# Patient Record
Sex: Female | Born: 1945 | Race: Black or African American | Hispanic: No | Marital: Single | Smoking: Current every day smoker
Health system: Southern US, Community
[De-identification: ages and names within clinical notes are randomized; demographics above are authoritative.]

## PROBLEM LIST (undated history)

## (undated) DIAGNOSIS — E119 Type 2 diabetes mellitus without complications: Secondary | ICD-10-CM

## (undated) DIAGNOSIS — D689 Coagulation defect, unspecified: Secondary | ICD-10-CM

## (undated) DIAGNOSIS — Z87891 Personal history of nicotine dependence: Secondary | ICD-10-CM

## (undated) DIAGNOSIS — D509 Iron deficiency anemia, unspecified: Secondary | ICD-10-CM

## (undated) DIAGNOSIS — I10 Essential (primary) hypertension: Secondary | ICD-10-CM

## (undated) DIAGNOSIS — G5603 Carpal tunnel syndrome, bilateral upper limbs: Secondary | ICD-10-CM

## (undated) DIAGNOSIS — G2581 Restless legs syndrome: Secondary | ICD-10-CM

## (undated) DIAGNOSIS — M48061 Spinal stenosis, lumbar region without neurogenic claudication: Secondary | ICD-10-CM

## (undated) DIAGNOSIS — J45909 Unspecified asthma, uncomplicated: Secondary | ICD-10-CM

## (undated) DIAGNOSIS — I700 Atherosclerosis of aorta: Secondary | ICD-10-CM

## (undated) DIAGNOSIS — E669 Obesity, unspecified: Secondary | ICD-10-CM

## (undated) DIAGNOSIS — K449 Diaphragmatic hernia without obstruction or gangrene: Secondary | ICD-10-CM

## (undated) DIAGNOSIS — M1990 Unspecified osteoarthritis, unspecified site: Secondary | ICD-10-CM

## (undated) DIAGNOSIS — N179 Acute kidney failure, unspecified: Secondary | ICD-10-CM

## (undated) DIAGNOSIS — K5790 Diverticulosis of intestine, part unspecified, without perforation or abscess without bleeding: Secondary | ICD-10-CM

## (undated) DIAGNOSIS — J449 Chronic obstructive pulmonary disease, unspecified: Secondary | ICD-10-CM

## (undated) DIAGNOSIS — M5137 Other intervertebral disc degeneration, lumbosacral region: Secondary | ICD-10-CM

## (undated) DIAGNOSIS — G629 Polyneuropathy, unspecified: Secondary | ICD-10-CM

## (undated) DIAGNOSIS — H409 Unspecified glaucoma: Secondary | ICD-10-CM

## (undated) DIAGNOSIS — F32A Depression, unspecified: Secondary | ICD-10-CM

## (undated) DIAGNOSIS — K219 Gastro-esophageal reflux disease without esophagitis: Secondary | ICD-10-CM

## (undated) DIAGNOSIS — Z8719 Personal history of other diseases of the digestive system: Secondary | ICD-10-CM

## (undated) DIAGNOSIS — G4733 Obstructive sleep apnea (adult) (pediatric): Secondary | ICD-10-CM

## (undated) DIAGNOSIS — K648 Other hemorrhoids: Secondary | ICD-10-CM

## (undated) DIAGNOSIS — F1190 Opioid use, unspecified, uncomplicated: Secondary | ICD-10-CM

## (undated) DIAGNOSIS — D126 Benign neoplasm of colon, unspecified: Secondary | ICD-10-CM

## (undated) DIAGNOSIS — M51379 Other intervertebral disc degeneration, lumbosacral region without mention of lumbar back pain or lower extremity pain: Secondary | ICD-10-CM

## (undated) DIAGNOSIS — I2510 Atherosclerotic heart disease of native coronary artery without angina pectoris: Secondary | ICD-10-CM

## (undated) DIAGNOSIS — G43909 Migraine, unspecified, not intractable, without status migrainosus: Secondary | ICD-10-CM

## (undated) DIAGNOSIS — I444 Left anterior fascicular block: Secondary | ICD-10-CM

## (undated) DIAGNOSIS — K649 Unspecified hemorrhoids: Secondary | ICD-10-CM

## (undated) DIAGNOSIS — E538 Deficiency of other specified B group vitamins: Secondary | ICD-10-CM

## (undated) DIAGNOSIS — I499 Cardiac arrhythmia, unspecified: Secondary | ICD-10-CM

## (undated) DIAGNOSIS — E1142 Type 2 diabetes mellitus with diabetic polyneuropathy: Secondary | ICD-10-CM

## (undated) DIAGNOSIS — B019 Varicella without complication: Secondary | ICD-10-CM

## (undated) DIAGNOSIS — G4700 Insomnia, unspecified: Secondary | ICD-10-CM

## (undated) DIAGNOSIS — K760 Fatty (change of) liver, not elsewhere classified: Secondary | ICD-10-CM

## (undated) DIAGNOSIS — Z9889 Other specified postprocedural states: Secondary | ICD-10-CM

## (undated) DIAGNOSIS — E785 Hyperlipidemia, unspecified: Secondary | ICD-10-CM

## (undated) DIAGNOSIS — Z87442 Personal history of urinary calculi: Secondary | ICD-10-CM

## (undated) DIAGNOSIS — G4730 Sleep apnea, unspecified: Secondary | ICD-10-CM

## (undated) DIAGNOSIS — I4891 Unspecified atrial fibrillation: Secondary | ICD-10-CM

## (undated) DIAGNOSIS — F329 Major depressive disorder, single episode, unspecified: Secondary | ICD-10-CM

## (undated) DIAGNOSIS — R0609 Other forms of dyspnea: Secondary | ICD-10-CM

## (undated) DIAGNOSIS — I6789 Other cerebrovascular disease: Secondary | ICD-10-CM

## (undated) DIAGNOSIS — Z7982 Long term (current) use of aspirin: Secondary | ICD-10-CM

## (undated) HISTORY — PX: ABDOMINAL HYSTERECTOMY: SHX81

## (undated) HISTORY — DX: Opioid use, unspecified, uncomplicated: F11.90

## (undated) HISTORY — PX: JOINT REPLACEMENT: SHX530

## (undated) HISTORY — PX: EYE SURGERY: SHX253

## (undated) HISTORY — PX: LUMBAR FUSION: SHX111

## (undated) HISTORY — DX: Personal history of nicotine dependence: Z87.891

## (undated) HISTORY — PX: COLONOSCOPY WITH ESOPHAGOGASTRODUODENOSCOPY (EGD): SHX5779

## (undated) HISTORY — PX: CHOLECYSTECTOMY: SHX55

## (undated) HISTORY — PX: TOTAL KNEE ARTHROPLASTY: SHX125

## (undated) HISTORY — PX: OTHER SURGICAL HISTORY: SHX169

## (undated) HISTORY — PX: APPENDECTOMY: SHX54

## (undated) HISTORY — PX: CATARACT EXTRACTION: SUR2

## (undated) HISTORY — PX: BACK SURGERY: SHX140

## (undated) HISTORY — PX: CARPAL TUNNEL RELEASE: SHX101

---
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
## 1980-10-06 HISTORY — PX: ABDOMINAL HYSTERECTOMY: SHX81

## 1997-10-06 HISTORY — PX: TOTAL KNEE ARTHROPLASTY: SHX125

## 2000-10-06 HISTORY — PX: BACK SURGERY: SHX140

## 2000-12-28 ENCOUNTER — Encounter: Payer: Self-pay | Admitting: Neurosurgery

## 2000-12-29 ENCOUNTER — Ambulatory Visit: Admission: RE | Admit: 2000-12-29 | Discharge: 2000-12-29 | Payer: Self-pay | Admitting: Neurosurgery

## 2001-02-22 ENCOUNTER — Encounter: Payer: Self-pay | Admitting: Neurosurgery

## 2001-02-22 ENCOUNTER — Inpatient Hospital Stay (HOSPITAL_COMMUNITY): Admission: RE | Admit: 2001-02-22 | Discharge: 2001-02-23 | Payer: Self-pay | Admitting: Neurosurgery

## 2003-09-12 ENCOUNTER — Other Ambulatory Visit: Payer: Self-pay

## 2004-01-15 ENCOUNTER — Other Ambulatory Visit: Payer: Self-pay

## 2004-07-18 ENCOUNTER — Ambulatory Visit: Payer: Self-pay | Admitting: Pain Medicine

## 2004-08-05 ENCOUNTER — Ambulatory Visit: Payer: Self-pay | Admitting: Pain Medicine

## 2004-08-15 ENCOUNTER — Ambulatory Visit: Payer: Self-pay | Admitting: Pain Medicine

## 2004-08-19 ENCOUNTER — Ambulatory Visit: Payer: Self-pay | Admitting: Pain Medicine

## 2004-09-26 ENCOUNTER — Ambulatory Visit: Payer: Self-pay | Admitting: Pain Medicine

## 2004-10-06 HISTORY — PX: CATARACT EXTRACTION: SUR2

## 2004-10-06 HISTORY — PX: LUMBAR FUSION: SHX111

## 2004-10-09 ENCOUNTER — Ambulatory Visit: Payer: Self-pay | Admitting: Pain Medicine

## 2004-10-24 ENCOUNTER — Ambulatory Visit: Payer: Self-pay | Admitting: Pain Medicine

## 2004-11-21 ENCOUNTER — Ambulatory Visit: Payer: Self-pay | Admitting: Pain Medicine

## 2004-11-27 ENCOUNTER — Ambulatory Visit: Payer: Self-pay | Admitting: Pain Medicine

## 2004-12-17 ENCOUNTER — Ambulatory Visit: Payer: Self-pay | Admitting: Pain Medicine

## 2005-01-16 ENCOUNTER — Ambulatory Visit: Payer: Self-pay | Admitting: Pain Medicine

## 2005-01-27 ENCOUNTER — Ambulatory Visit: Payer: Self-pay | Admitting: Pain Medicine

## 2005-02-03 ENCOUNTER — Ambulatory Visit: Payer: Self-pay | Admitting: Physician Assistant

## 2005-02-20 ENCOUNTER — Ambulatory Visit: Payer: Self-pay | Admitting: Pain Medicine

## 2005-02-24 ENCOUNTER — Ambulatory Visit: Payer: Self-pay | Admitting: Pain Medicine

## 2005-03-24 ENCOUNTER — Ambulatory Visit: Payer: Self-pay | Admitting: Pain Medicine

## 2005-04-21 ENCOUNTER — Other Ambulatory Visit: Payer: Self-pay

## 2005-04-22 ENCOUNTER — Ambulatory Visit: Payer: Self-pay | Admitting: Pain Medicine

## 2005-05-20 ENCOUNTER — Ambulatory Visit: Payer: Self-pay | Admitting: Pain Medicine

## 2005-05-28 ENCOUNTER — Inpatient Hospital Stay: Payer: Self-pay | Admitting: Unknown Physician Specialty

## 2005-06-26 ENCOUNTER — Ambulatory Visit: Payer: Self-pay | Admitting: Pain Medicine

## 2005-07-02 ENCOUNTER — Ambulatory Visit: Payer: Self-pay | Admitting: Pain Medicine

## 2005-07-17 ENCOUNTER — Ambulatory Visit: Payer: Self-pay | Admitting: Internal Medicine

## 2005-07-22 ENCOUNTER — Ambulatory Visit: Payer: Self-pay | Admitting: Pain Medicine

## 2005-07-30 ENCOUNTER — Ambulatory Visit: Payer: Self-pay | Admitting: Pain Medicine

## 2005-08-19 ENCOUNTER — Ambulatory Visit: Payer: Self-pay | Admitting: Pain Medicine

## 2005-09-15 ENCOUNTER — Ambulatory Visit: Payer: Self-pay | Admitting: Pain Medicine

## 2005-10-16 ENCOUNTER — Ambulatory Visit: Payer: Self-pay | Admitting: Pain Medicine

## 2005-11-10 ENCOUNTER — Ambulatory Visit: Payer: Self-pay | Admitting: Pain Medicine

## 2005-12-16 ENCOUNTER — Ambulatory Visit: Payer: Self-pay | Admitting: Pain Medicine

## 2006-01-20 ENCOUNTER — Ambulatory Visit: Payer: Self-pay | Admitting: Pain Medicine

## 2006-01-24 ENCOUNTER — Ambulatory Visit: Payer: Self-pay | Admitting: Pain Medicine

## 2006-01-26 ENCOUNTER — Ambulatory Visit: Payer: Self-pay | Admitting: Pain Medicine

## 2006-02-17 ENCOUNTER — Ambulatory Visit: Payer: Self-pay | Admitting: Pain Medicine

## 2006-02-23 ENCOUNTER — Ambulatory Visit: Payer: Self-pay | Admitting: Pain Medicine

## 2006-03-19 ENCOUNTER — Ambulatory Visit: Payer: Self-pay | Admitting: Pain Medicine

## 2006-03-27 ENCOUNTER — Ambulatory Visit: Payer: Self-pay | Admitting: General Surgery

## 2006-04-02 ENCOUNTER — Ambulatory Visit: Payer: Self-pay | Admitting: General Surgery

## 2006-04-12 ENCOUNTER — Emergency Department: Payer: Self-pay | Admitting: Emergency Medicine

## 2006-04-15 ENCOUNTER — Ambulatory Visit: Payer: Self-pay | Admitting: Pain Medicine

## 2006-05-13 ENCOUNTER — Ambulatory Visit: Payer: Self-pay | Admitting: Pain Medicine

## 2006-06-18 ENCOUNTER — Ambulatory Visit: Payer: Self-pay | Admitting: Pain Medicine

## 2006-07-02 ENCOUNTER — Ambulatory Visit: Payer: Self-pay | Admitting: Gastroenterology

## 2006-07-16 ENCOUNTER — Ambulatory Visit: Payer: Self-pay | Admitting: Pain Medicine

## 2006-07-27 ENCOUNTER — Ambulatory Visit: Payer: Self-pay | Admitting: Pain Medicine

## 2006-07-29 ENCOUNTER — Ambulatory Visit: Payer: Self-pay | Admitting: Gastroenterology

## 2006-08-11 ENCOUNTER — Ambulatory Visit: Payer: Self-pay | Admitting: Pain Medicine

## 2006-08-19 ENCOUNTER — Ambulatory Visit: Payer: Self-pay | Admitting: Pain Medicine

## 2006-09-09 ENCOUNTER — Ambulatory Visit: Payer: Self-pay | Admitting: Pain Medicine

## 2006-10-13 ENCOUNTER — Ambulatory Visit: Payer: Self-pay | Admitting: Pain Medicine

## 2006-11-12 ENCOUNTER — Ambulatory Visit: Payer: Self-pay | Admitting: Pain Medicine

## 2006-11-16 ENCOUNTER — Ambulatory Visit: Payer: Self-pay | Admitting: Pain Medicine

## 2006-12-08 ENCOUNTER — Ambulatory Visit: Payer: Self-pay | Admitting: Pain Medicine

## 2006-12-16 ENCOUNTER — Ambulatory Visit: Payer: Self-pay | Admitting: Pain Medicine

## 2006-12-31 ENCOUNTER — Ambulatory Visit: Payer: Self-pay | Admitting: Pain Medicine

## 2007-01-11 ENCOUNTER — Ambulatory Visit: Payer: Self-pay | Admitting: Pain Medicine

## 2007-01-28 ENCOUNTER — Ambulatory Visit: Payer: Self-pay | Admitting: Pain Medicine

## 2007-02-01 ENCOUNTER — Ambulatory Visit: Payer: Self-pay | Admitting: Pain Medicine

## 2007-03-02 ENCOUNTER — Ambulatory Visit: Payer: Self-pay | Admitting: Pain Medicine

## 2007-03-10 ENCOUNTER — Ambulatory Visit: Payer: Self-pay | Admitting: Pain Medicine

## 2007-04-01 ENCOUNTER — Ambulatory Visit: Payer: Self-pay | Admitting: Pain Medicine

## 2007-04-07 ENCOUNTER — Ambulatory Visit: Payer: Self-pay | Admitting: Pain Medicine

## 2007-05-05 ENCOUNTER — Ambulatory Visit: Payer: Self-pay | Admitting: Pain Medicine

## 2007-05-10 ENCOUNTER — Ambulatory Visit: Payer: Self-pay | Admitting: Pain Medicine

## 2007-05-17 ENCOUNTER — Ambulatory Visit: Payer: Self-pay | Admitting: Unknown Physician Specialty

## 2007-06-03 ENCOUNTER — Ambulatory Visit: Payer: Self-pay | Admitting: Pain Medicine

## 2007-06-18 ENCOUNTER — Ambulatory Visit: Payer: Self-pay | Admitting: Pain Medicine

## 2007-07-06 ENCOUNTER — Other Ambulatory Visit: Payer: Self-pay

## 2007-07-06 ENCOUNTER — Ambulatory Visit: Payer: Self-pay | Admitting: Unknown Physician Specialty

## 2007-07-07 ENCOUNTER — Ambulatory Visit: Payer: Self-pay | Admitting: Pain Medicine

## 2007-07-12 ENCOUNTER — Inpatient Hospital Stay: Payer: Self-pay | Admitting: Unknown Physician Specialty

## 2007-08-04 ENCOUNTER — Ambulatory Visit: Payer: Self-pay | Admitting: Pain Medicine

## 2007-08-30 ENCOUNTER — Ambulatory Visit: Payer: Self-pay | Admitting: Pain Medicine

## 2007-09-27 ENCOUNTER — Ambulatory Visit: Payer: Self-pay | Admitting: Pain Medicine

## 2007-11-04 ENCOUNTER — Ambulatory Visit: Payer: Self-pay | Admitting: Pain Medicine

## 2007-12-01 ENCOUNTER — Ambulatory Visit: Payer: Self-pay | Admitting: Pain Medicine

## 2007-12-14 ENCOUNTER — Ambulatory Visit: Payer: Self-pay

## 2007-12-23 ENCOUNTER — Ambulatory Visit: Payer: Self-pay | Admitting: Pain Medicine

## 2007-12-29 ENCOUNTER — Ambulatory Visit: Payer: Self-pay | Admitting: Pain Medicine

## 2008-02-24 ENCOUNTER — Ambulatory Visit: Payer: Self-pay | Admitting: Pain Medicine

## 2008-03-01 ENCOUNTER — Ambulatory Visit: Payer: Self-pay | Admitting: Pain Medicine

## 2008-03-23 ENCOUNTER — Ambulatory Visit: Payer: Self-pay | Admitting: Pain Medicine

## 2008-03-27 ENCOUNTER — Ambulatory Visit: Payer: Self-pay | Admitting: Pain Medicine

## 2008-04-25 ENCOUNTER — Ambulatory Visit: Payer: Self-pay | Admitting: Pain Medicine

## 2008-05-03 ENCOUNTER — Ambulatory Visit: Payer: Self-pay | Admitting: Pain Medicine

## 2008-05-25 ENCOUNTER — Ambulatory Visit: Payer: Self-pay | Admitting: Pain Medicine

## 2008-05-31 ENCOUNTER — Ambulatory Visit: Payer: Self-pay | Admitting: Pain Medicine

## 2008-06-22 ENCOUNTER — Ambulatory Visit: Payer: Self-pay | Admitting: Pain Medicine

## 2008-07-19 ENCOUNTER — Ambulatory Visit: Payer: Self-pay | Admitting: Pain Medicine

## 2008-08-22 ENCOUNTER — Ambulatory Visit: Payer: Self-pay | Admitting: Pain Medicine

## 2008-08-28 ENCOUNTER — Ambulatory Visit: Payer: Self-pay | Admitting: Pain Medicine

## 2008-09-19 ENCOUNTER — Ambulatory Visit: Payer: Self-pay | Admitting: Family Medicine

## 2008-09-21 ENCOUNTER — Ambulatory Visit: Payer: Self-pay | Admitting: Pain Medicine

## 2008-10-24 ENCOUNTER — Ambulatory Visit: Payer: Self-pay | Admitting: Pain Medicine

## 2008-10-30 ENCOUNTER — Ambulatory Visit: Payer: Self-pay | Admitting: Pain Medicine

## 2008-11-28 ENCOUNTER — Ambulatory Visit: Payer: Self-pay | Admitting: Pain Medicine

## 2008-12-28 ENCOUNTER — Ambulatory Visit: Payer: Self-pay | Admitting: Pain Medicine

## 2009-01-30 ENCOUNTER — Ambulatory Visit: Payer: Self-pay | Admitting: Pain Medicine

## 2009-02-12 ENCOUNTER — Ambulatory Visit: Payer: Self-pay | Admitting: Pain Medicine

## 2009-03-01 ENCOUNTER — Ambulatory Visit: Payer: Self-pay | Admitting: Pain Medicine

## 2009-03-27 ENCOUNTER — Ambulatory Visit: Payer: Self-pay | Admitting: Pain Medicine

## 2009-04-04 ENCOUNTER — Ambulatory Visit: Payer: Self-pay | Admitting: Pain Medicine

## 2009-04-24 ENCOUNTER — Ambulatory Visit: Payer: Self-pay | Admitting: Pain Medicine

## 2009-05-29 ENCOUNTER — Ambulatory Visit: Payer: Self-pay | Admitting: Pain Medicine

## 2009-06-28 ENCOUNTER — Ambulatory Visit: Payer: Self-pay | Admitting: Pain Medicine

## 2009-07-04 ENCOUNTER — Ambulatory Visit: Payer: Self-pay | Admitting: Pain Medicine

## 2009-07-24 ENCOUNTER — Ambulatory Visit: Payer: Self-pay | Admitting: Pain Medicine

## 2009-08-20 ENCOUNTER — Ambulatory Visit: Payer: Self-pay | Admitting: Pain Medicine

## 2009-09-20 ENCOUNTER — Ambulatory Visit: Payer: Self-pay | Admitting: Pain Medicine

## 2009-09-24 ENCOUNTER — Ambulatory Visit: Payer: Self-pay | Admitting: Pain Medicine

## 2009-10-23 ENCOUNTER — Ambulatory Visit: Payer: Self-pay | Admitting: Pain Medicine

## 2009-11-22 ENCOUNTER — Ambulatory Visit: Payer: Self-pay | Admitting: Pain Medicine

## 2009-11-28 ENCOUNTER — Ambulatory Visit: Payer: Self-pay | Admitting: Pain Medicine

## 2009-12-20 ENCOUNTER — Ambulatory Visit: Payer: Self-pay | Admitting: Pain Medicine

## 2010-01-17 ENCOUNTER — Ambulatory Visit: Payer: Self-pay | Admitting: Pain Medicine

## 2010-01-30 ENCOUNTER — Ambulatory Visit: Payer: Self-pay | Admitting: Pain Medicine

## 2010-02-14 ENCOUNTER — Ambulatory Visit: Payer: Self-pay | Admitting: Pain Medicine

## 2010-03-14 ENCOUNTER — Ambulatory Visit: Payer: Self-pay | Admitting: Pain Medicine

## 2010-04-16 ENCOUNTER — Ambulatory Visit: Payer: Self-pay | Admitting: Pain Medicine

## 2010-04-22 ENCOUNTER — Ambulatory Visit: Payer: Self-pay | Admitting: Pain Medicine

## 2010-05-16 ENCOUNTER — Ambulatory Visit: Payer: Self-pay | Admitting: Pain Medicine

## 2010-06-11 ENCOUNTER — Ambulatory Visit: Payer: Self-pay | Admitting: Pain Medicine

## 2010-06-19 ENCOUNTER — Ambulatory Visit: Payer: Self-pay | Admitting: Pain Medicine

## 2010-07-04 ENCOUNTER — Ambulatory Visit: Payer: Self-pay | Admitting: Pain Medicine

## 2010-08-13 ENCOUNTER — Ambulatory Visit: Payer: Self-pay | Admitting: Pain Medicine

## 2010-08-21 ENCOUNTER — Ambulatory Visit: Payer: Self-pay | Admitting: Pain Medicine

## 2010-09-10 ENCOUNTER — Ambulatory Visit: Payer: Self-pay | Admitting: Pain Medicine

## 2010-09-15 ENCOUNTER — Ambulatory Visit: Payer: Self-pay | Admitting: Pain Medicine

## 2010-10-14 ENCOUNTER — Ambulatory Visit: Payer: Self-pay | Admitting: Pain Medicine

## 2010-10-30 ENCOUNTER — Ambulatory Visit: Payer: Self-pay | Admitting: Pain Medicine

## 2010-12-10 ENCOUNTER — Ambulatory Visit: Payer: Self-pay | Admitting: Pain Medicine

## 2011-01-09 ENCOUNTER — Ambulatory Visit: Payer: Self-pay | Admitting: Pain Medicine

## 2011-02-11 ENCOUNTER — Ambulatory Visit: Payer: Self-pay | Admitting: Pain Medicine

## 2011-02-24 ENCOUNTER — Ambulatory Visit: Payer: Self-pay | Admitting: Emergency Medicine

## 2011-02-25 ENCOUNTER — Ambulatory Visit: Payer: Self-pay | Admitting: Emergency Medicine

## 2011-02-26 ENCOUNTER — Ambulatory Visit: Payer: Self-pay | Admitting: Emergency Medicine

## 2011-03-11 ENCOUNTER — Ambulatory Visit: Payer: Self-pay | Admitting: Pain Medicine

## 2011-03-23 ENCOUNTER — Emergency Department: Payer: Self-pay | Admitting: Emergency Medicine

## 2011-03-29 ENCOUNTER — Emergency Department: Payer: Self-pay | Admitting: Internal Medicine

## 2011-04-08 ENCOUNTER — Ambulatory Visit: Payer: Self-pay | Admitting: Pain Medicine

## 2011-05-08 ENCOUNTER — Ambulatory Visit: Payer: Self-pay | Admitting: Pain Medicine

## 2011-06-05 ENCOUNTER — Ambulatory Visit: Payer: Self-pay | Admitting: Pain Medicine

## 2011-07-08 ENCOUNTER — Ambulatory Visit: Payer: Self-pay | Admitting: Pain Medicine

## 2011-08-07 ENCOUNTER — Ambulatory Visit: Payer: Self-pay | Admitting: Pain Medicine

## 2011-08-20 ENCOUNTER — Ambulatory Visit: Payer: Self-pay | Admitting: Cardiovascular Disease

## 2011-09-09 ENCOUNTER — Ambulatory Visit: Payer: Self-pay | Admitting: Pain Medicine

## 2011-09-17 ENCOUNTER — Ambulatory Visit: Payer: Self-pay | Admitting: Pain Medicine

## 2011-10-09 ENCOUNTER — Ambulatory Visit: Payer: Self-pay | Admitting: Pain Medicine

## 2011-11-04 ENCOUNTER — Ambulatory Visit: Payer: Self-pay | Admitting: Pain Medicine

## 2011-12-02 ENCOUNTER — Ambulatory Visit: Payer: Self-pay | Admitting: Pain Medicine

## 2011-12-30 ENCOUNTER — Ambulatory Visit: Payer: Self-pay | Admitting: Pain Medicine

## 2012-02-03 ENCOUNTER — Ambulatory Visit: Payer: Self-pay | Admitting: Pain Medicine

## 2012-02-11 ENCOUNTER — Ambulatory Visit: Payer: Self-pay | Admitting: Pain Medicine

## 2012-03-04 ENCOUNTER — Ambulatory Visit: Payer: Self-pay | Admitting: Pain Medicine

## 2012-03-30 ENCOUNTER — Ambulatory Visit: Payer: Self-pay | Admitting: Pain Medicine

## 2012-04-29 ENCOUNTER — Ambulatory Visit: Payer: Self-pay | Admitting: Pain Medicine

## 2012-06-01 ENCOUNTER — Ambulatory Visit: Payer: Self-pay | Admitting: Pain Medicine

## 2012-06-14 ENCOUNTER — Ambulatory Visit: Payer: Self-pay | Admitting: Pain Medicine

## 2012-06-30 ENCOUNTER — Ambulatory Visit: Payer: Self-pay | Admitting: Pain Medicine

## 2012-08-03 ENCOUNTER — Ambulatory Visit: Payer: Self-pay | Admitting: Pain Medicine

## 2012-08-31 ENCOUNTER — Ambulatory Visit: Payer: Self-pay | Admitting: Pain Medicine

## 2012-09-27 ENCOUNTER — Ambulatory Visit: Payer: Self-pay | Admitting: Pain Medicine

## 2012-10-26 ENCOUNTER — Ambulatory Visit: Payer: Self-pay | Admitting: Pain Medicine

## 2012-11-29 ENCOUNTER — Ambulatory Visit: Payer: Self-pay | Admitting: Pain Medicine

## 2012-12-15 ENCOUNTER — Ambulatory Visit: Payer: Self-pay | Admitting: Pain Medicine

## 2013-01-27 ENCOUNTER — Ambulatory Visit: Payer: Self-pay | Admitting: Pain Medicine

## 2013-03-01 ENCOUNTER — Ambulatory Visit: Payer: Self-pay | Admitting: Pain Medicine

## 2013-03-31 ENCOUNTER — Ambulatory Visit: Payer: Self-pay | Admitting: Pain Medicine

## 2013-04-28 ENCOUNTER — Ambulatory Visit: Payer: Self-pay | Admitting: Pain Medicine

## 2013-05-26 ENCOUNTER — Ambulatory Visit: Payer: Self-pay | Admitting: Pain Medicine

## 2013-06-28 ENCOUNTER — Ambulatory Visit: Payer: Self-pay | Admitting: Pain Medicine

## 2013-07-06 ENCOUNTER — Ambulatory Visit: Payer: Self-pay | Admitting: Gastroenterology

## 2013-07-14 ENCOUNTER — Ambulatory Visit: Payer: Self-pay | Admitting: Gastroenterology

## 2013-07-21 ENCOUNTER — Ambulatory Visit: Payer: Self-pay | Admitting: Oncology

## 2013-08-01 LAB — CBC CANCER CENTER
Basophil #: 0.1 x10 3/mm (ref 0.0–0.1)
Basophil %: 0.9 %
Eosinophil #: 0.2 x10 3/mm (ref 0.0–0.7)
Eosinophil %: 2.2 %
HCT: 34.2 % — ABNORMAL LOW (ref 35.0–47.0)
HGB: 11.1 g/dL — ABNORMAL LOW (ref 12.0–16.0)
Lymphocyte #: 2.2 x10 3/mm (ref 1.0–3.6)
Lymphocyte %: 29.5 %
MCH: 25.3 pg — ABNORMAL LOW (ref 26.0–34.0)
MCHC: 32.3 g/dL (ref 32.0–36.0)
MCV: 78 fL — ABNORMAL LOW (ref 80–100)
Monocyte #: 0.4 x10 3/mm (ref 0.2–0.9)
Monocyte %: 5 %
Neutrophil #: 4.7 x10 3/mm (ref 1.4–6.5)
Neutrophil %: 62.4 %
Platelet: 243 x10 3/mm (ref 150–440)
RBC: 4.38 10*6/uL (ref 3.80–5.20)
RDW: 15.4 % — ABNORMAL HIGH (ref 11.5–14.5)
WBC: 7.6 x10 3/mm (ref 3.6–11.0)

## 2013-08-01 LAB — COMPREHENSIVE METABOLIC PANEL
Albumin: 2.9 g/dL — ABNORMAL LOW (ref 3.4–5.0)
Alkaline Phosphatase: 102 U/L (ref 50–136)
Anion Gap: 6 — ABNORMAL LOW (ref 7–16)
BUN: 4 mg/dL — ABNORMAL LOW (ref 7–18)
Bilirubin,Total: 0.5 mg/dL (ref 0.2–1.0)
Calcium, Total: 8.3 mg/dL — ABNORMAL LOW (ref 8.5–10.1)
Chloride: 104 mmol/L (ref 98–107)
Co2: 30 mmol/L (ref 21–32)
Creatinine: 0.78 mg/dL (ref 0.60–1.30)
EGFR (African American): >60
EGFR (Non-African Amer.): >60
Glucose: 119 mg/dL — ABNORMAL HIGH (ref 65–99)
Osmolality: 277 (ref 275–301)
Potassium: 3.6 mmol/L (ref 3.5–5.1)
SGOT(AST): 14 U/L — ABNORMAL LOW (ref 15–37)
SGPT (ALT): 8 U/L — ABNORMAL LOW (ref 12–78)
Sodium: 140 mmol/L (ref 136–145)
Total Protein: 7.1 g/dL (ref 6.4–8.2)

## 2013-08-02 LAB — CEA: CEA: 3.7 ng/mL (ref 0.0–4.7)

## 2013-08-06 ENCOUNTER — Ambulatory Visit: Payer: Self-pay | Admitting: Oncology

## 2013-08-18 ENCOUNTER — Ambulatory Visit: Payer: Self-pay | Admitting: Gastroenterology

## 2013-08-23 LAB — PATHOLOGY REPORT

## 2013-09-05 ENCOUNTER — Ambulatory Visit: Payer: Self-pay | Admitting: Oncology

## 2013-10-06 ENCOUNTER — Ambulatory Visit: Payer: Self-pay | Admitting: Oncology

## 2013-12-12 ENCOUNTER — Encounter: Payer: Self-pay | Admitting: Anesthesiology

## 2013-12-13 ENCOUNTER — Ambulatory Visit: Payer: Self-pay | Admitting: Gastroenterology

## 2013-12-15 LAB — PATHOLOGY REPORT

## 2014-01-04 ENCOUNTER — Encounter: Payer: Self-pay | Admitting: Anesthesiology

## 2014-02-03 ENCOUNTER — Encounter: Payer: Self-pay | Admitting: Anesthesiology

## 2014-03-09 ENCOUNTER — Ambulatory Visit: Payer: Self-pay | Admitting: Oncology

## 2014-03-10 DIAGNOSIS — Z9889 Other specified postprocedural states: Secondary | ICD-10-CM | POA: Insufficient documentation

## 2014-03-10 DIAGNOSIS — M19011 Primary osteoarthritis, right shoulder: Secondary | ICD-10-CM | POA: Insufficient documentation

## 2014-03-10 DIAGNOSIS — M7580 Other shoulder lesions, unspecified shoulder: Secondary | ICD-10-CM | POA: Insufficient documentation

## 2014-03-10 DIAGNOSIS — M19012 Primary osteoarthritis, left shoulder: Secondary | ICD-10-CM

## 2014-03-27 ENCOUNTER — Ambulatory Visit: Payer: Self-pay | Admitting: Family Medicine

## 2014-04-05 ENCOUNTER — Ambulatory Visit: Payer: Self-pay | Admitting: Oncology

## 2014-05-04 ENCOUNTER — Ambulatory Visit: Payer: Self-pay | Admitting: Gastroenterology

## 2014-05-06 LAB — PATHOLOGY REPORT

## 2014-06-20 ENCOUNTER — Ambulatory Visit: Payer: Self-pay | Admitting: Oncology

## 2014-07-06 ENCOUNTER — Ambulatory Visit: Payer: Self-pay | Admitting: Oncology

## 2014-07-06 ENCOUNTER — Ambulatory Visit: Payer: Self-pay | Admitting: Gastroenterology

## 2014-09-21 ENCOUNTER — Ambulatory Visit: Payer: Self-pay | Admitting: Oncology

## 2014-09-21 LAB — COMPREHENSIVE METABOLIC PANEL
Albumin: 3 g/dL — ABNORMAL LOW (ref 3.4–5.0)
Alkaline Phosphatase: 100 U/L
Anion Gap: 8 (ref 7–16)
BUN: 5 mg/dL — ABNORMAL LOW (ref 7–18)
Bilirubin,Total: 0.5 mg/dL (ref 0.2–1.0)
Calcium, Total: 8.7 mg/dL (ref 8.5–10.1)
Chloride: 99 mmol/L (ref 98–107)
Co2: 30 mmol/L (ref 21–32)
Creatinine: 0.82 mg/dL (ref 0.60–1.30)
EGFR (African American): >60
EGFR (Non-African Amer.): >60
Glucose: 133 mg/dL — ABNORMAL HIGH (ref 65–99)
Osmolality: 273 (ref 275–301)
Potassium: 3.3 mmol/L — ABNORMAL LOW (ref 3.5–5.1)
SGOT(AST): 10 U/L — ABNORMAL LOW (ref 15–37)
SGPT (ALT): 15 U/L
Sodium: 137 mmol/L (ref 136–145)
Total Protein: 7.5 g/dL (ref 6.4–8.2)

## 2014-09-21 LAB — CBC CANCER CENTER
Basophil #: 0.1 x10 3/mm (ref 0.0–0.1)
Basophil %: 0.6 %
Eosinophil #: 0.2 x10 3/mm (ref 0.0–0.7)
Eosinophil %: 2.2 %
HCT: 36.5 % (ref 35.0–47.0)
HGB: 11.8 g/dL — ABNORMAL LOW (ref 12.0–16.0)
Lymphocyte #: 2.8 x10 3/mm (ref 1.0–3.6)
Lymphocyte %: 28.8 %
MCH: 25.5 pg — ABNORMAL LOW (ref 26.0–34.0)
MCHC: 32.3 g/dL (ref 32.0–36.0)
MCV: 79 fL — ABNORMAL LOW (ref 80–100)
Monocyte #: 0.7 x10 3/mm (ref 0.2–0.9)
Monocyte %: 7 %
Neutrophil #: 6 x10 3/mm (ref 1.4–6.5)
Neutrophil %: 61.4 %
Platelet: 287 x10 3/mm (ref 150–440)
RBC: 4.62 10*6/uL (ref 3.80–5.20)
RDW: 15 % — ABNORMAL HIGH (ref 11.5–14.5)
WBC: 9.8 x10 3/mm (ref 3.6–11.0)

## 2014-10-06 ENCOUNTER — Ambulatory Visit: Payer: Self-pay | Admitting: Oncology

## 2014-12-25 ENCOUNTER — Ambulatory Visit: Payer: Self-pay | Admitting: Gastroenterology

## 2014-12-25 DIAGNOSIS — K648 Other hemorrhoids: Secondary | ICD-10-CM

## 2014-12-25 HISTORY — DX: Other hemorrhoids: K64.8

## 2015-01-29 LAB — SURGICAL PATHOLOGY

## 2015-06-08 ENCOUNTER — Telehealth: Payer: Self-pay

## 2015-06-08 NOTE — Telephone Encounter (Signed)
Navigator Encounter Type: Telephone, Screening  Notified patient that annual lung cancer screening low dose CT scan is due. Confirmed that patient is within the age range 55-77, asymptomatic. The patient is a smoker with a 51 pkyear history. Shared Decision Making visit was done 03/27/2014. Patient is agreeable to scheduling of CT scan.

## 2015-06-18 ENCOUNTER — Encounter: Payer: Self-pay | Admitting: Family Medicine

## 2015-06-18 ENCOUNTER — Other Ambulatory Visit: Payer: Self-pay | Admitting: Family Medicine

## 2015-06-18 DIAGNOSIS — Z87891 Personal history of nicotine dependence: Secondary | ICD-10-CM | POA: Insufficient documentation

## 2015-06-18 HISTORY — DX: Personal history of nicotine dependence: Z87.891

## 2015-06-21 ENCOUNTER — Ambulatory Visit: Admission: RE | Admit: 2015-06-21 | Payer: Medicare Other | Source: Ambulatory Visit

## 2015-06-24 ENCOUNTER — Emergency Department
Admission: EM | Admit: 2015-06-24 | Discharge: 2015-06-24 | Disposition: A | Discharge status: Home / Self Care | Payer: Medicare Other | Attending: Emergency Medicine | Admitting: Emergency Medicine

## 2015-06-24 ENCOUNTER — Emergency Department: Payer: Medicare Other

## 2015-06-24 DIAGNOSIS — M549 Dorsalgia, unspecified: Secondary | ICD-10-CM | POA: Diagnosis not present

## 2015-06-24 DIAGNOSIS — Z87891 Personal history of nicotine dependence: Secondary | ICD-10-CM | POA: Diagnosis not present

## 2015-06-24 DIAGNOSIS — G43011 Migraine without aura, intractable, with status migrainosus: Secondary | ICD-10-CM | POA: Diagnosis not present

## 2015-06-24 DIAGNOSIS — R51 Headache: Secondary | ICD-10-CM | POA: Diagnosis present

## 2015-06-24 LAB — BASIC METABOLIC PANEL
Anion gap: 5 (ref 5–15)
BUN: 5 mg/dL — AB (ref 6–20)
CALCIUM: 8.7 mg/dL — AB (ref 8.9–10.3)
CO2: 28 mmol/L (ref 22–32)
CREATININE: 0.63 mg/dL (ref 0.44–1.00)
Chloride: 106 mmol/L (ref 101–111)
GFR calc non Af Amer: >60 mL/min (ref >60)
GLUCOSE: 110 mg/dL — AB (ref 65–99)
Potassium: 4 mmol/L (ref 3.5–5.1)
Sodium: 139 mmol/L (ref 135–145)

## 2015-06-24 LAB — CBC
HCT: 32.4 % — ABNORMAL LOW (ref 35.0–47.0)
Hemoglobin: 10.4 g/dL — ABNORMAL LOW (ref 12.0–16.0)
MCH: 24.1 pg — AB (ref 26.0–34.0)
MCHC: 32 g/dL (ref 32.0–36.0)
MCV: 75.2 fL — ABNORMAL LOW (ref 80.0–100.0)
PLATELETS: 248 10*3/uL (ref 150–440)
RBC: 4.31 MIL/uL (ref 3.80–5.20)
RDW: 16.9 % — AB (ref 11.5–14.5)
WBC: 8.1 10*3/uL (ref 3.6–11.0)

## 2015-06-24 LAB — TROPONIN I

## 2015-06-24 MED ORDER — KETOROLAC TROMETHAMINE 30 MG/ML IJ SOLN
30.0000 mg | Freq: Once | INTRAMUSCULAR | Status: AC
  Administered 2015-06-24: 30 mg via INTRAVENOUS
  Filled 2015-06-24: qty 1

## 2015-06-24 MED ORDER — DEXAMETHASONE SODIUM PHOSPHATE 10 MG/ML IJ SOLN
10.0000 mg | Freq: Once | INTRAMUSCULAR | Status: AC
  Administered 2015-06-24: 10 mg via INTRAVENOUS
  Filled 2015-06-24: qty 1

## 2015-06-24 MED ORDER — METOCLOPRAMIDE HCL 5 MG/ML IJ SOLN
10.0000 mg | Freq: Once | INTRAMUSCULAR | Status: AC
  Administered 2015-06-24: 10 mg via INTRAVENOUS
  Filled 2015-06-24: qty 2

## 2015-06-24 MED ORDER — MAGNESIUM SULFATE 2 GM/50ML IV SOLN
2.0000 g | Freq: Once | INTRAVENOUS | Status: AC
  Administered 2015-06-24: 2 g via INTRAVENOUS
  Filled 2015-06-24: qty 50

## 2015-06-24 MED ORDER — SODIUM CHLORIDE 0.9 % IV BOLUS (SEPSIS)
1000.0000 mL | Freq: Once | INTRAVENOUS | Status: AC
  Administered 2015-06-24: 1000 mL via INTRAVENOUS

## 2015-06-24 NOTE — ED Provider Notes (Signed)
[facility] Emergency Department Provider Note  ____________________________________________  Time seen: 1320  I have reviewed the triage vital signs and the nursing notes.   HISTORY  Chief Complaint Headache and Dizziness     HPI Lynn Potter is a 69 y.o. female with a long history of headaches who presents to the emergency department with ongoing headache, acutely worse last night.   She reports this headache has been intermittent over the past week or so, but became acutely worse last night at approximately 7:00. The pain is primarily in the left head and left face. She feels she has some numbness or paresthesia to the left face.  She also reports some dizziness this morning leading to a fall.  She denies any focal neurologic weakness. She does report that she has had problems, musculoskeletal, on her left side. Attempting to raise her left leg causes her some discomfort, but this is not new and does not appear to be neurological.  She used to receive Botox injections to try to treat the migraine headaches but found the not helpful and stopped receiving them.        Past Medical History  Diagnosis Date  . Personal history of tobacco use, presenting hazards to health 06/18/2015    Patient Active Problem List   Diagnosis Date Noted  . Personal history of tobacco use, presenting hazards to health 06/18/2015    No past surgical history on file.  No current outpatient prescriptions on file.  Allergies Review of patient's allergies indicates no known allergies.  No family history on file.  Social History Social History  Substance Use Topics  . Smoking status: Not on file  . Smokeless tobacco: Not on file  . Alcohol Use: Not on file    Review of Systems  Constitutional: Negative for fever. ENT: Negative for sore throat. Cardiovascular: Negative for chest pain. Respiratory: Negative for shortness of breath. Gastrointestinal: Negative  for abdominal pain, vomiting and diarrhea. Genitourinary: Negative for dysuria. Musculoskeletal: No myalgias or injuries. Skin: Negative for rash. Neurological: positive for long history of migraine headaches. Current headache-see history of present illness   10-point ROS otherwise negative.  ____________________________________________   PHYSICAL EXAM:  VITAL SIGNS: ED Triage Vitals  Enc Vitals Group     BP 06/24/15 1314 156/78 mmHg     Pulse Rate 06/24/15 1314 74     Resp 06/24/15 1314 18     Temp 06/24/15 1314 98.9 F (37.2 C)     Temp Source 06/24/15 1314 Oral     SpO2 06/24/15 1314 96 %     Weight 06/24/15 1314 212 lb (96.163 kg)     Height 06/24/15 1314 5\' 4"  (1.626 m)     Head Cir --      Peak Flow --      Pain Score 06/24/15 1315 10     Pain Loc --      Pain Edu? --      Excl. in [place]? --     Constitutional: Alert and oriented. Appears low lung comfortable but in no acute distress. ENT   Head: Normocephalic and atraumatic.   Nose: No congestion/rhinnorhea.   Mouth/Throat: Mucous membranes are moist. Cardiovascular: Normal rate, regular rhythm, no murmur noted Respiratory:  Normal respiratory effort, no tachypnea.    Breath sounds are clear and equal bilaterally.  Gastrointestinal: Soft and nontender. No distention.  Back: No muscle spasm, mild tenderness in her left back., no CVA tenderness. Musculoskeletal: No deformity noted. Patient  does complain of some discomfort when she raises her left leg. She also has some mild tenderness in her left back..  No noted edema. Neurologic:  Normal speech and language. Equal grip strength, negative pronator drift, negative Romberg, good finger to nose coordination, 5 over 5 strength in all 4 extremities.No gross focal neurologic deficits are appreciated.  Skin:  Skin is warm, dry. No rash noted. Psychiatric: Mood and affect are normal. Speech and behavior are normal.  ____________________________________________     LABS (pertinent positives/negatives)  Labs Reviewed  BASIC METABOLIC PANEL - Abnormal; Notable for the following:    Glucose, Bld 110 (*)    BUN 5 (*)    Calcium 8.7 (*)    All other components within normal limits  CBC - Abnormal; Notable for the following:    Hemoglobin 10.4 (*)    HCT 32.4 (*)    MCV 75.2 (*)    MCH 24.1 (*)    RDW 16.9 (*)    All other components within normal limits  TROPONIN I     ____________________________________________   EKG  ED ECG REPORT I, KAMINSKI,DAVID W, the attending physician, personally viewed and interpreted this ECG.   Date: 06/24/2015  EKG Time: 1324  Rate: 72  Rhythm: Normal sinus rhythm  Axis: left axis deviation  Intervals: Normal  ST&T Change: downward T-wave in V2 through V6, lead 3, and aVF.  This is unchanged since 07/06/2007. ____________________________________________    RADIOLOGY  CT head IMPRESSION: No acute intracranial abnormality. No significant change. ____________________________________________  INITIAL IMPRESSION / ASSESSMENT AND PLAN / ED COURSE  Pertinent labs & imaging results that were available during my care of the patient were reviewed by me and considered in my medical decision making (see chart for details).  Pleasant, alert, 69 year old female with a headache. She has a history of headaches but this appears atypical for her. We'll obtain a head CT. Nurses have aren't ordered blood tests, including a troponin. The patient has not complained of chest pain to me and appears to be low risk for cardiac event.  We will treat her with antimigraine medicines with the remainder of the workup pending.  ----------------------------------------- 2:39 PM on 06/24/2015 -----------------------------------------  Head CT is negative. Patient has been treated with Reglan and magnesium IV. Reassessment at this time finds her more comfortable. She says her headache is easing. We will treat her with  Toradol and Decadron to continue to treat this migraine. ____________________________________________   FINAL CLINICAL IMPRESSION(S) / ED DIAGNOSES  Final diagnoses:  Intractable migraine without aura and with status migrainosus      Ahmed Prima, MD 06/24/15 1454

## 2015-06-24 NOTE — ED Notes (Signed)
Pt states headaches for several weeks but increasing in pain this AM, left sided, blurry vision in left eye, and dizziness, awake and alert, states she fell this AM due to dizziness, began last night at [facility]

## 2015-06-24 NOTE — Discharge Instructions (Signed)
Your CT scan was okay. You were treated with antimigraine medications, including Reglan, magnesium, Toradol, and Decadron. Return to the emergency department if your headache worsens again or if you have any focal weakness or other urgent concerns. Follow with her regular doctor for ongoing migraine headache care and for further evaluation.  Migraine Headache A migraine headache is very bad, throbbing pain on one or both sides of your head. Talk to your doctor about what things may bring on (trigger) your migraine headaches. HOME CARE  Only take medicines as told by your doctor.  Lie down in a dark, quiet room when you have a migraine.  Keep a journal to find out if certain things bring on migraine headaches. For example, write down:  What you eat and drink.  How much sleep you get.  Any change to your diet or medicines.  Lessen how much alcohol you drink.  Quit smoking if you smoke.  Get enough sleep.  Lessen any stress in your life.  Keep lights dim if bright lights bother you or make your migraines worse. GET HELP RIGHT AWAY IF:   Your migraine becomes really bad.  You have a fever.  You have a stiff neck.  You have trouble seeing.  Your muscles are weak, or you lose muscle control.  You lose your balance or have trouble walking.  You feel like you will pass out (faint), or you pass out.  You have really bad symptoms that are different than your first symptoms. MAKE SURE YOU:   Understand these instructions.  Will watch your condition.  Will get help right away if you are not doing well or get worse. Document Released: 07/01/2008 Document Revised: 12/15/2011 Document Reviewed: 05/30/2013 [facility] Patient Information 2015 [place], [place]. This information is not intended to replace advice given to you by your health care provider. Make sure you discuss any questions you have with your health care provider.

## 2015-06-24 NOTE — ED Notes (Signed)
Patient transported to CT 

## 2015-09-20 ENCOUNTER — Other Ambulatory Visit: Payer: Self-pay | Admitting: *Deleted

## 2015-09-20 DIAGNOSIS — D3A8 Other benign neuroendocrine tumors: Secondary | ICD-10-CM

## 2015-09-24 ENCOUNTER — Telehealth: Payer: Self-pay | Admitting: *Deleted

## 2015-09-24 NOTE — Telephone Encounter (Signed)
Notified patient that annual lung cancer screening low dose CT scan is due. Confirmed that patient is within the age range of 55-77, and asymptomatic, (no signs or symptoms of lung cancer). The patient is a current smoker, with a 51 pack year history. The shared decision making visit was done 03/27/14 Patient is agreeable for CT scan being scheduled.

## 2015-09-25 ENCOUNTER — Inpatient Hospital Stay: Payer: Medicare Other | Attending: Oncology

## 2015-09-25 ENCOUNTER — Inpatient Hospital Stay: Payer: Medicare Other | Admitting: Oncology

## 2015-10-17 DIAGNOSIS — M19011 Primary osteoarthritis, right shoulder: Secondary | ICD-10-CM | POA: Insufficient documentation

## 2015-11-05 ENCOUNTER — Ambulatory Visit
Admission: RE | Admit: 2015-11-05 | Discharge: 2015-11-05 | Disposition: A | Discharge status: Home / Self Care | Payer: Medicare Other | Source: Ambulatory Visit | Attending: Family Medicine | Admitting: Family Medicine

## 2015-11-05 DIAGNOSIS — Z87891 Personal history of nicotine dependence: Secondary | ICD-10-CM | POA: Diagnosis not present

## 2015-11-05 DIAGNOSIS — I700 Atherosclerosis of aorta: Secondary | ICD-10-CM | POA: Diagnosis not present

## 2015-12-13 ENCOUNTER — Telehealth: Payer: Self-pay

## 2015-12-13 NOTE — Telephone Encounter (Signed)
  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 Pt will be scheduled 01/03/16. Voicemail left for patient to return call regarding.

## 2015-12-14 ENCOUNTER — Telehealth: Payer: Self-pay

## 2015-12-14 NOTE — Telephone Encounter (Signed)
  Oncology Nurse Navigator Documentation  Navigator Location: [facility] (12/14/15 1100)                                            Time Spent with Patient: 15 (12/14/15 1100)   Voicemail left for patient to return call. Needs information regarding EUS for 01/03/16. Instructions mailed. Will continue to attempt to contact.

## 2015-12-17 ENCOUNTER — Telehealth: Payer: Self-pay

## 2015-12-17 NOTE — Telephone Encounter (Signed)
  Oncology Nurse Navigator Documentation  Navigator Location: [facility] (12/17/15 1000) Navigator Encounter Type: Telephone (12/17/15 1000) Telephone: [place] Crocker Call;Appt Confirmation/Clarification (12/17/15 1000)                 Interventions: Coordination of Care (12/17/15 1000)   Coordination of Care: EUS (12/17/15 1000)                  Time Spent with Patient: 30 (12/17/15 1000)   Spoke with Ms Lascola on the phone. Went over instructions for EUS scheduled for 01/03/16. Ac opy was mailed to home address as well. Provided contact information for any further questions or concerns.  INSTRUCTIONS FOR ENDOSCOPIC ULTRASOUND -Your procedure has been scheduled for March 30th with Dr Mont Dutton at [facility] will contact you to pre-register over the phone. If for any reason you have not received a call within one week prior to your scheduled procedure date, please call [phone]. -To get your scheduled arrival time, please call the Endoscopy unit at  [phone] between 1-3pm on: March 29th   -ON THE DAY OF YOU PROCEDURE:   1. If you are scheduled for a morning procedure, nothing to drink after midnight  -If you are scheduled for an afternoon procedure, you may have clear liquids until 5 hours prior  to the procedure but no carbonated drinks or broth  2. NO FOOD THE DAY OF YOUR PROCEDURE  3. You may take your heart, seizure, blood pressure, Parkinson's or breathing medications at  6am with just enough water to get your pills down  4. Do not take any oral Diabetic medications the morning of your procedure.  5. If you are a diabetic and are using insulin, please notify your prescribing physician of this  procedure as your dose may need to be altered related to not being able to eat or drink.   5. Do not take Vitamins     -On the day of your procedure, come to the [facility] Admitting/Registration desk (First desk on the right) at the scheduled arrival  time. You MUST have someone drive you home from your procedure. You must have a responsible adult with a valid drivers license who is on site throughout your entire procedure and who can stay with you for several hours after your procedure. You may not go home alone in a taxi, shuttle [facility] or bus, as the drivers will not be responsible for you.

## 2015-12-24 ENCOUNTER — Telehealth: Payer: Self-pay

## 2015-12-24 NOTE — Telephone Encounter (Signed)
  Oncology Nurse Navigator Documentation  Navigator Location: [facility] (12/24/15 1000) Navigator Encounter Type: Telephone (12/24/15 1000) Telephone: Incoming Call (12/24/15 1000)                                        Time Spent with Patient: 15 (12/24/15 1000)   Lynn Potter was returning call regarding setting up EUS. Went back over instructions. She has received her copy and states she has no questions regarding.

## 2016-01-02 ENCOUNTER — Encounter: Payer: Self-pay | Admitting: *Deleted

## 2016-01-03 ENCOUNTER — Ambulatory Visit
Admission: RE | Admit: 2016-01-03 | Discharge: 2016-01-03 | Disposition: A | Discharge status: Home / Self Care | Payer: Medicare Other | Source: Ambulatory Visit | Attending: Internal Medicine | Admitting: Internal Medicine

## 2016-01-03 ENCOUNTER — Encounter: Payer: Self-pay | Admitting: *Deleted

## 2016-01-03 ENCOUNTER — Encounter
Admission: RE | Disposition: A | Discharge status: Home / Self Care | Payer: Self-pay | Source: Ambulatory Visit | Attending: Internal Medicine

## 2016-01-03 ENCOUNTER — Ambulatory Visit: Payer: Medicare Other | Admitting: Anesthesiology

## 2016-01-03 DIAGNOSIS — I4891 Unspecified atrial fibrillation: Secondary | ICD-10-CM | POA: Diagnosis not present

## 2016-01-03 DIAGNOSIS — M1990 Unspecified osteoarthritis, unspecified site: Secondary | ICD-10-CM | POA: Diagnosis not present

## 2016-01-03 DIAGNOSIS — H409 Unspecified glaucoma: Secondary | ICD-10-CM | POA: Diagnosis not present

## 2016-01-03 DIAGNOSIS — I10 Essential (primary) hypertension: Secondary | ICD-10-CM | POA: Diagnosis not present

## 2016-01-03 DIAGNOSIS — Z79899 Other long term (current) drug therapy: Secondary | ICD-10-CM | POA: Insufficient documentation

## 2016-01-03 DIAGNOSIS — E669 Obesity, unspecified: Secondary | ICD-10-CM | POA: Diagnosis not present

## 2016-01-03 DIAGNOSIS — F329 Major depressive disorder, single episode, unspecified: Secondary | ICD-10-CM | POA: Diagnosis not present

## 2016-01-03 DIAGNOSIS — K449 Diaphragmatic hernia without obstruction or gangrene: Secondary | ICD-10-CM | POA: Diagnosis not present

## 2016-01-03 DIAGNOSIS — Z79891 Long term (current) use of opiate analgesic: Secondary | ICD-10-CM | POA: Diagnosis not present

## 2016-01-03 DIAGNOSIS — Z8509 Personal history of malignant neoplasm of other digestive organs: Secondary | ICD-10-CM | POA: Insufficient documentation

## 2016-01-03 DIAGNOSIS — J45909 Unspecified asthma, uncomplicated: Secondary | ICD-10-CM | POA: Diagnosis not present

## 2016-01-03 DIAGNOSIS — G4730 Sleep apnea, unspecified: Secondary | ICD-10-CM | POA: Insufficient documentation

## 2016-01-03 DIAGNOSIS — Z7951 Long term (current) use of inhaled steroids: Secondary | ICD-10-CM | POA: Insufficient documentation

## 2016-01-03 DIAGNOSIS — Z7982 Long term (current) use of aspirin: Secondary | ICD-10-CM | POA: Diagnosis not present

## 2016-01-03 DIAGNOSIS — Z6834 Body mass index (BMI) 34.0-34.9, adult: Secondary | ICD-10-CM | POA: Insufficient documentation

## 2016-01-03 HISTORY — DX: Varicella without complication: B01.9

## 2016-01-03 HISTORY — DX: Sleep apnea, unspecified: G47.30

## 2016-01-03 HISTORY — DX: Unspecified osteoarthritis, unspecified site: M19.90

## 2016-01-03 HISTORY — DX: Essential (primary) hypertension: I10

## 2016-01-03 HISTORY — PX: UPPER ESOPHAGEAL ENDOSCOPIC ULTRASOUND (EUS): SHX6562

## 2016-01-03 HISTORY — DX: Unspecified atrial fibrillation: I48.91

## 2016-01-03 HISTORY — DX: Benign neoplasm of colon, unspecified: D12.6

## 2016-01-03 HISTORY — DX: Unspecified hemorrhoids: K64.9

## 2016-01-03 HISTORY — DX: Personal history of other diseases of the digestive system: Z87.19

## 2016-01-03 HISTORY — DX: Depression, unspecified: F32.A

## 2016-01-03 HISTORY — DX: Migraine, unspecified, not intractable, without status migrainosus: G43.909

## 2016-01-03 HISTORY — DX: Obesity, unspecified: E66.9

## 2016-01-03 HISTORY — DX: Unspecified asthma, uncomplicated: J45.909

## 2016-01-03 HISTORY — DX: Unspecified glaucoma: H40.9

## 2016-01-03 SURGERY — UPPER ESOPHAGEAL ENDOSCOPIC ULTRASOUND (EUS)
Anesthesia: General

## 2016-01-03 MED ORDER — SODIUM CHLORIDE 0.9 % IV SOLN
INTRAVENOUS | Status: DC
  Administered 2016-01-03 (×2): via INTRAVENOUS

## 2016-01-03 MED ORDER — FENTANYL CITRATE (PF) 100 MCG/2ML IJ SOLN
INTRAMUSCULAR | Status: DC | PRN
  Administered 2016-01-03: 50 ug via INTRAVENOUS

## 2016-01-03 MED ORDER — PROPOFOL 10 MG/ML IV BOLUS
INTRAVENOUS | Status: DC | PRN
  Administered 2016-01-03: 50 mg via INTRAVENOUS

## 2016-01-03 MED ORDER — PROPOFOL 500 MG/50ML IV EMUL
INTRAVENOUS | Status: DC | PRN
  Administered 2016-01-03: 180 ug/kg/min via INTRAVENOUS

## 2016-01-03 MED ORDER — MIDAZOLAM HCL 5 MG/5ML IJ SOLN: INTRAMUSCULAR | Status: DC | PRN

## 2016-01-03 MED ORDER — MIDAZOLAM HCL 5 MG/5ML IJ SOLN
INTRAMUSCULAR | Status: DC | PRN
  Administered 2016-01-03: 1 mg via INTRAVENOUS

## 2016-01-03 MED ORDER — LIDOCAINE HCL (CARDIAC) 20 MG/ML IV SOLN
INTRAVENOUS | Status: DC | PRN
  Administered 2016-01-03: 100 mg via INTRAVENOUS

## 2016-01-03 NOTE — Anesthesia Procedure Notes (Signed)
Date/Time: 01/03/2016 10:00 AM Performed by: Allean Found Pre-anesthesia Checklist: Patient identified, Emergency Drugs available, Suction available, Patient being monitored and Timeout performed Patient Re-evaluated:Patient Re-evaluated prior to inductionOxygen Delivery Method: Nasal cannula

## 2016-01-03 NOTE — Op Note (Signed)
[facility] Patient Name: Lynn Potter Procedure Date: 01/03/2016 9:48 AM MRN: [id number] Account #: [id number] Date of Birth: 11/01/1945 Admit Type: Outpatient Age: 70 Room: [facility] ENDO ROOM 3 Gender: Female Note Status: Finalized Procedure:            Upper EUS Indications:          Duodenal mucosal mass/polyp found on endoscopy: history                        of duodenal neuroendocrine tumor previously resected,                        For requested surveillance EUS examination Patient Profile:      Refer to note in patient chart for documentation of                        history and physical. Providers:            Murray Hodgkins. Deneshia Zucker Referring MD:         Jordan Likes. Lavena Bullion (Referring MD), Gerrit Heck. Rayann Heman, MD                        (Referring MD) Medicines:            Propofol per Anesthesia Complications:        No immediate complications. Procedure:            Pre-Anesthesia Assessment:                       Prior to the procedure, a History and Physical was                        performed, and patient medications and allergies were                        reviewed. The patient is competent. The risks and                        benefits of the procedure and the sedation options and                        risks were discussed with the patient. All questions                        were answered and informed consent was obtained.                        Patient identification and proposed procedure were                        verified by the physician, the nurse and the                        anesthetist in the pre-procedure area. Mental Status                        Examination: alert and oriented. Airway Examination:                        normal oropharyngeal airway and neck mobility.  Respiratory Examination: clear to auscultation. CV                        Examination: normal. Prophylactic Antibiotics: The           patient does not require prophylactic antibiotics.                        Prior Anticoagulants: The patient has taken no previous                        anticoagulant or antiplatelet agents. ASA Grade                        Assessment: III - A patient with severe systemic                        disease. After reviewing the risks and benefits, the                        patient was deemed in satisfactory condition to undergo                        the procedure. The anesthesia plan was to use monitored                        anesthesia care (MAC). Immediately prior to                        administration of medications, the patient was                        re-assessed for adequacy to receive sedatives. The                        heart rate, respiratory rate, oxygen saturations, blood                        pressure, adequacy of pulmonary ventilation, and                        response to care were monitored throughout the                        procedure. The physical status of the patient was                        re-assessed after the procedure.                       After obtaining informed consent, the endoscope was                        passed under direct vision. Throughout the procedure,                        the patient's blood pressure, pulse, and oxygen                        saturations were monitored continuously. The EUS GI  Linear Array [id number] was introduced through the mouth,                        and advanced to the duodenum for ultrasound examination                        from the stomach and duodenum. The Olympus GIF-140                        endoscope (S#: [id number]) was introduced through the                        mouth, and advanced to the second part of duodenum. The                        upper EUS was accomplished without difficulty. The                        patient tolerated the procedure well. Findings:      Endoscopic  Finding :      The examined esophagus was endoscopically normal.      A medium-sized hiatal hernia was present.      The entire examined stomach was endoscopically normal.      The examined duodenum was endoscopically normal.      Endosonographic Finding :      There was no sign of significant endosonographic abnormality in the       duodenal bulb. No masses and no wall thickening were identified.      No lymphadenopathy seen.      Endosonographic imaging in the left lobe of the liver showed no       abnormalities.      The celiac region was visualized and showed no sign of significant       endosonographic abnormality. Impression:           EGD Impressions:                       - Normal esophagus.                       - Medium-sized hiatal hernia.                       - Normal stomach.                       - Normal examined duodenum.                       EUS Impressions:                       - There was no sign of significant pathology in the                        duodenal bulb.                       - No lymphadenopathy seen.                       - Normal visualized portions of the liver.                       -  Normal celiac region.                       - No specimens collected. Recommendation:       - Discharge patient to home (ambulatory).                       - Consider a repeat upper endoscopic ultrasound in 1                        year for surveillance.                       - The findings and recommendations were discussed with                        the patient and her family.                       - Return to referring physician as previously scheduled. Procedure Code(s):    --- Professional ---                       [phone], Esophagogastroduodenoscopy, flexible, transoral;                        with endoscopic ultrasound examination, including the                        esophagus, stomach, and either the duodenum or a                        surgically altered  stomach where the jejunum is                        examined distal to the anastomosis Diagnosis Code(s):    --- Professional ---                       K31.89, Other diseases of stomach and duodenum                       K44.9, Diaphragmatic hernia without obstruction or                        gangrene CPT copyright 2016 [employer]. All rights reserved. The codes documented in this report are preliminary and upon coder review may  be revised to meet current compliance requirements. Attending Participation:      I personally performed the entire procedure without the assistance of a       fellow, resident or surgical assistant. [place],  01/03/2016 10:14:28 AM This report has been signed electronically. Number of Addenda: 0 Note Initiated On: 01/03/2016 9:48 AM Estimated Blood Loss: Estimated blood loss: none.      [facility]

## 2016-01-03 NOTE — Anesthesia Preprocedure Evaluation (Signed)
Anesthesia Evaluation  Patient identified by MRN, date of birth, ID band Patient awake    Reviewed: Allergy & Precautions, NPO status , Patient's Chart, lab work & pertinent test results  History of Anesthesia Complications Negative for: history of anesthetic complications  Airway Mallampati: III       Dental  (+) Upper Dentures, Lower Dentures   Pulmonary neg pulmonary ROS, asthma , sleep apnea , COPD, Current Smoker,           Cardiovascular hypertension, Pt. on medications and Pt. on home beta blockers      Neuro/Psych Depression negative neurological ROS     GI/Hepatic Neg liver ROS, hiatal hernia,   Endo/Other  negative endocrine ROS  Renal/GU Renal disease (stones)     Musculoskeletal  (+) Arthritis , Osteoarthritis,    Abdominal   Peds  Hematology negative hematology ROS (+)   Anesthesia Other Findings   Reproductive/Obstetrics                             Anesthesia Physical Anesthesia Plan  ASA: III  Anesthesia Plan: General   Post-op Pain Management:    Induction: Intravenous  Airway Management Planned: Nasal Cannula  Additional Equipment:   Intra-op Plan:   Post-operative Plan:   Informed Consent: I have reviewed the patients History and Physical, chart, labs and discussed the procedure including the risks, benefits and alternatives for the proposed anesthesia with the patient or authorized representative who has indicated his/her understanding and acceptance.     Plan Discussed with:   Anesthesia Plan Comments:         Anesthesia Quick Evaluation

## 2016-01-03 NOTE — H&P (Signed)
Lynn Potter is an 70 y.o. female.   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  Referred for annual surveillance EUS.  Past Medical History  Diagnosis Date  . Personal history of tobacco use, presenting hazards to health 06/18/2015  . Arthritis   . Asthma   . Depression   . History of hiatal hernia   . Chicken pox   . Glaucoma   . Kidney stones   . Hypertension   . Hemorrhoids   . AF (atrial fibrillation) ([place])   . Migraines   . Obesity   . Sleep apnea   . Tobacco abuse   . Tubular adenoma of colon     Past Surgical History  Procedure Laterality Date  . Joint replacement    . Total knee arthroplasty    . Abdominal hysterectomy    . Cholecystectomy    . Colonoscopy with esophagogastroduodenoscopy (egd)    . Carpal tunnell    . Eye surgery    . Cataract extraction    . Back surgery    . Lumbar fusion      History reviewed. No pertinent family history. Social History:  reports that she has been smoking.  She does not have any smokeless tobacco history on file. Her alcohol and drug histories are not on file.  Allergies: No Known Allergies  Medications Prior to Admission  Medication Sig Dispense Refill  . albuterol-ipratropium (COMBIVENT) 18-103 MCG/ACT inhaler Inhale 1 puff into the lungs every 6 (six) hours as needed for wheezing or shortness of breath.    . ALPRAZolam (XANAX) 0.5 MG tablet Take 0.5 mg by mouth at bedtime as needed for anxiety.    Marland Kitchen aspirin 81 MG tablet Take 81 mg by mouth daily.    Marland Kitchen atorvastatin (LIPITOR) 40 MG tablet Take 40 mg by mouth daily.    . benazepril-hydrochlorthiazide (LOTENSIN HCT) 20-25 MG tablet Take 1 tablet by mouth daily.    . clonazePAM (KLONOPIN) 0.5 MG tablet Take 0.5 mg by mouth 2 (two) times daily as needed for anxiety.    . dicyclomine (BENTYL) 10 MG capsule Take 10 mg by mouth 4 (four) times daily -  before meals and at bedtime.    Marland Kitchen etodolac  (LODINE) 500 MG tablet Take 500 mg by mouth 2 (two) times daily.    . ferrous sulfate 325 (65 FE) MG EC tablet Take 325 mg by mouth 3 (three) times daily with meals.    . fluticasone (FLOVENT HFA) 110 MCG/ACT inhaler Inhale into the lungs 2 (two) times daily.    Marland Kitchen latanoprost (XALATAN) 0.005 % ophthalmic solution 1 drop at bedtime.    . metoprolol succinate (TOPROL-XL) 50 MG 24 hr tablet Take 50 mg by mouth daily. Take with or immediately following a meal.    . Na Sulfate-K Sulfate-Mg Sulf (SUPREP BOWEL PREP PO) Take by mouth.    . oxyCODONE-acetaminophen (PERCOCET/ROXICET) 5-325 MG tablet Take by mouth every 4 (four) hours as needed for severe pain.    . pregabalin (LYRICA) 150 MG capsule Take 150 mg by mouth 2 (two) times daily.    . SUMAtriptan (IMITREX) 50 MG tablet Take 50 mg by mouth every 2 (two) hours as needed for migraine. May repeat in 2 hours if headache persists or recurs.    Marland Kitchen tiZANidine (ZANAFLEX) 2 MG tablet Take by mouth every 6 (six) hours as needed for muscle spasms.    . Vitamin D,  Ergocalciferol, (DRISDOL) 50000 units CAPS capsule Take 50,000 Units by mouth every 7 (seven) days.      No results found for this or any previous visit (from the past 48 hour(s)). No results found.  ROS  Blood pressure 169/79, pulse 80, temperature 98.7 F (37.1 C), temperature source Tympanic, resp. rate 20, height 5\' 5"  (1.651 m), weight 94.802 kg (209 lb), SpO2 99 %. Physical Exam  Resp: CTAB Cardio: RRR, no murmurs Abd: Soft, NT, ND  Assessment/Plan Annual surveillance EUS given prior history of duodenal NET.  Tillie Rung, MD 01/03/2016, 9:41 AM

## 2016-01-03 NOTE — Transfer of Care (Signed)
Immediate Anesthesia Transfer of Care Note  Patient: Lynn Potter  Procedure(s) Performed: Procedure(s): UPPER ESOPHAGEAL ENDOSCOPIC ULTRASOUND (EUS) (N/A)  Patient Location: PACU  Anesthesia Type:General  Level of Consciousness: sedated  Airway & Oxygen Therapy: Patient Spontanous Breathing and Patient connected to nasal cannula oxygen  Post-op Assessment: Report given to RN and Post -op Vital signs reviewed and stable  Post vital signs: Reviewed and stable  Last Vitals:  Filed Vitals:   01/03/16 0859 01/03/16 1020  BP: 169/79 114/63  Pulse: 80 70  Temp: 37.1 C 36.2 C  Resp: 20 20    Complications: No apparent anesthesia complications

## 2016-01-03 NOTE — Anesthesia Postprocedure Evaluation (Signed)
Anesthesia Post Note  Patient: DESHAUN BIVIANO  Procedure(s) Performed: Procedure(s) (LRB): UPPER ESOPHAGEAL ENDOSCOPIC ULTRASOUND (EUS) (N/A)  Patient location during evaluation: Endoscopy Anesthesia Type: General Level of consciousness: awake and alert Pain management: pain level controlled Vital Signs Assessment: post-procedure vital signs reviewed and stable Respiratory status: spontaneous breathing and respiratory function stable Cardiovascular status: stable Anesthetic complications: no    Last Vitals:  Filed Vitals:   01/03/16 1040 01/03/16 1050  BP: 148/72 155/76  Pulse: 56 67  Temp:    Resp: 17 16    Last Pain: There were no vitals filed for this visit.               KEPHART,WILLIAM K

## 2016-01-04 ENCOUNTER — Encounter: Payer: Self-pay | Admitting: Internal Medicine

## 2016-09-30 ENCOUNTER — Emergency Department: Payer: Medicare Other

## 2016-09-30 ENCOUNTER — Encounter: Payer: Self-pay | Admitting: Emergency Medicine

## 2016-09-30 ENCOUNTER — Inpatient Hospital Stay
Admission: EM | Admit: 2016-09-30 | Discharge: 2016-10-02 | DRG: 872 | Disposition: A | Discharge status: Home / Self Care | Payer: Medicare Other | Attending: Internal Medicine | Admitting: Internal Medicine

## 2016-09-30 DIAGNOSIS — F17200 Nicotine dependence, unspecified, uncomplicated: Secondary | ICD-10-CM | POA: Diagnosis present

## 2016-09-30 DIAGNOSIS — Z9849 Cataract extraction status, unspecified eye: Secondary | ICD-10-CM | POA: Diagnosis not present

## 2016-09-30 DIAGNOSIS — Z981 Arthrodesis status: Secondary | ICD-10-CM | POA: Diagnosis not present

## 2016-09-30 DIAGNOSIS — Z7951 Long term (current) use of inhaled steroids: Secondary | ICD-10-CM | POA: Diagnosis not present

## 2016-09-30 DIAGNOSIS — Z7982 Long term (current) use of aspirin: Secondary | ICD-10-CM

## 2016-09-30 DIAGNOSIS — E669 Obesity, unspecified: Secondary | ICD-10-CM | POA: Diagnosis present

## 2016-09-30 DIAGNOSIS — J449 Chronic obstructive pulmonary disease, unspecified: Secondary | ICD-10-CM | POA: Diagnosis present

## 2016-09-30 DIAGNOSIS — A419 Sepsis, unspecified organism: Secondary | ICD-10-CM | POA: Diagnosis present

## 2016-09-30 DIAGNOSIS — I10 Essential (primary) hypertension: Secondary | ICD-10-CM | POA: Diagnosis present

## 2016-09-30 DIAGNOSIS — Z90710 Acquired absence of both cervix and uterus: Secondary | ICD-10-CM

## 2016-09-30 DIAGNOSIS — Z96659 Presence of unspecified artificial knee joint: Secondary | ICD-10-CM | POA: Diagnosis present

## 2016-09-30 DIAGNOSIS — Z6841 Body Mass Index (BMI) 40.0 and over, adult: Secondary | ICD-10-CM

## 2016-09-30 DIAGNOSIS — I4891 Unspecified atrial fibrillation: Secondary | ICD-10-CM | POA: Diagnosis present

## 2016-09-30 DIAGNOSIS — A4151 Sepsis due to Escherichia coli [E. coli]: Secondary | ICD-10-CM | POA: Diagnosis present

## 2016-09-30 DIAGNOSIS — G4730 Sleep apnea, unspecified: Secondary | ICD-10-CM | POA: Diagnosis present

## 2016-09-30 DIAGNOSIS — H409 Unspecified glaucoma: Secondary | ICD-10-CM | POA: Diagnosis present

## 2016-09-30 DIAGNOSIS — Z79899 Other long term (current) drug therapy: Secondary | ICD-10-CM

## 2016-09-30 LAB — COMPREHENSIVE METABOLIC PANEL
ALK PHOS: 82 U/L (ref 38–126)
ALT: 16 U/L (ref 14–54)
AST: 30 U/L (ref 15–41)
Albumin: 3.5 g/dL (ref 3.5–5.0)
Anion gap: 8 (ref 5–15)
CALCIUM: 8.6 mg/dL — AB (ref 8.9–10.3)
CHLORIDE: 102 mmol/L (ref 101–111)
CO2: 26 mmol/L (ref 22–32)
CREATININE: 0.61 mg/dL (ref 0.44–1.00)
Glucose, Bld: 123 mg/dL — ABNORMAL HIGH (ref 65–99)
Potassium: 3.6 mmol/L (ref 3.5–5.1)
Sodium: 136 mmol/L (ref 135–145)
Total Bilirubin: 0.9 mg/dL (ref 0.3–1.2)
Total Protein: 7.5 g/dL (ref 6.5–8.1)

## 2016-09-30 LAB — CBC WITH DIFFERENTIAL/PLATELET
Basophils Absolute: 0 K/uL (ref 0–0.1)
Basophils Relative: 0 %
Eosinophils Absolute: 0 K/uL (ref 0–0.7)
Eosinophils Relative: 0 %
HCT: 39 % (ref 35.0–47.0)
Hemoglobin: 12.4 g/dL (ref 12.0–16.0)
Lymphocytes Relative: 3 %
Lymphs Abs: 0.5 K/uL — ABNORMAL LOW (ref 1.0–3.6)
MCH: 25.7 pg — ABNORMAL LOW (ref 26.0–34.0)
MCHC: 31.8 g/dL — ABNORMAL LOW (ref 32.0–36.0)
MCV: 80.7 fL (ref 80.0–100.0)
Monocytes Absolute: 0.3 K/uL (ref 0.2–0.9)
Monocytes Relative: 2 %
Neutro Abs: 16.3 K/uL — ABNORMAL HIGH (ref 1.4–6.5)
Neutrophils Relative %: 95 %
Platelets: 259 K/uL (ref 150–440)
RBC: 4.84 MIL/uL (ref 3.80–5.20)
RDW: 15.3 % — ABNORMAL HIGH (ref 11.5–14.5)
WBC: 17.2 K/uL — ABNORMAL HIGH (ref 3.6–11.0)

## 2016-09-30 LAB — URINALYSIS, COMPLETE (UACMP) WITH MICROSCOPIC
BILIRUBIN URINE: NEGATIVE
Bacteria, UA: NONE SEEN
Glucose, UA: NEGATIVE mg/dL
Hgb urine dipstick: NEGATIVE
KETONES UR: NEGATIVE mg/dL
LEUKOCYTES UA: NEGATIVE
Nitrite: NEGATIVE
PROTEIN: NEGATIVE mg/dL
Specific Gravity, Urine: 1.016 (ref 1.005–1.030)
pH: 5 (ref 5.0–8.0)

## 2016-09-30 LAB — PROCALCITONIN: PROCALCITONIN: 0.32 ng/mL

## 2016-09-30 LAB — LACTIC ACID, PLASMA
LACTIC ACID, VENOUS: 2.5 mmol/L — AB (ref 0.5–1.9)
Lactic Acid, Venous: 1.9 mmol/L (ref 0.5–1.9)

## 2016-09-30 LAB — INFLUENZA PANEL BY PCR (TYPE A & B)
INFLAPCR: NEGATIVE
INFLBPCR: NEGATIVE

## 2016-09-30 LAB — PROTIME-INR
INR: 1
Prothrombin Time: 13.2 seconds (ref 11.4–15.2)

## 2016-09-30 LAB — TROPONIN I: Troponin I: 0.03 ng/mL (ref <0.03)

## 2016-09-30 MED ORDER — BENAZEPRIL-HYDROCHLOROTHIAZIDE 20-25 MG PO TABS: 1.0000 | ORAL_TABLET | Freq: Every day | ORAL | Status: DC

## 2016-09-30 MED ORDER — ACETAMINOPHEN 325 MG PO TABS
650.0000 mg | ORAL_TABLET | Freq: Once | ORAL | Status: AC
  Administered 2016-09-30: 650 mg via ORAL
  Filled 2016-09-30: qty 2

## 2016-09-30 MED ORDER — METOPROLOL SUCCINATE ER 25 MG PO TB24
50.0000 mg | ORAL_TABLET | Freq: Every day | ORAL | Status: DC
  Administered 2016-09-30 – 2016-10-02 (×3): 50 mg via ORAL
  Filled 2016-09-30 (×3): qty 2

## 2016-09-30 MED ORDER — ONDANSETRON HCL 4 MG/2ML IJ SOLN: 4.0000 mg | Freq: Four times a day (QID) | INTRAMUSCULAR | Status: DC | PRN

## 2016-09-30 MED ORDER — ACETAMINOPHEN 325 MG PO TABS
650.0000 mg | ORAL_TABLET | Freq: Four times a day (QID) | ORAL | Status: DC | PRN
  Administered 2016-10-01 – 2016-10-02 (×3): 650 mg via ORAL
  Filled 2016-09-30 (×3): qty 2

## 2016-09-30 MED ORDER — IPRATROPIUM-ALBUTEROL 0.5-2.5 (3) MG/3ML IN SOLN
3.0000 mL | Freq: Four times a day (QID) | RESPIRATORY_TRACT | Status: DC | PRN
  Administered 2016-10-02: 3 mL via RESPIRATORY_TRACT
  Filled 2016-09-30: qty 3

## 2016-09-30 MED ORDER — FERROUS SULFATE 325 (65 FE) MG PO TABS
325.0000 mg | ORAL_TABLET | Freq: Three times a day (TID) | ORAL | Status: DC
  Administered 2016-10-01 – 2016-10-02 (×3): 325 mg via ORAL
  Filled 2016-09-30 (×3): qty 1

## 2016-09-30 MED ORDER — CLONAZEPAM 0.5 MG PO TABS: 0.5000 mg | ORAL_TABLET | Freq: Two times a day (BID) | ORAL | Status: DC | PRN

## 2016-09-30 MED ORDER — DICYCLOMINE HCL 10 MG PO CAPS
10.0000 mg | ORAL_CAPSULE | Freq: Three times a day (TID) | ORAL | Status: DC
  Administered 2016-09-30 – 2016-10-02 (×5): 10 mg via ORAL
  Filled 2016-09-30 (×5): qty 1

## 2016-09-30 MED ORDER — ATORVASTATIN CALCIUM 20 MG PO TABS
40.0000 mg | ORAL_TABLET | Freq: Every day | ORAL | Status: DC
  Administered 2016-09-30 – 2016-10-02 (×3): 40 mg via ORAL
  Filled 2016-09-30 (×3): qty 2

## 2016-09-30 MED ORDER — PANTOPRAZOLE SODIUM 40 MG PO TBEC
40.0000 mg | DELAYED_RELEASE_TABLET | Freq: Every day | ORAL | Status: DC
  Administered 2016-09-30 – 2016-10-02 (×3): 40 mg via ORAL
  Filled 2016-09-30 (×3): qty 1

## 2016-09-30 MED ORDER — SODIUM CHLORIDE 0.9 % IV BOLUS (SEPSIS)
1000.0000 mL | Freq: Once | INTRAVENOUS | Status: AC
  Administered 2016-09-30: 1000 mL via INTRAVENOUS

## 2016-09-30 MED ORDER — ONDANSETRON HCL 4 MG PO TABS
4.0000 mg | ORAL_TABLET | Freq: Four times a day (QID) | ORAL | Status: DC | PRN
Start: 2016-09-30 — End: 2016-10-02

## 2016-09-30 MED ORDER — HYDROCHLOROTHIAZIDE 25 MG PO TABS
25.0000 mg | ORAL_TABLET | Freq: Every day | ORAL | Status: DC
  Administered 2016-09-30 – 2016-10-02 (×3): 25 mg via ORAL
  Filled 2016-09-30 (×3): qty 1

## 2016-09-30 MED ORDER — PIPERACILLIN-TAZOBACTAM 3.375 G IVPB 30 MIN
3.3750 g | Freq: Once | INTRAVENOUS | Status: AC
  Administered 2016-09-30: 3.375 g via INTRAVENOUS
  Filled 2016-09-30: qty 50

## 2016-09-30 MED ORDER — PIPERACILLIN-TAZOBACTAM 3.375 G IVPB
3.3750 g | Freq: Three times a day (TID) | INTRAVENOUS | Status: DC
  Administered 2016-09-30 – 2016-10-01 (×2): 3.375 g via INTRAVENOUS
  Filled 2016-09-30 (×2): qty 50

## 2016-09-30 MED ORDER — BUDESONIDE 0.25 MG/2ML IN SUSP
0.2500 mg | Freq: Two times a day (BID) | RESPIRATORY_TRACT | Status: DC
  Administered 2016-10-01 – 2016-10-02 (×3): 0.25 mg via RESPIRATORY_TRACT
  Filled 2016-09-30 (×3): qty 2

## 2016-09-30 MED ORDER — FLUTICASONE PROPIONATE HFA 110 MCG/ACT IN AERO: 2.0000 | INHALATION_SPRAY | Freq: Two times a day (BID) | RESPIRATORY_TRACT | Status: DC

## 2016-09-30 MED ORDER — PREGABALIN 75 MG PO CAPS
150.0000 mg | ORAL_CAPSULE | Freq: Two times a day (BID) | ORAL | Status: DC
  Administered 2016-09-30 – 2016-10-02 (×4): 150 mg via ORAL
  Filled 2016-09-30 (×4): qty 2

## 2016-09-30 MED ORDER — ENOXAPARIN SODIUM 40 MG/0.4ML ~~LOC~~ SOLN
40.0000 mg | Freq: Two times a day (BID) | SUBCUTANEOUS | Status: DC
  Administered 2016-09-30 – 2016-10-02 (×2): 40 mg via SUBCUTANEOUS
  Filled 2016-09-30 (×3): qty 0.4

## 2016-09-30 MED ORDER — SODIUM CHLORIDE 0.9 % IV SOLN
INTRAVENOUS | Status: DC
  Administered 2016-09-30 – 2016-10-02 (×4): via INTRAVENOUS

## 2016-09-30 MED ORDER — VANCOMYCIN HCL IN DEXTROSE 1-5 GM/200ML-% IV SOLN
1000.0000 mg | Freq: Once | INTRAVENOUS | Status: AC
  Administered 2016-09-30: 1000 mg via INTRAVENOUS
  Filled 2016-09-30: qty 200

## 2016-09-30 MED ORDER — ACETAMINOPHEN 650 MG RE SUPP: 650.0000 mg | Freq: Four times a day (QID) | RECTAL | Status: DC | PRN

## 2016-09-30 MED ORDER — GABAPENTIN 300 MG PO CAPS
300.0000 mg | ORAL_CAPSULE | Freq: Two times a day (BID) | ORAL | Status: DC
  Administered 2016-09-30 – 2016-10-02 (×4): 300 mg via ORAL
  Filled 2016-09-30 (×4): qty 1

## 2016-09-30 MED ORDER — ASPIRIN EC 81 MG PO TBEC
81.0000 mg | DELAYED_RELEASE_TABLET | Freq: Every day | ORAL | Status: DC
  Administered 2016-10-01 – 2016-10-02 (×2): 81 mg via ORAL
  Filled 2016-09-30 (×2): qty 1

## 2016-09-30 MED ORDER — BENAZEPRIL HCL 20 MG PO TABS
20.0000 mg | ORAL_TABLET | Freq: Every day | ORAL | Status: DC
  Administered 2016-09-30 – 2016-10-02 (×3): 20 mg via ORAL
  Filled 2016-09-30 (×3): qty 1

## 2016-09-30 MED ORDER — VANCOMYCIN HCL IN DEXTROSE 1-5 GM/200ML-% IV SOLN
1000.0000 mg | Freq: Two times a day (BID) | INTRAVENOUS | Status: DC
  Administered 2016-09-30 – 2016-10-01 (×2): 1000 mg via INTRAVENOUS
  Filled 2016-09-30 (×3): qty 200

## 2016-09-30 MED ORDER — ETODOLAC 500 MG PO TABS
500.0000 mg | ORAL_TABLET | Freq: Two times a day (BID) | ORAL | Status: DC
  Administered 2016-09-30 – 2016-10-02 (×4): 500 mg via ORAL
  Filled 2016-09-30 (×5): qty 1

## 2016-09-30 MED ORDER — OXYCODONE HCL 5 MG PO TABS
5.0000 mg | ORAL_TABLET | ORAL | Status: DC | PRN
  Administered 2016-09-30: 5 mg via ORAL
  Filled 2016-09-30: qty 1

## 2016-09-30 MED ORDER — LATANOPROST 0.005 % OP SOLN
1.0000 [drp] | Freq: Every day | OPHTHALMIC | Status: DC
  Administered 2016-09-30 – 2016-10-01 (×2): 1 [drp] via OPHTHALMIC
  Filled 2016-09-30: qty 2.5

## 2016-09-30 NOTE — ED Provider Notes (Signed)
[facility] Emergency Department Provider Note ____________________________________________   I have reviewed the triage vital signs and the triage nursing note.  HISTORY  Chief Complaint Chest Pain and Fever   Historian Patient  HPI Lynn Potter is a 70 y.o. female who presents today feeling body aches and generalized fatigue. Symptoms started around 9 AM. She's had nausea without vomiting or diarrhea. She's had to get up to urinate multiple times. She's had a mild cough. She had chills earlier today and did not show she had a fever until she got here. No significant pain although she says that she has some soreness and points to both sides of her upper abdomen.  Symptoms are moderate. No confusion altered mental status. No sore throat or congestion. No headache.    Past Medical History:  Diagnosis Date  . AF (atrial fibrillation) ([place])   . Arthritis   . Asthma   . Chicken pox   . Depression   . Glaucoma   . Hemorrhoids   . History of hiatal hernia   . Hypertension   . Kidney stones   . Migraines   . Obesity   . Personal history of tobacco use, presenting hazards to health 06/18/2015  . Sleep apnea   . Tobacco abuse   . Tubular adenoma of colon     Patient Active Problem List   Diagnosis Date Noted  . Personal history of tobacco use, presenting hazards to health 06/18/2015    Past Surgical History:  Procedure Laterality Date  . ABDOMINAL HYSTERECTOMY    . BACK SURGERY    . carpal tunnell    . CATARACT EXTRACTION    . CHOLECYSTECTOMY    . COLONOSCOPY WITH ESOPHAGOGASTRODUODENOSCOPY (EGD)    . EYE SURGERY    . JOINT REPLACEMENT    . LUMBAR FUSION    . TOTAL KNEE ARTHROPLASTY    . UPPER ESOPHAGEAL ENDOSCOPIC ULTRASOUND (EUS) N/A 01/03/2016   Procedure: UPPER ESOPHAGEAL ENDOSCOPIC ULTRASOUND (EUS);  Surgeon: Holly Bodily, MD;  Location: [facility] ENDOSCOPY;  Service: Gastroenterology;  Laterality: N/A;    Prior to Admission  medications   Medication Sig Start Date End Date Taking? Authorizing Provider  albuterol-ipratropium (COMBIVENT) 18-103 MCG/ACT inhaler Inhale 1 puff into the lungs every 6 (six) hours as needed for wheezing or shortness of breath.    Historical Provider, MD  ALPRAZolam Duanne Moron) 0.5 MG tablet Take 0.5 mg by mouth at bedtime as needed for anxiety.    Historical Provider, MD  aspirin 81 MG tablet Take 81 mg by mouth daily.    Historical Provider, MD  atorvastatin (LIPITOR) 40 MG tablet Take 40 mg by mouth daily.    Historical Provider, MD  benazepril-hydrochlorthiazide (LOTENSIN HCT) 20-25 MG tablet Take 1 tablet by mouth daily.    Historical Provider, MD  clonazePAM (KLONOPIN) 0.5 MG tablet Take 0.5 mg by mouth 2 (two) times daily as needed for anxiety.    Historical Provider, MD  dicyclomine (BENTYL) 10 MG capsule Take 10 mg by mouth 4 (four) times daily -  before meals and at bedtime.    Historical Provider, MD  etodolac (LODINE) 500 MG tablet Take 500 mg by mouth 2 (two) times daily.    Historical Provider, MD  ferrous sulfate 325 (65 FE) MG EC tablet Take 325 mg by mouth 3 (three) times daily with meals.    Historical Provider, MD  fluticasone (FLOVENT HFA) 110 MCG/ACT inhaler Inhale into the lungs 2 (two) times daily.  Historical Provider, MD  latanoprost (XALATAN) 0.005 % ophthalmic solution 1 drop at bedtime.    Historical Provider, MD  metoprolol succinate (TOPROL-XL) 50 MG 24 hr tablet Take 50 mg by mouth daily. Take with or immediately following a meal.    Historical Provider, MD  Na Sulfate-K Sulfate-Mg Sulf (SUPREP BOWEL PREP PO) Take by mouth.    Historical Provider, MD  oxyCODONE-acetaminophen (PERCOCET/ROXICET) 5-325 MG tablet Take by mouth every 4 (four) hours as needed for severe pain.    Historical Provider, MD  pregabalin (LYRICA) 150 MG capsule Take 150 mg by mouth 2 (two) times daily.    Historical Provider, MD  SUMAtriptan (IMITREX) 50 MG tablet Take 50 mg by mouth every 2  (two) hours as needed for migraine. May repeat in 2 hours if headache persists or recurs.    Historical Provider, MD  tiZANidine (ZANAFLEX) 2 MG tablet Take by mouth every 6 (six) hours as needed for muscle spasms.    Historical Provider, MD  Vitamin D, Ergocalciferol, (DRISDOL) 50000 units CAPS capsule Take 50,000 Units by mouth every 7 (seven) days.    Historical Provider, MD    No Known Allergies  No family history on file.  Social History Social History  Substance Use Topics  . Smoking status: Current Every Day Smoker  . Smokeless tobacco: Never Used  . Alcohol use Not on file    Review of Systems  Constitutional: Positive for chills. Eyes: Negative for visual changes. ENT: Negative for sore throat. Cardiovascular: Negative for chest pain. Respiratory: Negative for shortness of breath.  Positive for mild cough. Gastrointestinal: As per history of present illness. Genitourinary: Negative for dysuria, but positive for frequency of urination. Musculoskeletal: Negative for back pain. Skin: Negative for rash. Neurological: Negative for headache. 10 point Review of Systems otherwise negative ____________________________________________   PHYSICAL EXAM:  VITAL SIGNS: ED Triage Vitals  Enc Vitals Group     BP 09/30/16 1344 101/89     Pulse Rate 09/30/16 1344 (!) 114     Resp 09/30/16 1344 (!) 22     Temp 09/30/16 1344 (!) 102.8 F (39.3 C)     Temp Source 09/30/16 1344 Oral     SpO2 09/30/16 1344 98 %     Weight 09/30/16 1344 251 lb (113.9 kg)     Height 09/30/16 1344 5\' 4"  (1.626 m)     Head Circumference --      Peak Flow --      Pain Score 09/30/16 1350 8     Pain Loc --      Pain Edu? --      Excl. in [place]? --      Constitutional: Alert and oriented. Well appearing and in no distress. HEENT   Head: Normocephalic and atraumatic.      Eyes: Conjunctivae are normal. PERRL. Normal extraocular movements.      Ears:         Nose: No congestion/rhinnorhea.    Mouth/Throat: Mucous membranes are Mildly dry.   Neck: No stridor. Cardiovascular/Chest: Tachycardic, regular rhythm.  No murmurs, rubs, or gallops. Respiratory: Normal respiratory effort without tachypnea nor retractions. Breath sounds are clear and equal bilaterally. No wheezes/rales/rhonchi. Gastrointestinal: Soft. No distention, no guarding, no rebound. Obese. Very mild tenderness diffusely more so in the left-sided abdomen.   Genitourinary/rectal:Deferred Musculoskeletal: Nontender with normal range of motion in all extremities. No joint effusions.  No lower extremity tenderness.  No edema. Neurologic:  Normal speech and language. No gross or focal  neurologic deficits are appreciated. Skin:  Skin is warm, dry and intact. No rash noted. Psychiatric: Mood and affect are normal. Speech and behavior are normal. Patient exhibits appropriate insight and judgment.   ____________________________________________  LABS (pertinent positives/negatives)  Labs Reviewed  COMPREHENSIVE METABOLIC PANEL - Abnormal; Notable for the following:       Result Value   Glucose, Bld 123 (*)    BUN <5 (*)    Calcium 8.6 (*)    All other components within normal limits  LACTIC ACID, PLASMA - Abnormal; Notable for the following:    Lactic Acid, Venous 2.5 (*)    All other components within normal limits  CBC WITH DIFFERENTIAL/PLATELET - Abnormal; Notable for the following:    WBC 17.2 (*)    MCH 25.7 (*)    MCHC 31.8 (*)    RDW 15.3 (*)    Neutro Abs 16.3 (*)    Lymphs Abs 0.5 (*)    All other components within normal limits  CULTURE, BLOOD (ROUTINE X 2)  CULTURE, BLOOD (ROUTINE X 2)  URINE CULTURE  CULTURE, BLOOD (ROUTINE X 2)  CULTURE, BLOOD (ROUTINE X 2)  RAPID INFLUENZA A&B ANTIGENS ([facility] ONLY)  PROTIME-INR  LACTIC ACID, PLASMA  URINALYSIS, COMPLETE (UACMP) WITH MICROSCOPIC  TROPONIN I    ____________________________________________    EKG I, Lisa Roca, MD, the attending physician  have personally viewed and interpreted all ECGs.  115 bpm. Sinus tachycardia. Narrow QRS. Left axis deviation. Nonspecific ST and T-wave ____________________________________________  RADIOLOGY All Xrays were viewed by me. Imaging interpreted by Radiologist.  Chest x-ray two-view:  IMPRESSION: Mild cardiomegaly.  No active lung disease.  Aortic atherosclerosis. __________________________________________  PROCEDURES  Procedure(s) performed: None  Critical Care performed: CRITICAL CARE Performed by: Lisa Roca   Total critical care time: 30 minutes  Critical care time was exclusive of separately billable procedures and treating other patients.  Critical care was necessary to treat or prevent imminent or life-threatening deterioration.  Critical care was time spent personally by me on the following activities: development of treatment plan with patient and/or surrogate as well as nursing, discussions with consultants, evaluation of patient's response to treatment, examination of patient, obtaining history from patient or surrogate, ordering and performing treatments and interventions, ordering and review of laboratory studies, ordering and review of radiographic studies, pulse oximetry and re-evaluation of patient's condition.   ____________________________________________   ED COURSE / ASSESSMENT AND PLAN  Pertinent labs & imaging results that were available during my care of the patient were reviewed by me and considered in my medical decision making (see chart for details).   Ms. Elward is overall well-appearing with no respiratory distress, but is febrile and tachycardic. Blood pressure is borderline. She was started on IV fluid bolus. She is placed on the sepsis pathway. I ordered antibiotic vancomycin and Zosyn for undifferentiated sepsis.  Chest x-ray shows no pneumonia. Urinalysis is pending. Blood cultures and urine cultures have been sent.  Lactate  2.5.  Abdomen is very mildly tender, I'm not highly suspicious of acute intra-abdominal surgical/medical emergency which would require imaging at this point in time.  I spoke with Dr. Lavetta Nielsen at 3 PM, urinalysis and flu studies are still pending.  Patient care transferred to Dr. Rip Harbour at 15 p.m. at shift change. Upon result of urinalysis and flu, hospitalist will be consulted for the admission.  In terms of sepsis, no blood pressure below 100. She was placed for 2 L normal saline bolus.    CONSULTATIONS:  Hospitalist for admission.   Patient / Family / Caregiver informed of clinical course, medical decision-making process, and agree with plan.    ___________________________________________   FINAL CLINICAL IMPRESSION(S) / ED DIAGNOSES   Final diagnoses:  Sepsis, due to unspecified organism [facility])              Note: This dictation was prepared with Dragon dictation. Any transcriptional errors that result from this process are unintentional    Lisa Roca, MD 09/30/16 1513

## 2016-09-30 NOTE — Progress Notes (Signed)
Order for enoxaparin 40 mg subcutaneously daily has been changed to enoxaparin 40 mg BID for DVT prophylaxis per anticoagulation protocol for CrCl > 30 mL/min and BMI > 40.  Lenis Noon, PharmD Clinical Pharmacist 09/30/16 5:39 PM

## 2016-09-30 NOTE — ED Triage Notes (Signed)
Pt to ed via [facility] from home with reports of fever and chest pain started today. Pt denies cough, denies n/v/d. Ems reports pt febrile at time of arrival. Ems gave 324mg  of aspirin in route to ed.

## 2016-09-30 NOTE — ED Notes (Signed)
Lactic acid 2.5 reported to Dr Reita Cliche at this time. Acknowledged.

## 2016-09-30 NOTE — ED Notes (Signed)
Pt assisted up to bathroom with walker assist.

## 2016-09-30 NOTE — ED Notes (Signed)
transporting patient to 103-1C

## 2016-09-30 NOTE — ED Notes (Signed)
Attempt made to call report and unit will not take report until 1920.

## 2016-09-30 NOTE — Progress Notes (Signed)
Pharmacy Antibiotic Note  Lynn Potter is a 70 y.o. female admitted on 09/30/2016 with sepsis.  Pharmacy has been consulted for vancomycin and piperacillin/tazobactam dosing.  Plan: Piperacillin/tazobactam 3.375 g IV q8h EI  Vancomycin 1000 mg dose given in ED. Will order vancomycin 1000 mg IV q12h (6 hour stacked dose) Goal vancomycin trough 15-20 mcg/mL Vancomycin trough ordered for 12/28 @ 2130 which is prior to 5th dose and should represent steady state.   Patient is at risk of accumulation due to obesity.  Kinetics: Using adjusted body weight = 78 kg Ke: 0.079 Half-life: 9 hrs Vd: 54 L Cmin ~ 15 mcg/mL  Height: 5\' 4"  (162.6 cm) Weight: 251 lb (113.9 kg) IBW/kg (Calculated) : 54.7  Temp (24hrs), Avg:101.3 F (38.5 C), Min:99.8 F (37.7 C), Max:102.8 F (39.3 C)   Recent Labs Lab 09/30/16 1346  WBC 17.2*  CREATININE 0.61  LATICACIDVEN 2.5*    Estimated Creatinine Clearance: 79.8 mL/min (by C-G formula based on SCr of 0.61 mg/dL).    No Known Allergies  Antimicrobials this admission: vancomycin 12/26 >>  Piperacillin/tazobactam 12/26 >>   Dose adjustments this admission:  Microbiology results: 12/26 BCx: Sent 12/26 UCx: Sent   Thank you for allowing pharmacy to be a part of this patient's care.  Lenis Noon, PharmD, BCPS Clinical Pharmacist 09/30/2016 6:34 PM

## 2016-09-30 NOTE — H&P (Signed)
[place] [facility] [place] NAME: Lynn Potter    MR#:  [id number]  DATE OF BIRTH:  11/20/1945   DATE OF ADMISSION:  09/30/2016  PRIMARY CARE PHYSICIAN: Vista Mink, FNP   REQUESTING/REFERRING PHYSICIAN: Lord  CHIEF COMPLAINT:   Chief Complaint  Patient presents with  . Chest Pain  . Fever    HISTORY OF PRESENT ILLNESS:  Lynn Potter  is a 70 y.o. female with a known history of COPD non-oxygen requiring who is presenting with fever chills. She was in usual state of health describes one day duration of fever with shaking chills, cough no change from baseline, denies any further symptomatology at this time.  Emergency department course: Patient meeting septic criteria code sepsis initiated  PAST MEDICAL HISTORY:   Past Medical History:  Diagnosis Date  . AF (atrial fibrillation) (Interior)   . Arthritis   . Asthma   . Chicken pox   . Depression   . Glaucoma   . Hemorrhoids   . History of hiatal hernia   . Hypertension   . Kidney stones   . Migraines   . Obesity   . Personal history of tobacco use, presenting hazards to health 06/18/2015  . Sleep apnea   . Tobacco abuse   . Tubular adenoma of colon     PAST SURGICAL HISTORY:   Past Surgical History:  Procedure Laterality Date  . ABDOMINAL HYSTERECTOMY    . BACK SURGERY    . carpal tunnell    . CATARACT EXTRACTION    . CHOLECYSTECTOMY    . COLONOSCOPY WITH ESOPHAGOGASTRODUODENOSCOPY (EGD)    . EYE SURGERY    . JOINT REPLACEMENT    . LUMBAR FUSION    . TOTAL KNEE ARTHROPLASTY    . UPPER ESOPHAGEAL ENDOSCOPIC ULTRASOUND (EUS) N/A 01/03/2016   Procedure: UPPER ESOPHAGEAL ENDOSCOPIC ULTRASOUND (EUS);  Surgeon: Holly Bodily, MD;  Location: [facility] ENDOSCOPY;  Service: Gastroenterology;  Laterality: N/A;    SOCIAL HISTORY:   Social History  Substance Use Topics  . Smoking status: Current Every Day Smoker  . Smokeless tobacco: Never Used  . Alcohol use Not on  file    FAMILY HISTORY:   Family History  Problem Relation Age of Onset  . Diabetes Neg Hx     DRUG ALLERGIES:  No Known Allergies  REVIEW OF SYSTEMS:  REVIEW OF SYSTEMS:  CONSTITUTIONAL: Positive fevers, chills, fatigue, weakness.  EYES: Denies blurred vision, double vision, or eye pain.  EARS, NOSE, THROAT: Denies tinnitus, ear pain, hearing loss.  RESPIRATORY: denies cough, shortness of breath, wheezing  CARDIOVASCULAR: Denies chest pain, palpitations, edema.  GASTROINTESTINAL: Denies nausea, vomiting, diarrhea, abdominal pain.  GENITOURINARY: Denies dysuria, hematuria.  ENDOCRINE: Denies nocturia or thyroid problems. HEMATOLOGIC AND LYMPHATIC: Denies easy bruising or bleeding.  SKIN: Denies rash or lesions.  MUSCULOSKELETAL: Denies pain in neck, back, shoulder, knees, hips, or further arthritic symptoms.  NEUROLOGIC: Denies paralysis, paresthesias.  PSYCHIATRIC: Denies anxiety or depressive symptoms. Otherwise full review of systems performed by me is negative.   MEDICATIONS AT HOME:   Prior to Admission medications   Medication Sig Start Date End Date Taking? Authorizing Provider  aspirin 81 MG tablet Take 81 mg by mouth daily.   Yes Historical Provider, MD  atorvastatin (LIPITOR) 40 MG tablet Take 40 mg by mouth daily.   Yes Historical Provider, MD  azelastine (ASTELIN) 0.1 % nasal spray Place 1 spray into both nostrils 2 (two) times daily as  needed for rhinitis. Use in each nostril as directed   Yes Historical Provider, MD  benazepril-hydrochlorthiazide (LOTENSIN HCT) 20-25 MG tablet Take 1 tablet by mouth daily.   Yes Historical Provider, MD  clonazePAM (KLONOPIN) 0.5 MG tablet Take 0.5 mg by mouth 2 (two) times daily as needed for anxiety.   Yes Historical Provider, MD  gabapentin (NEURONTIN) 300 MG capsule Take 300 mg by mouth 2 (two) times daily.   Yes Historical Provider, MD  oxyCODONE-acetaminophen (PERCOCET/ROXICET) 5-325 MG tablet Take by mouth every 4 (four)  hours as needed for severe pain.   Yes Historical Provider, MD  pantoprazole (PROTONIX) 40 MG tablet Take 40 mg by mouth daily.   Yes Historical Provider, MD  pregabalin (LYRICA) 150 MG capsule Take 150 mg by mouth 2 (two) times daily.   Yes Historical Provider, MD  albuterol-ipratropium (COMBIVENT) 18-103 MCG/ACT inhaler Inhale 1 puff into the lungs every 6 (six) hours as needed for wheezing or shortness of breath.    Historical Provider, MD  ALPRAZolam Duanne Moron) 0.5 MG tablet Take 0.5 mg by mouth at bedtime as needed for anxiety.    Historical Provider, MD  dicyclomine (BENTYL) 10 MG capsule Take 10 mg by mouth 4 (four) times daily -  before meals and at bedtime.    Historical Provider, MD  etodolac (LODINE) 500 MG tablet Take 500 mg by mouth 2 (two) times daily.    Historical Provider, MD  ferrous sulfate 325 (65 FE) MG EC tablet Take 325 mg by mouth 3 (three) times daily with meals.    Historical Provider, MD  fluticasone (FLOVENT HFA) 110 MCG/ACT inhaler Inhale into the lungs 2 (two) times daily.    Historical Provider, MD  latanoprost (XALATAN) 0.005 % ophthalmic solution 1 drop at bedtime.    Historical Provider, MD  metoprolol succinate (TOPROL-XL) 50 MG 24 hr tablet Take 50 mg by mouth daily. Take with or immediately following a meal.    Historical Provider, MD  Na Sulfate-K Sulfate-Mg Sulf (SUPREP BOWEL PREP PO) Take by mouth.    Historical Provider, MD  SUMAtriptan (IMITREX) 50 MG tablet Take 50 mg by mouth every 2 (two) hours as needed for migraine. May repeat in 2 hours if headache persists or recurs.    Historical Provider, MD  tiZANidine (ZANAFLEX) 2 MG tablet Take by mouth every 6 (six) hours as needed for muscle spasms.    Historical Provider, MD  Vitamin D, Ergocalciferol, (DRISDOL) 50000 units CAPS capsule Take 50,000 Units by mouth every 7 (seven) days.    Historical Provider, MD      VITAL SIGNS:  Blood pressure 137/64, pulse 99, temperature 99.8 F (37.7 C), temperature source  Oral, resp. rate (!) 21, height 5\' 4"  (1.626 m), weight 113.9 kg (251 lb), SpO2 99 %.  PHYSICAL EXAMINATION:  VITAL SIGNS: Vitals:   09/30/16 1344 09/30/16 1550  BP: 101/89 137/64  Pulse: (!) 114 99  Resp: (!) 22 (!) 21  Temp: (!) 102.8 F (39.3 C) 99.8 F (37.7 C)   GENERAL:70 y.o.female currently in no acute distress.  HEAD: Normocephalic, atraumatic.  EYES: Pupils equal, round, reactive to light. Extraocular muscles intact. No scleral icterus.  MOUTH: Moist mucosal membrane. Dentition intact. No abscess noted.  EAR, NOSE, THROAT: Clear without exudates. No external lesions.  NECK: Supple. No thyromegaly. No nodules. No JVD.  PULMONARY: Clear to ascultation, without wheeze rails or rhonci. No use of accessory muscles, Good respiratory effort. good air entry bilaterally CHEST: Nontender to palpation.  CARDIOVASCULAR: S1 and S2. Regular rate and rhythm. No murmurs, rubs, or gallops. No edema. Pedal pulses 2+ bilaterally.  GASTROINTESTINAL: Soft, nontender, nondistended. No masses. Positive bowel sounds. No hepatosplenomegaly.  MUSCULOSKELETAL: No swelling, clubbing, or edema. Range of motion full in all extremities.  NEUROLOGIC: Cranial nerves II through XII are intact. No gross focal neurological deficits. Sensation intact. Reflexes intact.  SKIN: No ulceration, lesions, rashes, or cyanosis. Skin warm and dry. Turgor intact.  PSYCHIATRIC: Mood, affect within normal limits. The patient is awake, alert and oriented x 3. Insight, judgment intact.    LABORATORY PANEL:   CBC  Recent Labs Lab 09/30/16 1346  WBC 17.2*  HGB 12.4  HCT 39.0  PLT 259   ------------------------------------------------------------------------------------------------------------------  Chemistries   Recent Labs Lab 09/30/16 1346  NA 136  K 3.6  CL 102  CO2 26  GLUCOSE 123*  BUN <5*  CREATININE 0.61  CALCIUM 8.6*  AST 30  ALT 16  ALKPHOS 82  BILITOT 0.9    ------------------------------------------------------------------------------------------------------------------  Cardiac Enzymes  Recent Labs Lab 09/30/16 1346  TROPONINI 0.03*   ------------------------------------------------------------------------------------------------------------------  RADIOLOGY:  Dg Chest 2 View  Result Date: 09/30/2016 CLINICAL DATA:  Fever and chest pain beginning today. Atrial fibrillation. Asthma. EXAM: CHEST  2 VIEW COMPARISON:  07/06/2007 FINDINGS: Mild cardiomegaly. Aortic atherosclerosis. Both lungs are clear. No evidence of pneumothorax or pleural effusion. IMPRESSION: Mild cardiomegaly.  No active lung disease. Aortic atherosclerosis. Electronically Signed   By: Earle Gell M.D.   On: 09/30/2016 14:47    EKG:   Orders placed or performed during the hospital encounter of 09/30/16  . EKG 12-Lead  . EKG 12-Lead    IMPRESSION AND PLAN:   70 year old African-American female history of COPD non-oxygen requiring his presenting with fever  1.Sepsis, meeting septic criteria by temperature, leukocytosis present on arrival. Source unknown at this timeBroad-spectrum antibiotics including vancomycin/Zosyn and taper antibiotics when culture data returns.   Continue IV fluid hydration to keep mean arterial pressure greater than 65. may require pressor therapy if blood pressure worsens. We will repeat lactic acid if the initial is greater than 2.2.   Influenza negative  2. Essential hypertension: Metoprolol XL, Lotensin 3. COPD: Not in exacerbation, continue home medications     All the records are reviewed and case discussed with ED provider. Management plans discussed with the patient, family and they are in agreement.  CODE STATUS: Full  TOTAL TIME TAKING CARE OF THIS PATIENT: 33 minutes.    Lynn Potter,  Karenann Cai.D on 09/30/2016 at 5:24 PM  Between 7am to 6pm - Pager - [phone]  After 6pm: House Pager: - [phone]  [facility]  [facility]  Office  [phone]  CC: Primary care physician; Vista Mink, FNP

## 2016-10-01 LAB — BASIC METABOLIC PANEL
Anion gap: 6 (ref 5–15)
BUN: 6 mg/dL (ref 6–20)
CALCIUM: 7.9 mg/dL — AB (ref 8.9–10.3)
CHLORIDE: 105 mmol/L (ref 101–111)
CO2: 25 mmol/L (ref 22–32)
CREATININE: 0.69 mg/dL (ref 0.44–1.00)
GFR calc Af Amer: >60 mL/min (ref >60)
GFR calc non Af Amer: >60 mL/min (ref >60)
Glucose, Bld: 102 mg/dL — ABNORMAL HIGH (ref 65–99)
Potassium: 3.5 mmol/L (ref 3.5–5.1)
SODIUM: 136 mmol/L (ref 135–145)

## 2016-10-01 LAB — BLOOD CULTURE ID PANEL (REFLEXED)
ACINETOBACTER BAUMANNII: NOT DETECTED
CANDIDA KRUSEI: NOT DETECTED
CANDIDA PARAPSILOSIS: NOT DETECTED
CANDIDA TROPICALIS: NOT DETECTED
CARBAPENEM RESISTANCE: NOT DETECTED
Candida albicans: NOT DETECTED
Candida glabrata: NOT DETECTED
Enterobacter cloacae complex: NOT DETECTED
Enterobacteriaceae species: DETECTED — AB
Enterococcus species: NOT DETECTED
Escherichia coli: DETECTED — AB
HAEMOPHILUS INFLUENZAE: NOT DETECTED
KLEBSIELLA OXYTOCA: NOT DETECTED
KLEBSIELLA PNEUMONIAE: NOT DETECTED
Listeria monocytogenes: NOT DETECTED
Neisseria meningitidis: NOT DETECTED
PROTEUS SPECIES: NOT DETECTED
PSEUDOMONAS AERUGINOSA: NOT DETECTED
SERRATIA MARCESCENS: NOT DETECTED
STAPHYLOCOCCUS AUREUS BCID: NOT DETECTED
STREPTOCOCCUS PYOGENES: NOT DETECTED
STREPTOCOCCUS SPECIES: NOT DETECTED
Staphylococcus species: NOT DETECTED
Streptococcus agalactiae: NOT DETECTED
Streptococcus pneumoniae: NOT DETECTED

## 2016-10-01 LAB — CBC
HCT: 35.7 % (ref 35.0–47.0)
Hemoglobin: 11.6 g/dL — ABNORMAL LOW (ref 12.0–16.0)
MCH: 26.3 pg (ref 26.0–34.0)
MCHC: 32.5 g/dL (ref 32.0–36.0)
MCV: 81 fL (ref 80.0–100.0)
PLATELETS: 196 10*3/uL (ref 150–440)
RBC: 4.41 MIL/uL (ref 3.80–5.20)
RDW: 15.8 % — AB (ref 11.5–14.5)
WBC: 7.5 10*3/uL (ref 3.6–11.0)

## 2016-10-01 LAB — URINE CULTURE: Culture: NO GROWTH

## 2016-10-01 MED ORDER — MEROPENEM-SODIUM CHLORIDE 1 GM/50ML IV SOLR
1.0000 g | Freq: Three times a day (TID) | INTRAVENOUS | Status: DC
  Administered 2016-10-01 – 2016-10-02 (×3): 1 g via INTRAVENOUS
  Filled 2016-10-01 (×5): qty 50

## 2016-10-01 MED ORDER — SODIUM CHLORIDE 0.9 % IV SOLN
1.0000 g | Freq: Three times a day (TID) | INTRAVENOUS | Status: DC
  Filled 2016-10-01 (×2): qty 1

## 2016-10-01 MED ORDER — SODIUM CHLORIDE 0.9 % IV SOLN
2.0000 g | Freq: Three times a day (TID) | INTRAVENOUS | Status: DC
  Administered 2016-10-01: 09:00:00 2 g via INTRAVENOUS
  Filled 2016-10-01 (×3): qty 2

## 2016-10-01 NOTE — Care Management Important Message (Signed)
Important Message  Patient Details  Name: Lynn Potter MRN: [id number] Date of Birth: 19-May-1946   Medicare Important Message Given:  Yes    Shelbie Ammons, RN 10/01/2016, 11:20 AM

## 2016-10-01 NOTE — Progress Notes (Signed)
PHARMACY - PHYSICIAN COMMUNICATION CRITICAL VALUE ALERT - BLOOD CULTURE IDENTIFICATION (BCID)  Results for orders placed or performed during the hospital encounter of 09/30/16  Blood Culture ID Panel (Reflexed) (Collected: 09/30/2016  1:46 PM)  Result Value Ref Range   Enterococcus species NOT DETECTED NOT DETECTED   Listeria monocytogenes NOT DETECTED NOT DETECTED   Staphylococcus species NOT DETECTED NOT DETECTED   Staphylococcus aureus NOT DETECTED NOT DETECTED   Streptococcus species NOT DETECTED NOT DETECTED   Streptococcus agalactiae NOT DETECTED NOT DETECTED   Streptococcus pneumoniae NOT DETECTED NOT DETECTED   Streptococcus pyogenes NOT DETECTED NOT DETECTED   Acinetobacter baumannii NOT DETECTED NOT DETECTED   Enterobacteriaceae species DETECTED (A) NOT DETECTED   Enterobacter cloacae complex NOT DETECTED NOT DETECTED   Escherichia coli DETECTED (A) NOT DETECTED   Klebsiella oxytoca NOT DETECTED NOT DETECTED   Klebsiella pneumoniae NOT DETECTED NOT DETECTED   Proteus species NOT DETECTED NOT DETECTED   Serratia marcescens NOT DETECTED NOT DETECTED   Carbapenem resistance NOT DETECTED NOT DETECTED   Haemophilus influenzae NOT DETECTED NOT DETECTED   Neisseria meningitidis NOT DETECTED NOT DETECTED   Pseudomonas aeruginosa NOT DETECTED NOT DETECTED   Candida albicans NOT DETECTED NOT DETECTED   Candida glabrata NOT DETECTED NOT DETECTED   Candida krusei NOT DETECTED NOT DETECTED   Candida parapsilosis NOT DETECTED NOT DETECTED   Candida tropicalis NOT DETECTED NOT DETECTED    Name of physician (or Provider) Contacted: Py[place]  Changes to prescribed antibiotics required: Changed to meropenem  [place] Paiz S 10/01/2016  7:01 AM

## 2016-10-01 NOTE — Consult Note (Signed)
[facility] responded to OR for AD information. PT was awake but not overly responsive when [facility] went to visit. PT was aware that she had requested information. [facility] left information at bedside and told patient that when she was ready to talk through it, a [facility] would be available.

## 2016-10-01 NOTE — Progress Notes (Signed)
Pharmacy Antibiotic Note  Lynn Potter is a 70 y.o. female admitted on 09/30/2016 with sepsis of unknown source.  Pharmacy has been consulted for meropenem dosing. Patient with GNR (E coli per BCID) in 1/2 BCx.   Plan: Will change meropenem to 1 g iv q 8 hours.   Height: 5\' 4"  (162.6 cm) Weight: 251 lb (113.9 kg) IBW/kg (Calculated) : 54.7  Temp (24hrs), Avg:99.6 F (37.6 C), Min:98.1 F (36.7 C), Max:102.8 F (39.3 C)   Recent Labs Lab 09/30/16 1346 09/30/16 1907 10/01/16 0533  WBC 17.2*  --  7.5  CREATININE 0.61  --  0.69  LATICACIDVEN 2.5* 1.9  --     Estimated Creatinine Clearance: 79.8 mL/min (by C-G formula based on SCr of 0.69 mg/dL).    No Known Allergies  Antimicrobials this admission: vancomycin 12/26 >> 12/27 Piperacillin/tazobactam 12/26 >> 12/27 Meropenem 12/27 >>  Dose adjustments this admission:  Microbiology results: 12/26 BCx: GNR- E coli 1/2 12/26 UCx: Sent   Thank you for allowing pharmacy to be a part of this patient's care.  Ulice Dash D, PharmD Clinical Pharmacist 10/01/2016 12:55 PM

## 2016-10-01 NOTE — Progress Notes (Signed)
[facility] responded to a referral from [facility] for an AD. Pt had not had time to review and wanted more time for her daughter to look it over with her. Son was bedside and indicated he would like information for himself as well. [place] provided information. [facility] provided the ministry of education and prayer. CH is available for follow up as needed.    10/01/16 1300  Clinical Encounter Type  Visited With Patient;Patient and family together  Visit Type Initial;Spiritual support  Referral From [facility]

## 2016-10-01 NOTE — Progress Notes (Signed)
[place] at [place] NAME: Lynn Potter    MR#:  [id number]  DATE OF BIRTH:  Feb 12, 1946  SUBJECTIVE:   Came in with I fever and chills. Found to have positive blood culture with Escherichia coli. REVIEW OF SYSTEMS:   Review of Systems  Constitutional: Positive for chills and fever. Negative for weight loss.  HENT: Negative for ear discharge, ear pain and nosebleeds.   Eyes: Negative for blurred vision, pain and discharge.  Respiratory: Negative for sputum production, shortness of breath, wheezing and stridor.   Cardiovascular: Negative for chest pain, palpitations, orthopnea and PND.  Gastrointestinal: Negative for abdominal pain, diarrhea, nausea and vomiting.  Genitourinary: Negative for frequency and urgency.  Musculoskeletal: Negative for back pain and joint pain.  Neurological: Positive for weakness. Negative for sensory change, speech change and focal weakness.  Psychiatric/Behavioral: Negative for depression and hallucinations. The patient is not nervous/anxious.    Tolerating Diet: Tolerating PT:   DRUG ALLERGIES:  No Known Allergies  VITALS:  Blood pressure 117/74, pulse 76, temperature 98.2 F (36.8 C), temperature source Oral, resp. rate 18, height 5\' 4"  (1.626 m), weight 113.9 kg (251 lb), SpO2 95 %.  PHYSICAL EXAMINATION:   Physical Exam  GENERAL:  70 y.o.-year-old patient lying in the bed with no acute distress. Obese EYES: Pupils equal, round, reactive to light and accommodation. No scleral icterus. Extraocular muscles intact.  HEENT: Head atraumatic, normocephalic. Oropharynx and nasopharynx clear.  NECK:  Supple, no jugular venous distention. No thyroid enlargement, no tenderness.  LUNGS: Normal breath sounds bilaterally, no wheezing, rales, rhonchi. No use of accessory muscles of respiration.  CARDIOVASCULAR: S1, S2 normal. No murmurs, rubs, or gallops.  ABDOMEN: Soft, nontender, nondistended. Bowel sounds  present. No organomegaly or mass.  EXTREMITIES: No cyanosis, clubbing or edema b/l.    NEUROLOGIC: Cranial nerves II through XII are intact. No focal Motor or sensory deficits b/l.   PSYCHIATRIC:  patient is alert and oriented x 3.  SKIN: No obvious rash, lesion, or ulcer.   LABORATORY PANEL:  CBC  Recent Labs Lab 10/01/16 0533  WBC 7.5  HGB 11.6*  HCT 35.7  PLT 196    Chemistries   Recent Labs Lab 09/30/16 1346 10/01/16 0533  NA 136 136  K 3.6 3.5  CL 102 105  CO2 26 25  GLUCOSE 123* 102*  BUN <5* 6  CREATININE 0.61 0.69  CALCIUM 8.6* 7.9*  AST 30  --   ALT 16  --   ALKPHOS 82  --   BILITOT 0.9  --    Cardiac Enzymes  Recent Labs Lab 09/30/16 1346  TROPONINI 0.03*   RADIOLOGY:  Dg Chest 2 View  Result Date: 09/30/2016 CLINICAL DATA:  Fever and chest pain beginning today. Atrial fibrillation. Asthma. EXAM: CHEST  2 VIEW COMPARISON:  07/06/2007 FINDINGS: Mild cardiomegaly. Aortic atherosclerosis. Both lungs are clear. No evidence of pneumothorax or pleural effusion. IMPRESSION: Mild cardiomegaly.  No active lung disease. Aortic atherosclerosis. Electronically Signed   By: Earle Gell M.D.   On: 09/30/2016 14:47   ASSESSMENT AND PLAN:  Lynn Potter  is a 70 y.o. female with a known history of COPD non-oxygen requiring who is presenting with fever chills. She was in usual state of health describes one day duration of fever with shaking chills, cough no change from baseline, denies any further symptomatology at this time.  1.Sepsis, meeting septic criteria by temperature, leukocytosis present on arrival. - Source unknown  at this time -BC positve for E coli -IV meropenem -d/c vanc -Influenza negative -UA not impressive -await UC  2. Essential hypertension: Metoprolol XL, Lotensin  3. COPD: Not in exacerbation, continue home medications  Case discussed with Care Management/Social Worker. Management plans discussed with the patient, family and they are  in agreement.  CODE STATUS: full  DVT Prophylaxis: lovenox  TOTAL TIME TAKING CARE OF THIS PATIENT:30 minutes.  >50% time spent on counselling and coordination of care  POSSIBLE D/C IN 1-2 DAYS, DEPENDING ON CLINICAL CONDITION.  Note: This dictation was prepared with Dragon dictation along with smaller phrase technology. Any transcriptional errors that result from this process are unintentional.  Laurens Matheny M.D on 10/01/2016 at 12:28 PM  Between 7am to 6pm - Pager - [phone]  After 6pm go to [email or web address] - password [email or web address] [facility]  Office  [phone]  CC: Primary care physician; Vista Mink, FNP

## 2016-10-02 ENCOUNTER — Telehealth: Payer: Self-pay | Admitting: Pharmacist

## 2016-10-02 LAB — PROCALCITONIN: PROCALCITONIN: 7.45 ng/mL

## 2016-10-02 MED ORDER — CEPHALEXIN 500 MG PO CAPS
500.0000 mg | ORAL_CAPSULE | Freq: Three times a day (TID) | ORAL | Status: DC
  Administered 2016-10-02: 500 mg via ORAL
  Filled 2016-10-02: qty 1

## 2016-10-02 MED ORDER — CEPHALEXIN 500 MG PO CAPS: 500.0000 mg | ORAL_CAPSULE | Freq: Three times a day (TID) | ORAL | 0 refills | Status: DC

## 2016-10-02 NOTE — Discharge Summary (Signed)
[facility] NAME: Lynn Potter    MR#:  [id number]  DATE OF BIRTH:  [birth year]  DATE OF ADMISSION:  09/30/2016 ADMITTING PHYSICIAN: Lytle Butte, MD  DATE OF DISCHARGE: 10/02/16  PRIMARY CARE PHYSICIAN: Vista Mink, FNP    ADMISSION DIAGNOSIS:  Sepsis, due to unspecified organism ([place]) [A41.9]  DISCHARGE DIAGNOSIS:  E coli sepsis source unknown  SECONDARY DIAGNOSIS:   Past Medical History:  Diagnosis Date  . AF (atrial fibrillation) ([place])   . Arthritis   . Asthma   . Chicken pox   . Glaucoma   . Hemorrhoids   . History of hiatal hernia   . Hypertension   . Migraines   . Obesity   . Personal history of tobacco use, presenting hazards to health 06/18/2015  . Sleep apnea     HOSPITAL COURSE:  Lynn Potter a 70 y.o.femalewith a known history of COPD non-oxygen requiring who is presenting with fever chills. She was in usual state of health describes one day duration of fever with shaking chills, cough no change from baseline, denies any further symptomatology at this time.  1.Sepsis, meeting septic criteria by temperature, leukocytosispresent on arrival. - Source unknown at this time -BC positve for E coli -IV meropenem---changed to po keflex tid -d/c vanc -Influenza negative -UA not impressive - UC so far negative -no diarrhea -wbc 7.2, LA resolved -vitals stable  2. Essential hypertension: Metoprolol XL, Lotensin  3. COPD: Not in exacerbation, continue home medications  4. Smoking cessation advised 3 mins spent  Ambulate D/c home later today Pt agreeable CONSULTS OBTAINED:  Treatment Team:  Lytle Butte, MD  DRUG ALLERGIES:  No Known Allergies  DISCHARGE MEDICATIONS:   Current Discharge Medication List    START taking these medications   Details  cephALEXin (KEFLEX) 500 MG capsule Take 1 capsule (500 mg total) by mouth 3 (three) times daily. Qty: 21 capsule,  Refills: 0      CONTINUE these medications which have NOT CHANGED   Details  albuterol-ipratropium (COMBIVENT) 18-103 MCG/ACT inhaler Inhale 1 puff into the lungs every 6 (six) hours as needed for wheezing or shortness of breath.    ALPRAZolam (XANAX) 0.5 MG tablet Take 0.5 mg by mouth at bedtime as needed for anxiety.    aspirin 81 MG tablet Take 81 mg by mouth daily.    atorvastatin (LIPITOR) 40 MG tablet Take 40 mg by mouth daily.    azelastine (ASTELIN) 0.1 % nasal spray Place 1 spray into both nostrils 2 (two) times daily as needed for rhinitis. Use in each nostril as directed    benazepril-hydrochlorthiazide (LOTENSIN HCT) 20-25 MG tablet Take 1 tablet by mouth daily.    clonazePAM (KLONOPIN) 0.5 MG tablet Take 0.5 mg by mouth 2 (two) times daily as needed for anxiety.    ferrous sulfate 325 (65 FE) MG EC tablet Take 325 mg by mouth 3 (three) times daily with meals.    gabapentin (NEURONTIN) 300 MG capsule Take 300 mg by mouth 2 (two) times daily.    latanoprost (XALATAN) 0.005 % ophthalmic solution 1 drop at bedtime.    oxyCODONE-acetaminophen (PERCOCET/ROXICET) 5-325 MG tablet Take by mouth every 4 (four) hours as needed for severe pain.    pantoprazole (PROTONIX) 40 MG tablet Take 40 mg by mouth daily.    pregabalin (LYRICA) 150 MG capsule Take 150 mg by mouth 2 (two) times daily.    dicyclomine (BENTYL) 10 MG capsule Take  10 mg by mouth 4 (four) times daily -  before meals and at bedtime.    etodolac (LODINE) 500 MG tablet Take 500 mg by mouth 2 (two) times daily.    fluticasone (FLOVENT HFA) 110 MCG/ACT inhaler Inhale into the lungs 2 (two) times daily.    metoprolol succinate (TOPROL-XL) 50 MG 24 hr tablet Take 50 mg by mouth daily. Take with or immediately following a meal.    Na Sulfate-K Sulfate-Mg Sulf (SUPREP BOWEL PREP PO) Take by mouth.    SUMAtriptan (IMITREX) 50 MG tablet Take 50 mg by mouth every 2 (two) hours as needed for migraine. May repeat in 2  hours if headache persists or recurs.    tiZANidine (ZANAFLEX) 2 MG tablet Take by mouth every 6 (six) hours as needed for muscle spasms.    Vitamin D, Ergocalciferol, (DRISDOL) 50000 units CAPS capsule Take 50,000 Units by mouth every 7 (seven) days.        If you experience worsening of your admission symptoms, develop shortness of breath, life threatening emergency, suicidal or homicidal thoughts you must seek medical attention immediately by calling 911 or calling your MD immediately  if symptoms less severe.  You Must read complete instructions/literature along with all the possible adverse reactions/side effects for all the Medicines you take and that have been prescribed to you. Take any new Medicines after you have completely understood and accept all the possible adverse reactions/side effects.   Please note  You were cared for by a hospitalist during your hospital stay. If you have any questions about your discharge medications or the care you received while you were in the hospital after you are discharged, you can call the unit and asked to speak with the hospitalist on call if the hospitalist that took care of you is not available. Once you are discharged, your primary care physician will handle any further medical issues. Please note that NO REFILLS for any discharge medications will be authorized once you are discharged, as it is imperative that you return to your primary care physician (or establish a relationship with a primary care physician if you do not have one) for your aftercare needs so that they can reassess your need for medications and monitor your lab values. Today   SUBJECTIVE   Chronic cough  VITAL SIGNS:  Blood pressure 137/68, pulse 69, temperature 98.4 F (36.9 C), temperature source Oral, resp. rate 18, height 5\' 4"  (1.626 m), weight 113.9 kg (251 lb), SpO2 99 %.  I/O:   Intake/Output Summary (Last 24 hours) at 10/02/16 1348 Last data filed at 10/02/16  1012  Gross per 24 hour  Intake             2255 ml  Output              400 ml  Net             1855 ml    PHYSICAL EXAMINATION:  GENERAL:  70 y.o.-year-old patient lying in the bed with no acute distress. Obese EYES: Pupils equal, round, reactive to light and accommodation. No scleral icterus. Extraocular muscles intact.  HEENT: Head atraumatic, normocephalic. Oropharynx and nasopharynx clear.  NECK:  Supple, no jugular venous distention. No thyroid enlargement, no tenderness.  LUNGS: Normal breath sounds bilaterally, no wheezing, rales,rhonchi or crepitation. No use of accessory muscles of respiration.  CARDIOVASCULAR: S1, S2 normal. No murmurs, rubs, or gallops.  ABDOMEN: Soft, non-tender, non-distended. Bowel sounds present. No organomegaly or mass.  EXTREMITIES: No pedal edema, cyanosis, or clubbing.  NEUROLOGIC: Cranial nerves II through XII are intact. Muscle strength 5/5 in all extremities. Sensation intact. Gait not checked.  PSYCHIATRIC: The patient is alert and oriented x 3.  SKIN: No obvious rash, lesion, or ulcer.   DATA REVIEW:   CBC   Recent Labs Lab 10/01/16 0533  WBC 7.5  HGB 11.6*  HCT 35.7  PLT 196    Chemistries   Recent Labs Lab 09/30/16 1346 10/01/16 0533  NA 136 136  K 3.6 3.5  CL 102 105  CO2 26 25  GLUCOSE 123* 102*  BUN <5* 6  CREATININE 0.61 0.69  CALCIUM 8.6* 7.9*  AST 30  --   ALT 16  --   ALKPHOS 82  --   BILITOT 0.9  --     Microbiology Results   Recent Results (from the past 240 hour(s))  Culture, blood (Routine x 2)     Status: None (Preliminary result)   Collection Time: 09/30/16  1:46 PM  Result Value Ref Range Status   Specimen Description BLOOD LEFT HAND  Final   Special Requests   Final    BOTTLES DRAWN AEROBIC AND ANAEROBIC AER 2ML ANA 1ML   Culture NO GROWTH 2 DAYS  Final   Report Status PENDING  Incomplete  Culture, blood (Routine x 2)     Status: None (Preliminary result)   Collection Time: 09/30/16  1:46 PM   Result Value Ref Range Status   Specimen Description BLOOD LEFT AC  Final   Special Requests   Final    BOTTLES DRAWN AEROBIC AND ANAEROBIC AER 22ML ANA 16ML   Culture  Setup Time   Final    Organism ID to follow GRAM NEGATIVE RODS IN BOTH AEROBIC AND ANAEROBIC BOTTLES CRITICAL RESULT CALLED TO, READ BACK BY AND VERIFIED WITH: MATT MCBANE AT CJ:[id number] ON 10/01/16 [place].    Culture   Final    GRAM NEGATIVE RODS CULTURE REINCUBATED FOR BETTER GROWTH Performed at [facility]    Report Status PENDING  Incomplete  Blood Culture ID Panel (Reflexed)     Status: Abnormal   Collection Time: 09/30/16  1:46 PM  Result Value Ref Range Status   Enterococcus species NOT DETECTED NOT DETECTED Final   Listeria monocytogenes NOT DETECTED NOT DETECTED Final   Staphylococcus species NOT DETECTED NOT DETECTED Final   Staphylococcus aureus NOT DETECTED NOT DETECTED Final   Streptococcus species NOT DETECTED NOT DETECTED Final   Streptococcus agalactiae NOT DETECTED NOT DETECTED Final   Streptococcus pneumoniae NOT DETECTED NOT DETECTED Final   Streptococcus pyogenes NOT DETECTED NOT DETECTED Final   Acinetobacter baumannii NOT DETECTED NOT DETECTED Final   Enterobacteriaceae species DETECTED (A) NOT DETECTED Final    Comment: CRITICAL RESULT CALLED TO, READ BACK BY AND VERIFIED WITH: MATT MCBANE AT CJ:[id number] ON 10/01/16 [place].    Enterobacter cloacae complex NOT DETECTED NOT DETECTED Final   Escherichia coli DETECTED (A) NOT DETECTED Final    Comment: CRITICAL RESULT CALLED TO, READ BACK BY AND VERIFIED WITH: MATT MCBANE AT CJ:[id number] ON 10/01/16 [place].    Klebsiella oxytoca NOT DETECTED NOT DETECTED Final   Klebsiella pneumoniae NOT DETECTED NOT DETECTED Final   Proteus species NOT DETECTED NOT DETECTED Final   Serratia marcescens NOT DETECTED NOT DETECTED Final   Carbapenem resistance NOT DETECTED NOT DETECTED Final   Haemophilus influenzae NOT DETECTED NOT DETECTED Final   Neisseria meningitidis NOT  DETECTED NOT DETECTED Final  Pseudomonas aeruginosa NOT DETECTED NOT DETECTED Final   Candida albicans NOT DETECTED NOT DETECTED Final   Candida glabrata NOT DETECTED NOT DETECTED Final   Candida krusei NOT DETECTED NOT DETECTED Final   Candida parapsilosis NOT DETECTED NOT DETECTED Final   Candida tropicalis NOT DETECTED NOT DETECTED Final  Urine culture     Status: None   Collection Time: 09/30/16  3:27 PM  Result Value Ref Range Status   Specimen Description URINE, RANDOM  Final   Special Requests NONE  Final   Culture NO GROWTH Performed at [facility]   Final   Report Status 10/01/2016 FINAL  Final    RADIOLOGY:  Dg Chest 2 View  Result Date: 09/30/2016 CLINICAL DATA:  Fever and chest pain beginning today. Atrial fibrillation. Asthma. EXAM: CHEST  2 VIEW COMPARISON:  07/06/2007 FINDINGS: Mild cardiomegaly. Aortic atherosclerosis. Both lungs are clear. No evidence of pneumothorax or pleural effusion. IMPRESSION: Mild cardiomegaly.  No active lung disease. Aortic atherosclerosis. Electronically Signed   By: Earle Gell M.D.   On: 09/30/2016 14:47     Management plans discussed with the patient, family and they are in agreement.  CODE STATUS:     Code Status Orders        Start     Ordered   09/30/16 1713  Full code  Continuous     09/30/16 1713    Code Status History    Date Active Date Inactive Code Status Order ID Comments User Context   This patient has a current code status but no historical code status.      TOTAL TIME TAKING CARE OF THIS PATIENT: 40 minutes.    Nicoletta Hush M.D on 10/02/2016 at 1:48 PM  Between 7am to 6pm - Pager - [phone] After 6pm go to [email or web address] - password [email or web address] [facility]  Office  [phone]  CC: Primary care physician; Vista Mink, FNP

## 2016-10-02 NOTE — Progress Notes (Signed)
Discharge instructions along with home medications and follow up gone over with patient and daughter. Both verbalize that they understood instructions. No prescriptions given to patient. IV removed. Pt being discharged home on room air, no distress noted. Keely Drennan S Fenton, RN 

## 2016-10-02 NOTE — Discharge Instructions (Signed)
Stop smoking

## 2016-10-03 LAB — CULTURE, BLOOD (ROUTINE X 2)

## 2016-10-03 NOTE — Telephone Encounter (Signed)
Error

## 2016-10-05 LAB — CULTURE, BLOOD (ROUTINE X 2): CULTURE: NO GROWTH

## 2016-10-17 ENCOUNTER — Telehealth: Payer: Self-pay | Admitting: *Deleted

## 2016-10-17 ENCOUNTER — Other Ambulatory Visit: Payer: Self-pay | Admitting: *Deleted

## 2016-10-17 DIAGNOSIS — Z87891 Personal history of nicotine dependence: Secondary | ICD-10-CM

## 2016-10-17 NOTE — Telephone Encounter (Signed)
Left voicemail for patient notifyng them that it is time to schedule annual low dose lung cancer screening CT scan. Instructed patient to call back to verify information prior to the scan being scheduled.  

## 2016-10-17 NOTE — Telephone Encounter (Signed)
Notified patient that annual lung cancer screening low dose CT scan is due. Confirmed that patient is within the age range of 55-77, and asymptomatic, (no signs or symptoms of lung cancer). Patient denies illness that would prevent curative treatment for lung cancer if found. The patient is a current smoker, with a 52 pack year history. The shared decision making visit was done 03/27/14. Patient is agreeable for CT scan being scheduled.

## 2016-11-04 ENCOUNTER — Ambulatory Visit: Admission: RE | Admit: 2016-11-04 | Payer: Medicare Other | Source: Ambulatory Visit

## 2016-11-20 ENCOUNTER — Ambulatory Visit
Admission: RE | Admit: 2016-11-20 | Discharge: 2016-11-20 | Disposition: A | Discharge status: Home / Self Care | Payer: Medicare Other | Source: Ambulatory Visit | Attending: Oncology | Admitting: Oncology

## 2016-11-20 DIAGNOSIS — N6310 Unspecified lump in the right breast, unspecified quadrant: Secondary | ICD-10-CM | POA: Diagnosis not present

## 2016-11-20 DIAGNOSIS — K449 Diaphragmatic hernia without obstruction or gangrene: Secondary | ICD-10-CM | POA: Diagnosis not present

## 2016-11-20 DIAGNOSIS — I700 Atherosclerosis of aorta: Secondary | ICD-10-CM | POA: Insufficient documentation

## 2016-11-20 DIAGNOSIS — K760 Fatty (change of) liver, not elsewhere classified: Secondary | ICD-10-CM | POA: Diagnosis not present

## 2016-11-20 DIAGNOSIS — Z87891 Personal history of nicotine dependence: Secondary | ICD-10-CM | POA: Diagnosis present

## 2016-11-21 ENCOUNTER — Encounter: Payer: Self-pay | Admitting: *Deleted

## 2017-02-16 ENCOUNTER — Ambulatory Visit: Payer: Medicare Other | Admitting: Anesthesiology

## 2017-02-16 ENCOUNTER — Encounter
Admission: RE | Disposition: A | Discharge status: Home / Self Care | Payer: Self-pay | Source: Ambulatory Visit | Attending: Unknown Physician Specialty

## 2017-02-16 ENCOUNTER — Ambulatory Visit
Admission: RE | Admit: 2017-02-16 | Discharge: 2017-02-16 | Disposition: A | Discharge status: Home / Self Care | Payer: Medicare Other | Source: Ambulatory Visit | Attending: Unknown Physician Specialty | Admitting: Unknown Physician Specialty

## 2017-02-16 DIAGNOSIS — Z7982 Long term (current) use of aspirin: Secondary | ICD-10-CM | POA: Insufficient documentation

## 2017-02-16 DIAGNOSIS — Z1211 Encounter for screening for malignant neoplasm of colon: Secondary | ICD-10-CM | POA: Insufficient documentation

## 2017-02-16 DIAGNOSIS — G43909 Migraine, unspecified, not intractable, without status migrainosus: Secondary | ICD-10-CM | POA: Diagnosis not present

## 2017-02-16 DIAGNOSIS — Z85060 Personal history of malignant carcinoid tumor of small intestine: Secondary | ICD-10-CM | POA: Insufficient documentation

## 2017-02-16 DIAGNOSIS — Z9849 Cataract extraction status, unspecified eye: Secondary | ICD-10-CM | POA: Diagnosis not present

## 2017-02-16 DIAGNOSIS — I10 Essential (primary) hypertension: Secondary | ICD-10-CM | POA: Diagnosis not present

## 2017-02-16 DIAGNOSIS — F17210 Nicotine dependence, cigarettes, uncomplicated: Secondary | ICD-10-CM | POA: Insufficient documentation

## 2017-02-16 DIAGNOSIS — K449 Diaphragmatic hernia without obstruction or gangrene: Secondary | ICD-10-CM | POA: Diagnosis not present

## 2017-02-16 DIAGNOSIS — Z96659 Presence of unspecified artificial knee joint: Secondary | ICD-10-CM | POA: Insufficient documentation

## 2017-02-16 DIAGNOSIS — H409 Unspecified glaucoma: Secondary | ICD-10-CM | POA: Diagnosis not present

## 2017-02-16 DIAGNOSIS — M1990 Unspecified osteoarthritis, unspecified site: Secondary | ICD-10-CM | POA: Insufficient documentation

## 2017-02-16 DIAGNOSIS — E669 Obesity, unspecified: Secondary | ICD-10-CM | POA: Insufficient documentation

## 2017-02-16 DIAGNOSIS — Z90710 Acquired absence of both cervix and uterus: Secondary | ICD-10-CM | POA: Diagnosis not present

## 2017-02-16 DIAGNOSIS — J45909 Unspecified asthma, uncomplicated: Secondary | ICD-10-CM | POA: Diagnosis not present

## 2017-02-16 DIAGNOSIS — G4730 Sleep apnea, unspecified: Secondary | ICD-10-CM | POA: Insufficient documentation

## 2017-02-16 DIAGNOSIS — Z981 Arthrodesis status: Secondary | ICD-10-CM | POA: Diagnosis not present

## 2017-02-16 DIAGNOSIS — Z6835 Body mass index (BMI) 35.0-35.9, adult: Secondary | ICD-10-CM | POA: Diagnosis not present

## 2017-02-16 DIAGNOSIS — I4891 Unspecified atrial fibrillation: Secondary | ICD-10-CM | POA: Insufficient documentation

## 2017-02-16 DIAGNOSIS — Z79899 Other long term (current) drug therapy: Secondary | ICD-10-CM | POA: Insufficient documentation

## 2017-02-16 HISTORY — PX: ESOPHAGOGASTRODUODENOSCOPY (EGD) WITH PROPOFOL: SHX5813

## 2017-02-16 SURGERY — ESOPHAGOGASTRODUODENOSCOPY (EGD) WITH PROPOFOL
Anesthesia: General

## 2017-02-16 MED ORDER — PROPOFOL 10 MG/ML IV BOLUS
INTRAVENOUS | Status: DC | PRN
  Administered 2017-02-16: 25 mg via INTRAVENOUS
  Administered 2017-02-16: 50 mg via INTRAVENOUS

## 2017-02-16 MED ORDER — PROPOFOL 500 MG/50ML IV EMUL
INTRAVENOUS | Status: DC | PRN
  Administered 2017-02-16: 125 ug/kg/min via INTRAVENOUS

## 2017-02-16 MED ORDER — SODIUM CHLORIDE 0.9 % IV SOLN
INTRAVENOUS | Status: DC
  Administered 2017-02-16: 1000 mL via INTRAVENOUS
  Administered 2017-02-16: 14:00:00 via INTRAVENOUS

## 2017-02-16 MED ORDER — PROPOFOL 500 MG/50ML IV EMUL
INTRAVENOUS | Status: AC
  Filled 2017-02-16: qty 50

## 2017-02-16 MED ORDER — SODIUM CHLORIDE 0.9 % IV SOLN: INTRAVENOUS | Status: DC

## 2017-02-16 NOTE — Transfer of Care (Signed)
Immediate Anesthesia Transfer of Care Note  Patient: Lynn Potter  Procedure(s) Performed: Procedure(s): ESOPHAGOGASTRODUODENOSCOPY (EGD) WITH PROPOFOL (N/A)  Patient Location: PACU and Endoscopy Unit  Anesthesia Type:General  Level of Consciousness: awake, alert  and oriented  Airway & Oxygen Therapy: Patient Spontanous Breathing and Patient connected to nasal cannula oxygen  Post-op Assessment: Report given to RN and Post -op Vital signs reviewed and stable  Post vital signs: Reviewed and stable  Last Vitals:  Vitals:   02/16/17 1256 02/16/17 1354  BP: (!) 181/96   Pulse: 72 87  Resp: 18 15  Temp: (!) 35.8 C (!) 35.7 C    Last Pain:  Vitals:   02/16/17 1354  TempSrc: Tympanic      Patients Stated Pain Goal: 0 (07/08/48 6116)  Complications: No apparent anesthesia complications

## 2017-02-16 NOTE — Anesthesia Postprocedure Evaluation (Signed)
Anesthesia Post Note  Patient: GARIELLE MROZ  Procedure(s) Performed: Procedure(s) (LRB): ESOPHAGOGASTRODUODENOSCOPY (EGD) WITH PROPOFOL (N/A)  Patient location during evaluation: Endoscopy Anesthesia Type: General Level of consciousness: awake and alert Pain management: pain level controlled Vital Signs Assessment: post-procedure vital signs reviewed and stable Respiratory status: spontaneous breathing and respiratory function stable Cardiovascular status: stable Anesthetic complications: no     Last Vitals:  Vitals:   02/16/17 1256 02/16/17 1354  BP: (!) 181/96   Pulse: 72 87  Resp: 18 15  Temp: (!) 35.8 C (!) 35.7 C    Last Pain:  Vitals:   02/16/17 1354  TempSrc: Tympanic                 KEPHART,WILLIAM K

## 2017-02-16 NOTE — Anesthesia Post-op Follow-up Note (Cosign Needed)
Anesthesia QCDR form completed.        

## 2017-02-16 NOTE — Anesthesia Preprocedure Evaluation (Signed)
Anesthesia Evaluation  Patient identified by MRN, date of birth, ID band Patient awake    Reviewed: Allergy & Precautions, NPO status , Patient's Chart, lab work & pertinent test results  History of Anesthesia Complications Negative for: history of anesthetic complications  Airway Mallampati: II       Dental  (+) Upper Dentures, Lower Dentures   Pulmonary asthma , sleep apnea and Continuous Positive Airway Pressure Ventilation , Current Smoker,           Cardiovascular hypertension, Pt. on medications and Pt. on home beta blockers      Neuro/Psych negative neurological ROS     GI/Hepatic Neg liver ROS, hiatal hernia, GERD  Medicated and Controlled,  Endo/Other  negative endocrine ROS  Renal/GU negative Renal ROS     Musculoskeletal   Abdominal   Peds  Hematology negative hematology ROS (+)   Anesthesia Other Findings   Reproductive/Obstetrics                             Anesthesia Physical Anesthesia Plan  ASA: III  Anesthesia Plan: General   Post-op Pain Management:    Induction: Intravenous  Airway Management Planned: Nasal Cannula  Additional Equipment:   Intra-op Plan:   Post-operative Plan:   Informed Consent: I have reviewed the patients History and Physical, chart, labs and discussed the procedure including the risks, benefits and alternatives for the proposed anesthesia with the patient or authorized representative who has indicated his/her understanding and acceptance.     Plan Discussed with:   Anesthesia Plan Comments:         Anesthesia Quick Evaluation

## 2017-02-16 NOTE — H&P (Signed)
Primary Care Physician:  System, Pcp Not In Primary Gastroenterologist:  Dr. Vira Agar  Pre-Procedure History & Physical: HPI:  Lynn Potter is a 71 y.o. female is here for an endoscopy.   Past Medical History:  Diagnosis Date  . AF (atrial fibrillation) ([place])   . Arthritis   . Asthma   . Chicken pox   . Glaucoma   . Hemorrhoids   . History of hiatal hernia   . Hypertension   . Migraines   . Obesity   . Personal history of tobacco use, presenting hazards to health 06/18/2015  . Sleep apnea     Past Surgical History:  Procedure Laterality Date  . ABDOMINAL HYSTERECTOMY    . BACK SURGERY    . carpal tunnell    . CATARACT EXTRACTION    . CHOLECYSTECTOMY    . COLONOSCOPY WITH ESOPHAGOGASTRODUODENOSCOPY (EGD)    . EYE SURGERY    . JOINT REPLACEMENT    . LUMBAR FUSION    . TOTAL KNEE ARTHROPLASTY    . UPPER ESOPHAGEAL ENDOSCOPIC ULTRASOUND (EUS) N/A 01/03/2016   Procedure: UPPER ESOPHAGEAL ENDOSCOPIC ULTRASOUND (EUS);  Surgeon: Holly Bodily, MD;  Location: [facility] ENDOSCOPY;  Service: Gastroenterology;  Laterality: N/A;    Prior to Admission medications   Medication Sig Start Date End Date Taking? Authorizing Provider  albuterol-ipratropium (COMBIVENT) 18-103 MCG/ACT inhaler Inhale 1 puff into the lungs every 6 (six) hours as needed for wheezing or shortness of breath.   Yes [provider]  ALPRAZolam Duanne Moron) 0.5 MG tablet Take 0.5 mg by mouth at bedtime as needed for anxiety.   Yes [provider]  aspirin 81 MG tablet Take 81 mg by mouth daily.   Yes [provider]  atorvastatin (LIPITOR) 40 MG tablet Take 40 mg by mouth daily.   Yes [provider]  azelastine (ASTELIN) 0.1 % nasal spray Place 1 spray into both nostrils 2 (two) times daily as needed for rhinitis. Use in each nostril as directed   Yes [provider]  benazepril-hydrochlorthiazide (LOTENSIN HCT) 20-25 MG tablet Take 1 tablet by mouth daily.   Yes [provider]  dicyclomine (BENTYL) 10 MG capsule Take 10 mg by mouth 4 (four) times daily -  before meals and at bedtime.   Yes [provider]  etodolac (LODINE) 500 MG tablet Take 500 mg by mouth 2 (two) times daily.   Yes [provider]  ferrous sulfate 325 (65 FE) MG EC tablet Take 325 mg by mouth 3 (three) times daily with meals.   Yes [provider]  gabapentin (NEURONTIN) 300 MG capsule Take 300 mg by mouth 2 (two) times daily.   Yes [provider]  latanoprost (XALATAN) 0.005 % ophthalmic solution 1 drop at bedtime.   Yes [provider]  metoprolol succinate (TOPROL-XL) 50 MG 24 hr tablet Take 50 mg by mouth daily. Take with or immediately following a meal.   Yes [provider]  oxyCODONE-acetaminophen (PERCOCET/ROXICET) 5-325 MG tablet Take by mouth every 4 (four) hours as needed for severe pain.   Yes [provider]  pantoprazole (PROTONIX) 40 MG tablet Take 40 mg by mouth daily.   Yes [provider]  pregabalin (LYRICA) 150 MG capsule Take 150 mg by mouth 2 (two) times daily.   Yes [provider]  SUMAtriptan (IMITREX) 50 MG tablet Take 50 mg by mouth every 2 (two) hours as needed for migraine. May repeat in 2 hours if headache  persists or recurs.   Yes [provider]  tiZANidine (ZANAFLEX) 2 MG tablet Take by mouth every 6 (six) hours as needed for muscle spasms.   Yes [provider]  Vitamin D, Ergocalciferol, (DRISDOL) 50000 units CAPS capsule Take 50,000 Units by mouth every 7 (seven) days.   Yes [provider]  cephALEXin (KEFLEX) 500 MG capsule Take 1 capsule (500 mg total) by mouth 3 (three) times daily. Patient not taking: Reported on 02/16/2017 10/02/16   Fritzi Mandes, MD  clonazePAM (KLONOPIN) 0.5 MG tablet Take 0.5 mg by mouth 2 (two) times daily as needed for anxiety.    [provider]  fluticasone (FLOVENT HFA) 110 MCG/ACT inhaler Inhale into the  lungs 2 (two) times daily.    [provider]  Na Sulfate-K Sulfate-Mg Sulf (SUPREP BOWEL PREP PO) Take by mouth.    [provider]    Allergies as of 12/15/2016  . (No Known Allergies)    Family History  Problem Relation Age of Onset  . Diabetes Neg Hx     Social History   Social History  . Marital status: Single    Spouse name: N/A  . Number of children: N/A  . Years of education: N/A   Occupational History  . Not on file.   Social History Main Topics  . Smoking status: Current Every Day Smoker    Packs/day: 1.00    Types: Cigarettes  . Smokeless tobacco: Never Used  . Alcohol use No  . Drug use: No  . Sexual activity: Not on file   Other Topics Concern  . Not on file   Social History Narrative  . No narrative on file    Review of Systems: See HPI, otherwise negative ROS  Physical Exam: BP (!) 181/96   Pulse 72   Temp (!) 96.5 F (35.8 C) (Tympanic)   Resp 18   Ht 5\' 4"  (1.626 m)   Wt 94.8 kg (209 lb)   SpO2 100%   BMI 35.87 kg/m  General:   Alert,  pleasant and cooperative in NAD Head:  Normocephalic and atraumatic. Neck:  Supple; no masses or thyromegaly. Lungs:  Clear throughout to auscultation.    Heart:  Regular rate and rhythm. Abdomen:  Soft, nontender and nondistended. Normal bowel sounds, without guarding, and without rebound.   Neurologic:  Alert and  oriented x4;  grossly normal neurologically.  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  Questions have been answered.  All parties agreeable.   Gaylyn Cheers, MD  02/16/2017, 1:37 PM

## 2017-02-16 NOTE — Op Note (Signed)
[facility] Gastroenterology Patient Name: Lynn Potter Procedure Date: 02/16/2017 1:37 PM MRN: [id number] Account #: [id number] Date of Birth: 07/02/1946 Admit Type: Outpatient Age: 71 Room: [facility] ENDO ROOM 3 Gender: Female Note Status: Finalized Procedure:            Upper GI endoscopy Indications:          Previous duodenal carcinoid for follow up after removal. Providers:            Manya Silvas, MD Referring MD:         No Local Md, MD (Referring MD) Medicines:            Propofol per Anesthesia Complications:        No immediate complications. Procedure:            Pre-Anesthesia Assessment:                       - After reviewing the risks and benefits, the patient                        was deemed in satisfactory condition to undergo the                        procedure.                       After obtaining informed consent, the endoscope was                        passed under direct vision. Throughout the procedure,                        the patient's blood pressure, pulse, and oxygen                        saturations were monitored continuously. The Endoscope                        was introduced through the mouth, and advanced to the                        second part of duodenum. The upper GI endoscopy was                        accomplished without difficulty. The patient tolerated                        the procedure well. Findings:      The examined esophagus was normal.      A small hiatal hernia was present.      The stomach was normal.      The examined duodenum was normal. No carcinoid tumor seen. Impression:           - Normal esophagus.                       - Small hiatal hernia.                       - Normal stomach.                       - Normal  examined duodenum.                       - No specimens collected. Recommendation:       - The findings and recommendations were discussed with                        the patient's  family. Manya Silvas, MD 02/16/2017 1:53:09 PM This report has been signed electronically. Number of Addenda: 0 Note Initiated On: 02/16/2017 1:37 PM      [facility]

## 2017-02-17 ENCOUNTER — Encounter: Payer: Self-pay | Admitting: Unknown Physician Specialty

## 2017-03-10 ENCOUNTER — Ambulatory Visit (INDEPENDENT_AMBULATORY_CARE_PROVIDER_SITE_OTHER): Payer: Medicare Other

## 2017-03-10 ENCOUNTER — Ambulatory Visit (INDEPENDENT_AMBULATORY_CARE_PROVIDER_SITE_OTHER): Payer: Medicare Other | Admitting: Podiatry

## 2017-03-10 ENCOUNTER — Encounter: Payer: Self-pay | Admitting: Podiatry

## 2017-03-10 DIAGNOSIS — M7752 Other enthesopathy of left foot: Secondary | ICD-10-CM | POA: Diagnosis not present

## 2017-03-10 DIAGNOSIS — M79672 Pain in left foot: Secondary | ICD-10-CM

## 2017-03-10 DIAGNOSIS — M79671 Pain in right foot: Secondary | ICD-10-CM

## 2017-03-10 DIAGNOSIS — B351 Tinea unguium: Secondary | ICD-10-CM

## 2017-03-10 DIAGNOSIS — M79676 Pain in unspecified toe(s): Secondary | ICD-10-CM | POA: Diagnosis not present

## 2017-03-11 NOTE — Progress Notes (Signed)
   HPI: Patient is a 71 year old female presenting today with a complaint of pain to bilateral posterior heels and plantar forefeet, left worse than right, that has been ongoing for the past 8-9 years. She also reports a skin lesion to the left forefoot feels like a callus. She states the pain is worse in the morning. She is here for further evaluation and treatment.    Physical Exam: General: The patient is alert and oriented x3 in no acute distress.  Dermatology: Skin is warm, dry and supple bilateral lower extremities. Negative for open lesions or macerations.  Vascular: Palpable pedal pulses bilaterally. No edema or erythema noted. Capillary refill within normal limits.  Neurological: Epicritic and protective threshold grossly intact bilaterally.   Musculoskeletal Exam: Pain on palpation noted to the posterior tubercle of the left calcaneus at the insertion of the Achilles tendon consistent with retrocalcaneal bursitis. Range of motion within normal limits. Muscle strength 5/5 in all muscle groups bilateral lower extremities.  Radiographic Exam:  Posterior calcaneal spur noted to the respective calcaneus on lateral view. No fracture or dislocation noted. Normal osseous mineralization noted.     Assessment: 1. Insertional Achilles tendinitis left 2. Retrocalcaneal bursitis 3. Routine nail care   Plan of Care:  1. Patient was evaluated. Radiographs were reviewed today. 2. Injection of 0.5 mL Celestone Soluspan injected into the retrocalcaneal bursa. Care was taken to avoid direct injection into the Achilles tendon. 3. Mechanical debridement of nails 1-5 bilaterally performed using a nail nipper. Filed with dremel without incident. 4. Ankle brace dispensed. 5. Silicone toe caps dispensed for general toe pain. 6. Return to clinic in 4 weeks.   Edrick Kins, DPM [facility]  Dr. Edrick Kins, [address]                                          [place], [place] [address]                Office [phone]  Fax [phone]

## 2017-03-16 ENCOUNTER — Ambulatory Visit: Payer: Medicare Other | Attending: Nurse Practitioner | Admitting: Nurse Practitioner

## 2017-03-16 ENCOUNTER — Ambulatory Visit
Admission: RE | Admit: 2017-03-16 | Discharge: 2017-03-16 | Disposition: A | Discharge status: Home / Self Care | Payer: Medicare Other | Source: Ambulatory Visit | Attending: Nurse Practitioner | Admitting: Nurse Practitioner

## 2017-03-16 ENCOUNTER — Other Ambulatory Visit: Payer: Self-pay | Admitting: Nurse Practitioner

## 2017-03-16 ENCOUNTER — Encounter: Payer: Self-pay | Admitting: Nurse Practitioner

## 2017-03-16 VITALS — BP 176/64 | HR 81 | Temp 98.3°F | Resp 16 | Ht 64.0 in | Wt 209.0 lb

## 2017-03-16 DIAGNOSIS — F1190 Opioid use, unspecified, uncomplicated: Secondary | ICD-10-CM | POA: Diagnosis not present

## 2017-03-16 DIAGNOSIS — M169 Osteoarthritis of hip, unspecified: Secondary | ICD-10-CM | POA: Diagnosis present

## 2017-03-16 DIAGNOSIS — Z87891 Personal history of nicotine dependence: Secondary | ICD-10-CM | POA: Diagnosis not present

## 2017-03-16 DIAGNOSIS — M533 Sacrococcygeal disorders, not elsewhere classified: Secondary | ICD-10-CM | POA: Diagnosis not present

## 2017-03-16 DIAGNOSIS — M545 Low back pain: Secondary | ICD-10-CM | POA: Diagnosis not present

## 2017-03-16 DIAGNOSIS — G8929 Other chronic pain: Secondary | ICD-10-CM

## 2017-03-16 DIAGNOSIS — M19011 Primary osteoarthritis, right shoulder: Secondary | ICD-10-CM | POA: Insufficient documentation

## 2017-03-16 DIAGNOSIS — G894 Chronic pain syndrome: Secondary | ICD-10-CM | POA: Insufficient documentation

## 2017-03-16 DIAGNOSIS — I10 Essential (primary) hypertension: Secondary | ICD-10-CM | POA: Insufficient documentation

## 2017-03-16 DIAGNOSIS — Z9049 Acquired absence of other specified parts of digestive tract: Secondary | ICD-10-CM | POA: Insufficient documentation

## 2017-03-16 DIAGNOSIS — Z9889 Other specified postprocedural states: Secondary | ICD-10-CM | POA: Diagnosis not present

## 2017-03-16 DIAGNOSIS — M1610 Unilateral primary osteoarthritis, unspecified hip: Secondary | ICD-10-CM | POA: Diagnosis not present

## 2017-03-16 DIAGNOSIS — Z79891 Long term (current) use of opiate analgesic: Secondary | ICD-10-CM | POA: Insufficient documentation

## 2017-03-16 DIAGNOSIS — Z90710 Acquired absence of both cervix and uterus: Secondary | ICD-10-CM | POA: Insufficient documentation

## 2017-03-16 DIAGNOSIS — Z79899 Other long term (current) drug therapy: Secondary | ICD-10-CM | POA: Diagnosis not present

## 2017-03-16 DIAGNOSIS — J45909 Unspecified asthma, uncomplicated: Secondary | ICD-10-CM | POA: Insufficient documentation

## 2017-03-16 DIAGNOSIS — M160 Bilateral primary osteoarthritis of hip: Secondary | ICD-10-CM | POA: Diagnosis not present

## 2017-03-16 DIAGNOSIS — Z7982 Long term (current) use of aspirin: Secondary | ICD-10-CM | POA: Insufficient documentation

## 2017-03-16 HISTORY — DX: Opioid use, unspecified, uncomplicated: F11.90

## 2017-03-16 NOTE — Patient Instructions (Addendum)
°____________________________________________________________________________________________ ° °Appointment Policy ° °It is our goal and responsibility to provide the medical community with assistance in the evaluation and management of patients with chronic pain. Unfortunately our resources are limited. Because we do not have an unlimited amount of time, or available appointments, we are required to closely monitor and manage their use. The following rules exist to maximize their use: ° °Patient's responsibilities: °1. Punctuality: You are required to be physically present and registered in our facility at least 30 minutes before your appointment. °2. Tardiness: The cutoff is your appointment time. If you have an appointment scheduled for 10:00 AM and you arrive at 10:01, you will be required to reschedule your appointment.  °3. Plan ahead: Always assume that you will encounter traffic on your way in. Plan for it. If you are dependent on a driver, make sure they understand these rules and the need to arrive early. °4. Other appointments and responsibilities: Avoid scheduling any other appointments before or after your pain clinic appointments.  °5. Be prepared: Write down everything that you need to discuss with your healthcare provider and give this information to the admitting nurse. Write down the medications that you will need refilled. Bring your pills and bottles (even the empty ones), to all of your appointments, except for those where a procedure is scheduled. °6. No children or pets: Find someone to take care of them. It is not appropriate to bring them in. °7. Scheduling changes: We request "advanced notification" of any changes or cancellations. °8. Advanced notification: Defined as a time period of more than 24 hours prior to the originally scheduled appointment. This allows for the appointment to be offered to other patients. °9. Rescheduling: When a visit is rescheduled, it will require the  cancellation of the original appointment. For this reason they both fall within the category of "Cancellations".  °10. Cancellations: They require advanced notification. Any cancellation less than 24 hours before the  appointment will be recorded as a "No Show". °11. No Show: Defined as an unkept appointment where the patient failed to notify or declare to the practice their intention or inability to keep the appointment. ° °Corrective process for repeat offenders:  °1. Tardiness: Three (3) episodes of rescheduling due to late arrivals will be recorded as one (1) "No Show". °2. Cancellation or reschedule: Three (3) cancellations or rescheduling will be recorded as one (1) "No Show". °3. "No Shows": Three (3) "No Shows" within a 12 month period will result in discharge from the practice. ° °____________________________________________________________________________________________ ° °____________________________________________________________________________________________ ° °Pain Scale ° °Introduction: The pain score used by this practice is the Verbal Numerical Rating Scale (VNRS-11). This is an 11-point scale. It is for adults and children 10 years or older. There are significant differences in how the pain score is reported, used, and applied. Forget everything you learned in the past and learn this scoring system. ° °General Information: The scale should reflect your current level of pain. Unless you are specifically asked for the level of your worst pain, or your average pain. If you are asked for one of these two, then it should be understood that it is over the past 24 hours. ° °Basic Activities of Daily Living (ADL): Personal hygiene, dressing, eating, transferring, and using restroom. ° °Instructions: Most patients tend to report their level of pain as a combination of two factors, their physical pain and their psychosocial pain. This last one is also known as “suffering” and it is reflection of how  physical pain   affects you socially and psychologically. From now on, report them separately. From this point on, when asked to report your pain level, report only your physical pain. Use the following table for reference. ° °Pain Clinic Pain Levels (0-5/10)  °Pain Level Score  Description  °No Pain 0   °Mild pain 1 Nagging, annoying, but does not interfere with basic activities of daily living (ADL). Patients are able to eat, bathe, get dressed, toileting (being able to get on and off the toilet and perform personal hygiene functions), transfer (move in and out of bed or a chair without assistance), and maintain continence (able to control bladder and bowel functions). Blood pressure and heart rate are unaffected. A normal heart rate for a healthy adult ranges from 60 to 100 bpm (beats per minute). °  °Mild to moderate pain 2 Noticeable and distracting. Impossible to hide from other people. More frequent flare-ups. Still possible to adapt and function close to normal. It can be very annoying and may have occasional stronger flare-ups. With discipline, patients may get used to it and adapt. °  °Moderate pain 3 Interferes significantly with activities of daily living (ADL). It becomes difficult to feed, bathe, get dressed, get on and off the toilet or to perform personal hygiene functions. Difficult to get in and out of bed or a chair without assistance. Very distracting. With effort, it can be ignored when deeply involved in activities. °  °Moderately severe pain 4 Impossible to ignore for more than a few minutes. With effort, patients may still be able to manage work or participate in some social activities. Very difficult to concentrate. °Signs of autonomic nervous system discharge are evident: dilated pupils (mydriasis); mild sweating (diaphoresis); sleep interference. Heart rate becomes elevated (>115 bpm). Diastolic blood pressure (lower number) rises above 100 mmHg. Patients find relief in laying down and not  moving. °  °Severe pain 5 Intense and extremely unpleasant. Associated with frowning face and frequent crying. Pain overwhelms the senses.  Ability to do any activity or maintain social relationships becomes significantly limited. Conversation becomes difficult. Pacing back and forth is common, as getting into a comfortable position is nearly impossible. Pain wakes you up from deep sleep. Physical signs will be obvious: pupillary dilation; increased sweating; goosebumps; brisk reflexes; cold, clammy hands and feet; nausea, vomiting or dry heaves; loss of appetite; significant sleep disturbance with inability to fall asleep or to remain asleep. When persistent, significant weight loss is observed due to the complete loss of appetite and sleep deprivation.  Blood pressure and heart rate becomes significantly elevated. °Caution: If elevated blood pressure triggers a pounding headache associated with blurred vision, then the patient should immediately seek attention at an urgent or emergency care unit, as these may be signs of an impending stroke. °  ° °Emergency Department Pain Levels (6-10/10)  °Emergency Room Pain 6 Severely limiting. Requires emergency care and should not be seen or managed at an outpatient pain management facility. Communication becomes difficult and requires great effort. Assistance to reach the emergency department may be required. Facial flushing and profuse sweating along with potentially dangerous increases in heart rate and blood pressure will be evident. °  °Distressing pain 7 Self-care is very difficult. Assistance is required to transport, or use restroom. Assistance to reach the emergency department will be required. Tasks requiring coordination, such as bathing and getting dressed become very difficult. °  °Disabling pain 8 Self-care is no longer possible. At this level, pain is disabling. The individual is   unable to do even the most “basic” activities such as walking, eating, bathing,  dressing, transferring to a bed, or toileting. Fine motor skills are lost. It is difficult to think clearly. °  °Incapacitating pain 9 Pain becomes incapacitating. Thought processing is no longer possible. Difficult to remember your own name. Control of movement and coordination are lost. °  °The worst pain imaginable 10 At this level, most patients pass out from pain. When this level is reached, collapse of the autonomic nervous system occurs, leading to a sudden drop in blood pressure and heart rate. This in turn results in a temporary and dramatic drop in blood flow to the brain, leading to a loss of consciousness. Fainting is one of the body’s self defense mechanisms. Passing out puts the brain in a calmed state and causes it to shut down for a while, in order to begin the healing process. °  ° °Summary: °1. Refer to this scale when providing us with your pain level. °2. Be accurate and careful when reporting your pain level. This will help with your care. °3. Over-reporting your pain level will lead to loss of credibility. °4. Even a level of 1/10 means that there is pain and will be treated at our facility. °5. High, inaccurate reporting will be documented as “Symptom Exaggeration”, leading to loss of credibility and suspicions of possible secondary gains such as obtaining more narcotics, or wanting to appear disabled, for fraudulent reasons. °6. Only pain levels of 5 or below will be seen at our facility. °7. Pain levels of 6 and above will be sent to the Emergency Department and the appointment cancelled. °____________________________________________________________________________________________ ° ° °____________________________________________________________________________________________ ° °Medication Rules ° °Applies to: All patients receiving prescriptions (written or electronic). ° °Pharmacy of record: Pharmacy where electronic prescriptions will be sent. If written prescriptions are taken to a  different pharmacy, please inform the nursing staff. The pharmacy listed in the electronic medical record should be the one where you would like electronic prescriptions to be sent. ° °Prescription refills: Only during scheduled appointments. Applies to both, written and electronic prescriptions. ° °NOTE: The following applies primarily to controlled substances (Opioid Pain Medications) ° °Patient's responsibilities: °1. Pain Pills: Bring all pain pills to every appointment (except for procedure appointments). °2. Pill Bottles: Bring pills in original pharmacy bottle. Always bring newest bottle. Bring bottle, even if empty. °3. Medication refills: You are responsible for knowing and keeping track of what medications you need refilled. The day before your appointment, write a list of all prescriptions that need to be refilled. Bring that list to your appointment and give it to the admitting nurse. Prescriptions will be written only during appointments. If you forget a medication, it will not be "Called in", "Faxed", or "electronically sent". You will need to get another appointment to get these prescribed. °4. Prescription Accuracy: You are responsible for carefully inspecting your prescriptions before leaving our office. Have the discharge nurse carefully go over each prescription with you, before taking them home. Make sure that your name is accurately spelled, that your address is correct. Check the name and dose of your medication to make sure it is accurate. Check the number of pills, and the written instructions to make sure they are clear and accurate. Make sure that you are given enough medication to last until your next medication refill appointment. °5. Taking Medication: Take medication as prescribed. Never take more pills than instructed. Never take medication more frequently than prescribed. Taking less pills or less   frequently is permitted and encouraged, when it comes to controlled substances (written  prescriptions).  °6. Inform other Doctors: Always inform, all of your healthcare providers, of all the medications you take. °7. Pain Medication from other Providers: You are not allowed to accept any additional pain medication from any other Doctor or Healthcare provider. There are two exceptions to this rule. (see below) In the event that you require additional pain medication, you are responsible for notifying us, as stated below. °8. Medication Agreement: You are responsible for carefully reading and following our Medication Agreement. This must be signed before receiving any prescriptions from our practice. Safely store a copy of your signed Agreement. Violations to the Agreement will result in no further prescriptions. (Additional copies of our Medication Agreement are available upon request.) °9. Laws, Rules, & Regulations: All patients are expected to follow all Federal and State Laws, Statutes, Rules, & Regulations. Ignorance of the Laws does not constitute a valid excuse. ° °Exceptions: There are only two exceptions to the rule of not receiving pain medications from other Healthcare Providers. °1. Exception #1 (Emergencies): In the event of an emergency (i.e.: accident requiring emergency care), you are allowed to receive additional pain medication. However, you are responsible for: As soon as you are able, call our office [phone], at any time of the day or night, and leave a message stating your name, the date and nature of the emergency, and the name and dose of the medication prescribed. In the event that your call is answered by a member of our staff, make sure to document and save the date, time, and the name of the person that took your information.  °2. Exception #2 (Planned Surgery): In the event that you are scheduled by another doctor or dentist to have any type of surgery or procedure, you are allowed (for a period no longer than 30 days), to receive additional pain medication, for the  acute post-op pain. However, in this case, you are responsible for picking up a copy of our "Post-op Pain Management for Surgeons" handout, and giving it to your surgeon or dentist. This document is available at our office, and does not require an appointment to obtain it. Simply go to our office during business hours (Monday-Thursday from 8:00 AM to 4:00 PM) (Friday 8:00 AM to 12:00 Noon) or if you have a scheduled appointment with us, prior to your surgery, and ask for it by name. In addition, you will need to provide us with your name, name of your surgeon, type of surgery, and date of procedure or surgery. ° °____________________________________________________________________________________________ °____________________________________________________________________________________________ ° °DRUG HOLIDAYS ° °Definitions °Tolerance: defined as the progressively decreased responsiveness to a drug. Occurs when the drug is used repeatedly and the body adapts to the continued presence of the drug. As a result, a larger dose of the drug is needed to achieve the effect originally obtained by a smaller dose. It is thought to be due to the formation of excess opioid receptors. ° °Drug Holiday: is when a patient stops taking a medication(s) for a period of time; anywhere from a few days to several weeks. ° °Withdrawals: refers to the wide range of symptoms that occur after stopping or dramatically reducing opiate drugs after heavy and prolonged use. Withdrawal symptoms do not occur to patients that use low dose opioids, or those who take the medication sporadically. Contrary to benzodiazepine (example: Valium, Xanax, etc.) or alcohol withdrawals (“Delirium Tremens”), opioid withdrawals are not lethal. Withdrawals are the physical   manifestation of the body getting rid of the excess receptors.  Purpose To eliminate tolerance.  Duration of Holiday 14 consecutive days. (2 weeks)  Expected Symptoms Early symptoms  of withdrawal include:  Agitation  Anxiety  Muscle aches  Increased tearing  Insomnia  Runny nose  Sweating  Yawning  Late symptoms of withdrawal include:  Abdominal cramping  Diarrhea  Dilated pupils  Goose bumps  Nausea  Vomiting  Opioid withdrawal reactions are very uncomfortable but are not life-threatening. Symptoms usually start within 12 hours of last opioid dose and within 30 hours of last methadone exposure.  Duration of Symptoms 48 to 72 hours for short acting medications and 2 to 14 days for methadone.  Treatment  Clonidine (Catapres) or tizanidine (Zanaflex) for agitation, sweating, tearing, runny nose.  Promethazine (Phenergan) for nausea, vomiting.  NSAIDs for pain.  Benefits  Improved effectiveness of opioids.  Decreased opioid dose needed to achieve benefits.  Improved pain with lesser dose.  Please get your x-rays done as soon as possible. ____________________________________________________________________________________________

## 2017-03-16 NOTE — Progress Notes (Signed)
Safety precautions to be maintained throughout the outpatient stay will include: orient to surroundings, keep bed in low position, maintain call bell within reach at all times, provide assistance with transfer out of bed and ambulation.  

## 2017-03-16 NOTE — Progress Notes (Signed)
Patient's Name: Lynn Potter  MRN: [id number]  Referring Provider: Remi Haggard, FNP  DOB: 09-Jun-1946  PCP: Remi Haggard, FNP  DOS: 03/16/2017  Note by: Dionisio David NP  Service setting: Ambulatory outpatient  Specialty: Interventional Pain Management  Location: [facility] (AMB) Pain Management Facility    Patient type: New Patient    Primary Reason(s) for Visit: Initial Patient Evaluation CC: Back Pain (lower)  HPI  Lynn Potter is a 71 y.o. year old, female patient, who comes today for an initial evaluation. She has Personal history of tobacco use, presenting hazards to health; Sepsis ([place]); Osteoarthritis of shoulder region; Primary osteoarthritis of right shoulder; Rotator cuff tendinitis; S/P rotator cuff repair; Chronic pain syndrome; Encounter for long-term opiate analgesic use; Long term current use of opiate analgesic; Opiate use; Degenerative joint disease (DJD) of hip; Sacroiliac joint pain; and Low back pain (primary )(left greater than right) on her problem list.. Her primarily concern today is the Back Pain (lower)  Pain Assessment: Self-Reported Pain Score: 10-Worst pain ever/10 Clinically the patient looks like a 2/10 Reported level is inconsistent with clinical observations. Information on the proper use of the pain scale provided to the patient today Pain Location: Back Pain Orientation: Left, Lower Pain Descriptors / Indicators: Sharp, Throbbing, Nagging Pain Frequency: Constant  Onset and Duration: Date of onset: 25 years ago Cause of pain: Work related accident or event Severity: NAS-11 at its worse: 10/10, NAS-11 at its best: 5/10, NAS-11 now: 8/10 and NAS-11 on the average: 8/10 Timing: Not influenced by the time of the day Aggravating Factors: Bending, Nerve blocks and Walking Alleviating Factors: Medications Associated Problems: Night-time cramps, Spasms, Tingling and Pain that wakes patient up Quality of Pain: Aching, Cramping, Sharp, Throbbing and  Tingling Previous Examinations or Tests: Nerve block Previous Treatments: Epidural steroid injections  The patient comes into the clinics today for the first time for a chronic pain management evaluation. According to the patient her primary area of pain is in her lower back; left side being greater than the right. She does have radicular symptoms that goes down her leg into the top and bottom of her feet. She is status post laminectomy in 1989 and 2002. She has had interventional therapy however is unsure of what was performed it was not effective. She did have physical therapy after surgery states that it was not effective. Her second area of pain is in her hips. She admits that the left is greater than the right. She does have some numbness and tingling along with cramps. She's denies any interventional therapy, physical therapy or recent images. Her next area of pain is in her knees. She is status post bilateral total knee replacements. She admits that the left knee hurts worse than the right. She denies any interventional therapies on her knees. Last physical therapy was completed after surgery which was effective. She also admits that she has right shoulder pain she was told she had spurs on her shoulder. She denies any interventional procedures. She did have physical therapy in the past which was effective. Lastly her pain is in her left foot and ankle. She was recently seen by podiatry. She was diagnosed with ankle weakness and spurs on her left foot. There is no surgical intervention to be completed at this time. She admits that she was a previous patient of Dr. crisp. She was then seen at [facility] pain clinic in [place]. She states that her insurance is no longer participant and so she was  referred here for pain management.  Today I took the time to provide the patient with information regarding this pain practice. The patient was informed that the practice is divided into two sections: an  interventional pain management section, as well as a completely separate and distinct medication management section. I explained that there are procedure days for interventional therapies, and evaluation days for follow-ups and medication management. Because of the amount of documentation required during both, they are kept separated. This means that there is the possibility that she may be scheduled for a procedure on one day, and medication management the next. I have also informed her that because of staffing and facility limitations, this practice will no longer take patients for medication management only. To illustrate the reasons for this, I gave the patient the example of surgeons, and how inappropriate it would be to refer a patient to his/her care, just to write for the post-surgical antibiotics on a surgery done by a different surgeon.   Because interventional pain management is part of the board-certified specialty for the doctors, the patient was informed that joining this practice means that they are open to any and all interventional therapies. I made it clear that this does not mean that they will be forced to have any procedures done. What this means is that I believe interventional therapies to be essential part of the diagnosis and proper management of chronic pain conditions. Therefore, patients not interested in these interventional alternatives will be better served under the care of a different practitioner.  The patient was also made aware of my Comprehensive Pain Management Safety Guidelines where by joining this practice, they limit all of their nerve blocks and joint injections to those done by our practice, for as long as we are retained to manage their care. Historic Controlled Substance Pharmacotherapy Review  PMP and historical list of controlled substances: Oxycodone/acetaminophen 5/325 mg 3 times daily, clonazepam 0.5 mg daily, Lyrica 150 mg 3 times daily, tramadol 50 mg twice  daily, oxycodone 10 mg every 4 hours, alprazolam 0.5 mg twice daily, zolpidem 10 mg daily at bedtime prn #10, Highest opioid analgesic regimen found: Oxycodone 10 mg every 4 hours Most recent opioid analgesic: Oxycodone/acetaminophen 5/325 one to 2 times daily Current opioid analgesics: Oxycodone/acetaminophen 5/325 one to 2 times daily Highest recorded MME/day: 90 mg/day MME/day: 22.5 mg/day Medications: The patient did not bring the medication(s) to the appointment, as requested in our "New Patient Package" Pharmacodynamics: Desired effects: Analgesia: The patient reports >50% benefit. Reported improvement in function: The patient reports medication allows her to accomplish basic ADLs. Clinically meaningful improvement in function (CMIF): Sustained CMIF goals met Perceived effectiveness: Described as relatively effective, allowing for increase in activities of daily living (ADL) Undesirable effects: Side-effects or Adverse reactions: None reported Historical Monitoring: The patient  reports that she does not use drugs. List of all UDS Test(s): No results found for: MDMA, COCAINSCRNUR, PCPSCRNUR, PCPQUANT, CANNABQUANT, THCU, [place] List of all Serum Drug Screening Test(s):  No results found for: AMPHSCRSER, BARBSCRSER, BENZOSCRSER, COCAINSCRSER, PCPSCRSER, PCPQUANT, THCSCRSER, CANNABQUANT, OPIATESCRSER, OXYSCRSER, PROPOXSCRSER Historical Background Evaluation: [place] PDMP: Six (6) year initial data search conducted.             [place] Department of public safety, offender search: Editor, commissioning Information) Non-contributory Risk Assessment Profile: Aberrant behavior: None observed or detected today Risk factors for fatal opioid overdose: Concomitant use of Benzodiazepines, Sleep Apnea, High daily dosage and None identified today Fatal overdose hazard ratio (HR): 1.92 for 50-99  MME/day Non-fatal overdose hazard ratio (HR): 1.44 for 20-49 MME/day Risk of opioid abuse or dependence: 0.7-3.0% with doses ? 36  MME/day and 6.1-26% with doses ? 120 MME/day. Substance use disorder (SUD) risk level: Pending results of Medical Psychology Evaluation for SUD Opioid risk tool (ORT) (Total Score): 3  ORT Scoring interpretation table:  Score <3 = Low Risk for SUD  Score between 4-7 = Moderate Risk for SUD  Score >8 = High Risk for Opioid Abuse   PHQ-2 Depression Scale:  Total score: 0  PHQ-2 Scoring interpretation table: (Score and probability of major depressive disorder)  Score 0 = No depression  Score 1 = 15.4% Probability  Score 2 = 21.1% Probability  Score 3 = 38.4% Probability  Score 4 = 45.5% Probability  Score 5 = 56.4% Probability  Score 6 = 78.6% Probability   PHQ-9 Depression Scale:  Total score: 0  PHQ-9 Scoring interpretation table:  Score 0-4 = No depression  Score 5-9 = Mild depression  Score 10-14 = Moderate depression  Score 15-19 = Moderately severe depression  Score 20-27 = Severe depression (2.4 times higher risk of SUD and 2.89 times higher risk of overuse)   Pharmacologic Plan: Pending ordered tests and/or consults  Meds  The patient has a current medication list which includes the following prescription(s): albuterol-ipratropium, aspirin, benazepril-hydrochlorthiazide, ferrous sulfate, fluticasone, latanoprost, pregabalin, and vitamin d (ergocalciferol).  Current Outpatient Prescriptions on File Prior to Visit  Medication Sig   albuterol-ipratropium (COMBIVENT) 18-103 MCG/ACT inhaler Inhale 1 puff into the lungs every 6 (six) hours as needed for wheezing or shortness of breath.   aspirin 81 MG tablet Take 81 mg by mouth daily.   benazepril-hydrochlorthiazide (LOTENSIN HCT) 20-25 MG tablet Take 1 tablet by mouth daily.   ferrous sulfate 325 (65 FE) MG EC tablet Take 325 mg by mouth 3 (three) times daily with meals.   fluticasone (FLOVENT HFA) 110 MCG/ACT inhaler Inhale into the lungs 2 (two) times daily.   latanoprost (XALATAN) 0.005 % ophthalmic solution 1 drop  at bedtime.   pregabalin (LYRICA) 150 MG capsule Take 150 mg by mouth 2 (two) times daily.   Vitamin D, Ergocalciferol, (DRISDOL) 50000 units CAPS capsule Take 50,000 Units by mouth every 7 (seven) days.   No current facility-administered medications on file prior to visit.    Imaging Review  Cervical Imaging:  Results for orders placed in visit on 05/17/07  MR C Spine Ltd W/O Cm   Narrative * PRIOR REPORT IMPORTED FROM AN EXTERNAL SYSTEM *   PRIOR REPORT IMPORTED FROM THE [facility] [place] EXAM:    Neck pain, right shoulder and arm pain  COMMENTS:   PROCEDURE:     MR  - MR CERVICAL SPINE WO CONT  - May 17 2007  7:55PM   RESULT:     Sagittal and axial T1 and effectively T2 weighted images were  obtained through the cervical spine.   Comparison is made to a study of 01/24/2006.   The cervicovertebral bodies are preserved in height. There is some mild  reversal of the normal cervical lordosis. Very mild ventral impressions  are  made upon the CSF filled thecal sac at C4-5, C5-6 and C6-7 on the sagittal  images. These appear relatively stable. Axial imaging was performed from  the  mid body of C2 to approximately the T1-T2 disc level.   The craniocervical junction is normal in signal intensity and position. At  C1-2, the AP dimension  of the thecal sac is 12.0 mm. No more than mild  encroachment upon the RIGHT neural foramen is seen due to an end-plate  bar.   At C3-4, the AP dimension of the thecal sac measures approximately 12.0  mm.  Mild encroachment upon the RIGHT neural foramen is seen.   At C4-5, there is annular disc bulging. In the midline, the AP dimension  of  the thecal sac is 12.0 mm and the neural foramina are patent.   At C5-6, the AP dimension of the thecal sac is 12.0 mm. There is mild  encroachment upon the RIGHT neural foramen due to end-plate osteophyte.   At C6-7, there is a broadly-based annular disc bulge. The AP dimension of   the thecal sac is 9.0 mm. No more than minimal encroachment upon the  neural  foramina is seen.   At C7-T1, the AP dimension of the thecal sac is 9.0 mm. Mild encroachment  upon the RIGHT neural foramen due to an end-plate bar is noted.   IMPRESSION:  There is moderate degenerative disc disease at multiple disc  levels. The most significantly involved level is C6-7 where the AP  dimension  of the thecal sac measures between 8.0 and 9.0 mm. There is no discrete  focal HNP and no high-grade neural foraminal encroachment at any level.  The  appearance of the findings at C6-7 is slightly less prominent than they  were  in 2007 and a discrete HNP cannot be clearly identified though there is  certainly annular disc bulging, asymmetric toward the RIGHT.   Thank you for this opportunity to contribute to the care of your patient.      Shoulder Imaging:  Results for orders placed in visit on 09/15/10  MR Shoulder Right Wo Contrast   Narrative * PRIOR REPORT IMPORTED FROM AN EXTERNAL SYSTEM *   PRIOR REPORT IMPORTED FROM THE [facility] WORKFLOW SYSTEM   REASON FOR EXAM:    bilateral shoulder pain and weakness hx of rotator  cuff  surgery  COMMENTS:   PROCEDURE:     MR  - MR SHOULDER RT  WO CONTRAST  - Sep 15 2010  5:01PM   RESULT:     Multiplanar and multisequence MR imaging of the right shoulder  is compared to the previous examination dated 05/17/2007. There is a  history  of previous right shoulder surgery for rotator cuff abnormality.   There is hypertrophic degenerative change in the right acromioclavicular  joint with some spurring and edema. There is evidence of signal void in  the  area of the myotendinous junction of the right supraspinatus. There is  abnormal increased signal within the anterior/inferior aspect of the  glenoid  of the scapula which has worsened since the previous study. There is  evidence of joint space narrowing. There is thickening of the long head   biceps tendon without definite signal abnormality. There is patient motion  artifact on numerous sequences which, along with the artifact from  previous  surgery, decrease the sensitivity of the images. The subscapularis tendon  appears intact. The infraspinatus tendon shows some evidence of tendinosis  without a full-thickness tear or retraction. The proximal humerus appears  intact. There is degenerative spurring along the inferior margin of the  humeral head.   IMPRESSION:  1. Degradation of the image quality by patient motion and artifact from  previous surgery.  2. Degenerative changes with narrowing of the glenohumeral joint and some  subchondral edematous signal which may  be secondary to degenerative change  in the glenoid itself.  3. Right acromioclavicular degenerative changes with edema and  hypertrophy.  4. Degenerative narrowing. Thickening of the long head of biceps tendon.  Small amount of fluid in the joint at the area of subchondral edematous  change. Orthopedic followup is recommended.   Thank you for the opportunity to contribute to the care of your patient.        Results for orders placed in visit on 09/15/10  MR Shoulder Left Wo Contrast   Narrative * PRIOR REPORT IMPORTED FROM AN EXTERNAL SYSTEM *   PRIOR REPORT IMPORTED FROM THE [facility] WORKFLOW SYSTEM   REASON FOR EXAM:    bilateral shoulder pain and weakness hx of rotator  cuff  surgery  COMMENTS:   PROCEDURE:     MR  - MR SHOULDER LT  WO CONTRAST  - Sep 15 2010  4:19PM   RESULT:   HISTORY:   Pain. Prior rotator cuff surgery.   COMPARISON STUDY:   No recent.   PROCEDURE AND FINDINGS:  Multiplanar, multisequence imaging of the left  shoulder was obtained. Severe acromioclavicular and glenohumeral  degenerative change present. The patient has had prior rotator cuff  surgery  with prominent metallic artifact in the distribution of the rotator cuff  tendons. There is no subacromial or subdeltoid  fluid to suggest a complete  tear. Partial tears of the supraspinatus and infraspinatus tendons cannot  be  excluded. Partial tear of the subscapularis tendon cannot be excluded.  There  is thickening of the subscapularis tendon. Teres minor tendon is intact.  There may be mild fraying of the long head of the biceps tendon. Glenoid  labrum is grossly intact with degenerative change and subchondral bony  degenerative change.   IMPRESSION:   1.     The patient has had prior rotator cuff tear with prominent amount  of  metallic artifact. No complete tear noted. Severe degenerative change  present.  2.     Partial tears of the supraspinatus, infraspinatus and subscapular  tendons cannot be excluded.   Thank you for the opportunity to contribute to the care of your patient.        Results for orders placed in visit on 03/23/11  DG Shoulder Right   Narrative * PRIOR REPORT IMPORTED FROM AN EXTERNAL SYSTEM *   PRIOR REPORT IMPORTED FROM THE [facility] WORKFLOW SYSTEM   REASON FOR EXAM:    painful from fall  COMMENTS:   PROCEDURE:     DXR - DXR SHOULDER RIGHT COMPLETE  - Mar 23 2011  2:00AM   RESULT:     Comparison: None.   Findings:  There is mild degenerative change of the glenohumeral joint. No acute  fracture or dislocation seen. Areas of ossification adjacent to the  acromion  likely sequela of old prior trauma.   IMPRESSION:  No acute fracture or dislocation.        Lumbosacral Imaging:  Results for orders placed in visit on 06/18/07  MR Lumbar Spine W Wo Contrast   Narrative * PRIOR REPORT IMPORTED FROM AN EXTERNAL SYSTEM *   PRIOR REPORT IMPORTED FROM THE [facility] WORKFLOW SYSTEM   REASON FOR EXAM:    Low Back Pain and Extremity Pain  Pt had surgery in  2002  COMMENTS:   PROCEDURE:     MR  - MR LUMBAR SPINE WO/W  - Jun 18 2007  2:47PM   RESULT:     Multiplanar/multisequence imaging of  the lumbar spine is  obtained pre-and-post intravenous administration of 20  ml IV Magnevist.   The conus medullaris terminates at an L1-L2 level. The visualized cauda  equina demonstrate no signal or enhancement abnormalities. Areas of bloom  artifact are appreciated at the L4 and L5 levels posteriorly secondary to  metallic prostheses.   At the T12-L1 level, L1-L2 level, L2-L3 level, there is no evidence of  thecal sac stenosis or exiting nerve root compression or compromise.   At the L3-L4 level, a broad-based disc bulge is appreciated which causes  partial effacement of the anterior CSF space. Postsurgical changes are  demonstrated within the posterior elements of L4. There is evidence of  postsurgical scarring. Epidural scarring is identified along the posterior  LEFT of the thecal sac without evidence of significant resulting thecal  sac  stenosis. The exiting nerve roots are difficult to evaluate and are poorly  appreciated secondary to metallic streak artifact.   At the L4-L5 disc space level, postsurgical changes are identified within  the posterior elements of L5. There does not appear to be evidence of  significant thecal sac stenosis nor exiting nerve root compression or  compromise. No evidence of abnormal enhancement is appreciated.   At the L5-S1 level, there does not appear to be evidence of thecal sac  stenosis or exiting nerve root compression or compromise. This level is  also  degraded by bloom artifact from the prosthetic metal devices.   IMPRESSION:   1.     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 Exiting  nerve root compression or compromise cannot be excluded though evaluation  is  markedly limited secondary to metallic bloom artifact. Note; there does  appear to be evidence of bilateral neural foraminal narrowing and exiting  nerve root compromise as well a compression bilaterally secondary to facet  and ligamentum flavum hypertrophy is a  diagnostic consideration.   Thank you for the opportunity to contribute to the care of your patient.        Results for orders placed in visit on 03/23/11  DG Knee Complete 4 Views Right   Narrative * PRIOR REPORT IMPORTED FROM AN EXTERNAL SYSTEM *   PRIOR REPORT IMPORTED FROM THE SY[facility]GO WORKFLOW SYSTEM   REASON FOR EXAM:    painful from fall  COMMENTS:   PROCEDURE:     DXR - DXR KNEE RT COMP WITH OBLIQUES  - Mar 23 2011  1:55AM   RESULT:     Comparison: None   Findings:  Hardware seen from right knee arthroplasty. No perihardware lucency. No  fracture.   IMPRESSION:  No acute fracture.       Note: Available results from prior imaging studies were reviewed.        ROS  Cardiovascular History: Daily Aspirin intake Pulmonary or Respiratory History: Smoker Neurological History: Negative for epilepsy, stroke, urinary or fecal inontinence, spina bifida or tethered cord syndrome Review of Past Neurological Studies:  Results for orders placed or performed during the hospital encounter of 06/24/15  CT Head Wo Contrast   Narrative   CLINICAL DATA:  Acute onset severe headache  EXAM: CT HEAD WITHOUT CONTRAST  TECHNIQUE: Contiguous axial images were obtained from the base of the skull through the vertex without intravenous contrast.  COMPARISON:  03/23/2011  FINDINGS: No skull fracture is noted. Paranasal sinuses and mastoid air cells are unremarkable. No intracranial hemorrhage, mass effect or midline  shift. No acute cortical infarction. No mass lesion is noted on this unenhanced scan. The gray and white-matter differentiation is preserved. Mild cerebral atrophy.  IMPRESSION: No acute intracranial abnormality.  No significant change.   Electronically Signed   By: Lahoma Crocker M.D.   On: 06/24/2015 14:06   Results for orders placed or performed in visit on 09/19/08  MR Brain W Wo Contrast   Narrative   * PRIOR REPORT IMPORTED FROM AN EXTERNAL SYSTEM    PRIOR  REPORT IMPORTED FROM THE [facility] WORKFLOW SYSTEM   REASON FOR EXAM:    HA  COMMENTS:   PROCEDURE:     MR  - MR BRAIN WO/W CONTRAST  - Sep 19 2008  3:20PM   RESULT:   HISTORY: Headache.   COMPARISON STUDIES: None.   PROCEDURE AND FINDINGS: Multiplanar/multisequence imaging of the Brain is  obtained.  No mass lesion is noted. There is no hydrocephalus. Eighth  nerve  complexes are normal. Subcortical and deep white matter changes are noted  consistent with chronic ischemia. No evidence of acute ischemia noted.   IMPRESSION:   1. No evidence of acute ischemia.   2. Deep white matter changes most consistent with chronic ischemia.   Thank you for the opportunity to contribute to the care of your patient.       Psychological-Psychiatric History: Negative for anxiety, depression, schizophrenia, bipolar disorders or suicidal ideations or attempts Gastrointestinal History: Negative for peptic ulcer disease, hiatal hernia, GERD, IBS, hepatitis, cirrhosis or pancreatitis Genitourinary History: Negative for nephrolithiasis, hematuria, renal failure or chronic kidney disease Hematological History: Negative for anticoagulant therapy, anemia, bruising or bleeding easily, hemophilia, sickle cell disease or trait, thrombocytopenia or coagulupathies Endocrine History: Negative for diabetes or thyroid disease Rheumatologic History: Rheumatoid arthritis Musculoskeletal History: Negative for myasthenia gravis, muscular dystrophy, multiple sclerosis or malignant hyperthermia Work History: Disabled  Allergies  Ms. Rybolt has No Known Allergies.  Laboratory Chemistry  Inflammation Markers Lab Results  Component Value Date   CRP 20.4 (H) 03/16/2017   ESRSEDRATE 16 03/16/2017   (CRP: Acute Phase) (ESR: Chronic Phase) Renal Function Markers Lab Results  Component Value Date   BUN 7 (L) 03/16/2017   CREATININE 0.80 03/16/2017   GFRAA 85 03/16/2017   GFRNONAA 74 03/16/2017   Hepatic  Function Markers Lab Results  Component Value Date   AST 10 03/16/2017   ALT 12 03/16/2017   ALBUMIN 3.6 03/16/2017   ALKPHOS 81 03/16/2017   Electrolytes Lab Results  Component Value Date   NA 141 03/16/2017   K 4.4 03/16/2017   CL 103 03/16/2017   CALCIUM 9.0 03/16/2017   MG 2.1 03/16/2017   Neuropathy Markers Lab Results  Component Value Date   VITAMINB12 182 (L) 03/16/2017   Bone Pathology Markers Lab Results  Component Value Date   ALKPHOS 81 03/16/2017   25OHVITD1 WILL FOLLOW 03/16/2017   25OHVITD2 WILL FOLLOW 03/16/2017   25OHVITD3 WILL FOLLOW 03/16/2017   CALCIUM 9.0 03/16/2017   Coagulation Parameters Lab Results  Component Value Date   INR 1.00 09/30/2016   LABPROT 13.2 09/30/2016   PLT 196 10/01/2016   Cardiovascular Markers Lab Results  Component Value Date   HGB 11.6 (L) 10/01/2016   HCT 35.7 10/01/2016   Note: Lab results reviewed.  [place]  Drug: Ms. Wigen  reports that she does not use drugs. Alcohol:  reports that she does not drink alcohol. Tobacco:  reports that she has been smoking Cigarettes.  She has been smoking about  1.00 pack per day. She has never used smokeless tobacco. Medical:  has a past medical history of AF (atrial fibrillation) ([place]); Arthritis; Asthma; Chicken pox; Glaucoma; Hemorrhoids; History of hiatal hernia; Hypertension; Migraines; Obesity; Opiate use (03/16/2017); Personal history of tobacco use, presenting hazards to health (06/18/2015); and Sleep apnea. Family: family history is not on file.  Past Surgical History:  Procedure Laterality Date   ABDOMINAL HYSTERECTOMY     BACK SURGERY     carpal tunnell     CATARACT EXTRACTION     CHOLECYSTECTOMY     COLONOSCOPY WITH ESOPHAGOGASTRODUODENOSCOPY (EGD)     ESOPHAGOGASTRODUODENOSCOPY (EGD) WITH PROPOFOL N/A 02/16/2017   Procedure: ESOPHAGOGASTRODUODENOSCOPY (EGD) WITH PROPOFOL;  Surgeon: Manya Silvas, MD;  Location: [facility] ENDOSCOPY;  Service: Endoscopy;   Laterality: N/A;   EYE SURGERY     JOINT REPLACEMENT     LUMBAR FUSION     TOTAL KNEE ARTHROPLASTY     UPPER ESOPHAGEAL ENDOSCOPIC ULTRASOUND (EUS) N/A 01/03/2016   Procedure: UPPER ESOPHAGEAL ENDOSCOPIC ULTRASOUND (EUS);  Surgeon: Holly Bodily, MD;  Location: [facility] ENDOSCOPY;  Service: Gastroenterology;  Laterality: N/A;   Active Ambulatory Problems    Diagnosis Date Noted   Personal history of tobacco use, presenting hazards to health 06/18/2015   Sepsis ([place]) 09/30/2016   Osteoarthritis of shoulder region 03/10/2014   Primary osteoarthritis of right shoulder 10/17/2015   Rotator cuff tendinitis 03/10/2014   S/P rotator cuff repair 03/10/2014   Chronic pain syndrome 03/16/2017   Encounter for long-term opiate analgesic use 03/16/2017   Long term current use of opiate analgesic 03/16/2017   Opiate use 03/16/2017   Degenerative joint disease (DJD) of hip 03/16/2017   Sacroiliac joint pain 03/16/2017   Low back pain (primary )(left greater than right) 03/17/2017   Resolved Ambulatory Problems    Diagnosis Date Noted   No Resolved Ambulatory Problems   Past Medical History:  Diagnosis Date   AF (atrial fibrillation) ([facility])    Arthritis    Asthma    Chicken pox    Glaucoma    Hemorrhoids    History of hiatal hernia    Hypertension    Migraines    Obesity    Opiate use 03/16/2017   Personal history of tobacco use, presenting hazards to health 06/18/2015   Sleep apnea    Constitutional Exam  General appearance: Well nourished, well developed, and well hydrated. In no apparent acute distress Vitals:   03/16/17 1410  BP: (!) 176/64  Pulse: 81  Resp: 16  Temp: 98.3 F (36.8 C)  TempSrc: Oral  SpO2: 98%  Weight: 209 lb (94.8 kg)  Height: 5' 4"  (1.626 m)   BMI Assessment: Estimated body mass index is 35.87 kg/m as calculated from the following:   Height as of this encounter: 5' 4"  (1.626 m).   Weight as of this encounter: 209 lb  (94.8 kg).  BMI interpretation table: BMI level Category Range association with higher incidence of chronic pain  <18 kg/m2 Underweight   18.5-24.9 kg/m2 Ideal body weight   25-29.9 kg/m2 Overweight Increased incidence by 20%  30-34.9 kg/m2 Obese (Class I) Increased incidence by 68%  35-39.9 kg/m2 Severe obesity (Class II) Increased incidence by 136%  >40 kg/m2 Extreme obesity (Class III) Increased incidence by 254%   BMI Readings from Last 4 Encounters:  03/16/17 35.87 kg/m  02/16/17 35.87 kg/m  11/20/16 33.45 kg/m  09/30/16 43.08 kg/m   Wt Readings from Last 4 Encounters:  03/16/17 209 lb (94.8  kg)  02/16/17 209 lb (94.8 kg)  11/20/16 201 lb (91.2 kg)  09/30/16 251 lb (113.9 kg)  Psych/Mental status: Alert, oriented x 3 (person, place, & time)       Eyes: PERLA Respiratory: No evidence of acute respiratory distress  Cervical Spine Exam  Inspection: No masses, redness, or swelling Alignment: Symmetrical Functional ROM: Unrestricted ROM      Stability: No instability detected Muscle strength & Tone: Functionally intact Sensory: Unimpaired Palpation: No palpable anomalies              Upper Extremity (UE) Exam    Side: Right upper extremity  Side: Left upper extremity  Inspection: No masses, redness, swelling, or asymmetry. No contractures  Inspection: No masses, redness, swelling, or asymmetry. No contractures  Functional ROM: Unrestricted ROM          Functional ROM: Unrestricted ROM          Muscle strength & Tone: Functionally intact  Muscle strength & Tone: Functionally intact  Sensory: Unimpaired  Sensory: Unimpaired  Palpation: No palpable anomalies              Palpation: No palpable anomalies              Specialized Test(s): Deferred         Specialized Test(s): Deferred          Thoracic Spine Exam  Inspection: No masses, redness, or swelling Alignment: Symmetrical Functional ROM: Unrestricted ROM Stability: No instability detected Sensory:  Unimpaired Muscle strength & Tone: No palpable anomalies  Lumbar Spine Exam  Inspection: No masses, redness, or swelling Alignment: Symmetrical Functional ROM: Unrestricted ROM      Stability: No instability detected Muscle strength & Tone: Functionally intact Sensory: Unimpaired Palpation: Complains of area being tender to palpation       Provocative Tests: Lumbar Hyperextension and rotation test: Unable to perform       Patrick's Maneuver: Positive for bilateral S-I arthralgia and for bilateral hip arthralgia  Gait & Posture Assessment  Ambulation: Patient ambulates using a cane Gait: Limited. Using assistive device to ambulate Posture: WNL   Lower Extremity Exam    Side: Right lower extremity  Side: Left lower extremity  Inspection: No masses, redness, swelling, or asymmetry. No contractures  Inspection: No masses, redness, swelling, or asymmetry. No contractures  Functional ROM: Unrestricted ROM          Functional ROM: Unrestricted ROM          Muscle strength & Tone: Unble to Toe-walk & Heel-walk   Muscle strength & Tone: UInable to Toe-walk & Heel-walk   Sensory: Unimpaired  Sensory: Unimpaired  Palpation: Uncomfortable  Palpation: Uncomfortable   Assessment  Primary Diagnosis & Pertinent Problem List: The primary encounter diagnosis was Chronic left-sided low back pain, with sciatica presence unspecified. Diagnoses of Sacroiliac joint pain, Osteoarthritis of hip, unspecified laterality, unspecified osteoarthritis type, Chronic pain syndrome, Encounter for long-term opiate analgesic use, Long term current use of opiate analgesic, and Opiate use were also pertinent to this visit.  Visit Diagnosis: 1. Chronic left-sided low back pain, with sciatica presence unspecified   2. Sacroiliac joint pain   3. Osteoarthritis of hip, unspecified laterality, unspecified osteoarthritis type   4. Chronic pain syndrome   5. Encounter for long-term opiate analgesic use   6. Long term  current use of opiate analgesic   7. Opiate use    Plan of Care  Initial treatment plan:  Please be advised that as per  protocol, today's visit has been an evaluation only. We have not taken over the patient's controlled substance management.  Problem-specific plan: No problem-specific Assessment & Plan notes found for this encounter.  Ordered Lab-work, Procedure(s), Referral(s), & Consult(s): Orders Placed This Encounter  Procedures   DG HIP UNILAT W OR W/O PELVIS 2-3 VIEWS LEFT   DG HIP UNILAT W OR W/O PELVIS 2-3 VIEWS RIGHT   DG Si Joints   Compliance Drug Analysis, Ur   Comprehensive metabolic panel   C-reactive protein   Magnesium   Sedimentation rate   Vitamin B12   25-Hydroxyvitamin D Lcms D2+D3   Ambulatory referral to Psychology   Pharmacotherapy: Medications ordered:  No orders of the defined types were placed in this encounter.  Medications administered during this visit: Ms. Swickard had no medications administered during this visit.   Pharmacotherapy under consideration:  Opioid Analgesics: The patient was informed that there is no guarantee that she would be a candidate for opioid analgesics. The decision will be made following CDC guidelines. This decision will be based on the results of diagnostic studies, as well as Ms. Maroney risk profile.  Membrane stabilizer: To be determined at a later time Muscle relaxant: To be determined at a later time NSAID: To be determined at a later time Other analgesic(s): To be determined at a later time   Interventional therapies under consideration: Ms. Vanderslice was informed that there is no guarantee that she would be a candidate for interventional therapies. The decision will be based on the results of diagnostic studies, as well as Ms. Mahaffy risk profile.  Possible procedure(s): Lumbar epidural steroid injection Lumbar facet nerve block Lumbar radiofrequency ablation Diagnostic bilateral steroid hip  injection Sacroiliac facet block Sacroiliac radiofrequency ablation Bilateral genicular nerve block Bilateral knee radiofrequency ablation   Provider-requested follow-up: Return for 2nd Visit, after [facility] eval, w/ Dr. Andree Elk.  Future Appointments Date Time Provider [place]  04/07/2017 1:15 PM Edrick Kins, DPM [id number]    Primary Care Physician: Remi Haggard, FNP Location: [facility] Outpatient Pain Management Facility Note by:  Date: 03/16/2017; Time: 9:44 AM  Pain Score Disclaimer: We use the NRS-11 scale. This is a self-reported, subjective measurement of pain severity with only modest accuracy. It is used primarily to identify changes within a particular patient. It must be understood that outpatient pain scales are significantly less accurate that those used for research, where they can be applied under ideal controlled circumstances with minimal exposure to variables. In reality, the score is likely to be a combination of pain intensity and pain affect, where pain affect describes the degree of emotional arousal or changes in action readiness caused by the sensory experience of pain. Factors such as social and work situation, setting, emotional state, anxiety levels, expectation, and prior pain experience may influence pain perception and show large inter-individual differences that may also be affected by time variables.  Patient instructions provided during this appointment: Patient Instructions    ____________________________________________________________________________________________  Appointment Policy  It is our goal and responsibility to provide the medical community with assistance in the evaluation and management of patients with chronic pain. Unfortunately our resources are limited. Because we do not have an unlimited amount of time, or available appointments, we are required to closely monitor and manage their use. The following rules exist to  maximize their use:  Patient's responsibilities: 1. Punctuality: You are required to be physically present and registered in our facility at least 30 minutes before your appointment. 2.  Tardiness: The cutoff is your appointment time. If you have an appointment scheduled for 10:00 AM and you arrive at 10:01, you will be required to reschedule your appointment.  3. Plan ahead: Always assume that you will encounter traffic on your way in. Plan for it. If you are dependent on a driver, make sure they understand these rules and the need to arrive early. 4. Other appointments and responsibilities: Avoid scheduling any other appointments before or after your pain clinic appointments.  5. Be prepared: Write down everything that you need to discuss with your healthcare provider and give this information to the admitting nurse. Write down the medications that you will need refilled. Bring your pills and bottles (even the empty ones), to all of your appointments, except for those where a procedure is scheduled. 6. No children or pets: Find someone to take care of them. It is not appropriate to bring them in. 7. Scheduling changes: We request "advanced notification" of any changes or cancellations. 8. Advanced notification: Defined as a time period of more than 24 hours prior to the originally scheduled appointment. This allows for the appointment to be offered to other patients. 9. Rescheduling: When a visit is rescheduled, it will require the cancellation of the original appointment. For this reason they both fall within the category of "Cancellations".  10. Cancellations: They require advanced notification. Any cancellation less than 24 hours before the  appointment will be recorded as a "No Show". 11. No Show: Defined as an unkept appointment where the patient failed to notify or declare to the practice their intention or inability to keep the appointment.  Corrective process for repeat offenders:   1. Tardiness: Three (3) episodes of rescheduling due to late arrivals will be recorded as one (1) "No Show". 2. Cancellation or reschedule: Three (3) cancellations or rescheduling will be recorded as one (1) "No Show". 3. "No Shows": Three (3) "No Shows" within a 12 month period will result in discharge from the practice.  ____________________________________________________________________________________________  ____________________________________________________________________________________________  Pain Scale  Introduction: The pain score used by this practice is the Verbal Numerical Rating Scale (VNRS-11). This is an 11-point scale. It is for adults and children 10 years or older. There are significant differences in how the pain score is reported, used, and applied. Forget everything you learned in the past and learn this scoring system.  General Information: The scale should reflect your current level of pain. Unless you are specifically asked for the level of your worst pain, or your average pain. If you are asked for one of these two, then it should be understood that it is over the past 24 hours.  Basic Activities of Daily Living (ADL): Personal hygiene, dressing, eating, transferring, and using restroom.  Instructions: Most patients tend to report their level of pain as a combination of two factors, their physical pain and their psychosocial pain. This last one is also known as suffering and it is reflection of how physical pain affects you socially and psychologically. From now on, report them separately. From this point on, when asked to report your pain level, report only your physical pain. Use the following table for reference.  Pain Clinic Pain Levels (0-5/10)  Pain Level Score  Description  No Pain 0   Mild pain 1 Nagging, annoying, but does not interfere with basic activities of daily living (ADL). Patients are able to eat, bathe, get dressed, toileting (being able to  get on and off the toilet and perform personal hygiene  functions), transfer (move in and out of bed or a chair without assistance), and maintain continence (able to control bladder and bowel functions). Blood pressure and heart rate are unaffected. A normal heart rate for a healthy adult ranges from 60 to 100 bpm (beats per minute).   Mild to moderate pain 2 Noticeable and distracting. Impossible to hide from other people. More frequent flare-ups. Still possible to adapt and function close to normal. It can be very annoying and may have occasional stronger flare-ups. With discipline, patients may get used to it and adapt.   Moderate pain 3 Interferes significantly with activities of daily living (ADL). It becomes difficult to feed, bathe, get dressed, get on and off the toilet or to perform personal hygiene functions. Difficult to get in and out of bed or a chair without assistance. Very distracting. With effort, it can be ignored when deeply involved in activities.   Moderately severe pain 4 Impossible to ignore for more than a few minutes. With effort, patients may still be able to manage work or participate in some social activities. Very difficult to concentrate. Signs of autonomic nervous system discharge are evident: dilated pupils (mydriasis); mild sweating (diaphoresis); sleep interference. Heart rate becomes elevated (>115 bpm). Diastolic blood pressure (lower number) rises above 100 mmHg. Patients find relief in laying down and not moving.   Severe pain 5 Intense and extremely unpleasant. Associated with frowning face and frequent crying. Pain overwhelms the senses.  Ability to do any activity or maintain social relationships becomes significantly limited. Conversation becomes difficult. Pacing back and forth is common, as getting into a comfortable position is nearly impossible. Pain wakes you up from deep sleep. Physical signs will be obvious: pupillary dilation; increased sweating; goosebumps;  brisk reflexes; cold, clammy hands and feet; nausea, vomiting or dry heaves; loss of appetite; significant sleep disturbance with inability to fall asleep or to remain asleep. When persistent, significant weight loss is observed due to the complete loss of appetite and sleep deprivation.  Blood pressure and heart rate becomes significantly elevated. Caution: If elevated blood pressure triggers a pounding headache associated with blurred vision, then the patient should immediately seek attention at an urgent or emergency care unit, as these may be signs of an impending stroke.    Emergency Department Pain Levels (6-10/10)  Emergency Room Pain 6 Severely limiting. Requires emergency care and should not be seen or managed at an outpatient pain management facility. Communication becomes difficult and requires great effort. Assistance to reach the emergency department may be required. Facial flushing and profuse sweating along with potentially dangerous increases in heart rate and blood pressure will be evident.   Distressing pain 7 Self-care is very difficult. Assistance is required to transport, or use restroom. Assistance to reach the emergency department will be required. Tasks requiring coordination, such as bathing and getting dressed become very difficult.   Disabling pain 8 Self-care is no longer possible. At this level, pain is disabling. The individual is unable to do even the most basic activities such as walking, eating, bathing, dressing, transferring to a bed, or toileting. Fine motor skills are lost. It is difficult to think clearly.   Incapacitating pain 9 Pain becomes incapacitating. Thought processing is no longer possible. Difficult to remember your own name. Control of movement and coordination are lost.   The worst pain imaginable 10 At this level, most patients pass out from pain. When this level is reached, collapse of the autonomic nervous system occurs, leading to a  sudden drop in  blood pressure and heart rate. This in turn results in a temporary and dramatic drop in blood flow to the brain, leading to a loss of consciousness. Fainting is one of the bodys self defense mechanisms. Passing out puts the brain in a calmed state and causes it to shut down for a while, in order to begin the healing process.    Summary: 1. Refer to this scale when providing [place] with your pain level. 2. Be accurate and careful when reporting your pain level. This will help with your care. 3. Over-reporting your pain level will lead to loss of credibility. 4. Even a level of 1/10 means that there is pain and will be treated at our facility. 5. High, inaccurate reporting will be documented as Symptom Exaggeration, leading to loss of credibility and suspicions of possible secondary gains such as obtaining more narcotics, or wanting to appear disabled, for fraudulent reasons. 6. Only pain levels of 5 or below will be seen at our facility. 7. Pain levels of 6 and above will be sent to the Emergency Department and the appointment cancelled. ____________________________________________________________________________________________   ____________________________________________________________________________________________  Medication Rules  Applies to: All patients receiving prescriptions (written or electronic).  Pharmacy of record: Pharmacy where electronic prescriptions will be sent. If written prescriptions are taken to a different pharmacy, please inform the nursing staff. The pharmacy listed in the electronic medical record should be the one where you would like electronic prescriptions to be sent.  Prescription refills: Only during scheduled appointments. Applies to both, written and electronic prescriptions.  NOTE: The following applies primarily to controlled substances (Opioid Pain Medications)  Patient's responsibilities: 1. Pain Pills: Bring all pain pills to every appointment  (except for procedure appointments). 2. Pill Bottles: Bring pills in original pharmacy bottle. Always bring newest bottle. Bring bottle, even if empty. 3. Medication refills: You are responsible for knowing and keeping track of what medications you need refilled. The day before your appointment, write a list of all prescriptions that need to be refilled. Bring that list to your appointment and give it to the admitting nurse. Prescriptions will be written only during appointments. If you forget a medication, it will not be "Called in", "Faxed", or "electronically sent". You will need to get another appointment to get these prescribed. 4. Prescription Accuracy: You are responsible for carefully inspecting your prescriptions before leaving our office. Have the discharge nurse carefully go over each prescription with you, before taking them home. Make sure that your name is accurately spelled, that your address is correct. Check the name and dose of your medication to make sure it is accurate. Check the number of pills, and the written instructions to make sure they are clear and accurate. Make sure that you are given enough medication to last until your next medication refill appointment. 5. Taking Medication: Take medication as prescribed. Never take more pills than instructed. Never take medication more frequently than prescribed. Taking less pills or less frequently is permitted and encouraged, when it comes to controlled substances (written prescriptions).  6. Inform other Doctors: Always inform, all of your healthcare providers, of all the medications you take. 7. Pain Medication from other Providers: You are not allowed to accept any additional pain medication from any other Doctor or Healthcare provider. There are two exceptions to this rule. (see below) In the event that you require additional pain medication, you are responsible for notifying us, as stated below. 8. Medication Agreement: You are  responsible for  carefully reading and following our Medication Agreement. This must be signed before receiving any prescriptions from our practice. Safely store a copy of your signed Agreement. Violations to the Agreement will result in no further prescriptions. (Additional copies of our Medication Agreement are available upon request.) 9. Laws, Rules, & Regulations: All patients are expected to follow all Federal and [employer], Rules, [employer]. Ignorance of the Laws does not constitute a valid excuse.  Exceptions: There are only two exceptions to the rule of not receiving pain medications from other Healthcare Providers. 1. Exception #1 (Emergencies): In the event of an emergency (i.e.: accident requiring emergency care), you are allowed to receive additional pain medication. However, you are responsible for: As soon as you are able, call our office [phone], at any time of the day or night, and leave a message stating your name, the date and nature of the emergency, and the name and dose of the medication prescribed. In the event that your call is answered by a member of our staff, make sure to document and save the date, time, and the name of the person that took your information.  2. Exception #2 (Planned Surgery): In the event that you are scheduled by another doctor or dentist to have any type of surgery or procedure, you are allowed (for a period no longer than 30 days), to receive additional pain medication, for the acute post-op pain. However, in this case, you are responsible for picking up a copy of our "Post-op Pain Management for Surgeons" handout, and giving it to your surgeon or dentist. This document is available at our office, and does not require an appointment to obtain it. Simply go to our office during business hours (Monday-Thursday from 8:00 AM to 4:00 PM) (Friday 8:00 AM to 12:00 Noon) or if you have a scheduled appointment with [place], prior to your surgery, and ask  for it by name. In addition, you will need to provide [place] with your name, name of your surgeon, type of surgery, and date of procedure or surgery.  ____________________________________________________________________________________________ ____________________________________________________________________________________________  DRUG HOLIDAYS  Definitions Tolerance: defined as the progressively decreased responsiveness to a drug. Occurs when the drug is used repeatedly and the body adapts to the continued presence of the drug. As a result, a larger dose of the drug is needed to achieve the effect originally obtained by a smaller dose. It is thought to be due to the formation of excess opioid receptors.  Drug Holiday: is when a patient stops taking a medication(s) for a period of time; anywhere from a few days to several weeks.  Withdrawals: refers to the wide range of symptoms that occur after stopping or dramatically reducing opiate drugs after heavy and prolonged use. Withdrawal symptoms do not occur to patients that use low dose opioids, or those who take the medication sporadically. Contrary to benzodiazepine (example: Valium, Xanax, etc.) or alcohol withdrawals (Delirium Tremens), opioid withdrawals are not lethal. Withdrawals are the physical manifestation of the body getting rid of the excess receptors.  Purpose To eliminate tolerance.  Duration of Holiday 14 consecutive days. (2 weeks)  Expected Symptoms Early symptoms of withdrawal include:  Agitation  Anxiety  Muscle aches  Increased tearing  Insomnia  Runny nose  Sweating  Yawning  Late symptoms of withdrawal include:  Abdominal cramping  Diarrhea  Dilated pupils  Goose bumps  Nausea  Vomiting  Opioid withdrawal reactions are very uncomfortable but are not life-threatening. Symptoms usually start within 12 hours  of last opioid dose and within 30 hours of last methadone exposure.  Duration of  Symptoms 48 to 72 hours for short acting medications and 2 to 14 days for methadone.  Treatment  Clonidine (Catapres) or tizanidine (Zanaflex) for agitation, sweating, tearing, runny nose.  Promethazine (Phenergan) for nausea, vomiting.  NSAIDs for pain.  Benefits  Improved effectiveness of opioids.  Decreased opioid dose needed to achieve benefits.  Improved pain with lesser dose.  Please get your x-rays done as soon as possible. ____________________________________________________________________________________________

## 2017-03-17 NOTE — Progress Notes (Signed)
Results were reviewed and found to be: abnormal, but not significant  Initial impression would suggest no surgically correctable pathology  Review would suggest interventional pain management techniques may be of benefit

## 2017-03-17 NOTE — Progress Notes (Signed)
Results were reviewed and found to be: mildly abnormal  No acute injury or pathology identified  Review would suggest interventional pain management techniques may be of benefit 

## 2017-03-19 LAB — MAGNESIUM: MAGNESIUM: 2.1 mg/dL (ref 1.6–2.3)

## 2017-03-19 LAB — COMPREHENSIVE METABOLIC PANEL
ALT: 12 IU/L (ref 0–32)
AST: 10 IU/L (ref 0–40)
Albumin/Globulin Ratio: 1.2 (ref 1.2–2.2)
Albumin: 3.6 g/dL (ref 3.5–4.8)
Alkaline Phosphatase: 81 IU/L (ref 39–117)
BUN/Creatinine Ratio: 9 — ABNORMAL LOW (ref 12–28)
BUN: 7 mg/dL — ABNORMAL LOW (ref 8–27)
Bilirubin Total: 0.3 mg/dL (ref 0.0–1.2)
CALCIUM: 9 mg/dL (ref 8.7–10.3)
CO2: 27 mmol/L (ref 20–29)
CREATININE: 0.8 mg/dL (ref 0.57–1.00)
Chloride: 103 mmol/L (ref 96–106)
GFR calc Af Amer: 85 mL/min/{1.73_m2} (ref >59)
GFR, EST NON AFRICAN AMERICAN: 74 mL/min/{1.73_m2} (ref >59)
GLOBULIN, TOTAL: 3.1 g/dL (ref 1.5–4.5)
Glucose: 115 mg/dL — ABNORMAL HIGH (ref 65–99)
Potassium: 4.4 mmol/L (ref 3.5–5.2)
SODIUM: 141 mmol/L (ref 134–144)
Total Protein: 6.7 g/dL (ref 6.0–8.5)

## 2017-03-19 LAB — 25-HYDROXYVITAMIN D LCMS D2+D3: 25-HYDROXY, VITAMIN D-3: 21 ng/mL

## 2017-03-19 LAB — VITAMIN B12: VITAMIN B 12: 182 pg/mL — AB (ref 232–1245)

## 2017-03-19 LAB — 25-HYDROXY VITAMIN D LCMS D2+D3: 25-Hydroxy, Vitamin D: 21 ng/mL — ABNORMAL LOW

## 2017-03-19 LAB — C-REACTIVE PROTEIN: CRP: 20.4 mg/L — ABNORMAL HIGH (ref 0.0–4.9)

## 2017-03-19 LAB — SEDIMENTATION RATE: Sed Rate: 16 mm/hr (ref 0–40)

## 2017-03-20 LAB — COMPLIANCE DRUG ANALYSIS, UR

## 2017-03-25 ENCOUNTER — Encounter: Payer: Self-pay | Admitting: Nurse Practitioner

## 2017-03-25 DIAGNOSIS — E559 Vitamin D deficiency, unspecified: Secondary | ICD-10-CM | POA: Insufficient documentation

## 2017-03-28 MED ORDER — BETAMETHASONE SOD PHOS & ACET 6 (3-3) MG/ML IJ SUSP: 3.0000 mg | Freq: Once | INTRAMUSCULAR | Status: DC

## 2017-04-07 ENCOUNTER — Ambulatory Visit (INDEPENDENT_AMBULATORY_CARE_PROVIDER_SITE_OTHER): Payer: Medicare Other | Admitting: Podiatry

## 2017-04-07 ENCOUNTER — Encounter: Payer: Self-pay | Admitting: Podiatry

## 2017-04-07 DIAGNOSIS — M7752 Other enthesopathy of left foot: Secondary | ICD-10-CM | POA: Diagnosis not present

## 2017-04-10 NOTE — Progress Notes (Signed)
   HPI: Patient is a 71 year old female presenting today for follow up evaluation of LLE pain. She states she is doing better but is still experiencing some cramping in the left leg and foot. The injection and wearing the brace help alleviate the pain. She denies any new complaints at this time.    Physical Exam: General: The patient is alert and oriented x3 in no acute distress.  Dermatology: Skin is warm, dry and supple bilateral lower extremities. Negative for open lesions or macerations.  Vascular: Palpable pedal pulses bilaterally. No edema or erythema noted. Capillary refill within normal limits.  Neurological: Epicritic and protective threshold grossly intact bilaterally.   Musculoskeletal Exam: Pain on palpation noted to the posterior tubercle of the left calcaneus at the insertion of the Achilles tendon consistent with retrocalcaneal bursitis. Range of motion within normal limits. Muscle strength 5/5 in all muscle groups bilateral lower extremities.      Assessment: 1. Insertional Achilles tendinitis left 2. Retrocalcaneal bursitis 3. Routine nail care   Plan of Care:  1. Patient was evaluated.  2. Injection of 0.5 mL Celestone Soluspan injected into the retrocalcaneal bursa. Care was taken to avoid direct injection into the Achilles tendon. 3. Continue wearing ankle brace with good supportive shoe gear. 4. Return to clinic in 8 weeks.   Edrick Kins, DPM [facility]  Dr. Edrick Kins, [address]                                        [place], [place] [address]                Office [phone]  Fax [phone]

## 2017-04-11 MED ORDER — BETAMETHASONE SOD PHOS & ACET 6 (3-3) MG/ML IJ SUSP: 3.0000 mg | Freq: Once | INTRAMUSCULAR | Status: DC

## 2017-06-09 ENCOUNTER — Emergency Department
Admission: EM | Admit: 2017-06-09 | Discharge: 2017-06-09 | Disposition: A | Discharge status: Home / Self Care | Payer: Medicare Other | Attending: Emergency Medicine | Admitting: Emergency Medicine

## 2017-06-09 ENCOUNTER — Emergency Department: Payer: Medicare Other

## 2017-06-09 DIAGNOSIS — Z79899 Other long term (current) drug therapy: Secondary | ICD-10-CM | POA: Insufficient documentation

## 2017-06-09 DIAGNOSIS — I10 Essential (primary) hypertension: Secondary | ICD-10-CM | POA: Diagnosis not present

## 2017-06-09 DIAGNOSIS — Z7982 Long term (current) use of aspirin: Secondary | ICD-10-CM | POA: Diagnosis not present

## 2017-06-09 DIAGNOSIS — J45909 Unspecified asthma, uncomplicated: Secondary | ICD-10-CM | POA: Insufficient documentation

## 2017-06-09 DIAGNOSIS — R519 Headache, unspecified: Secondary | ICD-10-CM

## 2017-06-09 DIAGNOSIS — R51 Headache: Secondary | ICD-10-CM | POA: Insufficient documentation

## 2017-06-09 DIAGNOSIS — F17210 Nicotine dependence, cigarettes, uncomplicated: Secondary | ICD-10-CM | POA: Diagnosis not present

## 2017-06-09 LAB — DIFFERENTIAL
BASOS PCT: 1 %
Basophils Absolute: 0.2 10*3/uL — ABNORMAL HIGH (ref 0–0.1)
Eosinophils Absolute: 0.1 10*3/uL (ref 0–0.7)
Eosinophils Relative: 1 %
LYMPHS ABS: 4.1 10*3/uL — AB (ref 1.0–3.6)
LYMPHS PCT: 28 %
Monocytes Absolute: 0.7 10*3/uL (ref 0.2–0.9)
Monocytes Relative: 5 %
Neutro Abs: 9.5 10*3/uL — ABNORMAL HIGH (ref 1.4–6.5)
Neutrophils Relative %: 65 %

## 2017-06-09 LAB — CBC
HEMATOCRIT: 35.9 % (ref 35.0–47.0)
HEMOGLOBIN: 12 g/dL (ref 12.0–16.0)
MCH: 26.1 pg (ref 26.0–34.0)
MCHC: 33.3 g/dL (ref 32.0–36.0)
MCV: 78.6 fL — AB (ref 80.0–100.0)
Platelets: 316 10*3/uL (ref 150–440)
RBC: 4.57 MIL/uL (ref 3.80–5.20)
RDW: 15.9 % — ABNORMAL HIGH (ref 11.5–14.5)
WBC: 14.6 10*3/uL — ABNORMAL HIGH (ref 3.6–11.0)

## 2017-06-09 LAB — PROTIME-INR
INR: 0.92
PROTHROMBIN TIME: 12.3 s (ref 11.4–15.2)

## 2017-06-09 LAB — COMPREHENSIVE METABOLIC PANEL
ALT: 17 U/L (ref 14–54)
AST: 25 U/L (ref 15–41)
Albumin: 3.7 g/dL (ref 3.5–5.0)
Alkaline Phosphatase: 77 U/L (ref 38–126)
Anion gap: 10 (ref 5–15)
BUN: 17 mg/dL (ref 6–20)
CHLORIDE: 100 mmol/L — AB (ref 101–111)
CO2: 26 mmol/L (ref 22–32)
CREATININE: 0.69 mg/dL (ref 0.44–1.00)
Calcium: 9.2 mg/dL (ref 8.9–10.3)
GFR calc Af Amer: >60 mL/min (ref >60)
Glucose, Bld: 212 mg/dL — ABNORMAL HIGH (ref 65–99)
Potassium: 3.2 mmol/L — ABNORMAL LOW (ref 3.5–5.1)
Sodium: 136 mmol/L (ref 135–145)
Total Bilirubin: 0.6 mg/dL (ref 0.3–1.2)
Total Protein: 7.8 g/dL (ref 6.5–8.1)

## 2017-06-09 LAB — APTT: aPTT: 24 seconds (ref 24–36)

## 2017-06-09 LAB — TROPONIN I

## 2017-06-09 LAB — SEDIMENTATION RATE: Sed Rate: 31 mm/hr — ABNORMAL HIGH (ref 0–30)

## 2017-06-09 MED ORDER — SODIUM CHLORIDE 0.9 % IV BOLUS (SEPSIS)
500.0000 mL | Freq: Once | INTRAVENOUS | Status: AC
  Administered 2017-06-09: 500 mL via INTRAVENOUS

## 2017-06-09 MED ORDER — DIPHENHYDRAMINE HCL 50 MG/ML IJ SOLN
25.0000 mg | Freq: Once | INTRAMUSCULAR | Status: AC
  Administered 2017-06-09: 25 mg via INTRAVENOUS
  Filled 2017-06-09: qty 1

## 2017-06-09 MED ORDER — PROCHLORPERAZINE EDISYLATE 5 MG/ML IJ SOLN
10.0000 mg | Freq: Once | INTRAMUSCULAR | Status: AC
  Administered 2017-06-09: 10 mg via INTRAVENOUS
  Filled 2017-06-09: qty 2

## 2017-06-09 MED ORDER — KETOROLAC TROMETHAMINE 30 MG/ML IJ SOLN
30.0000 mg | Freq: Once | INTRAMUSCULAR | Status: AC
  Administered 2017-06-09: 30 mg via INTRAVENOUS
  Filled 2017-06-09: qty 1

## 2017-06-09 NOTE — ED Notes (Signed)
MD at bedside. 

## 2017-06-09 NOTE — ED Notes (Signed)
Pt reports numbness in face is no longer present. PT verbalized desire to go home, "to be in my own bed." MD made aware of request.

## 2017-06-09 NOTE — ED Triage Notes (Signed)
Pt c/o head and neck pain since yesterday, intermittent left sided facial numbness for the past 3 weeks. No facial droop noted on arrival or other neuro deficit.

## 2017-06-09 NOTE — ED Provider Notes (Signed)
[facility] Emergency Department Provider Note  ____________________________________________   First MD Initiated Contact with Patient 06/09/17 2005     (approximate)  I have reviewed the triage vital signs and the nursing notes.   HISTORY  Chief Complaint Headache and Numbness   HPI Lynn Potter is a 71 y.o. female with a history of migraine headache as well as glaucoma who is presenting to the emergency department with a left-sided headache. She states the headache started earlier today and was mild and has progressed to a 10 out of 10, sharp and throbbing pain. She also states that she has had numbness to the left side of the face over the past several weeks. She is denying any eye pain or vision changes. Denies photophobia. Says that her pain is associated with nausea. Says that she has had migraines in the past that it felt slightly different and were relieved with Botox injections several years ago. She does not report any weakness to the bilateral upper or lower extremities that is new. However, she does have a baseline left lower summary weakness that she says is from "slipped disc."   Past Medical History:  Diagnosis Date  . AF (atrial fibrillation) ([place])   . Arthritis   . Asthma   . Chicken pox   . Glaucoma   . Hemorrhoids   . History of hiatal hernia   . Hypertension   . Migraines   . Obesity   . Opiate use 03/16/2017  . Personal history of tobacco use, presenting hazards to health 06/18/2015  . Sleep apnea     Patient Active Problem List   Diagnosis Date Noted  . Vitamin D insufficiency 03/25/2017  . Low back pain (primary )(left greater than right) 03/17/2017  . Chronic pain syndrome 03/16/2017  . Encounter for long-term opiate analgesic use 03/16/2017  . Long term current use of opiate analgesic 03/16/2017  . Opiate use 03/16/2017  . 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 Sacroiliac joint pain 03/16/2017  . Sepsis  ([place]) 09/30/2016  . Primary osteoarthritis of right shoulder 10/17/2015  . Personal history of tobacco use, presenting hazards to health 06/18/2015  . Osteoarthritis of shoulder region 03/10/2014  . Rotator cuff tendinitis 03/10/2014  . S/P rotator cuff repair 03/10/2014    Past Surgical History:  Procedure Laterality Date  . ABDOMINAL HYSTERECTOMY    . BACK SURGERY    . carpal tunnell    . CATARACT EXTRACTION    . CHOLECYSTECTOMY    . COLONOSCOPY WITH ESOPHAGOGASTRODUODENOSCOPY (EGD)    . ESOPHAGOGASTRODUODENOSCOPY (EGD) WITH PROPOFOL N/A 02/16/2017   Procedure: ESOPHAGOGASTRODUODENOSCOPY (EGD) WITH PROPOFOL;  Surgeon: Manya Silvas, MD;  Location: [facility] ENDOSCOPY;  Service: Endoscopy;  Laterality: N/A;  . EYE SURGERY    . JOINT REPLACEMENT    . LUMBAR FUSION    . TOTAL KNEE ARTHROPLASTY    . UPPER ESOPHAGEAL ENDOSCOPIC ULTRASOUND (EUS) N/A 01/03/2016   Procedure: UPPER ESOPHAGEAL ENDOSCOPIC ULTRASOUND (EUS);  Surgeon: Holly Bodily, MD;  Location: [facility] ENDOSCOPY;  Service: Gastroenterology;  Laterality: N/A;    Prior to Admission medications   Medication Sig Start Date End Date Taking? Authorizing Provider  albuterol-ipratropium (COMBIVENT) 18-103 MCG/ACT inhaler Inhale 1 puff into the lungs every 6 (six) hours as needed for wheezing or shortness of breath.    [provider]  aspirin 81 MG tablet Take 81 mg by mouth daily.    [provider]  benazepril-hydrochlorthiazide (LOTENSIN HCT) 20-25  MG tablet Take 1 tablet by mouth daily.    [provider]  ferrous sulfate 325 (65 FE) MG EC tablet Take 325 mg by mouth 3 (three) times daily with meals.    [provider]  fluticasone (FLOVENT HFA) 110 MCG/ACT inhaler Inhale into the lungs 2 (two) times daily.    [provider]  latanoprost (XALATAN) 0.005 % ophthalmic solution 1 drop at bedtime.    [provider]  pregabalin (LYRICA) 150 MG capsule Take 150 mg by mouth 2  (two) times daily.    [provider]  Vitamin D, Ergocalciferol, (DRISDOL) 50000 units CAPS capsule Take 50,000 Units by mouth every 7 (seven) days.    [provider]    Allergies Patient has no known allergies.  Family History  Problem Relation Age of Onset  . Diabetes Neg Hx     Social History Social History  Substance Use Topics  . Smoking status: Current Every Day Smoker    Packs/day: 1.00    Types: Cigarettes  . Smokeless tobacco: Never Used  . Alcohol use No    Review of Systems  Constitutional: No fever/chills Eyes: No visual changes. ENT: No sore throat. Cardiovascular: Denies chest pain. Respiratory: Denies shortness of breath. Gastrointestinal: No abdominal pain.   no vomiting.  No diarrhea.  No constipation. Genitourinary: Negative for dysuria. Musculoskeletal: Negative for back pain. Skin: Negative for rash. Neurological:  Baseline left-sided lid lag that she says is been ongoing for several years.   ____________________________________________   PHYSICAL EXAM:  VITAL SIGNS: ED Triage Vitals  Enc Vitals Group     BP 06/09/17 1838 (!) 148/81     Pulse Rate 06/09/17 1838 90     Resp 06/09/17 1838 18     Temp 06/09/17 1838 98.2 F (36.8 C)     Temp Source 06/09/17 1838 Oral     SpO2 06/09/17 1838 100 %     Weight 06/09/17 1839 209 lb (94.8 kg)     Height 06/09/17 1839 5\' 4"  (1.626 m)     Head Circumference --      Peak Flow --      Pain Score 06/09/17 1838 10     Pain Loc --      Pain Edu? --      Excl. in [place]? --     Constitutional: Alert and oriented. Well appearing and in no acute distress. Eyes: Conjunctivae are normal. PERRLA. No fixed, midrange pupil with a steamy cornea. Extraocular muscles are intact. Mild left-sided lid lag. Head: Atraumatic. No tenderness or nodularity along the distribution of the temporal arteries. Nose: No congestion/rhinnorhea. Mouth/Throat: Mucous membranes are moist.  Neck: No stridor.     Cardiovascular: Normal rate, regular rhythm. Grossly normal heart sounds.   Respiratory: Normal respiratory effort.  No retractions. Lungs CTAB. Gastrointestinal: Soft and nontender. No distention.  Musculoskeletal: No lower extremity tenderness nor edema.  No joint effusions. Neurologic:  Normal speech and language. No gross focal neurologic deficits are appreciated. Skin:  Skin is warm, dry and intact. No rash noted. Psychiatric: Mood and affect are normal. Speech and behavior are normal.  ____________________________________________   LABS (all labs ordered are listed, but only abnormal results are displayed)  Labs Reviewed  CBC - Abnormal; Notable for the following:       Result Value   WBC 14.6 (*)    MCV 78.6 (*)    RDW 15.9 (*)    All other components within normal limits  DIFFERENTIAL - Abnormal; Notable for the following:    Neutro Abs 9.5 (*)    Lymphs Abs 4.1 (*)    Basophils Absolute 0.2 (*)    All other components within normal limits  COMPREHENSIVE METABOLIC PANEL - Abnormal; Notable for the following:    Potassium 3.2 (*)    Chloride 100 (*)    Glucose, Bld 212 (*)    All other components within normal limits  SEDIMENTATION RATE - Abnormal; Notable for the following:    Sed Rate 31 (*)    All other components within normal limits  PROTIME-INR  APTT  TROPONIN I  CBG MONITORING, ED   ____________________________________________  EKG  ED ECG REPORT I, Doran Stabler, the attending physician, personally viewed and interpreted this ECG.   Date: 06/09/2017  EKG Time: 1841  Rate: 87  Rhythm: normal sinus rhythm with PVC 2  Axis: Left axis deviation  Intervals:none  ST&T Change: No ST segment elevation or depression or T wave inversions in V2. No severe change from previous. ____________________________________________  RADIOLOGY  No acute finding on the CT brain ____________________________________________   PROCEDURES  Procedure(s)  performed:   Procedures  Critical Care performed:   ____________________________________________   INITIAL IMPRESSION / ASSESSMENT AND PLAN / ED COURSE  Pertinent labs & imaging results that were available during my care of the patient were reviewed by me and considered in my medical decision making (see chart for details).  ----------------------------------------- 9:59 PM on 06/09/2017 -----------------------------------------  Patient says that her episodes of numbness have not returned. Says that her pain is now mild and she would like to be discharged home. Slightly elevated sedimentation rate but lower than the range that I expect for giant cell arteritis. Also without visual changes that I noticed pressure clock,. Likely migraine headache as the patient has had in the past. We'll give her neurologic follow-up. She understands the plan and willing to comply. She knows to return for any worsening or concerning symptoms.      ____________________________________________   FINAL CLINICAL IMPRESSION(S) / ED DIAGNOSES  Headache    NEW MEDICATIONS STARTED DURING THIS VISIT:  New Prescriptions   No medications on file     Note:  This document was prepared using Dragon voice recognition software and may include unintentional dictation errors.     Orbie Pyo, MD 06/09/17 2200

## 2017-06-12 ENCOUNTER — Ambulatory Visit (INDEPENDENT_AMBULATORY_CARE_PROVIDER_SITE_OTHER): Payer: Medicare Other | Admitting: Podiatry

## 2017-06-12 ENCOUNTER — Encounter: Payer: Self-pay | Admitting: Podiatry

## 2017-06-12 DIAGNOSIS — M7752 Other enthesopathy of left foot: Secondary | ICD-10-CM

## 2017-06-12 DIAGNOSIS — M7662 Achilles tendinitis, left leg: Secondary | ICD-10-CM

## 2017-06-12 DIAGNOSIS — B351 Tinea unguium: Secondary | ICD-10-CM

## 2017-06-12 DIAGNOSIS — M79676 Pain in unspecified toe(s): Secondary | ICD-10-CM | POA: Diagnosis not present

## 2017-06-15 NOTE — Progress Notes (Signed)
   HPI: Patient is a 71 year old female presenting today for follow up evaluation of LLE pain. She states her pain has improved. She also complains of elongated, thickened nails of bilateral feet. She is unable to trim her own nails.    Physical Exam: General: The patient is alert and oriented x3 in no acute distress.  Dermatology: Nails are tender, long, thickened and dystrophic with subungual debris, consistent with onychomycosis, 1-5 bilateral. No signs of infection noted. Skin is warm, dry and supple bilateral lower extremities. Negative for open lesions or macerations.  Vascular: Palpable pedal pulses bilaterally. No edema or erythema noted. Capillary refill within normal limits.  Neurological: Epicritic and protective threshold grossly intact bilaterally.   Musculoskeletal Exam: Pain on palpation noted to the posterior tubercle of the left calcaneus at the insertion of the Achilles tendon consistent with retrocalcaneal bursitis. Range of motion within normal limits. Muscle strength 5/5 in all muscle groups bilateral lower extremities.      Assessment: 1. Insertional Achilles tendinitis left 2. Onychodystrophic nails 1-5 bilateral with hyperkeratosis of nails.  3. Onychomycosis of nail due to dermatophyte bilateral   Plan of Care:  1. Patient was evaluated.  2. Injection of 0.5 mL Celestone Soluspan injected into the retrocalcaneal bursa. Care was taken to avoid direct injection into the Achilles tendon. 3. Continue wearing ankle brace with good supportive shoe gear. 4. Mechanical debridement of nails 1-5 bilaterally performed using a nail nipper. Filed with dremel without incident.  5. Return to clinic in 3 months.   Edrick Kins, DPM [facility]  Dr. Edrick Kins, [address]                                        [place], [place] [address]                Office [phone]  Fax [phone]

## 2017-06-18 MED ORDER — BETAMETHASONE SOD PHOS & ACET 6 (3-3) MG/ML IJ SUSP: 3.0000 mg | Freq: Once | INTRAMUSCULAR | Status: DC

## 2017-07-28 ENCOUNTER — Emergency Department: Payer: Medicare Other

## 2017-07-28 ENCOUNTER — Encounter: Payer: Self-pay | Admitting: Emergency Medicine

## 2017-07-28 ENCOUNTER — Inpatient Hospital Stay
Admission: EM | Admit: 2017-07-28 | Discharge: 2017-07-31 | DRG: 638 | Disposition: A | Discharge status: Skilled Nursing Facility | Payer: Medicare Other | Attending: Internal Medicine | Admitting: Internal Medicine

## 2017-07-28 DIAGNOSIS — Z833 Family history of diabetes mellitus: Secondary | ICD-10-CM | POA: Diagnosis not present

## 2017-07-28 DIAGNOSIS — E875 Hyperkalemia: Secondary | ICD-10-CM | POA: Diagnosis present

## 2017-07-28 DIAGNOSIS — R739 Hyperglycemia, unspecified: Secondary | ICD-10-CM | POA: Diagnosis not present

## 2017-07-28 DIAGNOSIS — I959 Hypotension, unspecified: Secondary | ICD-10-CM | POA: Diagnosis not present

## 2017-07-28 DIAGNOSIS — E871 Hypo-osmolality and hyponatremia: Secondary | ICD-10-CM | POA: Diagnosis not present

## 2017-07-28 DIAGNOSIS — N179 Acute kidney failure, unspecified: Secondary | ICD-10-CM | POA: Diagnosis present

## 2017-07-28 DIAGNOSIS — I4891 Unspecified atrial fibrillation: Secondary | ICD-10-CM | POA: Diagnosis present

## 2017-07-28 DIAGNOSIS — E669 Obesity, unspecified: Secondary | ICD-10-CM | POA: Diagnosis present

## 2017-07-28 DIAGNOSIS — H409 Unspecified glaucoma: Secondary | ICD-10-CM | POA: Diagnosis present

## 2017-07-28 DIAGNOSIS — Z96659 Presence of unspecified artificial knee joint: Secondary | ICD-10-CM | POA: Diagnosis present

## 2017-07-28 DIAGNOSIS — R05 Cough: Secondary | ICD-10-CM

## 2017-07-28 DIAGNOSIS — Z6833 Body mass index (BMI) 33.0-33.9, adult: Secondary | ICD-10-CM | POA: Diagnosis not present

## 2017-07-28 DIAGNOSIS — E1142 Type 2 diabetes mellitus with diabetic polyneuropathy: Secondary | ICD-10-CM | POA: Diagnosis present

## 2017-07-28 DIAGNOSIS — G4730 Sleep apnea, unspecified: Secondary | ICD-10-CM | POA: Diagnosis present

## 2017-07-28 DIAGNOSIS — F17210 Nicotine dependence, cigarettes, uncomplicated: Secondary | ICD-10-CM | POA: Diagnosis present

## 2017-07-28 DIAGNOSIS — R4182 Altered mental status, unspecified: Secondary | ICD-10-CM

## 2017-07-28 DIAGNOSIS — E876 Hypokalemia: Secondary | ICD-10-CM | POA: Diagnosis present

## 2017-07-28 DIAGNOSIS — E1100 Type 2 diabetes mellitus with hyperosmolarity without nonketotic hyperglycemic-hyperosmolar coma (NKHHC): Secondary | ICD-10-CM | POA: Diagnosis present

## 2017-07-28 DIAGNOSIS — R059 Cough, unspecified: Secondary | ICD-10-CM

## 2017-07-28 DIAGNOSIS — J45909 Unspecified asthma, uncomplicated: Secondary | ICD-10-CM | POA: Diagnosis present

## 2017-07-28 DIAGNOSIS — Z7982 Long term (current) use of aspirin: Secondary | ICD-10-CM | POA: Diagnosis not present

## 2017-07-28 DIAGNOSIS — E860 Dehydration: Secondary | ICD-10-CM | POA: Diagnosis present

## 2017-07-28 DIAGNOSIS — Z981 Arthrodesis status: Secondary | ICD-10-CM | POA: Diagnosis not present

## 2017-07-28 DIAGNOSIS — I10 Essential (primary) hypertension: Secondary | ICD-10-CM | POA: Diagnosis present

## 2017-07-28 LAB — URINALYSIS, COMPLETE (UACMP) WITH MICROSCOPIC
BILIRUBIN URINE: NEGATIVE
Bacteria, UA: NONE SEEN
Glucose, UA: >=500 mg/dL — AB
HGB URINE DIPSTICK: NEGATIVE
Ketones, ur: NEGATIVE mg/dL
Leukocytes, UA: NEGATIVE
NITRITE: NEGATIVE
PROTEIN: NEGATIVE mg/dL
Specific Gravity, Urine: 1.026 (ref 1.005–1.030)
pH: 6 (ref 5.0–8.0)

## 2017-07-28 LAB — HEPATIC FUNCTION PANEL
ALBUMIN: 3.9 g/dL (ref 3.5–5.0)
ALK PHOS: 104 U/L (ref 38–126)
ALT: 14 U/L (ref 14–54)
AST: 24 U/L (ref 15–41)
BILIRUBIN DIRECT: 0.2 mg/dL (ref 0.1–0.5)
BILIRUBIN INDIRECT: 1.1 mg/dL — AB (ref 0.3–0.9)
BILIRUBIN TOTAL: 1.3 mg/dL — AB (ref 0.3–1.2)
Total Protein: 8.5 g/dL — ABNORMAL HIGH (ref 6.5–8.1)

## 2017-07-28 LAB — BASIC METABOLIC PANEL
ANION GAP: 15 (ref 5–15)
BUN: 25 mg/dL — ABNORMAL HIGH (ref 6–20)
CALCIUM: 10.3 mg/dL (ref 8.9–10.3)
CO2: 26 mmol/L (ref 22–32)
Chloride: 77 mmol/L — ABNORMAL LOW (ref 101–111)
Creatinine, Ser: 1.39 mg/dL — ABNORMAL HIGH (ref 0.44–1.00)
GFR calc non Af Amer: 37 mL/min — ABNORMAL LOW (ref >60)
GFR, EST AFRICAN AMERICAN: 43 mL/min — AB (ref >60)
Glucose, Bld: 1186 mg/dL (ref 65–99)
POTASSIUM: 5.2 mmol/L — AB (ref 3.5–5.1)
Sodium: 118 mmol/L — CL (ref 135–145)

## 2017-07-28 LAB — TROPONIN I

## 2017-07-28 LAB — GLUCOSE, CAPILLARY
Glucose-Capillary: 258 mg/dL — ABNORMAL HIGH (ref 65–99)
Glucose-Capillary: 281 mg/dL — ABNORMAL HIGH (ref 65–99)
Glucose-Capillary: 318 mg/dL — ABNORMAL HIGH (ref 65–99)
Glucose-Capillary: 520 mg/dL (ref 65–99)
Glucose-Capillary: >600 mg/dL (ref 65–99)

## 2017-07-28 LAB — CBC
HCT: 41.1 % (ref 35.0–47.0)
HEMOGLOBIN: 12.6 g/dL (ref 12.0–16.0)
MCH: 25.5 pg — AB (ref 26.0–34.0)
MCHC: 30.7 g/dL — ABNORMAL LOW (ref 32.0–36.0)
MCV: 83 fL (ref 80.0–100.0)
Platelets: 271 10*3/uL (ref 150–440)
RBC: 4.95 MIL/uL (ref 3.80–5.20)
RDW: 15.8 % — ABNORMAL HIGH (ref 11.5–14.5)
WBC: 10.9 10*3/uL (ref 3.6–11.0)

## 2017-07-28 LAB — LIPID PANEL
CHOL/HDL RATIO: 3.8 ratio
CHOLESTEROL: 122 mg/dL (ref 0–200)
HDL: 32 mg/dL — ABNORMAL LOW (ref >40)
LDL CALC: 27 mg/dL (ref 0–99)
Triglycerides: 315 mg/dL — ABNORMAL HIGH (ref <150)
VLDL: 63 mg/dL — ABNORMAL HIGH (ref 0–40)

## 2017-07-28 LAB — MRSA PCR SCREENING: MRSA BY PCR: NEGATIVE

## 2017-07-28 MED ORDER — DEXTROSE-NACL 5-0.45 % IV SOLN
INTRAVENOUS | Status: DC
  Administered 2017-07-29: 01:00:00 via INTRAVENOUS

## 2017-07-28 MED ORDER — BENAZEPRIL-HYDROCHLOROTHIAZIDE 20-25 MG PO TABS
1.0000 | ORAL_TABLET | Freq: Every day | ORAL | Status: DC
Start: 2017-07-28 — End: 2017-07-28

## 2017-07-28 MED ORDER — VITAMIN D (ERGOCALCIFEROL) 1.25 MG (50000 UNIT) PO CAPS
50000.0000 [IU] | ORAL_CAPSULE | ORAL | Status: DC
  Administered 2017-07-28: 50000 [IU] via ORAL
  Filled 2017-07-28: qty 1

## 2017-07-28 MED ORDER — ASPIRIN 81 MG PO TABS
81.0000 mg | ORAL_TABLET | Freq: Every day | ORAL | Status: DC
  Filled 2017-07-28 (×2): qty 1

## 2017-07-28 MED ORDER — AMLODIPINE BESYLATE 10 MG PO TABS
10.0000 mg | ORAL_TABLET | Freq: Every day | ORAL | Status: DC
  Administered 2017-07-28 – 2017-07-31 (×4): 10 mg via ORAL
  Filled 2017-07-28 (×4): qty 1

## 2017-07-28 MED ORDER — DOCUSATE SODIUM 100 MG PO CAPS: 100.0000 mg | ORAL_CAPSULE | Freq: Two times a day (BID) | ORAL | Status: DC | PRN

## 2017-07-28 MED ORDER — SODIUM CHLORIDE 0.9 % IV BOLUS (SEPSIS)
1000.0000 mL | Freq: Once | INTRAVENOUS | Status: AC
  Administered 2017-07-28: 1000 mL via INTRAVENOUS

## 2017-07-28 MED ORDER — HYDROCHLOROTHIAZIDE 25 MG PO TABS
25.0000 mg | ORAL_TABLET | Freq: Every day | ORAL | Status: DC
  Administered 2017-07-28 – 2017-07-29 (×2): 25 mg via ORAL
  Filled 2017-07-28 (×3): qty 1

## 2017-07-28 MED ORDER — FERROUS SULFATE 325 (65 FE) MG PO TBEC
325.0000 mg | DELAYED_RELEASE_TABLET | Freq: Two times a day (BID) | ORAL | Status: DC
  Administered 2017-07-28 – 2017-07-31 (×6): 325 mg via ORAL
  Filled 2017-07-28 (×8): qty 1

## 2017-07-28 MED ORDER — PANTOPRAZOLE SODIUM 40 MG PO TBEC
40.0000 mg | DELAYED_RELEASE_TABLET | Freq: Every day | ORAL | Status: DC
  Administered 2017-07-28 – 2017-07-31 (×4): 40 mg via ORAL
  Filled 2017-07-28 (×4): qty 1

## 2017-07-28 MED ORDER — ASPIRIN EC 81 MG PO TBEC
81.0000 mg | DELAYED_RELEASE_TABLET | Freq: Every day | ORAL | Status: DC
  Administered 2017-07-28 – 2017-07-31 (×4): 81 mg via ORAL
  Filled 2017-07-28 (×3): qty 1

## 2017-07-28 MED ORDER — SODIUM CHLORIDE 0.9 % IV SOLN
INTRAVENOUS | Status: DC
  Administered 2017-07-28 – 2017-07-30 (×2): via INTRAVENOUS

## 2017-07-28 MED ORDER — IPRATROPIUM-ALBUTEROL 18-103 MCG/ACT IN AERO: 1.0000 | INHALATION_SPRAY | Freq: Four times a day (QID) | RESPIRATORY_TRACT | Status: DC | PRN

## 2017-07-28 MED ORDER — HEPARIN SODIUM (PORCINE) 5000 UNIT/ML IJ SOLN
5000.0000 [IU] | Freq: Three times a day (TID) | INTRAMUSCULAR | Status: DC
  Administered 2017-07-28 – 2017-07-29 (×2): 5000 [IU] via SUBCUTANEOUS
  Filled 2017-07-28 (×2): qty 1

## 2017-07-28 MED ORDER — BUDESONIDE 0.25 MG/2ML IN SUSP
0.2500 mg | Freq: Two times a day (BID) | RESPIRATORY_TRACT | Status: DC
  Administered 2017-07-28 – 2017-07-31 (×6): 0.25 mg via RESPIRATORY_TRACT
  Filled 2017-07-28 (×6): qty 2

## 2017-07-28 MED ORDER — FLUTICASONE PROPIONATE HFA 110 MCG/ACT IN AERO: 1.0000 | INHALATION_SPRAY | Freq: Two times a day (BID) | RESPIRATORY_TRACT | Status: DC

## 2017-07-28 MED ORDER — SODIUM CHLORIDE 0.9 % IV SOLN
INTRAVENOUS | Status: DC
  Administered 2017-07-28: 5.4 [IU]/h via INTRAVENOUS
  Administered 2017-07-29: 6.6 [IU]/h via INTRAVENOUS
  Filled 2017-07-28 (×3): qty 1

## 2017-07-28 MED ORDER — MORPHINE SULFATE (PF) 2 MG/ML IV SOLN
2.0000 mg | INTRAVENOUS | Status: AC
  Administered 2017-07-28: 2 mg via INTRAVENOUS
  Filled 2017-07-28: qty 1

## 2017-07-28 MED ORDER — LATANOPROST 0.005 % OP SOLN
1.0000 [drp] | Freq: Every day | OPHTHALMIC | Status: DC
  Administered 2017-07-28 – 2017-07-30 (×3): 1 [drp] via OPHTHALMIC
  Filled 2017-07-28 (×2): qty 2.5

## 2017-07-28 MED ORDER — IPRATROPIUM-ALBUTEROL 0.5-2.5 (3) MG/3ML IN SOLN
3.0000 mL | Freq: Four times a day (QID) | RESPIRATORY_TRACT | Status: DC | PRN
  Administered 2017-07-30: 3 mL via RESPIRATORY_TRACT
  Filled 2017-07-28: qty 3

## 2017-07-28 MED ORDER — PREGABALIN 50 MG PO CAPS
150.0000 mg | ORAL_CAPSULE | Freq: Every day | ORAL | Status: DC
  Administered 2017-07-28 – 2017-07-30 (×3): 150 mg via ORAL
  Filled 2017-07-28: qty 3
  Filled 2017-07-28: qty 2
  Filled 2017-07-28: qty 3
  Filled 2017-07-28: qty 2

## 2017-07-28 MED ORDER — BENAZEPRIL HCL 20 MG PO TABS
20.0000 mg | ORAL_TABLET | Freq: Every day | ORAL | Status: DC
  Administered 2017-07-28 – 2017-07-29 (×2): 20 mg via ORAL
  Filled 2017-07-28 (×3): qty 1

## 2017-07-28 NOTE — Consult Note (Signed)
Name: Lynn Potter MRN: [id number] DOB: 05/02/1946    ADMISSION DATE:  07/28/2017  CONSULTATION DATE: 07/28/17  REFERRING MD : Dr.Vachhani  CHIEF COMPLAINT: Abdominal Pain  BRIEF PATIENT DESCRIPTION: 71 year old female with hyperglycemia  SIGNIFICANT EVENTS  10/23>>  Patient admitted to the SDU with hyperglycemia  STUDIES:  07/28/17 CT head>>Mild diffuse cortical atrophy. Mild chronic ischemic white matter disease. No acute intracranial abnormality seen.  HISTORY OF PRESENT ILLNESS: Lynn Potter is a 71 year old female with known history of Atrial Fibrillation,Hypertension, Migraines,obesity, opiate use,tobaccoo abuse and sleep apnea.  Patient appeared to be confused and therefore was brought to ED.  Her blood glucose was noted to be 1200mg  /dl without DKA.  Patient was started on insulin gtt and was sent to the SDU for further monitoring.  PAST MEDICAL HISTORY :   has a past medical history of AF (atrial fibrillation) ([place]); Arthritis; Asthma; Chicken pox; Glaucoma; Hemorrhoids; History of hiatal hernia; Hypertension; Migraines; Obesity; Opiate use (03/16/2017); Personal history of tobacco use, presenting hazards to health (06/18/2015); and Sleep apnea.  has a past surgical history that includes Joint replacement; Total knee arthroplasty; Abdominal hysterectomy; Cholecystectomy; Colonoscopy with esophagogastroduodenoscopy (egd); carpal tunnell; Eye surgery; Cataract extraction; Back surgery; Lumbar fusion; Upper esophageal endoscopic ultrasound (eus) (N/A, 01/03/2016); and Esophagogastroduodenoscopy (egd) with propofol (N/A, 02/16/2017). Prior to Admission medications   Medication Sig Start Date End Date Taking? Authorizing Provider  albuterol-ipratropium (COMBIVENT) 18-103 MCG/ACT inhaler Inhale 1 puff into the lungs every 6 (six) hours as needed for wheezing or shortness of breath.   Yes [provider]  amLODipine (NORVASC) 10 MG tablet Take 1 tablet by mouth daily. 07/01/17   Yes [provider]  Ascorbic Acid (VITAMIN C) 1000 MG tablet Take 1,000 mg by mouth daily.   Yes [provider]  aspirin 81 MG tablet Take 81 mg by mouth daily.   Yes [provider]  benazepril-hydrochlorthiazide (LOTENSIN HCT) 20-25 MG tablet Take 1 tablet by mouth daily.   Yes [provider]  ciprofloxacin (CIPRO) 250 MG tablet Take 1 tablet by mouth 2 (two) times daily. 07/16/17  Yes [provider]  diazepam (VALIUM) 5 MG tablet Take 1 tablet by mouth daily. 06/04/17  Yes [provider]  ferrous sulfate 325 (65 FE) MG EC tablet Take 325 mg by mouth 2 (two) times daily.    Yes [provider]  fluticasone (FLOVENT HFA) 110 MCG/ACT inhaler Inhale into the lungs 2 (two) times daily.   Yes [provider]  latanoprost (XALATAN) 0.005 % ophthalmic solution 1 drop at bedtime.   Yes [provider]  nitrofurantoin, macrocrystal-monohydrate, (MACROBID) 100 MG capsule Take 1 capsule by mouth 2 (two) times daily. 07/27/17  Yes [provider]  pantoprazole (PROTONIX) 40 MG tablet Take 1 tablet by mouth daily. 01/23/17  Yes [provider]  pregabalin (LYRICA) 150 MG capsule Take 150 mg by mouth at bedtime.    Yes [provider]  Vitamin D, Ergocalciferol, (DRISDOL) 50000 units CAPS capsule Take 50,000 Units by mouth every 7 (seven) days.   Yes [provider]   No Known Allergies  FAMILY HISTORY:  family history includes Diabetes in her mother and sister. SOCIAL HISTORY:  reports that she has been smoking Cigarettes.  She has been smoking about 1.00 pack per day. She has never used smokeless tobacco. She reports that she does not drink alcohol or use drugs.  REVIEW OF SYSTEMS:   Constitutional: Negative for fever,  chills, weight loss, malaise/fatigue and diaphoresis.  HENT: Negative for hearing loss, ear pain, nosebleeds, congestion, sore throat, neck pain, tinnitus and ear  discharge.   Eyes: Negative for blurred vision, double vision, photophobia, pain, discharge and redness.  Respiratory: Negative for cough, hemoptysis, sputum production, shortness of breath, wheezing and stridor.   Cardiovascular: Negative for chest pain, palpitations, orthopnea, claudication, leg swelling and PND.  Gastrointestinal: Negative for heartburn, nausea, vomiting, abdominal pain, diarrhea, constipation, blood in stool and melena.  Genitourinary: Negative for dysuria, urgency, frequency, hematuria and flank pain.  Musculoskeletal: Negative for myalgias, back pain, joint pain and falls.  Skin: Negative for itching and rash.  Neurological: Negative for dizziness, tingling, tremors, sensory change, speech change, focal weakness, seizures, loss of consciousness, weakness and headaches.  Endo/Heme/Allergies: Negative for environmental allergies and polydipsia. Does not bruise/bleed easily.  SUBJECTIVE: Patient states that "she has some abdominal pain but it is getting better now"  VITAL SIGNS: Temp:  [98.1 F (36.7 C)-98.9 F (37.2 C)] 98.1 F (36.7 C) (10/23 1856) Pulse Rate:  [84-94] 91 (10/23 1900) Resp:  [16-25] 18 (10/23 1900) BP: (122-149)/(83-92) 126/83 (10/23 1900) SpO2:  [98 %-100 %] 98 % (10/23 1900) Weight:  [203 lb (92.1 kg)] 203 lb (92.1 kg) (10/23 1518)  PHYSICAL EXAMINATION: General:  Morbidly obese African American female,in no acute distress Neuro: Awake,Alert and oriented HEENT: AT,[place],No jvd Cardiovascular:s1s2,regular,no m/r/g Lungs: Clear bilaterally, no wheezes,crackles,rhonchi noted Abdomen:  Soft,NT,ND,Positive Bowel sounds Musculoskeletal:  No edema,cyanosis noted Skin:  Warm,dry and intact   Recent Labs Lab 07/28/17 1534  NA 118*  K 5.2*  CL 77*  CO2 26  BUN 25*  CREATININE 1.39*  GLUCOSE 1,186*    Recent Labs Lab 07/28/17 1534  HGB 12.6  HCT 41.1  WBC 10.9  PLT 271   Ct Head Wo Contrast  Result Date: 07/28/2017 CLINICAL DATA:   Altered level of consciousness. EXAM: CT HEAD WITHOUT CONTRAST TECHNIQUE: Contiguous axial images were obtained from the base of the skull through the vertex without intravenous contrast. COMPARISON:  CT scan of June 09, 2017. FINDINGS: Brain: Mild diffuse cortical atrophy is noted. Mild chronic ischemic white matter disease is noted. No mass effect or midline shift is noted. Ventricular size is within normal limits. There is no evidence of mass lesion, hemorrhage or acute infarction. Vascular: No hyperdense vessel or unexpected calcification. Skull: Normal. Negative for fracture or focal lesion. Sinuses/Orbits: No acute finding. Other: None. IMPRESSION: Mild diffuse cortical atrophy. Mild chronic ischemic white matter disease. No acute intracranial abnormality seen. Electronically Signed   By: Marijo Conception, M.D.   On: 07/28/2017 16:11   Dg Chest Port 1 View  Result Date: 07/28/2017 CLINICAL DATA:  Hyperglycemia. Lethargy. Generalized weakness. Recent treatment for urinary tract infection. EXAM: PORTABLE CHEST 1 VIEW COMPARISON:  Screening CT chest 11/20/2016, 11/05/2015 and earlier. Chest x-rays 09/30/2016, 07/06/2007. FINDINGS: Cardiac silhouette mildly enlarged, unchanged. Thoracic aorta mildly tortuous, unchanged. Hilar and mediastinal contours otherwise unremarkable. Lungs clear. Bronchovascular markings normal. Pulmonary vascularity normal. No visible pleural effusions. No pneumothorax. Degenerative changes involving the shoulder joints as noted previously. IMPRESSION: Stable cardiomegaly.  No acute cardiopulmonary disease. Electronically Signed   By: Evangeline Dakin M.D.   On: 07/28/2017 16:14    ASSESSMENT / PLAN:  Non Ketotic Hyperglycemia Hyponatremia related to dehydration Hyperkalemia Acute Kidney Injury related to dehydration Tobacco abuse    Continue I/V fluids Continue Insulin gtt Follow HBA1c,lipid panel Start D51/2 once the blood glucose250mg /dl Transition from insulin  gtt  one 4 consecutive blood glucose 180mg /dl Continue Aspirin Continue antihypertensives Smoking Cessation councelling  Bincy Varughese,AG-ACNP [facility] [place] 07/28/2017, 7:39 PM    Merton Border, MD PCCM service Mobile [phone] Pager [phone] 07/29/2017 1:09 PM

## 2017-07-28 NOTE — H&P (Addendum)
[facility] [place] NAME: Lynn Potter    MR#:  [id number]  DATE OF BIRTH:  1945/12/22  DATE OF ADMISSION:  07/28/2017  PRIMARY CARE PHYSICIAN: Remi Haggard, FNP   REQUESTING/REFERRING PHYSICIAN: Rifenbark  CHIEF COMPLAINT:   Chief Complaint  Patient presents with  . Abdominal Pain    HISTORY OF PRESENT ILLNESS: Lynn Potter  is a 71 y.o. female with a known history of A fib, Asthma, Hypertension, Migraines- Lives at apartment building. Had some nausea and feeling weak for last 2 days. Noted to be acting confused today by her friends there. In ER noted to have Blood sugar 1200, without DKA.  PAST MEDICAL HISTORY:   Past Medical History:  Diagnosis Date  . AF (atrial fibrillation) ([place])   . Arthritis   . Asthma   . Chicken pox   . Glaucoma   . Hemorrhoids   . History of hiatal hernia   . Hypertension   . Migraines   . Obesity   . Opiate use 03/16/2017  . Personal history of tobacco use, presenting hazards to health 06/18/2015  . Sleep apnea     PAST SURGICAL HISTORY: Past Surgical History:  Procedure Laterality Date  . ABDOMINAL HYSTERECTOMY    . BACK SURGERY    . carpal tunnell    . CATARACT EXTRACTION    . CHOLECYSTECTOMY    . COLONOSCOPY WITH ESOPHAGOGASTRODUODENOSCOPY (EGD)    . ESOPHAGOGASTRODUODENOSCOPY (EGD) WITH PROPOFOL N/A 02/16/2017   Procedure: ESOPHAGOGASTRODUODENOSCOPY (EGD) WITH PROPOFOL;  Surgeon: Manya Silvas, MD;  Location: [facility] ENDOSCOPY;  Service: Endoscopy;  Laterality: N/A;  . EYE SURGERY    . JOINT REPLACEMENT    . LUMBAR FUSION    . TOTAL KNEE ARTHROPLASTY    . UPPER ESOPHAGEAL ENDOSCOPIC ULTRASOUND (EUS) N/A 01/03/2016   Procedure: UPPER ESOPHAGEAL ENDOSCOPIC ULTRASOUND (EUS);  Surgeon: Holly Bodily, MD;  [facility] END[facility];  Service: Gastroenterology;  Laterality: N/A;    SOCIAL HISTORY:  Social History  Substance Use Topics  . Smoking status: Current Every Day Smoker     Packs/day: 1.00    Types: Cigarettes  . Smokeless tobacco: Never Used  . Alcohol use No    FAMILY HISTORY:  Family History  Problem Relation Age of Onset  . Diabetes Mother   . Diabetes Sister     DRUG ALLERGIES: No Known Allergies  REVIEW OF SYSTEMS:   CONSTITUTIONAL: No fever,positive for fatigue or weakness.  EYES: No blurred or double vision.  EARS, NOSE, AND THROAT: No tinnitus or ear pain.  RESPIRATORY: No cough, shortness of breath, wheezing or hemoptysis.  CARDIOVASCULAR: No chest pain, orthopnea, edema.  GASTROINTESTINAL: No nausea, vomiting, diarrhea or abdominal pain.  GENITOURINARY: No dysuria, hematuria.  ENDOCRINE: No polyuria, nocturia,  HEMATOLOGY: No anemia, easy bruising or bleeding SKIN: No rash or lesion. MUSCULOSKELETAL: No joint pain or arthritis.   NEUROLOGIC: No tingling, numbness, weakness.  PSYCHIATRY: No anxiety or depression.   MEDICATIONS AT HOME:  Prior to Admission medications   Medication Sig Start Date End Date Taking? Authorizing Provider  albuterol-ipratropium (COMBIVENT) 18-103 MCG/ACT inhaler Inhale 1 puff into the lungs every 6 (six) hours as needed for wheezing or shortness of breath.   Yes [provider]  amLODipine (NORVASC) 10 MG tablet Take 1 tablet by mouth daily. 07/01/17  Yes [provider]  Ascorbic Acid (VITAMIN C) 1000 MG tablet Take 1,000 mg by mouth daily.   Yes [provider]  aspirin 81 MG tablet Take 81 mg by mouth daily.   Yes [provider]  benazepril-hydrochlorthiazide (LOTENSIN HCT) 20-25 MG tablet Take 1 tablet by mouth daily.   Yes [provider]  ciprofloxacin (CIPRO) 250 MG tablet Take 1 tablet by mouth 2 (two) times daily. 07/16/17  Yes [provider]  diazepam (VALIUM) 5 MG tablet Take 1 tablet by mouth daily. 06/04/17  Yes [provider]  ferrous sulfate 325 (65 FE) MG EC tablet Take 325 mg by mouth 2 (two) times daily.    Yes [provider]  fluticasone (FLOVENT HFA) 110 MCG/ACT inhaler Inhale into the lungs 2 (two) times daily.   Yes [provider]  latanoprost (XALATAN) 0.005 % ophthalmic solution 1 drop at bedtime.   Yes [provider]  nitrofurantoin, macrocrystal-monohydrate, (MACROBID) 100 MG capsule Take 1 capsule by mouth 2 (two) times daily. 07/27/17  Yes [provider]  pantoprazole (PROTONIX) 40 MG tablet Take 1 tablet by mouth daily. 01/23/17  Yes [provider]  pregabalin (LYRICA) 150 MG capsule Take 150 mg by mouth at bedtime.    Yes [provider]  Vitamin D, Ergocalciferol, (DRISDOL) 50000 units CAPS capsule Take 50,000 Units by mouth every 7 (seven) days.   Yes [provider]      PHYSICAL EXAMINATION:   VITAL SIGNS: Blood pressure (!) 136/91, pulse 89, temperature 98.9 F (37.2 C), temperature source Oral, resp. rate 18, height 5\' 5"  (1.651 m), weight 92.1 kg (203 lb), SpO2 100 %.  GENERAL:  71 y.o.-year-old patient lying in the bed with no acute distress.  EYES: Pupils equal, round, reactive to light and accommodation. No scleral icterus. Extraocular muscles intact.  HEENT: Head atraumatic, normocephalic. Oropharynx and nasopharynx clear.  NECK:  Supple, no jugular venous distention. No thyroid enlargement, no tenderness.  LUNGS: Normal breath sounds bilaterally, no wheezing, rales,rhonchi or crepitation. No use of accessory muscles of respiration.  CARDIOVASCULAR: S1, S2 normal. No murmurs, rubs, or gallops.  ABDOMEN: Soft, nontender, nondistended. Bowel sounds present. No organomegaly or mass.  EXTREMITIES: No pedal edema, cyanosis, or clubbing.  NEUROLOGIC: Cranial nerves II through XII are intact. Muscle strength 5/5 in all extremities. Sensation intact. Gait not checked.  PSYCHIATRIC: The patient is alert and oriented x 3.  SKIN: No obvious rash, lesion, or ulcer.   LABORATORY PANEL:   CBC  Recent Labs Lab 07/28/17 1534   WBC 10.9  HGB 12.6  HCT 41.1  PLT 271  MCV 83.0  MCH 25.5*  MCHC 30.7*  RDW 15.8*   ------------------------------------------------------------------------------------------------------------------  Chemistries   Recent Labs Lab 07/28/17 1534  NA 118*  K 5.2*  CL 77*  CO2 26  GLUCOSE 1,186*  BUN 25*  CREATININE 1.39*  CALCIUM 10.3  AST 24  ALT 14  ALKPHOS 104  BILITOT 1.3*   ------------------------------------------------------------------------------------------------------------------ estimated creatinine clearance is 41 mL/min (A) (by C-G formula based on SCr of 1.39 mg/dL (H)). ------------------------------------------------------------------------------------------------------------------ No results for input(s): TSH, T4TOTAL, T3FREE, THYROIDAB in the last 72 hours.  Invalid input(s): FREET3   Coagulation profile No results for input(s): INR, PROTIME in the last 168 hours. ------------------------------------------------------------------------------------------------------------------- No results for input(s): DDIMER in the last 72 hours. -------------------------------------------------------------------------------------------------------------------  Cardiac Enzymes  Recent Labs Lab 07/28/17 1534  TROPONINI <0.03   ------------------------------------------------------------------------------------------------------------------ Invalid input(s): POCBNP  ---------------------------------------------------------------------------------------------------------------  Urinalysis    Component Value Date/Time   COLORURINE COLORLESS (A) 07/28/2017 1534   APPEARANCEUR CLEAR (A) 07/28/2017 1534   LABSPEC 1.026 07/28/2017 1534  PHURINE 6.0 07/28/2017 1534   GLUCOSEU >=500 (A) 07/28/2017 1534   HGBUR NEGATIVE 07/28/2017 1534   BILIRUBINUR NEGATIVE 07/28/2017 1534   KETONESUR NEGATIVE 07/28/2017 1534   PROTEINUR NEGATIVE 07/28/2017 1534   NITRITE  NEGATIVE 07/28/2017 1534   LEUKOCYTESUR NEGATIVE 07/28/2017 1534     RADIOLOGY: Ct Head Wo Contrast  Result Date: 07/28/2017 CLINICAL DATA:  Altered level of consciousness. EXAM: CT HEAD WITHOUT CONTRAST TECHNIQUE: Contiguous axial images were obtained from the base of the skull through the vertex without intravenous contrast. COMPARISON:  CT scan of June 09, 2017. FINDINGS: Brain: Mild diffuse cortical atrophy is noted. Mild chronic ischemic white matter disease is noted. No mass effect or midline shift is noted. Ventricular size is within normal limits. There is no evidence of mass lesion, hemorrhage or acute infarction. Vascular: No hyperdense vessel or unexpected calcification. Skull: Normal. Negative for fracture or focal lesion. Sinuses/Orbits: No acute finding. Other: None. IMPRESSION: Mild diffuse cortical atrophy. Mild chronic ischemic white matter disease. No acute intracranial abnormality seen. Electronically Signed   By: Marijo Conception, M.D.   On: 07/28/2017 16:11   Dg Chest Port 1 View  Result Date: 07/28/2017 CLINICAL DATA:  Hyperglycemia. Lethargy. Generalized weakness. Recent treatment for urinary tract infection. EXAM: PORTABLE CHEST 1 VIEW COMPARISON:  Screening CT chest 11/20/2016, 11/05/2015 and earlier. Chest x-rays 09/30/2016, 07/06/2007. FINDINGS: Cardiac silhouette mildly enlarged, unchanged. Thoracic aorta mildly tortuous, unchanged. Hilar and mediastinal contours otherwise unremarkable. Lungs clear. Bronchovascular markings normal. Pulmonary vascularity normal. No visible pleural effusions. No pneumothorax. Degenerative changes involving the shoulder joints as noted previously. IMPRESSION: Stable cardiomegaly.  No acute cardiopulmonary disease. Electronically Signed   By: Evangeline Dakin M.D.   On: 07/28/2017 16:14    EKG: Orders placed or performed during the hospital encounter of 07/28/17  . EKG 12-Lead  . EKG 12-Lead  . ED EKG  . ED EKG  . EKG 12-Lead  . EKG  12-Lead    IMPRESSION AND PLAN:  * non ketotic Hyperglycemia   IV fluids, boluses given.   Insuline drip.   May need to start on insuline and follow with PMD   Check HBA1c and lipid panel, dietary consult.  * Hypertension   Cont meds and monitor.  * Hyponatremia   Due to hyperglycemia   Monitor, IV fluids.  * Hyperkalemia   Might come down with IV insuline drip.  * Smoking   Councelled to Quit smokign for 4 min.  All the records are reviewed and case discussed with ED provider. Management plans discussed with the patient, family and they are in agreement.  CODE STATUS: Full. Code Status History    Date Active Date Inactive Code Status Order ID Comments User Context   09/30/2016  5:13 PM 10/02/2016  8:13 PM Full Code [id number]  Hower, Aaron Mose, MD ED      Spoke to her daughter and sister in room. TOTAL TIME TAKING CARE OF THIS PATIENT: 50 critical care minutes.    Vaughan Basta M.D on 07/28/2017   Between 7am to 6pm - Pager - [phone]  After 6pm go to [email or web address] - password [email or web address]  Office  [phone]  CC: Primary care physician; Remi Haggard, FNP   Note: This dictation was prepared with Dragon dictation along with smaller phrase technology. Any transcriptional errors that result from this process are unintentional.

## 2017-07-28 NOTE — ED Notes (Signed)
Pt arrives via EMS from home, states not feeling well over 1 week, states she is currently on her 2nd round of abx for a UTI, CBG "high", hx of prediabetes, 150/72, HR 102, 97% RA, awake and alert in no acute distress

## 2017-07-28 NOTE — ED Provider Notes (Signed)
[facility] Emergency Department Provider Note  ____________________________________________   First MD Initiated Contact with Patient 07/28/17 1546     (approximate)  I have reviewed the triage vital signs and the nursing notes.   HISTORY  Chief Complaint Abdominal Pain  Level V exemption history Limited by the patient's altered mental status  HPI [name] is a 71 y.o. female who comes to the emergency department via EMS for roughly 1 week of generalized fatigue polyuria and polydipsia. History largely obtained by the patient's family member at bedside as the patient is speaking slowly and confused. Apparently the patient lives at home alone today went downstairs to play bingo with friends and they noted that she is not behaving normally and that her speech was slow and methodical so they called 911. Prehospital her glucose was critical high. She has no history of diabetes.   Past Medical History:  Diagnosis Date  . AF (atrial fibrillation) ([place])   . Arthritis   . Asthma   . Chicken pox   . Glaucoma   . Hemorrhoids   . History of hiatal hernia   . Hypertension   . Migraines   . Obesity   . Opiate use 03/16/2017  . Personal history of tobacco use, presenting hazards to health 06/18/2015  . Sleep apnea     Patient Active Problem List   Diagnosis Date Noted  . Vitamin D insufficiency 03/25/2017  . Low back pain (primary )(left greater than right) 03/17/2017  . Chronic pain syndrome 03/16/2017  . Encounter for long-term opiate analgesic use 03/16/2017  . Long term current use of opiate analgesic 03/16/2017  . Opiate use 03/16/2017  . 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 Sacroiliac joint pain 03/16/2017  . Sepsis ([place]) 09/30/2016  . Primary osteoarthritis of right shoulder 10/17/2015  . Personal history of tobacco use, presenting hazards to health 06/18/2015  . Osteoarthritis of shoulder region 03/10/2014  . Rotator cuff  tendinitis 03/10/2014  . S/P rotator cuff repair 03/10/2014    Past Surgical History:  Procedure Laterality Date  . ABDOMINAL HYSTERECTOMY    . BACK SURGERY    . carpal tunnell    . CATARACT EXTRACTION    . CHOLECYSTECTOMY    . COLONOSCOPY WITH ESOPHAGOGASTRODUODENOSCOPY (EGD)    . ESOPHAGOGASTRODUODENOSCOPY (EGD) WITH PROPOFOL N/A 02/16/2017   Procedure: ESOPHAGOGASTRODUODENOSCOPY (EGD) WITH PROPOFOL;  Surgeon: Manya Silvas, MD;  Location[facility] ENDOSCOPY;  Service: Endoscopy;  Laterality: N/A;  . EYE SURGERY    . JOINT REPLACEMENT    . LUMBAR FUSION    . TOTAL KNEE ARTHROPLASTY    . UPPER ESOPHAGEAL ENDOSCOPIC ULTRASOUND (EUS) N/A 01/03/2016   Procedure: UPPER ESOPHAGEAL ENDOSCOPIC ULTRASOUND (EUS);  Surgeon: Holly Bodily, MD;  Location: [facility] ENDOSCOPY;  Service: Gastroenterology;  Laterality: N/A;    Prior to Admission medications   Medication Sig Start Date End Date Taking? Authorizing Provider  albuterol-ipratropium (COMBIVENT) 18-103 MCG/ACT inhaler Inhale 1 puff into the lungs every 6 (six) hours as needed for wheezing or shortness of breath.   Yes [provider]  Ascorbic Acid (VITAMIN C) 1000 MG tablet Take 1,000 mg by mouth daily.   Yes [provider]  aspirin 81 MG tablet Take 81 mg by mouth daily.   Yes [provider]  benazepril-hydrochlorthiazide (LOTENSIN HCT) 20-25 MG tablet Take 1 tablet by mouth daily.   Yes [provider]  ferrous sulfate 325 (65 FE) MG EC tablet Take 325  mg by mouth 2 (two) times daily.    Yes [provider]  fluticasone (FLOVENT HFA) 110 MCG/ACT inhaler Inhale into the lungs 2 (two) times daily.   Yes [provider]  latanoprost (XALATAN) 0.005 % ophthalmic solution 1 drop at bedtime.   Yes [provider]  pregabalin (LYRICA) 150 MG capsule Take 150 mg by mouth at bedtime.    Yes [provider]  Vitamin D, Ergocalciferol, (DRISDOL) 50000 units CAPS capsule  Take 50,000 Units by mouth every 7 (seven) days.   Yes [provider]    Allergies Patient has no known allergies.  Family History  Problem Relation Age of Onset  . Diabetes Neg Hx     Social History Social History  Substance Use Topics  . Smoking status: Current Every Day Smoker    Packs/day: 1.00    Types: Cigarettes  . Smokeless tobacco: Never Used  . Alcohol use No    Review of Systems Level V exemption history Limited by the patient's clinical condition ____________________________________________   PHYSICAL EXAM:  VITAL SIGNS: ED Triage Vitals  Enc Vitals Group     BP 07/28/17 1517 (!) 136/91     Pulse Rate 07/28/17 1517 89     Resp 07/28/17 1517 18     Temp 07/28/17 1517 98.9 F (37.2 C)     Temp Source 07/28/17 1517 Oral     SpO2 07/28/17 1517 100 %     Weight 07/28/17 1518 203 lb (92.1 kg)     Height 07/28/17 1518 5\' 5"  (1.651 m)     Head Circumference --      Peak Flow --      Pain Score 07/28/17 1524 9     Pain Loc --      Pain Edu? --      Excl. in [place]? --     Constitutional: Slow methodical speech alert and oriented 4 although clearly confused no acute distress Eyes: PERRL EOMI. Head: Atraumatic. Nose: No congestion/rhinnorhea. Mouth/Throat: No trismus Neck: No stridor.   Cardiovascular: Normal rate, regular rhythm. Grossly normal heart sounds.  Good peripheral circulation. Respiratory: Normal respiratory effort.  No retractions. Lungs CTAB and moving good air Gastrointestinal: Soft nontender Musculoskeletal: No lower extremity edema   Neurologic: No gross focal neurologic deficits are appreciated. Skin:  Skin is warm, dry and intact. No rash noted.    ____________________________________________   DIFFERENTIAL includes but not limited to  Diabetes, diabetic ketoacidosis, HHS, metabolic derangement, sepsis, urinary tract infection ____________________________________________   LABS (all labs ordered are listed, but only  abnormal results are displayed)  Labs Reviewed  BASIC METABOLIC PANEL - Abnormal; Notable for the following:       Result Value   Sodium 118 (*)    Potassium 5.2 (*)    Chloride 77 (*)    Glucose, Bld 1,186 (*)    BUN 25 (*)    Creatinine, Ser 1.39 (*)    GFR calc non Af Amer 37 (*)    GFR calc Af Amer 43 (*)    All other components within normal limits  CBC - Abnormal; Notable for the following:    MCH 25.5 (*)    MCHC 30.7 (*)    RDW 15.8 (*)    All other components within normal limits  URINALYSIS, COMPLETE (UACMP) WITH MICROSCOPIC - Abnormal; Notable for the following:    Color, Urine COLORLESS (*)    APPearance CLEAR (*)    Glucose, UA >=500 (*)  Squamous Epithelial / LPF 0-5 (*)    All other components within normal limits  GLUCOSE, CAPILLARY - Abnormal; Notable for the following:    Glucose-Capillary >600 (*)    All other components within normal limits  HEPATIC FUNCTION PANEL - Abnormal; Notable for the following:    Total Protein 8.5 (*)    Total Bilirubin 1.3 (*)    Indirect Bilirubin 1.1 (*)    All other components within normal limits  TROPONIN I  CBG MONITORING, ED    Bloodwork reviewed and interpreted by me shows a number of abnormalities. Her glucose is 1186, her sodium is 118 although corrects to 135, normal anion gap, she does have acute kidney injury __________________________________________  EKG  ED ECG REPORT I, Darel Hong, the attending physician, personally viewed and interpreted this ECG. Date: 07/28/2017 EKG Time:  Rate: 98 Rhythm: normal sinus rhythm QRS Axis: normal Intervals: normal ST/T Wave abnormalities: normal Narrative Interpretation: no evidence of acute ischemia  ____________________________________________  RADIOLOGY  Head CT reviewed by me shows no acute disease Chest x-ray reviewed by me shows no acute disease ____________________________________________   PROCEDURES  Procedure(s) performed:  no  Procedures  Critical Care performed: yes  CRITICAL CARE Performed by: Darel Hong   Total critical care time: 35 minutes  Critical care time was exclusive of separately billable procedures and treating other patients.  Critical care was necessary to treat or prevent imminent or life-threatening deterioration.  Critical care was time spent personally by me on the following activities: development of treatment plan with patient and/or surrogate as well as nursing, discussions with consultants, evaluation of patient's response to treatment, examination of patient, obtaining history from patient or surrogate, ordering and performing treatments and interventions, ordering and review of laboratory studies, ordering and review of radiographic studies, pulse oximetry and re-evaluation of patient's condition.   Observation: no ____________________________________________   INITIAL IMPRESSION / ASSESSMENT AND PLAN / ED COURSE  Pertinent labs & imaging results that were available during my care of the patient were reviewed by me and considered in my medical decision making (see chart for details).  The patient arrives grossly neurologically intact with a critical high blood glucose greater than 600. She has no history of diabetes mellitus. Differential is broad but includes hyperosmolar hyperglycemic state, diabetic ketoacidosis, acute coronary syndrome, sepsis, etc.     ----------------------------------------- 4:50 PM on 07/28/2017 -----------------------------------------  The patient's sodium corrects to 135 and her osmolality calculates to 311. Her anion gap is 15. At this point her symptoms and blood work are consistent with hyperosmolar hyperglycemic state so I will continue aggressive IV fluid hydration but she will require an insulin drip and inpatient admission as well. ____________________________________________   FINAL CLINICAL IMPRESSION(S) / ED DIAGNOSES  Final  diagnoses:  Hyperosmolar non-ketotic state in patient with type 2 diabetes mellitus ([place])  Altered mental status, unspecified altered mental status type      NEW MEDICATIONS STARTED DURING THIS VISIT:  New Prescriptions   No medications on file     Note:  This document was prepared using Dragon voice recognition software and may include unintentional dictation errors.     Darel Hong, MD 07/28/17 1659

## 2017-07-28 NOTE — ED Triage Notes (Signed)
Pt c/o lower abdominal cramping for 3 weeks. Took abx for UTI but still having cramping.  Pt c/o being weak all over and feeling shaky.  Has dysuria.  Glucose critical high > 600 in triage. Pt is currently a little drowsy but has not been acting herself per other residents at [facility].  Pt is not a known diabetic.

## 2017-07-29 LAB — CBC
HCT: 35.5 % (ref 35.0–47.0)
HEMOGLOBIN: 11.5 g/dL — AB (ref 12.0–16.0)
MCH: 25.6 pg — ABNORMAL LOW (ref 26.0–34.0)
MCHC: 32.4 g/dL (ref 32.0–36.0)
MCV: 79 fL — ABNORMAL LOW (ref 80.0–100.0)
Platelets: 272 10*3/uL (ref 150–440)
RBC: 4.49 MIL/uL (ref 3.80–5.20)
RDW: 15.7 % — ABNORMAL HIGH (ref 11.5–14.5)
WBC: 14.6 10*3/uL — AB (ref 3.6–11.0)

## 2017-07-29 LAB — GLUCOSE, CAPILLARY
GLUCOSE-CAPILLARY: 118 mg/dL — AB (ref 65–99)
GLUCOSE-CAPILLARY: 121 mg/dL — AB (ref 65–99)
GLUCOSE-CAPILLARY: 140 mg/dL — AB (ref 65–99)
GLUCOSE-CAPILLARY: 158 mg/dL — AB (ref 65–99)
GLUCOSE-CAPILLARY: 172 mg/dL — AB (ref 65–99)
GLUCOSE-CAPILLARY: 181 mg/dL — AB (ref 65–99)
GLUCOSE-CAPILLARY: 191 mg/dL — AB (ref 65–99)
GLUCOSE-CAPILLARY: 294 mg/dL — AB (ref 65–99)
Glucose-Capillary: 170 mg/dL — ABNORMAL HIGH (ref 65–99)
Glucose-Capillary: 123 mg/dL — ABNORMAL HIGH (ref 65–99)
Glucose-Capillary: 122 mg/dL — ABNORMAL HIGH (ref 65–99)
Glucose-Capillary: 222 mg/dL — ABNORMAL HIGH (ref 65–99)
Glucose-Capillary: 236 mg/dL — ABNORMAL HIGH (ref 65–99)
Glucose-Capillary: 336 mg/dL — ABNORMAL HIGH (ref 65–99)

## 2017-07-29 LAB — HEMOGLOBIN A1C
Hgb A1c MFr Bld: 12.3 % — ABNORMAL HIGH (ref 4.8–5.6)
MEAN PLASMA GLUCOSE: 306 mg/dL

## 2017-07-29 LAB — BASIC METABOLIC PANEL
Anion gap: 9 (ref 5–15)
BUN: 18 mg/dL (ref 6–20)
CALCIUM: 10 mg/dL (ref 8.9–10.3)
CO2: 27 mmol/L (ref 22–32)
Chloride: 101 mmol/L (ref 101–111)
Creatinine, Ser: 0.8 mg/dL (ref 0.44–1.00)
Glucose, Bld: 198 mg/dL — ABNORMAL HIGH (ref 65–99)
Potassium: 3.2 mmol/L — ABNORMAL LOW (ref 3.5–5.1)
SODIUM: 137 mmol/L (ref 135–145)

## 2017-07-29 MED ORDER — INSULIN GLARGINE 100 UNIT/ML ~~LOC~~ SOLN
15.0000 [IU] | Freq: Every day | SUBCUTANEOUS | Status: DC
  Administered 2017-07-29: 15 [IU] via SUBCUTANEOUS
  Filled 2017-07-29 (×2): qty 0.15

## 2017-07-29 MED ORDER — INSULIN ASPART 100 UNIT/ML ~~LOC~~ SOLN
0.0000 [IU] | Freq: Three times a day (TID) | SUBCUTANEOUS | Status: DC
  Administered 2017-07-29 – 2017-07-30 (×3): 3 [IU] via SUBCUTANEOUS
  Administered 2017-07-30: 09:00:00 9 [IU] via SUBCUTANEOUS
  Administered 2017-07-30: 3 [IU] via SUBCUTANEOUS
  Administered 2017-07-31: 7 [IU] via SUBCUTANEOUS
  Administered 2017-07-31: 2 [IU] via SUBCUTANEOUS
  Filled 2017-07-29 (×6): qty 1

## 2017-07-29 MED ORDER — POTASSIUM CHLORIDE CRYS ER 20 MEQ PO TBCR
40.0000 meq | EXTENDED_RELEASE_TABLET | Freq: Once | ORAL | Status: AC
  Administered 2017-07-29: 40 meq via ORAL
  Filled 2017-07-29: qty 2

## 2017-07-29 MED ORDER — ENOXAPARIN SODIUM 40 MG/0.4ML ~~LOC~~ SOLN
40.0000 mg | SUBCUTANEOUS | Status: DC
  Administered 2017-07-29 – 2017-07-30 (×2): 40 mg via SUBCUTANEOUS
  Filled 2017-07-29 (×2): qty 0.4

## 2017-07-29 MED ORDER — PHENOL 1.4 % MT LIQD
1.0000 | OROMUCOSAL | Status: DC | PRN
  Filled 2017-07-29 (×2): qty 177

## 2017-07-29 MED ORDER — INSULIN STARTER KIT- PEN NEEDLES (ENGLISH)
1.0000 | Freq: Once | Status: AC
  Administered 2017-07-29: 1
  Filled 2017-07-29: qty 1

## 2017-07-29 MED ORDER — INSULIN ASPART 100 UNIT/ML ~~LOC~~ SOLN
0.0000 [IU] | Freq: Every day | SUBCUTANEOUS | Status: DC
  Administered 2017-07-29: 4 [IU] via SUBCUTANEOUS
  Administered 2017-07-30: 22:00:00 2 [IU] via SUBCUTANEOUS
  Filled 2017-07-29 (×2): qty 1

## 2017-07-29 MED ORDER — LIVING WELL WITH DIABETES BOOK
Freq: Once | Status: AC
Start: 2017-07-29 — End: 2017-07-29
  Administered 2017-07-29: 14:00:00
  Filled 2017-07-29: qty 1

## 2017-07-29 MED ORDER — DIPHENHYDRAMINE HCL 25 MG PO CAPS
25.0000 mg | ORAL_CAPSULE | Freq: Once | ORAL | Status: AC
  Administered 2017-07-29: 25 mg via ORAL
  Filled 2017-07-29: qty 1

## 2017-07-29 NOTE — Evaluation (Signed)
Physical Therapy Evaluation Patient Details Name: Lynn Potter MRN: [id number] DOB: 1945-12-30 Today's Date: 07/29/2017   History of Present Illness  Pt is a 71 y.o.femalewith a known history of A fib, Asthma, Hypertension, Migraines. Had some nausea and feeling weak for last 2 days. Noted to be acting confused today by her friends there. In ER noted to have Blood sugar 1200, without DKA.  Assessment includes: Hyperglycemia, HTN, hyponatremia, and hyperkalemia.    Clinical Impression  Pt presents with deficits in strength, transfers, mobility, gait, balance, and activity tolerance.  Pt required mod A and significant time and effort for all bed mobility tasks.  Pt able to stand from elevated EOB with mod A and cues for proper sequencing.  Pt only able to amb 5' at EOB with heavy use of BUEs on RW with short B step length and mildly tremulous BLEs.  Vital signs WNL throughout session with no adverse symptoms reported by pt other than feeling weak and fatigued.  Pt will benefit from PT services in a SNF setting upon discharge to safely address above deficits for decreased caregiver assistance and eventual return to PLOF.      Follow Up Recommendations SNF    Equipment Recommendations  Rolling walker with 5" wheels;Other (comment) (TBD at next venue of care if pt discharges to SNF)    Recommendations for Other Services       Precautions / Restrictions Precautions Precautions: Fall Restrictions Weight Bearing Restrictions: No      Mobility  Bed Mobility Overal bed mobility: Needs Assistance Bed Mobility: Supine to Sit;Sit to Supine     Supine to sit: Mod assist     General bed mobility comments: Assist for full upright sitting and BLEs out of bed during sup to sit and for BLEs into bed during sit to sup.  Transfers Overall transfer level: Needs assistance Equipment used: Rolling walker (2 wheeled) Transfers: Sit to/from Stand Sit to Stand: Mod assist         General  transfer comment: Verbal cues needed for hand placement and general sequencing.  Ambulation/Gait Ambulation/Gait assistance: Min guard Ambulation Distance (Feet): 5 Feet Assistive device: Rolling walker (2 wheeled) Gait Pattern/deviations: Step-to pattern;Trunk flexed;Decreased step length - right;Decreased step length - left   Gait velocity interpretation: <1.8 ft/sec, indicative of risk for recurrent falls General Gait Details: Heavy use of BUEs on RW with short B step length and mildly tremulous BLEs.  Stairs            Wheelchair Mobility    Modified Rankin (Stroke Patients Only)       Balance Overall balance assessment: Needs assistance Sitting-balance support: Feet unsupported;Bilateral upper extremity supported Sitting balance-Leahy Scale: Good     Standing balance support: Bilateral upper extremity supported;During functional activity Standing balance-Leahy Scale: Fair                               Pertinent Vitals/Pain Pain Assessment: No/denies pain    Home Living Family/patient expects to be discharged to:: Private residence Living Arrangements: Alone Available Help at Discharge: Family;Available 24 hours/day (Daughter can assist 24/7 as needed) Type of Home: Apartment Home Access: Level entry     Home Layout: One level Home Equipment: Cane - single point;Walker - 4 wheels      Prior Function Level of Independence: Independent with assistive device(s)         Comments: Mod I with amb SPC or rollator  in the home and with rollator in community, no fall history, Ind with ADLs     Hand Dominance   Dominant Hand: Right    Extremity/Trunk Assessment   Upper Extremity Assessment Upper Extremity Assessment: Generalized weakness    Lower Extremity Assessment Lower Extremity Assessment: Generalized weakness       Communication   Communication: No difficulties  Cognition Arousal/Alertness: Lethargic Behavior During Therapy: WFL  for tasks assessed/performed Overall Cognitive Status: Within Functional Limits for tasks assessed                                 General Comments: Pt somewhat lethargic initially but improved during session      General Comments      Exercises Total Joint Exercises Ankle Circles/Pumps: AROM;Both;5 reps;10 reps Quad Sets: Strengthening;Both;10 reps Gluteal Sets: Strengthening;Both;10 reps Hip ABduction/ADduction: AAROM;Both;10 reps Straight Leg Raises: AAROM;Both;10 reps Long Arc Quad: AROM;Both;5 reps;10 reps Knee Flexion: AROM;Both;5 reps;10 reps Marching in Standing: AROM;Both;5 reps Other Exercises Other Exercises: HEP education/review for BLE APs, GS, and QS x 10 each 5-6x/day   Assessment/Plan    PT Assessment Patient needs continued PT services  PT Problem List Decreased strength;Decreased activity tolerance;Decreased balance;Decreased knowledge of use of DME;Decreased mobility       PT Treatment Interventions DME instruction;Gait training;Functional mobility training;Neuromuscular re-education;Balance training;Therapeutic exercise;Therapeutic activities;Patient/family education    PT Goals (Current goals can be found in the Care Plan section)  Acute Rehab PT Goals Patient Stated Goal: To walk better PT Goal Formulation: With patient Time For Goal Achievement: 08/11/17 Potential to Achieve Goals: Good    Frequency Min 2X/week   Barriers to discharge Inaccessible home environment      Co-evaluation               AM-PAC PT "6 Clicks" Daily Activity  Outcome Measure Difficulty turning over in bed (including adjusting bedclothes, sheets and blankets)?: Unable Difficulty moving from lying on back to sitting on the side of the bed? : Unable Difficulty sitting down on and standing up from a chair with arms (e.g., wheelchair, bedside commode, etc,.)?: Unable Help needed moving to and from a bed to chair (including a wheelchair)?: A Little Help  needed walking in hospital room?: A Lot Help needed climbing 3-5 steps with a railing? : Total 6 Click Score: 9    End of Session Equipment Utilized During Treatment: Gait belt Activity Tolerance: Patient limited by fatigue Patient left: in bed;with bed alarm set;with family/visitor present;with call bell/phone within reach;with nursing/sitter in room Nurse Communication: Mobility status PT Visit Diagnosis: Difficulty in walking, not elsewhere classified (R26.2);Muscle weakness (generalized) (M62.81)    Time: 4008-6761 PT Time Calculation (min) (ACUTE ONLY): 29 min   Charges:   PT Evaluation $PT Eval Low Complexity: 1 Low PT Treatments $Therapeutic Exercise: 8-22 mins   PT G Codes:   PT G-Codes **NOT FOR INPATIENT CLASS** Functional Assessment Tool Used: AM-PAC 6 Clicks Basic Mobility Functional Limitation: Mobility: Walking and moving around Mobility: Walking and Moving Around Current Status (P5093): At least 60 percent but less than 80 percent impaired, limited or restricted Mobility: Walking and Moving Around Goal Status [phone]): At least 20 percent but less than 40 percent impaired, limited or restricted    D. Royetta Asal PT, DPT 07/29/17, 12:55 PM

## 2017-07-29 NOTE — Progress Notes (Addendum)
Inpatient Diabetes Program Recommendations  AACE/ADA: New Consensus Statement on Inpatient Glycemic Control (2015)  Target Ranges:  Prepandial:   less than 140 mg/dL      Peak postprandial:   less than 180 mg/dL (1-2 hours)      Critically ill patients:  140 - 180 mg/dL  Results for LAJEAN, BOESE (MRN [id number]) as of 07/29/2017 08:44  Ref. Range 07/29/2017 00:32 07/29/2017 01:19 07/29/2017 02:18 07/29/2017 03:36 07/29/2017 04:29 07/29/2017 05:39 07/29/2017 06:50 07/29/2017 07:52  Glucose-Capillary Latest Ref Range: 65 - 99 mg/dL 191 (H) 172 (H) 170 (H) 181 (H) 140 (H) 118 (H) 123 (H) 122 (H)   Results for UVA, RUNKEL (MRN [id number]) as of 07/29/2017 08:44  Ref. Range 03/16/2017 16:16 06/09/2017 18:37 07/28/2017 15:34 07/29/2017 01:20  Glucose Latest Ref Range: 65 - 99 mg/dL 115 (H) 212 (H) 1,186 (HH) 198 (H)  Hemoglobin A1C Latest Ref Range: 4.8 - 5.6 %   12.3 (H)     Review of Glycemic Control  Diabetes history: Pre-diabetes Outpatient Diabetes medications: None Current orders for Inpatient glycemic control: IV insulin; transitioning to Lantus 15 units daily, Novolog 0-9 units TID with meals, Novolog 0-5 units QHS  Inpatient Diabetes Program Recommendations: HgbA1C: A1C 12.3% on 07/28/17 indicating an average glucose of 306 mg/dl over the past 2-3 months.   NOTE: In reviewing chart, noted patient has a history of pre-diabetes. Noted in office visit notes with Dr. Amalia Hailey that patient has received 3 steroid injections in left foot over past 4 months (received 03/10/17, 04/07/17, and 06/12/17). Will order Living Well with Diabetes book, RD consult, patient education by bedside RNs, and insulin starter kit. Will plan to talk with patient today.  Addendum 07/29/17@14 :00-Spoke with patient, patient's daughter, and patient's sister (with patient permission) about new diabetes diagnosis. During conversation patient was very drowsy and kept closing eyes and drifting off to sleep but awakened easily to  her name. Patient reports that she has a history of borderline DM and that her PCP checks her finger stick glucose and blood work regularly every 3-4 months. Noted in chart that lab glucose was 115 mg/dl on 03/16/2017. Patient reports that the last time her fingerstick was checked in the PCP office it was okay as PCP did not tell her otherwise. Patient does not know what an A1C is but has heard of it. Inquired about steroid injections in her left foot. Patient states that she got some shots in her ankle but she is not sure what the shots were. Informed patient that the shots were a steroid injection. Discussed how steroids can impact glycemic control and increase insulin resistance.  Discussed A1C results (12.3% on 07/28/17) and explained what an A1C is.  Briefly discussed basic pathophysiology of DM Type 2 and importance of good CBG control to prevent long-term and short-term complications. Patient reports that she has never checked her glucose at home and she does not have any testing supplies or glucometer at home. Reviewed glucose and A1C. Reviewed signs and symptoms of hyperglycemia and patient confirms increased tiredness and excessive thirst since this past weekend. Provided patient with Living Well with Diabetes booklet and encouraged patient to read through entire book. Discussed possibility of using insulin as an outpatient and patient reports that she does not want to take shots, she prefers to take pills for DM control. Explained to patient with her initial glucose of 1186 mg/dl and her A1C 12.3% she may need insulin and perhaps with lifestyle modifications her doctor may at  some point try her on oral DM medications. Patient again stated she does not want to take a shot and she doesn't think she can stick herself with a needle. Informed patient that nursing staff will be working with her to teach her more about insulin and how to self-inject insulin. Asked patient to read over Living Well with Diabetes  book and the insulin starter kit and informed that the Diabetes Coordinator at [facility] will follow up with her tomorrow to discuss DM control and insulin further.  Patient verbalized understanding of information discussed however she was not able to answer questions during teach back. Patient will require continuous reinforcement on DM and insulin education. Patient states that she has no further questions at this time related to diabetes.   RNs to provide ongoing basic DM education at bedside with this patient and engage patient to actively check blood glucose and administer insulin injections.   Thanks, Barnie Alderman, RN, MSN, CDE Diabetes Coordinator Inpatient Diabetes Program [phone] (Team Pager from 8am to 5pm)

## 2017-07-29 NOTE — Progress Notes (Signed)
[facility] [place] NAME: Lynn Potter    MR#:  [id number]  DATE OF BIRTH:  24-Sep-1946  SUBJECTIVE:  CHIEF COMPLAINT:   Chief Complaint  Patient presents with  . Abdominal Pain   -No prior history of diabetes.  Brought in for confusion and noted to have sugars in 1200s. -On insulin drip this morning, sugars less than 200.  Confusion is much improved.  REVIEW OF SYSTEMS:  Review of Systems  Constitutional: Positive for malaise/fatigue. Negative for chills and fever.  HENT: Negative for congestion, ear discharge, hearing loss and nosebleeds.   Eyes: Negative for blurred vision and double vision.  Respiratory: Negative for cough, shortness of breath and wheezing.   Cardiovascular: Negative for chest pain and palpitations.  Gastrointestinal: Negative for abdominal pain, constipation, diarrhea, nausea and vomiting.  Genitourinary: Positive for frequency. Negative for dysuria.  Musculoskeletal: Positive for myalgias.  Neurological: Negative for dizziness, sensory change, speech change, focal weakness, seizures and headaches.  Psychiatric/Behavioral:       Confusion    DRUG ALLERGIES:  No Known Allergies  VITALS:  Blood pressure 120/76, pulse (!) 55, temperature 97.8 F (36.6 C), temperature source Oral, resp. rate 17, height 5\' 5"  (1.651 m), weight 92.1 kg (203 lb), SpO2 100 %.  PHYSICAL EXAMINATION:  Physical Exam  GENERAL:  71 y.o.-year-old patient lying in the bed with no acute distress.  EYES: Pupils equal, round, reactive to light and accommodation. No scleral icterus. Extraocular muscles intact.  HEENT: Head atraumatic, normocephalic. Oropharynx and nasopharynx clear.  NECK:  Supple, no jugular venous distention. No thyroid enlargement, no tenderness.  LUNGS: Normal breath sounds bilaterally, no  rales,rhonchi or crepitation. No use of accessory muscles of respiration.  scattered wheezing all over the lung  fields CARDIOVASCULAR: S1, S2 normal. No murmurs, rubs, or gallops.  ABDOMEN: Soft, nontender, nondistended. Bowel sounds present. No organomegaly or mass.  EXTREMITIES: No pedal edema, cyanosis, or clubbing.  NEUROLOGIC: Cranial nerves II through XII are intact. Muscle strength 5/5 in all extremities. Sensation intact. Gait not checked.  Global weakness present PSYCHIATRIC: The patient is alert and oriented x 3.  Some disorientation and confusion noted, but easily reoriented SKIN: No obvious rash, lesion, or ulcer.    LABORATORY PANEL:   CBC  Recent Labs Lab 07/29/17 0120  WBC 14.6*  HGB 11.5*  HCT 35.5  PLT 272   ------------------------------------------------------------------------------------------------------------------  Chemistries   Recent Labs Lab 07/28/17 1534 07/29/17 0120  NA 118* 137  K 5.2* 3.2*  CL 77* 101  CO2 26 27  GLUCOSE 1,186* 198*  BUN 25* 18  CREATININE 1.39* 0.80  CALCIUM 10.3 10.0  AST 24  --   ALT 14  --   ALKPHOS 104  --   BILITOT 1.3*  --    ------------------------------------------------------------------------------------------------------------------  Cardiac Enzymes  Recent Labs Lab 07/28/17 1534  TROPONINI <0.03   ------------------------------------------------------------------------------------------------------------------  RADIOLOGY:  Ct Head Wo Contrast  Result Date: 07/28/2017 CLINICAL DATA:  Altered level of consciousness. EXAM: CT HEAD WITHOUT CONTRAST TECHNIQUE: Contiguous axial images were obtained from the base of the skull through the vertex without intravenous contrast. COMPARISON:  CT scan of June 09, 2017. FINDINGS: Brain: Mild diffuse cortical atrophy is noted. Mild chronic ischemic white matter disease is noted. No mass effect or midline shift is noted. Ventricular size is within normal limits. There is no evidence of mass lesion, hemorrhage or acute infarction. Vascular: No hyperdense vessel or  unexpected calcification. Skull:  Normal. Negative for fracture or focal lesion. Sinuses/Orbits: No acute finding. Other: None. IMPRESSION: Mild diffuse cortical atrophy. Mild chronic ischemic white matter disease. No acute intracranial abnormality seen. Electronically Signed   By: Marijo Conception, M.D.   On: 07/28/2017 16:11   Dg Chest Port 1 View  Result Date: 07/28/2017 CLINICAL DATA:  Hyperglycemia. Lethargy. Generalized weakness. Recent treatment for urinary tract infection. EXAM: PORTABLE CHEST 1 VIEW COMPARISON:  Screening CT chest 11/20/2016, 11/05/2015 and earlier. Chest x-rays 09/30/2016, 07/06/2007. FINDINGS: Cardiac silhouette mildly enlarged, unchanged. Thoracic aorta mildly tortuous, unchanged. Hilar and mediastinal contours otherwise unremarkable. Lungs clear. Bronchovascular markings normal. Pulmonary vascularity normal. No visible pleural effusions. No pneumothorax. Degenerative changes involving the shoulder joints as noted previously. IMPRESSION: Stable cardiomegaly.  No acute cardiopulmonary disease. Electronically Signed   By: Evangeline Dakin M.D.   On: 07/28/2017 16:14    EKG:   Orders placed or performed during the hospital encounter of 07/28/17  . EKG 12-Lead  . EKG 12-Lead  . ED EKG  . ED EKG  . EKG 12-Lead  . EKG 12-Lead    ASSESSMENT AND PLAN:   71 year old female with past medical history significant for hypertension, migraines, obesity, sleep apnea, history of atrial fibrillation was brought in secondary to confusion.  1.  New onset diabetes with hyperglycemia-sugars were greater than 1000 on admission -Started on insulin drip.  No known prior diabetes history. -Noted to have increased frequency of urination and was being treated for UTI -A1c is 12.8.  Since anion gap is closed, will start on subcutaneous Lantus this morning at 15 units and adjust the dose accordingly -Started on sliding scale insulin.  Discontinue insulin drip and started on carb controlled  diet -Dietitian consult and diabetes coordinator consult. -Outpatient endocrinology follow-up will be set up  2.  Hypertension-on benazepril and hydrochlorothiazide, and Norvasc  3.  Hyponatremia and hypokalemia-hyponatremia is pseudohyponatremia from elevated sugars, improved with fluids and sugar control. -Monitor, if  persistently low-discontinue hydrochlorothiazide -We will hypokalemia is being replaced  4. 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  Continue Lyrica  5. Glaucoma-continue eyedrops  6.  DVT prophylaxis-subcutaneous heparin-changed to Lovenox   Physical Therapy consult    All the records are reviewed and case discussed with Care Management/Social Workerr. Management plans discussed with the patient, family and they are in agreement.  CODE STATUS: Full code  TOTAL TIME TAKING CARE OF THIS PATIENT: 42 minutes.   POSSIBLE D/C IN 1-2 DAYS, DEPENDING ON CLINICAL CONDITION.   Gladstone Lighter M.D on 07/29/2017 at 8:36 AM  Between 7am to 6pm - Pager - [phone]  After 6pm go to [email or web address] - password [email or web address] [facility]  Office  [phone]  CC: Primary care physician; Remi Haggard, FNP

## 2017-07-29 NOTE — NC FL2 (Signed)
[place] LEVEL OF CARE SCREENING TOOL     IDENTIFICATION  Patient Name: NDIA SAMPATH Birthdate: Oct 01, 1946 Sex: female Admission Date (Current Location): 07/28/2017  [facility] and [place] Number:  Selena Lesser  ([id number]) Facility and Address:  [facility], [address] [place], [place] [address]      Provider Number: [id number]  Attending Physician Name and Address:  Gladstone Lighter, MD  Relative Name and Phone Number:       Current Level of Care: Hospital Recommended Level of Care: [place] Prior Approval Number:    Date Approved/Denied:   PASRR Number:  [id number])  Discharge Plan: SNF    Current Diagnoses: Patient Active Problem List   Diagnosis Date Noted  . Hyperglycemia 07/28/2017  . Vitamin D insufficiency 03/25/2017  . Low back pain (primary )(left greater than right) 03/17/2017  . Chronic pain syndrome 03/16/2017  . Encounter for long-term opiate analgesic use 03/16/2017  . Long term current use of opiate analgesic 03/16/2017  . Opiate use 03/16/2017  . 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 Sacroiliac joint pain 03/16/2017  . Sepsis ([place]) 09/30/2016  . Primary osteoarthritis of right shoulder 10/17/2015  . Personal history of tobacco use, presenting hazards to health 06/18/2015  . Osteoarthritis of shoulder region 03/10/2014  . Rotator cuff tendinitis 03/10/2014  . S/P rotator cuff repair 03/10/2014    Orientation RESPIRATION BLADDER Height & Weight     Self, Time, Situation, Place  Normal Continent Weight: 203 lb (92.1 kg) Height:  5\' 5"  (165.1 cm)  BEHAVIORAL SYMPTOMS/MOOD NEUROLOGICAL BOWEL NUTRITION STATUS      Continent Diet (Diet: Heart Healthy/ Care Modified. )  AMBULATORY STATUS COMMUNICATION OF NEEDS Skin   Extensive Assist Verbally Normal                       Personal Care Assistance Level of Assistance  Bathing, Feeding, Dressing Bathing Assistance:  Limited assistance Feeding assistance: Independent Dressing Assistance: Limited assistance     Functional Limitations Info  Sight, Hearing, Speech Sight Info: Adequate Hearing Info: Adequate Speech Info: Adequate    SPECIAL CARE FACTORS FREQUENCY  PT (By licensed PT), OT (By licensed OT)     PT Frequency:  (5) OT Frequency:  (5)            Contractures      Additional Factors Info  Code Status, Allergies Code Status Info:  (Full Code. ) Allergies Info:  (No Known Allergies. )           Current Medications (07/29/2017):  This is the current hospital active medication list Current Facility-Administered Medications  Medication Dose Route Frequency Provider Last Rate Last Dose  . 0.9 %  sodium chloride infusion   Intravenous Continuous Gladstone Lighter, MD 60 mL/hr at 07/29/17 0831    . amLODipine (NORVASC) tablet 10 mg  10 mg Oral Daily Vaughan Basta, MD   10 mg at 07/29/17 1011  . aspirin EC tablet 81 mg  81 mg Oral Daily Lenis Noon, RPH   81 mg at 07/29/17 1007  . benazepril (LOTENSIN) tablet 20 mg  20 mg Oral Daily Larene Beach, RPH   20 mg at 07/29/17 1011   And  . hydrochlorothiazide (HYDRODIURIL) tablet 25 mg  25 mg Oral Daily Larene Beach, RPH   25 mg at 07/29/17 1007  . budesonide (PULMICORT) nebulizer solution 0.25 mg  0.25 mg Nebulization BID Larene Beach, [facility]  0.25 mg at 07/29/17 0754  . docusate sodium (COLACE) capsule 100 mg  100 mg Oral BID PRN Vaughan Basta, MD      . enoxaparin (LOVENOX) injection 40 mg  40 mg Subcutaneous Q24H Gladstone Lighter, MD      . ferrous sulfate EC tablet 325 mg  325 mg Oral BID Vaughan Basta, MD   325 mg at 07/29/17 1007  . insulin aspart (novoLOG) injection 0-5 Units  0-5 Units Subcutaneous QHS Gladstone Lighter, MD      . insulin aspart (novoLOG) injection 0-9 Units  0-9 Units Subcutaneous TID WC Gladstone Lighter, MD   3 Units at 07/29/17 1127  . insulin glargine (LANTUS)  injection 15 Units  15 Units Subcutaneous Daily Gladstone Lighter, MD   15 Units at 07/29/17 0907  . ipratropium-albuterol (DUONEB) 0.5-2.5 (3) MG/3ML nebulizer solution 3 mL  3 mL Nebulization Q6H PRN Jeneen Rinks, Teldrin D, RPH      . latanoprost (XALATAN) 0.005 % ophthalmic solution 1 drop  1 drop Both Eyes QHS Vaughan Basta, MD   1 drop at 07/28/17 2131  . pantoprazole (PROTONIX) EC tablet 40 mg  40 mg Oral Daily Vaughan Basta, MD   40 mg at 07/29/17 1007  . phenol (CHLORASEPTIC) mouth spray 1 spray  1 spray Mouth/Throat PRN Gladstone Lighter, MD      . pregabalin (LYRICA) capsule 150 mg  150 mg Oral QHS Vaughan Basta, MD   150 mg at 07/28/17 2130  . Vitamin D (Ergocalciferol) (DRISDOL) capsule 50,000 Units  50,000 Units Oral Q7 days Vaughan Basta, MD   50,000 Units at 07/28/17 2010     Discharge Medications: Please see discharge summary for a list of discharge medications.  Relevant Imaging Results:  Relevant Lab Results:   Additional Information  (SSN: [id number])  Aniko Finnigan, Veronia Beets, LCSW

## 2017-07-29 NOTE — Progress Notes (Signed)
Pt transferred to floor from ICU. She is alert and oriented and resting in bed with family at bedside. VSS: BP 96/60 (BP Location: Left Arm)   Pulse 85   Temp 98.2 F (36.8 C) (Oral)   Resp 16   Ht 5\' 5"  (1.651 m)   Wt 203 lb (92.1 kg)   SpO2 100%   BMI 33.78 kg/m . Pt is asking for ice cream, I educated her that she can not have ice cream at this time because her CBGs have been so elevated. She stated she understood . Diet sprite provided per her request.

## 2017-07-29 NOTE — Plan of Care (Signed)
Problem: Food- and Nutrition-Related Knowledge Deficit (NB-1.1) Goal: Nutrition education Formal process to instruct or train a patient/client in a skill or to impart knowledge to help patients/clients voluntarily manage or modify food choices and eating behavior to maintain or improve health. Outcome: Adequate for Discharge  RD consulted for nutrition education regarding diabetes.   Lab Results  Component Value Date   HGBA1C 12.3 (H) 07/28/2017   Met with patient and family members at bedside. Patient reports she has had pre-diabetes for years now, but has not had any specific nutrition counseling or dietary advice except to cut back on her bread. She has a good appetite. She usually eats 1-2 meals per day. She may have chicken with cabbage and potatoes or go out to eat. She snacks during the day and drinks Pepsi "all day long." Patient was eating breakfast at time of assessment (orange juice, muffin, eggs, oatmeal, milk).  RD provided "Carbohydrate Counting for People with Diabetes" handout from the Academy of Nutrition and Dietetics. Discussed different food groups and their effects on blood sugar, emphasizing carbohydrate-containing foods. Provided list of carbohydrates and recommended serving sizes of common foods.  Discussed importance of controlled and consistent carbohydrate intake throughout the day. Provided examples of ways to balance meals/snacks and encouraged intake of high-fiber, whole grain complex carbohydrates. Teach back method used.  Expect poor to fair compliance based on responses to teach-back. Patient is amenable to go to outpatient RD for nutrition counseling to promote behavior change.  Body mass index is 33.78 kg/m. Pt meets criteria for Obesity Class I based on current BMI.  Current diet order is Heart Healthy/Carbohydrate Modified. Patient still eating her first meal but has consumed at least 75% so far. Labs and medications reviewed. No further nutrition  interventions warranted at this time. RD contact information provided. If additional nutrition issues arise, please re-consult RD.  Willey Blade, MS, [place], LDN Office: [phone] Pager: [phone] After Hours/Weekend Pager: [phone]

## 2017-07-29 NOTE — Care Management (Signed)
Patient admitted from home with non ketotic hyperglycemia.  Admitted to icu stepdown due to insulin drip. Patient diagnosed with new onset diabetes with blood sugar  1000.  Hemoglobin A1C 12.3.  Physical therapy has recommended skilled nursing facility placement.  If patient is able to discharge directly home- will certainly benefit from home health services. anticipating patient will discharge home on insulin injections. Spoke with patient and her daughter Demetrie. Aware of physical therapy recommendations and would consider if it is absolutely needed but would prefer to go home.  Patient does live alone.  Has access to a walker.  Discussed home health options and that it is not "continbuous" in nature.  No agency preference.  Discussed CM will need to find agency that are in network with [facility].  [facility] would not be able to accept

## 2017-07-30 ENCOUNTER — Inpatient Hospital Stay: Payer: Medicare Other

## 2017-07-30 LAB — GLUCOSE, CAPILLARY
GLUCOSE-CAPILLARY: 364 mg/dL — AB (ref 65–99)
GLUCOSE-CAPILLARY: 225 mg/dL — AB (ref 65–99)
Glucose-Capillary: 208 mg/dL — ABNORMAL HIGH (ref 65–99)
Glucose-Capillary: 228 mg/dL — ABNORMAL HIGH (ref 65–99)

## 2017-07-30 LAB — BASIC METABOLIC PANEL
ANION GAP: 7 (ref 5–15)
BUN: 13 mg/dL (ref 6–20)
CO2: 26 mmol/L (ref 22–32)
Calcium: 8.7 mg/dL — ABNORMAL LOW (ref 8.9–10.3)
Chloride: 100 mmol/L — ABNORMAL LOW (ref 101–111)
Creatinine, Ser: 0.85 mg/dL (ref 0.44–1.00)
GLUCOSE: 319 mg/dL — AB (ref 65–99)
POTASSIUM: 3.7 mmol/L (ref 3.5–5.1)
Sodium: 133 mmol/L — ABNORMAL LOW (ref 135–145)

## 2017-07-30 LAB — CBC
HEMATOCRIT: 34.7 % — AB (ref 35.0–47.0)
Hemoglobin: 11.5 g/dL — ABNORMAL LOW (ref 12.0–16.0)
MCH: 26 pg (ref 26.0–34.0)
MCHC: 33 g/dL (ref 32.0–36.0)
MCV: 78.8 fL — AB (ref 80.0–100.0)
PLATELETS: 194 10*3/uL (ref 150–440)
RBC: 4.41 MIL/uL (ref 3.80–5.20)
RDW: 15.7 % — ABNORMAL HIGH (ref 11.5–14.5)
WBC: 6.9 10*3/uL (ref 3.6–11.0)

## 2017-07-30 MED ORDER — INSULIN GLARGINE 100 UNIT/ML ~~LOC~~ SOLN
23.0000 [IU] | Freq: Every day | SUBCUTANEOUS | Status: DC
  Administered 2017-07-30 – 2017-07-31 (×2): 23 [IU] via SUBCUTANEOUS
  Filled 2017-07-30 (×3): qty 0.23

## 2017-07-30 MED ORDER — HYDROCHLOROTHIAZIDE 12.5 MG PO CAPS: 12.5000 mg | ORAL_CAPSULE | Freq: Every day | ORAL | Status: DC

## 2017-07-30 MED ORDER — FLUTICASONE PROPIONATE 50 MCG/ACT NA SUSP
2.0000 | Freq: Every day | NASAL | Status: DC
  Administered 2017-07-31: 2 via NASAL
  Filled 2017-07-30: qty 16

## 2017-07-30 MED ORDER — HYDROCHLOROTHIAZIDE 12.5 MG PO CAPS
12.5000 mg | ORAL_CAPSULE | Freq: Every day | ORAL | Status: DC
  Administered 2017-07-30 – 2017-07-31 (×2): 12.5 mg via ORAL
  Filled 2017-07-30 (×2): qty 1

## 2017-07-30 MED ORDER — BENAZEPRIL HCL 20 MG PO TABS
20.0000 mg | ORAL_TABLET | Freq: Every day | ORAL | Status: DC
  Administered 2017-07-30 – 2017-07-31 (×2): 20 mg via ORAL
  Filled 2017-07-30 (×2): qty 1

## 2017-07-30 MED ORDER — BENAZEPRIL HCL 20 MG PO TABS
20.0000 mg | ORAL_TABLET | Freq: Every day | ORAL | Status: DC
  Filled 2017-07-30: qty 1

## 2017-07-30 MED ORDER — METFORMIN HCL 500 MG PO TABS
1000.0000 mg | ORAL_TABLET | Freq: Two times a day (BID) | ORAL | Status: DC
  Administered 2017-07-30: 09:00:00 1000 mg via ORAL
  Filled 2017-07-30 (×2): qty 2

## 2017-07-30 MED ORDER — METFORMIN HCL 500 MG PO TABS
500.0000 mg | ORAL_TABLET | Freq: Two times a day (BID) | ORAL | Status: DC
  Administered 2017-07-30 – 2017-07-31 (×2): 500 mg via ORAL
  Filled 2017-07-30 (×3): qty 1

## 2017-07-30 NOTE — Progress Notes (Signed)
Inpatient Diabetes Program Recommendations  AACE/ADA: New Consensus Statement on Inpatient Glycemic Control (2015)  Target Ranges:  Prepandial:   less than 140 mg/dL      [facility] postprandial:   less than 180 mg/dL (1-2 hours)      Critically ill patients:  140 - 180 mg/dL   Lab Results  Component Value Date   GLUCAP 364 (H) 07/30/2017   HGBA1C 12.3 (H) 07/28/2017    Review of Glycemic Control  Results for Lynn Potter, Lynn Potter (MRN [id number]) as of 07/30/2017 08:13  Ref. Range 07/29/2017 11:06 07/29/2017 15:42 07/29/2017 16:27 07/29/2017 19:56 07/30/2017 07:36  Glucose-Capillary Latest Ref Range: 65 - 99 mg/dL 222 (H) 294 (H) 236 (H) 336 (H) 364 (H)    Diabetes history: New onset Type 2 diabetes Outpatient Diabetes medications: None Current orders for Inpatient glycemic control:  Lantus 15 units daily, Novolog 0-9 units TID with meals, Novolog 0-5 units QHS  Inpatient Diabetes Program Recommendations: HgbA1C: A1C 12.3% on 07/28/17 indicating an average glucose of 306 mg/dl over the past 2-3 months.    RNs to provide ongoing basic DM education at bedside with this patient and engage patient to actively check blood glucose and administer insulin injections.   Consider increasing Lantus to 23 units qam, add Novolog 4 units tid and increase Novolog correction to moderate correction scale.   Text paged Dr. Tressia Miners at 08:22am. Spoke to [place], RN, [place], [place], CDE Diabetes Coordinator Inpatient Diabetes Program  [phone] (Team Pager) [phone] ([place]) 07/30/2017 8:18 AM

## 2017-07-30 NOTE — Progress Notes (Signed)
Lynn Potter at [name] NAME: Lynn Potter    MR#:  [id number]  DATE OF BIRTH:  1946-03-20  SUBJECTIVE:  CHIEF COMPLAINT:   Chief Complaint  Patient presents with  . Abdominal Pain   - Complains of cough and nausea this morning. Started on metformin today -Sugars are still in the 300 range -Physical therapy recommended rehabilitation.  REVIEW OF SYSTEMS:  Review of Systems  Constitutional: Positive for malaise/fatigue. Negative for chills and fever.  HENT: Negative for congestion, ear discharge, hearing loss and nosebleeds.   Eyes: Negative for blurred vision and double vision.  Respiratory: Positive for cough. Negative for shortness of breath and wheezing.   Cardiovascular: Negative for chest pain, palpitations and leg swelling.  Gastrointestinal: Negative for abdominal pain, constipation, diarrhea, nausea and vomiting.  Genitourinary: Negative for dysuria and frequency.  Musculoskeletal: Positive for myalgias.  Neurological: Negative for dizziness, sensory change, speech change, focal weakness, seizures and headaches.  Psychiatric/Behavioral:       Confusion    DRUG ALLERGIES:  No Known Allergies  VITALS:  Blood pressure (!) 132/55, pulse 88, temperature 98.8 F (37.1 C), temperature source Oral, resp. rate 18, height 5\' 5"  (1.651 m), weight 92.1 kg (203 lb), SpO2 100 %.  PHYSICAL EXAMINATION:  Physical Exam  GENERAL:  71 y.o.-year-old patient lying in the bed with no acute distress.  EYES: Pupils equal, round, reactive to light and accommodation. No scleral icterus. Extraocular muscles intact.  HEENT: Head atraumatic, normocephalic. Oropharynx and nasopharynx clear.  NECK:  Supple, no jugular venous distention. No thyroid enlargement, no tenderness.  LUNGS: Normal breath sounds bilaterally, no  rales,rhonchi or crepitation. No use of accessory muscles of respiration.  Much improved wheezing today CARDIOVASCULAR: S1, S2 normal.  No murmurs, rubs, or gallops.  ABDOMEN: Soft, nontender, nondistended. Bowel sounds present. No organomegaly or mass.  EXTREMITIES: No pedal edema, cyanosis, or clubbing.  NEUROLOGIC: Cranial nerves II through XII are intact. Muscle strength 5/5 in all extremities. Sensation intact. Gait not checked.  Global weakness present PSYCHIATRIC: The patient is alert and oriented x 3.  Some disorientation and confusion noted, but easily reoriented SKIN: No obvious rash, lesion, or ulcer.    LABORATORY PANEL:   CBC  Recent Labs Lab 07/30/17 0409  WBC 6.9  HGB 11.5*  HCT 34.7*  PLT 194   ------------------------------------------------------------------------------------------------------------------  Chemistries   Recent Labs Lab 07/28/17 1534  07/30/17 0409  NA 118*  < > 133*  K 5.2*  < > 3.7  CL 77*  < > 100*  CO2 26  < > 26  GLUCOSE 1,186*  < > 319*  BUN 25*  < > 13  CREATININE 1.39*  < > 0.85  CALCIUM 10.3  < > 8.7*  AST 24  --   --   ALT 14  --   --   ALKPHOS 104  --   --   BILITOT 1.3*  --   --   < > = values in this interval not displayed. ------------------------------------------------------------------------------------------------------------------  Cardiac Enzymes  Recent Labs Lab 07/28/17 1534  TROPONINI <0.03   ------------------------------------------------------------------------------------------------------------------  RADIOLOGY:  Dg Chest 2 View  Result Date: 07/30/2017 CLINICAL DATA:  Cough today. EXAM: CHEST  2 VIEW COMPARISON:  CT chest 11/20/2016. Single-view of the chest 07/28/2017. FINDINGS: The lungs are clear. Heart size is upper normal. No pneumothorax or pleural fluid. No acute bony abnormality. IMPRESSION: No acute disease. Electronically Signed   By: Inge Rise  M.D.   On: 07/30/2017 13:59   Ct Head Wo Contrast  Result Date: 07/28/2017 CLINICAL DATA:  Altered level of consciousness. EXAM: CT HEAD WITHOUT CONTRAST TECHNIQUE:  Contiguous axial images were obtained from the base of the skull through the vertex without intravenous contrast. COMPARISON:  CT scan of June 09, 2017. FINDINGS: Brain: Mild diffuse cortical atrophy is noted. Mild chronic ischemic white matter disease is noted. No mass effect or midline shift is noted. Ventricular size is within normal limits. There is no evidence of mass lesion, hemorrhage or acute infarction. Vascular: No hyperdense vessel or unexpected calcification. Skull: Normal. Negative for fracture or focal lesion. Sinuses/Orbits: No acute finding. Other: None. IMPRESSION: Mild diffuse cortical atrophy. Mild chronic ischemic white matter disease. No acute intracranial abnormality seen. Electronically Signed   By: Marijo Conception, M.D.   On: 07/28/2017 16:11   Dg Chest Port 1 View  Result Date: 07/28/2017 CLINICAL DATA:  Hyperglycemia. Lethargy. Generalized weakness. Recent treatment for urinary tract infection. EXAM: PORTABLE CHEST 1 VIEW COMPARISON:  Screening CT chest 11/20/2016, 11/05/2015 and earlier. Chest x-rays 09/30/2016, 07/06/2007. FINDINGS: Cardiac silhouette mildly enlarged, unchanged. Thoracic aorta mildly tortuous, unchanged. Hilar and mediastinal contours otherwise unremarkable. Lungs clear. Bronchovascular markings normal. Pulmonary vascularity normal. No visible pleural effusions. No pneumothorax. Degenerative changes involving the shoulder joints as noted previously. IMPRESSION: Stable cardiomegaly.  No acute cardiopulmonary disease. Electronically Signed   By: Evangeline Dakin M.D.   On: 07/28/2017 16:14    EKG:   Orders placed or performed during the hospital encounter of 07/28/17  . EKG 12-Lead  . EKG 12-Lead  . ED EKG  . ED EKG  . EKG 12-Lead  . EKG 12-Lead    ASSESSMENT AND PLAN:   71 year old female with past medical history significant for hypertension, migraines, obesity, sleep apnea, history of atrial fibrillation was brought in secondary to  confusion.  1.  New onset diabetes with hyperglycemia-sugars were greater than 1000 on admission -No known prior diabetes history. Was in ICU receiving insulin drip. -A1c is 12.8.  Started on Lantus and adjusting the dose. -Also metformin twice a day -on sliding scale insulin. on carb controlled diet -Dietitian consult and diabetes coordinator consult. -Outpatient endocrinology follow-up will be set up -IV fluids discontinued due to starting cough. Chest x-ray is clear.  continue nebs  2.  Hypertension-on benazepril and hydrochlorothiazide and norvasc  3.  Hyponatremia and hypokalemia-hyponatremia is pseudohyponatremia from elevated sugars, improved with fluids and sugar control. -decreased hydrochlorothiazide dose -potassium replaced  4. 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  Continue Lyrica  5. Glaucoma-continue eyedrops  6.  DVT prophylaxis-subcutaneous  Lovenox   Physical Therapy consulted- recommended rehabilitation. -Social worker consulted    All the records are reviewed and case discussed with Care Management/Social Workerr. Management plans discussed with the patient, family and they are in agreement.  CODE STATUS: Full code  TOTAL TIME TAKING CARE OF THIS PATIENT: 38 minutes.   POSSIBLE D/C TOMORROW, DEPENDING ON CLINICAL CONDITION.   Gladstone Lighter M.D on 07/30/2017 at 2:28 PM  Between 7am to 6pm - Pager - [phone]  After 6pm go to [email or web address] - password [email or web address] [facility]  Office  [phone]  CC: Primary care physician; Remi Haggard, FNP

## 2017-07-30 NOTE — Clinical Social Work Note (Signed)
Clinical Social Work Assessment  Patient Details  Name: Lynn Potter MRN: [id number] Date of Birth: 1946-06-23  Date of referral:  07/30/17               Reason for consult:  Facility Placement, Discharge Planning                Permission sought to share information with:  Chartered certified accountant granted to share information::  Yes, Verbal Permission Granted  Name::      [place]::   [place]  Relationship::     Contact Information:     Housing/Transportation Living arrangements for the past 2 months:  [place] of Information:  Adult Children, Patient Patient Interpreter Needed:  None Criminal Activity/Legal Involvement Pertinent to Current Situation/Hospitalization:  No - Comment as needed Significant Relationships:  Adult Children Lives with:  Self Do you feel safe going back to the place where you live?  Yes Need for family participation in patient care:  Yes (Comment)  Care giving concerns:  Patient lives alone in [place].   Social Worker assessment / plan:  Holiday representative (CSW) received a SNF consult. PT is recommending SNF. Social work Theatre manager met with patient, daughter Lynn Potter [phone]), and friend Lynn Potter at the bedside. Patient was alert and oriented x4 but allowed daughter to speak on her behalf. Social work Theatre manager introduced self and explained the role of the [place]. Patient's daughter shared that patient has lived in [facility] (senior citizen home) for the past 9 years. Social work Theatre manager explained that PT is recommending SNF as well as the SNF process. Patient and her daughter verbalized understanding and are agreeable to bed search. Social work Theatre manager presented bed offers. Patient and her daughter prefer [facility]. FL2 completed and faxed out. CSW and Social work Theatre manager will continue to follow up and assist.  Broadus John, [facility] liaison, is aware of accepted bed offer.   Employment status:   Retired Nurse, adult PT Recommendations:  [place] / Referral to community resources:  [place]  Patient/Family's Response to care:  Patient and her daughter prefer [facility].  Patient/Family's Understanding of and Emotional Response to Diagnosis, Current Treatment, and Prognosis:  Patient and her daughter were pleasant and thanked social work Theatre manager for her assistance.  Emotional Assessment Appearance:  Appears stated age Attitude/Demeanor/Rapport:    Affect (typically observed):  Accepting, Calm, Pleasant Orientation:  Oriented to Self, Oriented to Place, Oriented to  Time, Oriented to Situation Alcohol / Substance use:  Not Applicable Psych involvement (Current and /or in the community):  No (Comment)  Discharge Needs  Concerns to be addressed:  Care Coordination, Discharge Planning Concerns Readmission within the last 30 days:  No Current discharge risk:  Dependent with Mobility Barriers to Discharge:  Continued Medical Work up   Smith Mince, Student-Social Work 07/30/2017, 10:08 AM

## 2017-07-30 NOTE — Clinical Social Work Placement (Signed)
   CLINICAL SOCIAL WORK PLACEMENT  NOTE  Date:  07/30/2017  Patient Details  Name: Lynn Potter MRN: [id number] Date of Birth: March 09, 1946  Clinical Social Work is seeking post-discharge placement for this patient at the [place] Level level of care (*CSW will initial, date and re-position this form in  chart as items are completed):  Yes   Patient/family provided with [facility] Work Department's list of facilities offering this level of care within the geographic area requested by the patient (or if unable, by the patient's family).  Yes   Patient/family informed of their freedom to choose among providers that offer the needed level of care, that participate in Medicare, Medicaid or managed care program needed by the patient, have an available bed and are willing to accept the patient.  Yes   Patient/family informed of [facility]'s ownership interest in [facility] and [facility], as well as of the fact that they are under no obligation to receive care at these facilities.  PASRR submitted to EDS on       PASRR number received on       Existing PASRR number confirmed on 07/29/17     FL2 transmitted to all facilities in geographic area requested by pt/family on 07/29/17     FL2 transmitted to all facilities within larger geographic area on       Patient informed that his/her managed care company has contracts with or will negotiate with certain facilities, including the following:        Yes   Patient/family informed of bed offers received.  Patient chooses bed at  ([facility] )     Physician recommends and patient chooses bed at      Patient to be transferred to   on  .  Patient to be transferred to facility by       Patient family notified on   of transfer.  Name of family member notified:        PHYSICIAN       Additional Comment:    _______________________________________________ Sunny Aguon, Veronia Beets, LCSW 07/30/2017, 10:07 AM

## 2017-07-30 NOTE — Plan of Care (Signed)
Problem: Education: Goal: Knowledge of [facility] Education information/materials will improve Outcome: Progressing VSS, free of falls during shift.  Refused purewick at beginning of shift, in agreement after multiple incontinent episodes overnight.  Agreed to placement at next incontinent episode.  Denies pain, no complaints overnight.  Expressed frustration with labile CBG, this RN provided reassurance and education re: changes in blood glucose levels.  Bed in low position, bed alarm on.  Call bell within reach, [place].

## 2017-07-30 NOTE — Progress Notes (Signed)
Physical Therapy Treatment Patient Details Name: Lynn Potter MRN: [id number] DOB: [birth year] Today's Date: 07/30/2017    History of Present Illness Pt is a 71 y.o.femalewith a known history of A fib, Asthma, Hypertension, Migraines. Had some nausea and feeling weak for last 2 days. Noted to be acting confused today by her friends there. In ER noted to have Blood sugar 1200, without DKA.  Assessment includes: Hyperglycemia, HTN, hyponatremia, and hyperkalemia.    PT Comments    Pt on [facility] with student nurse and agreeable to PT. PT with significant complaints of fatigue and weakness. Min A 1-2 with STS; poorly controlled descent to bed. Pt notes new tremor in R hand that nursing aware of; pt also complains of significant weakness and inability to hold arm up; upon testing, bilateral upper extremity lift equal with simultaneous inability to hold and sudden drop. Grip strength stronger on R as compared to right. Pt questioned regarding speech; pt notes speed is not normally this slow and mumbled and pt feels difficulty with word finding. Sitting edge of bed pt demonstrates right lateral lean with unawareness of lean; able to right when cued, but does not maintain long leaning back to the right. When asked to maintain, pt leans posterior or posterolateral to the right again with unawareness. Pt assisted back to bed; nursing contacted and updated with current symptoms/findings. Pt left with visitor and nursing student/nurse in the room. Nursing paging MD. Continue PT as appropriate.   Follow Up Recommendations  SNF     Equipment Recommendations  Rolling walker with 5" wheels;Other (comment)    Recommendations for Other Services       Precautions / Restrictions Precautions Precautions: Fall Restrictions Weight Bearing Restrictions: No    Mobility  Bed Mobility Overal bed mobility: Needs Assistance Bed Mobility: Sit to Supine       Sit to supine: Mod assist   General bed mobility  comments: Assist for trunk and LEs  Transfers Overall transfer level: Needs assistance Equipment used: None Transfers: Sit to/from Stand Sit to Stand: Mod assist;+2 safety/equipment         General transfer comment: Sit to stand from BSC/Bed. Poor control with descent to bed  Ambulation/Gait Ambulation/Gait assistance: Min assist Ambulation Distance (Feet): 2 Feet Assistive device: None Gait Pattern/deviations:  (2 sidesteps)         Stairs            Wheelchair Mobility    Modified Rankin (Stroke Patients Only)       Balance Overall balance assessment: Needs assistance Sitting-balance support: Feet supported;Bilateral upper extremity supported Sitting balance-Leahy Scale: Poor (Fair ) Sitting balance - Comments: Right lateral lean with unawarness of lean; able to correct with VC to do so, but does not maintain. Also noted some posterior lean with attempt to maintain neutral sitting.    Standing balance support: Bilateral upper extremity supported Standing balance-Leahy Scale: Poor Standing balance comment: with hand held support                            Cognition Arousal/Alertness: Awake/alert (But fatigued; reports lethargy) Behavior During Therapy: WFL for tasks assessed/performed Overall Cognitive Status: Within Functional Limits for tasks assessed                                 General Comments: Pt reports difficulty with word finding, and speech more mumbled/slurred  than normal. Also complains of extremely weak feeling UEs even more so than LEs      Exercises Other Exercises Other Exercises: Pt able to demonstrate BUE shoulder flexion simultaneous, but uable to hold with sudden drop B; equal LE ankle pump, march and FAQ, but weakened B    General Comments        Pertinent Vitals/Pain Pain Assessment: No/denies pain    Home Living                      Prior Function            PT Goals (current goals  can now be found in the care plan section) Progress towards PT goals: Not progressing toward goals - comment    Frequency    Min 2X/week      PT Plan Current plan remains appropriate    Co-evaluation              AM-PAC PT "6 Clicks" Daily Activity  Outcome Measure  Difficulty turning over in bed (including adjusting bedclothes, sheets and blankets)?: Unable Difficulty moving from lying on back to sitting on the side of the bed? : Unable Difficulty sitting down on and standing up from a chair with arms (e.g., wheelchair, bedside commode, etc,.)?: Unable Help needed moving to and from a bed to chair (including a wheelchair)?: Total Help needed walking in hospital room?: A Lot Help needed climbing 3-5 steps with a railing? : Total 6 Click Score: 7    End of Session Equipment Utilized During Treatment: Gait belt Activity Tolerance: Patient limited by fatigue;Other (comment) (weakness, balance) Patient left: in bed;with call bell/phone within reach;with nursing/sitter in room Nurse Communication: Other (comment) (sx's of decreased balance/speech/strength/word finding) PT Visit Diagnosis: Difficulty in walking, not elsewhere classified (R26.2);Muscle weakness (generalized) (M62.81)     Time: 8453-6468 PT Time Calculation (min) (ACUTE ONLY): 17 min  Charges:                       G CodesLarae Grooms, PTA 07/30/2017, 4:08 PM

## 2017-07-30 NOTE — Plan of Care (Signed)
Diabetes Coordinator will meet with family tomorrow am.  Lungs sounds were diminished and had expiratory wheezes. Fluids stopped. Chest xray was clear.  Nursing student has been teaching her how to self administer insulin.  We've also done diet/snacking education. Pt has been lethargic today, also has tremors.  PT is recommending SNF and SW is wking on bed placement.  Metformin was started today.

## 2017-07-30 NOTE — Progress Notes (Signed)
Inpatient Diabetes Program Recommendations  AACE/ADA: New Consensus Statement on Inpatient Glycemic Control (2015)  Target Ranges:  Prepandial:   less than 140 mg/dL      [facility] postprandial:   less than 180 mg/dL (1-2 hours)      Critically ill patients:  140 - 180 mg/dL   Lab Results  Component Value Date   GLUCAP 225 (H) 07/30/2017   HGBA1C 12.3 (H) 07/28/2017    Met with patient today- husband and daughter are not present and both will be in tomorrow morning- I will return at 8am to review insulin syringe and insulin pen with them.  Nursing student tells me the patient has been giving herself her own insulin today without any difficulty.    She is drowsy when I am visiting but rouses when spoken to.  Discussed the need for basal and bolus insulin- assured patient we will review doses of medication with her when she is discharged.  Encouraged student to review insulin pen with patient this afternoon.   Gentry Fitz, RN, BA, MHA, CDE Diabetes Coordinator Inpatient Diabetes Program  [phone] (Team Pager) [phone] ([place]) 07/30/2017 2:34 PM

## 2017-07-30 NOTE — Care Management (Signed)
Physical therapy has continued to recommend SNF. Developed som sx concerning for neurological event- word finding issues , upper extremity issues-head CT negative.

## 2017-07-30 NOTE — Progress Notes (Signed)
Medications administered by student RN [id number] with supervision of Clinical Instructor Surya Folden MSN, RN-BC or patient's assigned RN.   

## 2017-07-31 LAB — BASIC METABOLIC PANEL
Anion gap: 7 (ref 5–15)
BUN: 9 mg/dL (ref 6–20)
CALCIUM: 8.5 mg/dL — AB (ref 8.9–10.3)
CO2: 25 mmol/L (ref 22–32)
CREATININE: 0.79 mg/dL (ref 0.44–1.00)
Chloride: 102 mmol/L (ref 101–111)
Glucose, Bld: 196 mg/dL — ABNORMAL HIGH (ref 65–99)
Potassium: 3.2 mmol/L — ABNORMAL LOW (ref 3.5–5.1)
SODIUM: 134 mmol/L — AB (ref 135–145)

## 2017-07-31 LAB — GLUCOSE, CAPILLARY
GLUCOSE-CAPILLARY: 186 mg/dL — AB (ref 65–99)
GLUCOSE-CAPILLARY: 336 mg/dL — AB (ref 65–99)
GLUCOSE-CAPILLARY: 163 mg/dL — AB (ref 65–99)

## 2017-07-31 MED ORDER — LIVING WELL WITH DIABETES BOOK
Freq: Once | Status: AC
  Administered 2017-07-31: 09:00:00
  Filled 2017-07-31: qty 1

## 2017-07-31 MED ORDER — POTASSIUM CHLORIDE 20 MEQ PO PACK
20.0000 meq | PACK | Freq: Two times a day (BID) | ORAL | Status: DC
  Administered 2017-07-31: 20 meq via ORAL
  Filled 2017-07-31: qty 1

## 2017-07-31 MED ORDER — INSULIN ASPART 100 UNIT/ML ~~LOC~~ SOLN: 0.0000 [IU] | Freq: Three times a day (TID) | SUBCUTANEOUS | 11 refills | Status: DC

## 2017-07-31 MED ORDER — INSULIN ASPART 100 UNIT/ML ~~LOC~~ SOLN: 0.0000 [IU] | Freq: Every day | SUBCUTANEOUS | 11 refills | Status: DC

## 2017-07-31 MED ORDER — INSULIN ASPART 100 UNIT/ML ~~LOC~~ SOLN: 4.0000 [IU] | Freq: Three times a day (TID) | SUBCUTANEOUS | 11 refills | Status: DC

## 2017-07-31 MED ORDER — INSULIN STARTER KIT- PEN NEEDLES (ENGLISH)
1.0000 | Freq: Once | Status: DC
  Filled 2017-07-31: qty 1

## 2017-07-31 MED ORDER — INSULIN GLARGINE 100 UNIT/ML ~~LOC~~ SOLN: 25.0000 [IU] | Freq: Every day | SUBCUTANEOUS | 11 refills | Status: DC

## 2017-07-31 MED ORDER — METFORMIN HCL 500 MG PO TABS: 500.0000 mg | ORAL_TABLET | Freq: Two times a day (BID) | ORAL | 0 refills | Status: DC

## 2017-07-31 MED ORDER — INSULIN GLARGINE 100 UNIT/ML ~~LOC~~ SOLN: 23.0000 [IU] | Freq: Every day | SUBCUTANEOUS | 11 refills | Status: DC

## 2017-07-31 MED ORDER — INSULIN ASPART 100 UNIT/ML ~~LOC~~ SOLN
4.0000 [IU] | Freq: Three times a day (TID) | SUBCUTANEOUS | Status: DC
  Administered 2017-07-31: 13:00:00 4 [IU] via SUBCUTANEOUS
  Filled 2017-07-31 (×2): qty 1

## 2017-07-31 NOTE — Clinical Social Work Placement (Signed)
   CLINICAL SOCIAL WORK PLACEMENT  NOTE  Date:  07/31/2017  Patient Details  Name: Lynn Potter MRN: [id number] Date of Birth: 03/13/1946  Clinical Social Work is seeking post-discharge placement for this patient at the [place] level of care (*CSW will initial, date and re-position this form in  chart as items are completed):  Yes   Patient/family provided with [place] Work Department's list of facilities offering this level of care within the geographic area requested by the patient (or if unable, by the patient's family).  Yes   Patient/family informed of their freedom to choose among providers that offer the needed level of care, that participate in Medicare, Medicaid or managed care program needed by the patient, have an available bed and are willing to accept the patient.  Yes   Patient/family informed of [facility]'s ownership interest in [facility] and [facility], as well as of the fact that they are under no obligation to receive care at these facilities.  PASRR submitted to EDS on       PASRR number received on       Existing PASRR number confirmed on 07/29/17     FL2 transmitted to all facilities in geographic area requested by pt/family on 07/29/17     FL2 transmitted to all facilities within larger geographic area on       Patient informed that his/her managed care company has contracts with or will negotiate with certain facilities, including the following:        Yes   Patient/family informed of bed offers received.  Patient chooses bed at  ([facility] )     Physician recommends and patient chooses bed at      Patient to be transferred to  ([facility] ) on 07/31/17.  Patient to be transferred to facility by  [facility] )     Patient family notified on 07/31/17 of transfer.  Name of family member notified:   (CSW left patient's daughter [name] a Advertising account executive. )     PHYSICIAN       Additional Comment:     _______________________________________________ Velina Drollinger, Veronia Beets, LCSW 07/31/2017, 1:37 PM

## 2017-07-31 NOTE — Plan of Care (Signed)
Pt was admitted on 10/23 with AMS and blood sugar of 1200 w/out DKA.  Pt was in ICU on insulin drip.  This is a new diagnosis of DM 2.  Hgb A1C is 12.3.  Now on sliding scale insulin w/4u added at mealtime, 25 u lantus and metformin.  We've done a lot of education with patient regarding diet, snacks, administering insulin.  She is reluctant to give herself insulin and impression is she's in denial.  Currently going to [facility].  Concerned that when she goes home she will be non-compliant, but her family is supportive.  CT of head negative for anything acute.  Also did chest xray yesterday that was clean.  IV removed by Nurse Tech.  Called report to [place] at [facility], [phone].  Pt going to room 608.  EMS called for transport.

## 2017-07-31 NOTE — Progress Notes (Signed)
Nutrition Education Note   RD consulted for nutrition education regarding diabetes.   Lab Results  Component Value Date   HGBA1C 12.3 (H) 07/28/2017    RD provided "Nutrition and Type II Diabetes" handout from the Academy of Nutrition and Dietetics. Discussed different food groups and their effects on blood sugar, emphasizing carbohydrate-containing foods. Provided list of carbohydrates and recommended serving sizes of common foods.  Discussed importance of controlled and consistent carbohydrate intake throughout the day. Provided examples of ways to balance meals/snacks and encouraged intake of high-fiber, whole grain complex carbohydrates. Teach back method used.  Expect fair compliance.  Body mass index is 33.78 kg/m. Pt meets criteria for obesity based on current BMI.  Current diet order is HH/CHO, patient is consuming approximately 100% of meals at this time. Labs and medications reviewed. No further nutrition interventions warranted at this time. RD contact information provided. If additional nutrition issues arise, please re-consult RD.  Koleen Distance MS, RD, LDN Pager #- [phone] After Hours Pager: [phone]

## 2017-07-31 NOTE — Progress Notes (Signed)
PT Cancellation Note  Patient Details Name: Lynn Potter MRN: [id number] DOB: [birth year]   Cancelled Treatment:    Reason Eval/Treat Not Completed: Patient declined, no reason specified. Per pt, just returned to bed from sitting in chair, pt stating "I just want to take a nap". Pt agreeable for PTA to return if schedule permits later today.   Antanette Richwine  Christoper Bushey, PTA 07/31/2017, 10:39 AM

## 2017-07-31 NOTE — Progress Notes (Signed)
Patient is medically stable for D/C to [facility] today. Per [facility] liaison patient can come today to room 608. RN will call report to RN Theressa Stamps at [phone] and arrange EMS for transport. Clinical Education officer, museum (CSW) sent D/C orders to [facility] via [facility]. Patient is aware of above. CSW left patient's daughter [name] a Advertising account executive. Please reconsult if future social work needs arise. CSW signing off.   [employer], LCSW [phone]

## 2017-07-31 NOTE — Discharge Summary (Signed)
Lynn Potter, is a 71 y.o. female  DOB [birth year]  MRN [id number].  Admission date:  07/28/2017  Admitting Physician  Vaughan Basta, MD  Discharge Date:  07/31/2017   Primary MD  Remi Haggard, FNP  Recommendations for primary care physician for things to follow:   Follow-up with PCP in 1 week   Admission Diagnosis  Acute kidney injury ([place]) [N17.9] Hyperosmolar non-ketotic state in patient with type 2 diabetes mellitus ([place]) [E11.01] Altered mental status, unspecified altered mental status type [R41.82]   Discharge Diagnosis  Acute kidney injury ([place]) [N17.9] Hyperosmolar non-ketotic state in patient with type 2 diabetes mellitus ([place]) [E11.01] Altered mental status, unspecified altered mental status type [R41.82]    Principal Problem:   Hyperglycemia      Past Medical History:  Diagnosis Date  . AF (atrial fibrillation) ([place])   . Arthritis   . Asthma   . Chicken pox   . Glaucoma   . Hemorrhoids   . History of hiatal hernia   . Hypertension   . Migraines   . Obesity   . Opiate use 03/16/2017  . Personal history of tobacco use, presenting hazards to health 06/18/2015  . Sleep apnea     Past Surgical History:  Procedure Laterality Date  . ABDOMINAL HYSTERECTOMY    . BACK SURGERY    . carpal tunnell    . CATARACT EXTRACTION    . CHOLECYSTECTOMY    . COLONOSCOPY WITH ESOPHAGOGASTRODUODENOSCOPY (EGD)    . ESOPHAGOGASTRODUODENOSCOPY (EGD) WITH PROPOFOL N/A 02/16/2017   Procedure: ESOPHAGOGASTRODUODENOSCOPY (EGD) WITH PROPOFOL;  Surgeon: Manya Silvas, MD;  Location: [facility] ENDOSCOPY;  Service: Endoscopy;  Laterality: N/A;  . EYE SURGERY    . JOINT REPLACEMENT    . LUMBAR FUSION    . TOTAL KNEE ARTHROPLASTY    . UPPER ESOPHAGEAL ENDOSCOPIC ULTRASOUND (EUS) N/A 01/03/2016   Procedure: UPPER  ESOPHAGEAL ENDOSCOPIC ULTRASOUND (EUS);  Surgeon: Holly Bodily, MD;  Location[facility] END[facility]COPY;  Service: Gastroenterology;  Laterality: N/A;       History of present illness and  Hospital Course:     Kindly see H&P for history of present illness and admission details, please review complete Labs, Consult reports and Test reports for all details in brief  HPI  from the history and physical done on the day of admission 71 year old female patient admitted because of altered mental status, found to have severely elevated blood sugar with up to 1200 without DKA.  Admitted to intensive care unit  hyperosmolar nonketotic state, started on insulin drip.   Hospital Course  #1. nonketotic hyperglycemia; patient received IV fluids, IV insulin drip, hemoglobin A1c is 12.8, seen by diabetic nurse, Started on Metformin, Lantus, sliding scale insulin with coverage.  Patient is on Lantus 25 units every morning, NovoLog 4 units 3 times daily, NovoLog 0-9 units 3 times daily, NovoLog 0-5 units nightly. She has new onset diabetes mellitus with hyperglycemia on admission.  2 essential hypertension: Slightly hypotensive today, stop her Norvasc at discharge, continue benazepril. 3.  Deconditioning: Physical therapy recommended skilled nursing, patient is stable for [facility] today. #4 hyponatremia, hypokalemia secondary to hyperglycemia: Potassium being replaced. 5.  Peripheral neuropathy secondary to diabetes, continue Lyrica. 6.  Slight left-sided weakness as patient was leaning to left side documented by physical therapy patient told me that she has this problem before.   physical therapist, CT head unremarkable.  Patient does not have any slurred speech.  she has been  eating well, family sister is at bedside.  She is back to baseline.  She is alert, awake, oriented.  Stable for discharge. Discharge Condition: stable   Follow UP  Contact information for after-discharge care     Destination    [facility] Follow up.   Specialty:  [place] information: [address] [place] [phone]                Discharge Instructions  and  Discharge Medications     Discharge Instructions    Amb Referral to Nutrition and Diabetic E    Complete by:  As directed      Allergies as of 07/31/2017   No Known Allergies     Medication List    STOP taking these medications   amLODipine 10 MG tablet Commonly known as:  NORVASC   ciprofloxacin 250 MG tablet Commonly known as:  CIPRO   diazepam 5 MG tablet Commonly known as:  VALIUM     TAKE these medications   albuterol-ipratropium 18-103 MCG/ACT inhaler Commonly known as:  COMBIVENT Inhale 1 puff into the lungs every 6 (six) hours as needed for wheezing or shortness of breath.   aspirin 81 MG tablet Take 81 mg by mouth daily.   benazepril-hydrochlorthiazide 20-25 MG tablet Commonly known as:  LOTENSIN HCT Take 1 tablet by mouth daily.   ferrous sulfate 325 (65 FE) MG EC tablet Take 325 mg by mouth 2 (two) times daily.   fluticasone 110 MCG/ACT inhaler Commonly known as:  FLOVENT HFA Inhale into the lungs 2 (two) times daily.   insulin aspart 100 UNIT/ML injection Commonly known as:  novoLOG Inject 0-9 Units into the skin 3 (three) times daily with meals.   insulin aspart 100 UNIT/ML injection Commonly known as:  novoLOG Inject 0-5 Units into the skin at bedtime.   insulin aspart 100 UNIT/ML injection Commonly known as:  novoLOG Inject 4 Units into the skin 3 (three) times daily with meals.   insulin glargine 100 UNIT/ML injection Commonly known as:  LANTUS Inject 0.25 mLs (25 Units total) into the skin daily.   latanoprost 0.005 % ophthalmic solution Commonly known as:  XALATAN 1 drop at bedtime.   metFORMIN 500 MG tablet Commonly known as:  GLUCOPHAGE Take 1 tablet (500 mg total) by mouth 2 (two) times daily with a  meal.   nitrofurantoin (macrocrystal-monohydrate) 100 MG capsule Commonly known as:  MACROBID Take 1 capsule by mouth 2 (two) times daily.   pantoprazole 40 MG tablet Commonly known as:  PROTONIX Take 1 tablet by mouth daily.   pregabalin 150 MG capsule Commonly known as:  LYRICA Take 150 mg by mouth at bedtime.   vitamin C 1000 MG tablet Take 1,000 mg by mouth daily.   Vitamin D (Ergocalciferol) 50000 units Caps capsule Commonly known as:  DRISDOL Take 50,000 Units by mouth every 7 (seven) days.         Diet and Activity recommendation: See Discharge Instructions above   Consults obtained -diabetic, critical care physician   Major procedures and Radiology Reports - PLEASE review detailed and final reports for all details, in brief -      Dg Chest 2 View  Result Date: 07/30/2017 CLINICAL DATA:  Cough today. EXAM: CHEST  2 VIEW COMPARISON:  CT chest 11/20/2016. Single-view of the chest 07/28/2017. FINDINGS: The lungs are clear. Heart size is upper normal. No pneumothorax or pleural fluid. No acute bony abnormality. IMPRESSION:  No acute disease. Electronically Signed   By: Inge Rise M.D.   On: 07/30/2017 13:59   Ct Head Wo Contrast  Result Date: 07/30/2017 CLINICAL DATA:  Confusion.  Hypertension. EXAM: CT HEAD WITHOUT CONTRAST TECHNIQUE: Contiguous axial images were obtained from the base of the skull through the vertex without intravenous contrast. COMPARISON:  July 28, 2017. FINDINGS: Brain: Mild diffuse atrophy is stable. There is no intracranial mass, hemorrhage, extra-axial fluid collection, or midline shift. There is mild patchy small vessel disease in the centra semiovale bilaterally. Elsewhere gray-white compartments appear normal. No acute infarct evident. Vascular: No hyperdense vessel. There is calcification in each carotid siphon. Skull: The bony calvarium appears intact. Sinuses/Orbits: Visualized paranasal sinuses are clear. Visualized orbits appear  symmetric bilaterally. Other: Mastoid air cells are clear. IMPRESSION: Stable atrophy with fairly mild patchy periventricular small vessel disease. No intracranial mass, hemorrhage, or extra-axial fluid collection. No evidence of acute infarct. There are foci of arterial vascular calcification. Electronically Signed   By: Lowella Grip III M.D.   On: 07/30/2017 16:38   Ct Head Wo Contrast  Result Date: 07/28/2017 CLINICAL DATA:  Altered level of consciousness. EXAM: CT HEAD WITHOUT CONTRAST TECHNIQUE: Contiguous axial images were obtained from the base of the skull through the vertex without intravenous contrast. COMPARISON:  CT scan of June 09, 2017. FINDINGS: Brain: Mild diffuse cortical atrophy is noted. Mild chronic ischemic white matter disease is noted. No mass effect or midline shift is noted. Ventricular size is within normal limits. There is no evidence of mass lesion, hemorrhage or acute infarction. Vascular: No hyperdense vessel or unexpected calcification. Skull: Normal. Negative for fracture or focal lesion. Sinuses/Orbits: No acute finding. Other: None. IMPRESSION: Mild diffuse cortical atrophy. Mild chronic ischemic white matter disease. No acute intracranial abnormality seen. Electronically Signed   By: Marijo Conception, M.D.   On: 07/28/2017 16:11   Dg Chest Port 1 View  Result Date: 07/28/2017 CLINICAL DATA:  Hyperglycemia. Lethargy. Generalized weakness. Recent treatment for urinary tract infection. EXAM: PORTABLE CHEST 1 VIEW COMPARISON:  Screening CT chest 11/20/2016, 11/05/2015 and earlier. Chest x-rays 09/30/2016, 07/06/2007. FINDINGS: Cardiac silhouette mildly enlarged, unchanged. Thoracic aorta mildly tortuous, unchanged. Hilar and mediastinal contours otherwise unremarkable. Lungs clear. Bronchovascular markings normal. Pulmonary vascularity normal. No visible pleural effusions. No pneumothorax. Degenerative changes involving the shoulder joints as noted previously.  IMPRESSION: Stable cardiomegaly.  No acute cardiopulmonary disease. Electronically Signed   By: Evangeline Dakin M.D.   On: 07/28/2017 16:14    Micro Results     Recent Results (from the past 240 hour(s))  MRSA PCR Screening     Status: None   Collection Time: 07/28/17  6:59 PM  Result Value Ref Range Status   MRSA by PCR NEGATIVE NEGATIVE Final    Comment:        The GeneXpert MRSA Assay (FDA approved for NASAL specimens only), is one component of a comprehensive MRSA colonization surveillance program. It is not intended to diagnose MRSA infection nor to guide or monitor treatment for MRSA infections.        Today   Subjective:   Lynn Potter today is stable for discharge to skilled nursing.  Objective:   Blood pressure 127/60, pulse 88, temperature 98.3 F (36.8 C), temperature source Oral, resp. rate 20, height 5\' 5"  (1.651 m), weight 92.1 kg (203 lb), SpO2 99 %.   Intake/Output Summary (Last 24 hours) at 07/31/17 0942 Last data filed at 07/30/17 1902  Gross per 24  hour  Intake              360 ml  Output                0 ml  Net              360 ml    Exam Awake Alert, Oriented x 3, No new F.N deficits, Normal affect [place].AT,PERRAL Supple Neck,No JVD, No cervical lymphadenopathy appriciated.  Symmetrical Chest wall movement, Good air movement bilaterally, CTAB RRR,No Gallops,Rubs or new Murmurs, No Parasternal Heave +ve B.Sounds, Abd Soft, Non tender, No organomegaly appriciated, No rebound -guarding or rigidity. No Cyanosis, Clubbing or edema, No new Rash or bruise  Data Review   CBC w Diff:  Lab Results  Component Value Date   WBC 6.9 07/30/2017   HGB 11.5 (L) 07/30/2017   HGB 11.8 (L) 09/21/2014   HCT 34.7 (L) 07/30/2017   HCT 36.5 09/21/2014   PLT 194 07/30/2017   PLT 287 09/21/2014   LYMPHOPCT 28 06/09/2017   LYMPHOPCT 28.8 09/21/2014   MONOPCT 5 06/09/2017   MONOPCT 7.0 09/21/2014   EOSPCT 1 06/09/2017   EOSPCT 2.2 09/21/2014   BASOPCT  1 06/09/2017   BASOPCT 0.6 09/21/2014    CMP:  Lab Results  Component Value Date   NA 134 (L) 07/31/2017   NA 141 03/16/2017   NA 137 09/21/2014   K 3.2 (L) 07/31/2017   K 3.3 (L) 09/21/2014   CL 102 07/31/2017   CL 99 09/21/2014   CO2 25 07/31/2017   CO2 30 09/21/2014   BUN 9 07/31/2017   BUN 7 (L) 03/16/2017   BUN 5 (L) 09/21/2014   CREATININE 0.79 07/31/2017   CREATININE 0.82 09/21/2014   PROT 8.5 (H) 07/28/2017   PROT 6.7 03/16/2017   PROT 7.5 09/21/2014   ALBUMIN 3.9 07/28/2017   ALBUMIN 3.6 03/16/2017   ALBUMIN 3.0 (L) 09/21/2014   BILITOT 1.3 (H) 07/28/2017   BILITOT 0.3 03/16/2017   BILITOT 0.5 09/21/2014   ALKPHOS 104 07/28/2017   ALKPHOS 100 09/21/2014   AST 24 07/28/2017   AST 10 (L) 09/21/2014   ALT 14 07/28/2017   ALT 15 09/21/2014  .   Total Time in preparing paper work, data evaluation and todays exam - 64 minutes  Avantika Shere M.D on 07/31/2017 at 9:42 AM    Note: This dictation was prepared with Dragon dictation along with smaller phrase technology. Any transcriptional errors that result from this process are unintentional.

## 2017-07-31 NOTE — Progress Notes (Signed)
Inpatient Diabetes Program Recommendations  AACE/ADA: New Consensus Statement on Inpatient Glycemic Control (2015)  Target Ranges:  Prepandial:   less than 140 mg/dL      [facility] postprandial:   less than 180 mg/dL (1-2 hours)      Critically ill patients:  140 - 180 mg/dL   Lab Results  Component Value Date   GLUCAP 186 (H) 07/31/2017   HGBA1C 12.3 (H) 07/28/2017    Review of Glycemic Control  Results for RAYLEI, LOSURDO (MRN [id number]) as of 07/31/2017 08:09  Ref. Range 07/30/2017 07:36 07/30/2017 11:37 07/30/2017 16:59 07/30/2017 21:40 07/31/2017 07:45  Glucose-Capillary Latest Ref Range: 65 - 99 mg/dL 364 (H) 225 (H) 208 (H) 228 (H) 186 (H)    Diabetes history: New onset Type 2 diabetes Outpatient Diabetes medications: None Current orders for Inpatient glycemic control:  Lantus 23 units daily, Novolog 0-9 units TID with meals, Novolog 0-5 units QHS  Inpatient Diabetes Program Recommendations: HgbA1C: A1C 12.3% on 07/28/17 indicating an average glucose of 306 mg/dl over the past 2-3 months.   RNs to provide ongoing basic DM education at bedside with this patient and engage patient to actively check blood glucose and administer insulin injections.  Consider increasing Lantus to 25 units qam (fasting blood sugar elevated), add Novolog 4 units tid (post prandial blood sugars remain elevated) and continue Novolog 0-9units tid and Novolog 0-5units qhs.  (I would discharge patient to SNF with this regiment)  Daughter and husband not available this am for insulin teaching as arranged with patient.  Insulin starter kit ordered for family- consider reviewing insulin pen and syringe teaching if family comes to visit.   Gentry Fitz, RN, BA, MHA, CDE Diabetes Coordinator Inpatient Diabetes Program  [phone] (Team Pager) [phone] ([place]) 07/31/2017 8:19 AM

## 2017-07-31 NOTE — Plan of Care (Signed)
Problem: Education: Goal: Knowledge of [facility] Education information/materials will improve Outcome: Progressing VSS, free of falls during shift.  Neuro checks stable.  Reported pain 7/10, refused intervention.  No other complaints overnight.  Purewick in place, draining clear, yellow urine.  Bed in low position, bed alarm on.  Call bell within reach, [place].

## 2017-09-11 ENCOUNTER — Ambulatory Visit: Payer: Medicare Other | Admitting: Podiatry

## 2017-09-18 ENCOUNTER — Encounter: Payer: Self-pay | Admitting: Podiatry

## 2017-09-18 ENCOUNTER — Ambulatory Visit (INDEPENDENT_AMBULATORY_CARE_PROVIDER_SITE_OTHER): Payer: Medicare Other | Admitting: Podiatry

## 2017-09-18 DIAGNOSIS — B351 Tinea unguium: Secondary | ICD-10-CM | POA: Diagnosis not present

## 2017-09-18 DIAGNOSIS — M79676 Pain in unspecified toe(s): Secondary | ICD-10-CM

## 2017-09-21 NOTE — Progress Notes (Signed)
   SUBJECTIVE 71 year old female  presents to office today for follow up evaluation of left insertional achilles tendinitis. She states the pain from the tendinitis has improved significantly. She is also complaining of elongated, thickened nails. Pain while ambulating in shoes. Patient is unable to trim their own nails. Patient is here for further evaluation and treatment.   Past Medical History:  Diagnosis Date  . AF (atrial fibrillation) ([place])   . Arthritis   . Asthma   . Chicken pox   . Glaucoma   . Hemorrhoids   . History of hiatal hernia   . Hypertension   . Migraines   . Obesity   . Opiate use 03/16/2017  . Personal history of tobacco use, presenting hazards to health 06/18/2015  . Sleep apnea     OBJECTIVE General Patient is awake, alert, and oriented x 3 and in no acute distress. Derm Skin is dry and supple bilateral. Negative open lesions or macerations. Remaining integument unremarkable. Nails are tender, long, thickened and dystrophic with subungual debris, consistent with onychomycosis, 1-5 bilateral. No signs of infection noted. Vasc  DP and PT pedal pulses palpable bilaterally. Temperature gradient within normal limits.  Neuro Epicritic and protective threshold sensation diminished bilaterally.  Musculoskeletal Exam No symptomatic pedal deformities noted bilateral. Muscular strength within normal limits.  ASSESSMENT 1. Onychomycosis of nail due to dermatophyte bilateral 3. Pain in foot bilateral  PLAN OF CARE 1. Patient evaluated today. 2. Instructed to maintain good pedal hygiene and foot care.  3. Mechanical debridement of nails 1-5 bilaterally performed using a nail nipper. Filed with dremel without incident.  4. Return to clinic in 3 mos.     Edrick Kins, DPM [facility]  Dr. Edrick Kins, [address]                                        [place], [place] [address]                Office [phone]  Fax [phone]

## 2017-11-17 ENCOUNTER — Telehealth: Payer: Self-pay | Admitting: *Deleted

## 2017-11-17 DIAGNOSIS — Z87891 Personal history of nicotine dependence: Secondary | ICD-10-CM

## 2017-11-17 DIAGNOSIS — Z122 Encounter for screening for malignant neoplasm of respiratory organs: Secondary | ICD-10-CM

## 2017-11-17 NOTE — Telephone Encounter (Signed)
Notified patient that annual lung cancer screening low dose CT scan is due currently or will be in near future. Confirmed that patient is within the age range of 55-77, and asymptomatic, (no signs or symptoms of lung cancer). Patient denies illness that would prevent curative treatment for lung cancer if found. Verified smoking history, (current, 53 pack year). The shared decision making visit was done 03/27/14. Patient is agreeable for CT scan being scheduled.

## 2017-11-26 ENCOUNTER — Other Ambulatory Visit: Payer: Self-pay | Admitting: Family Medicine

## 2017-11-26 ENCOUNTER — Ambulatory Visit
Admission: RE | Admit: 2017-11-26 | Discharge: 2017-11-26 | Disposition: A | Discharge status: Home / Self Care | Payer: Medicare Other | Source: Ambulatory Visit | Attending: Family Medicine | Admitting: Family Medicine

## 2017-11-26 ENCOUNTER — Ambulatory Visit
Admission: RE | Admit: 2017-11-26 | Discharge: 2017-11-26 | Disposition: A | Discharge status: Home / Self Care | Payer: Medicare Other | Source: Ambulatory Visit | Attending: Oncology | Admitting: Oncology

## 2017-11-26 DIAGNOSIS — Z122 Encounter for screening for malignant neoplasm of respiratory organs: Secondary | ICD-10-CM | POA: Diagnosis present

## 2017-11-26 DIAGNOSIS — J439 Emphysema, unspecified: Secondary | ICD-10-CM | POA: Insufficient documentation

## 2017-11-26 DIAGNOSIS — M25511 Pain in right shoulder: Secondary | ICD-10-CM

## 2017-11-26 DIAGNOSIS — Z87891 Personal history of nicotine dependence: Secondary | ICD-10-CM | POA: Diagnosis present

## 2017-11-26 DIAGNOSIS — I700 Atherosclerosis of aorta: Secondary | ICD-10-CM | POA: Insufficient documentation

## 2017-11-30 ENCOUNTER — Encounter: Payer: Self-pay | Admitting: *Deleted

## 2017-12-18 ENCOUNTER — Ambulatory Visit: Payer: Medicare Other | Admitting: Podiatry

## 2018-01-05 ENCOUNTER — Ambulatory Visit (INDEPENDENT_AMBULATORY_CARE_PROVIDER_SITE_OTHER): Payer: Medicare Other | Admitting: Podiatry

## 2018-01-05 ENCOUNTER — Encounter: Payer: Self-pay | Admitting: Podiatry

## 2018-01-05 DIAGNOSIS — M7662 Achilles tendinitis, left leg: Secondary | ICD-10-CM

## 2018-01-05 DIAGNOSIS — M7661 Achilles tendinitis, right leg: Secondary | ICD-10-CM | POA: Diagnosis not present

## 2018-01-05 DIAGNOSIS — B351 Tinea unguium: Secondary | ICD-10-CM

## 2018-01-05 DIAGNOSIS — M79676 Pain in unspecified toe(s): Secondary | ICD-10-CM | POA: Diagnosis not present

## 2018-01-05 DIAGNOSIS — E0843 Diabetes mellitus due to underlying condition with diabetic autonomic (poly)neuropathy: Secondary | ICD-10-CM | POA: Diagnosis not present

## 2018-01-07 NOTE — Progress Notes (Signed)
   SUBJECTIVE Patient with a history of type II diabetes mellitus presents to office today complaining of elongated, thickened nails that cause pain while ambulating in shoes. She is unable to trim her own nails. Patient is here for further evaluation and treatment.   Past Medical History:  Diagnosis Date  . AF (atrial fibrillation) ([place])   . Arthritis   . Asthma   . Chicken pox   . Glaucoma   . Hemorrhoids   . History of hiatal hernia   . Hypertension   . Migraines   . Obesity   . Opiate use 03/16/2017  . Personal history of tobacco use, presenting hazards to health 06/18/2015  . Sleep apnea     OBJECTIVE General Patient is awake, alert, and oriented x 3 and in no acute distress. Derm Skin is dry and supple bilateral. Negative open lesions or macerations. Remaining integument unremarkable. Nails are tender, long, thickened and dystrophic with subungual debris, consistent with onychomycosis, 1-5 bilateral. No signs of infection noted. Vasc  DP and PT pedal pulses palpable bilaterally. Temperature gradient within normal limits.  Neuro Epicritic and protective threshold sensation diminished bilaterally.  Musculoskeletal Exam Pain on palpation noted to the posterior tubercle of the bilateral calcaneus at the insertion of the Achilles tendon consistent with retrocalcaneal bursitis. No symptomatic pedal deformities noted bilateral. Muscular strength within normal limits.  ASSESSMENT 1. Diabetes Mellitus w/ peripheral neuropathy 2. Onychomycosis of nail due to dermatophyte bilateral 3. Insertional achilles tendinitis bilateral - medial aspect  PLAN OF CARE 1. Patient evaluated today. 2. Instructed to maintain good pedal hygiene and foot care. Stressed importance of controlling blood sugar.  3. Mechanical debridement of nails 1-5 bilaterally performed using a nail nipper. Filed with dremel without incident.  4. Injection of 0.5 mL Celestone Soluspan injected into the retrocalcaneal bursa  bilaterally. Care was taken to avoid direct injection into the Achilles tendon. 5. Return to clinic in 3 mos.     Edrick Kins, DPM [facility]  Dr. Edrick Kins, [address]                                        [place], [place] [address]                Office [phone]  Fax [phone]

## 2018-03-12 ENCOUNTER — Other Ambulatory Visit: Payer: Self-pay | Admitting: Otolaryngology

## 2018-03-12 DIAGNOSIS — E041 Nontoxic single thyroid nodule: Secondary | ICD-10-CM

## 2018-04-06 ENCOUNTER — Encounter: Payer: Self-pay | Admitting: Podiatry

## 2018-04-06 ENCOUNTER — Ambulatory Visit (INDEPENDENT_AMBULATORY_CARE_PROVIDER_SITE_OTHER): Payer: Medicare Other | Admitting: Podiatry

## 2018-04-06 DIAGNOSIS — E0843 Diabetes mellitus due to underlying condition with diabetic autonomic (poly)neuropathy: Secondary | ICD-10-CM

## 2018-04-06 DIAGNOSIS — B351 Tinea unguium: Secondary | ICD-10-CM | POA: Diagnosis not present

## 2018-04-06 DIAGNOSIS — M79676 Pain in unspecified toe(s): Secondary | ICD-10-CM | POA: Diagnosis not present

## 2018-04-12 NOTE — Progress Notes (Signed)
   SUBJECTIVE Patient with a history of diabetes mellitus presents to office today complaining of elongated, thickened nails that cause pain while ambulating in shoes. She is unable to trim her own nails. Patient is here for further evaluation and treatment.   Past Medical History:  Diagnosis Date  . AF (atrial fibrillation) ([place])   . Arthritis   . Asthma   . Chicken pox   . Glaucoma   . Hemorrhoids   . History of hiatal hernia   . Hypertension   . Migraines   . Obesity   . Opiate use 03/16/2017  . Personal history of tobacco use, presenting hazards to health 06/18/2015  . Sleep apnea     OBJECTIVE General Patient is awake, alert, and oriented x 3 and in no acute distress. Derm Skin is dry and supple bilateral. Negative open lesions or macerations. Remaining integument unremarkable. Nails are tender, long, thickened and dystrophic with subungual debris, consistent with onychomycosis, 1-5 bilateral. No signs of infection noted. Vasc  DP and PT pedal pulses palpable bilaterally. Temperature gradient within normal limits.  Neuro Epicritic and protective threshold sensation diminished bilaterally.  Musculoskeletal Exam No symptomatic pedal deformities noted bilateral. Muscular strength within normal limits.  ASSESSMENT 1. Diabetes Mellitus w/ peripheral neuropathy 2. Onychomycosis of nail due to dermatophyte bilateral 3. Pain in foot bilateral  PLAN OF CARE 1. Patient evaluated today. 2. Instructed to maintain good pedal hygiene and foot care. Stressed importance of controlling blood sugar.  3. Mechanical debridement of nails 1-5 bilaterally performed using a nail nipper. Filed with dremel without incident.  4. Return to clinic in 3 mos.     Edrick Kins, DPM [facility]  Dr. Edrick Kins, [address]                                        [place], [place] [address]                Office [phone]  Fax [phone]

## 2018-05-23 ENCOUNTER — Emergency Department: Payer: Medicare Other

## 2018-05-23 ENCOUNTER — Other Ambulatory Visit: Payer: Self-pay

## 2018-05-23 ENCOUNTER — Encounter: Payer: Self-pay | Admitting: Emergency Medicine

## 2018-05-23 ENCOUNTER — Inpatient Hospital Stay
Admission: EM | Admit: 2018-05-23 | Discharge: 2018-05-25 | DRG: 392 | Disposition: A | Discharge status: Home / Self Care | Payer: Medicare Other | Attending: Internal Medicine | Admitting: Internal Medicine

## 2018-05-23 ENCOUNTER — Inpatient Hospital Stay: Payer: Medicare Other

## 2018-05-23 DIAGNOSIS — Z6836 Body mass index (BMI) 36.0-36.9, adult: Secondary | ICD-10-CM | POA: Diagnosis not present

## 2018-05-23 DIAGNOSIS — E669 Obesity, unspecified: Secondary | ICD-10-CM | POA: Diagnosis not present

## 2018-05-23 DIAGNOSIS — M542 Cervicalgia: Secondary | ICD-10-CM | POA: Diagnosis present

## 2018-05-23 DIAGNOSIS — I482 Chronic atrial fibrillation: Secondary | ICD-10-CM | POA: Diagnosis not present

## 2018-05-23 DIAGNOSIS — M1990 Unspecified osteoarthritis, unspecified site: Secondary | ICD-10-CM | POA: Diagnosis present

## 2018-05-23 DIAGNOSIS — I10 Essential (primary) hypertension: Secondary | ICD-10-CM | POA: Diagnosis present

## 2018-05-23 DIAGNOSIS — Z7982 Long term (current) use of aspirin: Secondary | ICD-10-CM

## 2018-05-23 DIAGNOSIS — E1142 Type 2 diabetes mellitus with diabetic polyneuropathy: Secondary | ICD-10-CM | POA: Diagnosis present

## 2018-05-23 DIAGNOSIS — Z96659 Presence of unspecified artificial knee joint: Secondary | ICD-10-CM | POA: Diagnosis not present

## 2018-05-23 DIAGNOSIS — I6782 Cerebral ischemia: Secondary | ICD-10-CM | POA: Diagnosis not present

## 2018-05-23 DIAGNOSIS — G253 Myoclonus: Secondary | ICD-10-CM | POA: Diagnosis not present

## 2018-05-23 DIAGNOSIS — E785 Hyperlipidemia, unspecified: Secondary | ICD-10-CM | POA: Diagnosis not present

## 2018-05-23 DIAGNOSIS — Z981 Arthrodesis status: Secondary | ICD-10-CM

## 2018-05-23 DIAGNOSIS — Z794 Long term (current) use of insulin: Secondary | ICD-10-CM

## 2018-05-23 DIAGNOSIS — R55 Syncope and collapse: Secondary | ICD-10-CM | POA: Diagnosis not present

## 2018-05-23 DIAGNOSIS — M25511 Pain in right shoulder: Secondary | ICD-10-CM | POA: Diagnosis not present

## 2018-05-23 DIAGNOSIS — E876 Hypokalemia: Secondary | ICD-10-CM | POA: Diagnosis not present

## 2018-05-23 DIAGNOSIS — J45909 Unspecified asthma, uncomplicated: Secondary | ICD-10-CM | POA: Diagnosis present

## 2018-05-23 DIAGNOSIS — R200 Anesthesia of skin: Secondary | ICD-10-CM | POA: Diagnosis not present

## 2018-05-23 DIAGNOSIS — R29705 NIHSS score 5: Secondary | ICD-10-CM | POA: Diagnosis not present

## 2018-05-23 DIAGNOSIS — G4730 Sleep apnea, unspecified: Secondary | ICD-10-CM | POA: Diagnosis present

## 2018-05-23 DIAGNOSIS — H409 Unspecified glaucoma: Secondary | ICD-10-CM | POA: Diagnosis present

## 2018-05-23 DIAGNOSIS — G459 Transient cerebral ischemic attack, unspecified: Secondary | ICD-10-CM

## 2018-05-23 DIAGNOSIS — K5792 Diverticulitis of intestine, part unspecified, without perforation or abscess without bleeding: Secondary | ICD-10-CM | POA: Diagnosis present

## 2018-05-23 DIAGNOSIS — F17210 Nicotine dependence, cigarettes, uncomplicated: Secondary | ICD-10-CM | POA: Diagnosis present

## 2018-05-23 DIAGNOSIS — M549 Dorsalgia, unspecified: Secondary | ICD-10-CM | POA: Diagnosis not present

## 2018-05-23 DIAGNOSIS — Z79899 Other long term (current) drug therapy: Secondary | ICD-10-CM

## 2018-05-23 DIAGNOSIS — I639 Cerebral infarction, unspecified: Secondary | ICD-10-CM

## 2018-05-23 DIAGNOSIS — N390 Urinary tract infection, site not specified: Secondary | ICD-10-CM | POA: Diagnosis present

## 2018-05-23 DIAGNOSIS — Z8719 Personal history of other diseases of the digestive system: Secondary | ICD-10-CM

## 2018-05-23 DIAGNOSIS — Z9049 Acquired absence of other specified parts of digestive tract: Secondary | ICD-10-CM

## 2018-05-23 HISTORY — DX: Type 2 diabetes mellitus without complications: E11.9

## 2018-05-23 LAB — TROPONIN I

## 2018-05-23 LAB — CBC WITH DIFFERENTIAL/PLATELET
BASOS ABS: 0.1 10*3/uL (ref 0–0.1)
BASOS PCT: 1 %
Eosinophils Absolute: 0.2 10*3/uL (ref 0–0.7)
Eosinophils Relative: 2 %
HCT: 34.8 % — ABNORMAL LOW (ref 35.0–47.0)
HEMOGLOBIN: 11.4 g/dL — AB (ref 12.0–16.0)
Lymphocytes Relative: 34 %
Lymphs Abs: 3.3 10*3/uL (ref 1.0–3.6)
MCH: 25.4 pg — ABNORMAL LOW (ref 26.0–34.0)
MCHC: 32.9 g/dL (ref 32.0–36.0)
MCV: 77.2 fL — ABNORMAL LOW (ref 80.0–100.0)
MONO ABS: 0.8 10*3/uL (ref 0.2–0.9)
Monocytes Relative: 8 %
NEUTROS ABS: 5.4 10*3/uL (ref 1.4–6.5)
NEUTROS PCT: 55 %
Platelets: 250 10*3/uL (ref 150–440)
RBC: 4.51 MIL/uL (ref 3.80–5.20)
RDW: 17.8 % — AB (ref 11.5–14.5)
WBC: 9.8 10*3/uL (ref 3.6–11.0)

## 2018-05-23 LAB — COMPREHENSIVE METABOLIC PANEL
ALT: 14 U/L (ref 0–44)
ANION GAP: 9 (ref 5–15)
AST: 18 U/L (ref 15–41)
Albumin: 3.3 g/dL — ABNORMAL LOW (ref 3.5–5.0)
Alkaline Phosphatase: 79 U/L (ref 38–126)
BUN: 24 mg/dL — ABNORMAL HIGH (ref 8–23)
CO2: 25 mmol/L (ref 22–32)
Calcium: 8.9 mg/dL (ref 8.9–10.3)
Chloride: 104 mmol/L (ref 98–111)
Creatinine, Ser: 0.92 mg/dL (ref 0.44–1.00)
GFR calc Af Amer: >60 mL/min (ref >60)
Glucose, Bld: 101 mg/dL — ABNORMAL HIGH (ref 70–99)
POTASSIUM: 3.2 mmol/L — AB (ref 3.5–5.1)
Sodium: 138 mmol/L (ref 135–145)
TOTAL PROTEIN: 6.7 g/dL (ref 6.5–8.1)
Total Bilirubin: 0.6 mg/dL (ref 0.3–1.2)

## 2018-05-23 LAB — URINALYSIS, COMPLETE (UACMP) WITH MICROSCOPIC
BILIRUBIN URINE: NEGATIVE
Glucose, UA: >=500 mg/dL — AB
Ketones, ur: NEGATIVE mg/dL
NITRITE: NEGATIVE
Protein, ur: NEGATIVE mg/dL
pH: 5 (ref 5.0–8.0)

## 2018-05-23 LAB — LIPASE, BLOOD: LIPASE: 21 U/L (ref 11–51)

## 2018-05-23 LAB — GLUCOSE, CAPILLARY: Glucose-Capillary: 99 mg/dL (ref 70–99)

## 2018-05-23 MED ORDER — ONDANSETRON HCL 4 MG/2ML IJ SOLN
INTRAMUSCULAR | Status: AC
  Filled 2018-05-23: qty 2

## 2018-05-23 MED ORDER — ACETAMINOPHEN 160 MG/5ML PO SOLN
650.0000 mg | ORAL | Status: DC | PRN
  Filled 2018-05-23: qty 20.3

## 2018-05-23 MED ORDER — ONDANSETRON HCL 4 MG/2ML IJ SOLN
4.0000 mg | Freq: Once | INTRAMUSCULAR | Status: AC
  Administered 2018-05-23: 4 mg via INTRAVENOUS

## 2018-05-23 MED ORDER — ACETAMINOPHEN 325 MG PO TABS
650.0000 mg | ORAL_TABLET | ORAL | Status: DC | PRN
  Administered 2018-05-24 – 2018-05-25 (×2): 650 mg via ORAL
  Filled 2018-05-23 (×2): qty 2

## 2018-05-23 MED ORDER — IOPAMIDOL (ISOVUE-300) INJECTION 61%
100.0000 mL | Freq: Once | INTRAVENOUS | Status: AC | PRN
  Administered 2018-05-23: 100 mL via INTRAVENOUS

## 2018-05-23 MED ORDER — ASPIRIN 81 MG PO CHEW
324.0000 mg | CHEWABLE_TABLET | Freq: Once | ORAL | Status: AC
  Administered 2018-05-23: 324 mg via ORAL
  Filled 2018-05-23: qty 4

## 2018-05-23 MED ORDER — ONDANSETRON HCL 4 MG/2ML IJ SOLN: 4.0000 mg | Freq: Four times a day (QID) | INTRAMUSCULAR | Status: DC | PRN

## 2018-05-23 MED ORDER — METRONIDAZOLE IN NACL 5-0.79 MG/ML-% IV SOLN
500.0000 mg | Freq: Once | INTRAVENOUS | Status: AC
  Administered 2018-05-23: 500 mg via INTRAVENOUS
  Filled 2018-05-23: qty 100

## 2018-05-23 MED ORDER — MORPHINE SULFATE (PF) 4 MG/ML IV SOLN
INTRAVENOUS | Status: AC
  Administered 2018-05-23: 4 mg via INTRAVENOUS
  Filled 2018-05-23: qty 1

## 2018-05-23 MED ORDER — MORPHINE SULFATE (PF) 4 MG/ML IV SOLN
4.0000 mg | Freq: Once | INTRAVENOUS | Status: AC
  Administered 2018-05-23: 4 mg via INTRAVENOUS

## 2018-05-23 MED ORDER — CIPROFLOXACIN IN D5W 400 MG/200ML IV SOLN
400.0000 mg | Freq: Two times a day (BID) | INTRAVENOUS | Status: DC
  Administered 2018-05-24: 400 mg via INTRAVENOUS
  Filled 2018-05-23 (×3): qty 200

## 2018-05-23 MED ORDER — ACETAMINOPHEN 650 MG RE SUPP: 650.0000 mg | RECTAL | Status: DC | PRN

## 2018-05-23 MED ORDER — INSULIN ASPART 100 UNIT/ML ~~LOC~~ SOLN: 0.0000 [IU] | Freq: Three times a day (TID) | SUBCUTANEOUS | Status: DC

## 2018-05-23 MED ORDER — INSULIN ASPART 100 UNIT/ML ~~LOC~~ SOLN: 0.0000 [IU] | Freq: Every day | SUBCUTANEOUS | Status: DC

## 2018-05-23 MED ORDER — CIPROFLOXACIN IN D5W 400 MG/200ML IV SOLN
400.0000 mg | Freq: Once | INTRAVENOUS | Status: AC
  Administered 2018-05-23: 400 mg via INTRAVENOUS
  Filled 2018-05-23: qty 200

## 2018-05-23 MED ORDER — STROKE: EARLY STAGES OF RECOVERY BOOK
Freq: Once | Status: AC
  Administered 2018-05-24: 02:00:00

## 2018-05-23 MED ORDER — POTASSIUM CHLORIDE IN NACL 20-0.9 MEQ/L-% IV SOLN
INTRAVENOUS | Status: DC
  Administered 2018-05-24 (×2): via INTRAVENOUS
  Filled 2018-05-23 (×4): qty 1000

## 2018-05-23 MED ORDER — ENOXAPARIN SODIUM 40 MG/0.4ML ~~LOC~~ SOLN
40.0000 mg | SUBCUTANEOUS | Status: DC
  Administered 2018-05-24: 40 mg via SUBCUTANEOUS

## 2018-05-23 MED ORDER — ONDANSETRON HCL 4 MG PO TABS: 4.0000 mg | ORAL_TABLET | Freq: Four times a day (QID) | ORAL | Status: DC | PRN

## 2018-05-23 MED ORDER — IOPAMIDOL (ISOVUE-300) INJECTION 61%
30.0000 mL | Freq: Once | INTRAVENOUS | Status: AC | PRN
  Administered 2018-05-23: 30 mL via ORAL

## 2018-05-23 MED ORDER — INSULIN DETEMIR 100 UNIT/ML ~~LOC~~ SOLN
12.0000 [IU] | Freq: Every day | SUBCUTANEOUS | Status: DC
  Administered 2018-05-24 – 2018-05-25 (×2): 12 [IU] via SUBCUTANEOUS
  Filled 2018-05-23 (×2): qty 0.12

## 2018-05-23 MED ORDER — METRONIDAZOLE IN NACL 5-0.79 MG/ML-% IV SOLN
500.0000 mg | Freq: Three times a day (TID) | INTRAVENOUS | Status: DC
  Administered 2018-05-24 (×2): 500 mg via INTRAVENOUS
  Filled 2018-05-23 (×5): qty 100

## 2018-05-23 MED ORDER — ENOXAPARIN SODIUM 40 MG/0.4ML ~~LOC~~ SOLN
40.0000 mg | SUBCUTANEOUS | Status: DC
  Administered 2018-05-24: 40 mg via SUBCUTANEOUS
  Filled 2018-05-23 (×2): qty 0.4

## 2018-05-23 NOTE — ED Notes (Signed)
Pt cleaned and brief changed on arrival. Pt states that she had a bowel movement in her brief unintentionally and that has never happened before.

## 2018-05-23 NOTE — ED Triage Notes (Signed)
[place] [facility] after near syncopal episode and bilateral flank pain radiating to abd.  Per EMS pt gets light headed and weak when standing.  EMS vitals 103/78 BP, 58 HR, 122 CBG, 96% RA, and given 218mL normal saline.  Pt had BM en route.

## 2018-05-23 NOTE — ED Provider Notes (Signed)
[facility] Emergency Department Provider Note   ____________________________________________   First MD Initiated Contact with Patient 05/23/18 1407     (approximate)  I have reviewed the triage vital signs and the nursing notes.   HISTORY  Chief Complaint Near Syncope    HPI Lynn Potter is a 72 y.o. female who is in charge.  She had a near syncopal episode with severe left-sided abdominal pain.  The pain is worse with hitting the bumps in the road and with palpation.  She also told the nurse had bilateral flank pain.  She is getting lightheaded and weak when standing.  Pain is sharp stabbing again in the left lower quadrant.  She has a history of diverticulosis and diverticulitis. Patient also complains of some pain in the right side of the neck and right shoulder.  The right shoulder pain is worse with palpation.  Past Medical History:  Diagnosis Date  . AF (atrial fibrillation) ([place])   . Arthritis   . Asthma   . Chicken pox   . Diabetes mellitus without complication ([place])   . Glaucoma   . Hemorrhoids   . History of hiatal hernia   . Hypertension   . Migraines   . Obesity   . Opiate use 03/16/2017  . Personal history of tobacco use, presenting hazards to health 06/18/2015  . Sleep apnea     Patient Active Problem List   Diagnosis Date Noted  . Hyperglycemia 07/28/2017  . Vitamin D insufficiency 03/25/2017  . Low back pain (primary )(left greater than right) 03/17/2017  . Chronic pain syndrome 03/16/2017  . Encounter for long-term opiate analgesic use 03/16/2017  . Long term current use of opiate analgesic 03/16/2017  . Opiate use 03/16/2017  . 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 Sacroiliac joint pain 03/16/2017  . Sepsis ([place]) 09/30/2016  . Primary osteoarthritis of right shoulder 10/17/2015  . Personal history of tobacco use, presenting hazards to health 06/18/2015  . Osteoarthritis of shoulder region 03/10/2014  .  Rotator cuff tendinitis 03/10/2014  . S/P rotator cuff repair 03/10/2014    Past Surgical History:  Procedure Laterality Date  . ABDOMINAL HYSTERECTOMY    . BACK SURGERY    . carpal tunnell    . CATARACT EXTRACTION    . CHOLECYSTECTOMY    . COLONOSCOPY WITH ESOPHAGOGASTRODUODENOSCOPY (EGD)    . ESOPHAGOGASTRODUODENOSCOPY (EGD) WITH PROPOFOL N/A 02/16/2017   Procedure: ESOPHAGOGASTRODUODENOSCOPY (EGD) WITH PROPOFOL;  Surgeon: Manya Silvas, MD;  Location: [facility] ENDOSCOPY;  Service: Endoscopy;  Laterality: N/A;  . EYE SURGERY    . JOINT REPLACEMENT    . LUMBAR FUSION    . TOTAL KNEE ARTHROPLASTY    . UPPER ESOPHAGEAL ENDOSCOPIC ULTRASOUND (EUS) N/A 01/03/2016   Procedure: UPPER ESOPHAGEAL ENDOSCOPIC ULTRASOUND (EUS);  Surgeon: Holly Bodily, MD;  Location: [facility] ENDOSCOPY;  Service: Gastroenterology;  Laterality: N/A;    Prior to Admission medications   Medication Sig Start Date End Date Taking? Authorizing Provider  Ascorbic Acid (VITAMIN C) 1000 MG tablet Take 1,000 mg by mouth daily.   Yes [provider]  aspirin 81 MG tablet Take 81 mg by mouth daily.   Yes [provider]  atorvastatin (LIPITOR) 10 MG tablet Take 10 mg by mouth daily.    Yes [provider]  azelastine (ASTELIN) 0.1 % nasal spray Place 1 spray into both nostrils 2 (two) times daily as needed for rhinitis or allergies.  03/12/18  Yes [provider]  Cholecalciferol (D-5000) 5000 units TABS Take 5,000 Units by mouth daily.    Yes [provider]  ferrous sulfate 325 (65 FE) MG EC tablet Take 325 mg by mouth 2 (two) times daily.    Yes [provider]  hydrOXYzine (VISTARIL) 25 MG capsule Take 25 mg by mouth every 12 (twelve) hours.  03/22/18  Yes [provider]  insulin aspart (NOVOLOG) 100 UNIT/ML injection Inject 0-9 Units into the skin 3 (three) times daily with meals. 07/31/17  Yes Epifanio Lesches, MD  JARDIANCE 25 MG TABS tablet Take 25 mg  by mouth daily.  03/25/18  Yes [provider]  latanoprost (XALATAN) 0.005 % ophthalmic solution Place 1 drop into both eyes at bedtime.    Yes [provider]  LEVEMIR FLEXTOUCH 100 UNIT/ML Pen Inject 25 Units into the skin daily.  03/18/18  Yes [provider]  metFORMIN (GLUCOPHAGE) 500 MG tablet Take 1 tablet (500 mg total) by mouth 2 (two) times daily with a meal. 07/31/17  Yes Epifanio Lesches, MD  olmesartan-hydrochlorothiazide (BENICAR HCT) 40-25 MG tablet Take 1 tablet by mouth daily. 03/17/18  Yes [provider]  pantoprazole (PROTONIX) 40 MG tablet Take 1 tablet by mouth 2 (two) times daily.    Yes [provider]  pregabalin (LYRICA) 150 MG capsule Take 150 mg by mouth 3 (three) times daily.    Yes [provider]  benazepril (LOTENSIN) 20 MG tablet Take 20 mg by mouth daily.    [provider]  insulin aspart (NOVOLOG) 100 UNIT/ML injection Inject 0-5 Units into the skin at bedtime. Patient not taking: Reported on 05/23/2018 07/31/17   Epifanio Lesches, MD  insulin aspart (NOVOLOG) 100 UNIT/ML injection Inject 4 Units into the skin 3 (three) times daily with meals. Patient not taking: Reported on 05/23/2018 07/31/17   Epifanio Lesches, MD  insulin glargine (LANTUS) 100 UNIT/ML injection Inject 0.25 mLs (25 Units total) into the skin daily. Patient not taking: Reported on 05/23/2018 07/31/17   Epifanio Lesches, MD    Allergies Patient has no known allergies.  Family History  Problem Relation Age of Onset  . Diabetes Mother   . Diabetes Sister     Social History Social History   Tobacco Use  . Smoking status: Current Every Day Smoker    Packs/day: 1.00    Types: Cigarettes  . Smokeless tobacco: Never Used  Substance Use Topics  . Alcohol use: No  . Drug use: No    Review of Systems  Constitutional: No fever/chills although patient is now complaining of feeling hot and cold Eyes: No visual  changes. ENT: No sore throat. Cardiovascular: Denies chest pain. Respiratory: Denies shortness of breath. Gastrointestinal:  abdominal pain.   nausea, no vomiting.  No diarrhea.  No constipation. Genitourinary: Negative for dysuria. Musculoskeletal: Negative for back pain. Skin: Negative for rash. Neurological: Negative for headaches, focal weakness   ____________________________________________   PHYSICAL EXAM:  VITAL SIGNS: ED Triage Vitals [05/23/18 1439]  Enc Vitals Group     BP      Pulse      Resp      Temp      Temp src      SpO2      Weight 201 lb (91.2 kg)     Height 5' 4.5" (1.638 m)     Head Circumference      Peak Flow      Pain Score 9     Pain Loc  Pain Edu?    Constitutional: Alert and oriented. Ill appearing and in  acute distress. Eyes: Conjunctivae are normal.  Head: Atraumatic. Nose: No congestion/rhinnorhea. Mouth/Throat: Mucous membranes are moist.  Oropharynx non-erythematous. Neck: No stridor. Cardiovascular: Normal rate, regular rhythm. Grossly normal heart sounds.  Good peripheral circulation. Respiratory: Normal respiratory effort.  No retractions. Lungs CTAB. Gastrointestinal: Soft tender to palpation percussion left lower quadrant. No distention. No abdominal bruits. No CVA tenderness. Musculoskeletal: No lower extremity tenderness nor edema.  Neurologic:  Normal speech and language. No gross focal neurologic deficits are appreciated.  Skin:  Skin is warm, dry and intact.  Psychiatric: Mood and affect are normal. Speech and behavior are normal.  ____________________________________________   LABS (all labs ordered are listed, but only abnormal results are displayed)  Labs Reviewed  COMPREHENSIVE METABOLIC PANEL - Abnormal; Notable for the following components:      Result Value   Potassium 3.2 (*)    Glucose, Bld 101 (*)    BUN 24 (*)    Albumin 3.3 (*)    All other components within normal limits  CBC WITH  DIFFERENTIAL/PLATELET - Abnormal; Notable for the following components:   Hemoglobin 11.4 (*)    HCT 34.8 (*)    MCV 77.2 (*)    MCH 25.4 (*)    RDW 17.8 (*)    All other components within normal limits  TROPONIN I  LIPASE, BLOOD  URINALYSIS, COMPLETE (UACMP) WITH MICROSCOPIC   ____________________________________________  EKG  EKG read interpreted by me shows sinus bradycardia rate of 48 there are flipped T waves diffusely which are new from October 2018 ____________________________________________  RADIOLOGY  ED MD interpretation:   Official radiology report(s): Ct Head Wo Contrast  Result Date: 05/23/2018 CLINICAL DATA:  Syncope EXAM: CT HEAD WITHOUT CONTRAST TECHNIQUE: Contiguous axial images were obtained from the base of the skull through the vertex without intravenous contrast. COMPARISON:  July 30, 2017 FINDINGS: Brain: Mild diffuse atrophy is stable. There is no intracranial mass, hemorrhage, extra-axial fluid collection, or midline shift. There is patchy small vessel disease in the centra semiovale bilaterally, stable. There is no acute infarct evident. No new gray-white compartment lesion. Vascular: No hyperdense vessel evident. There is calcification in each carotid siphon region. Skull: Bony calvarium appears intact. Sinuses/Orbits: Visualized paranasal sinuses are clear. Visualized orbits appear symmetric bilaterally. Other: Mastoid air cells are clear. IMPRESSION: Atrophy with patchy periventricular small vessel disease. No acute infarct evident. No mass or hemorrhage. There are foci of arterial vascular calcification. Electronically Signed   By: Lowella Grip III M.D.   On: 05/23/2018 17:38   Ct Abdomen Pelvis W Contrast  Result Date: 05/23/2018 CLINICAL DATA:  Flank and abdominal pain.  Diarrhea. EXAM: CT ABDOMEN AND PELVIS WITH CONTRAST TECHNIQUE: Multidetector CT imaging of the abdomen and pelvis was performed using the standard protocol following bolus  administration of intravenous contrast. CONTRAST:  185mL ISOVUE-300 IOPAMIDOL (ISOVUE-300) INJECTION 61% COMPARISON:  Feb 24, 2011 FINDINGS: Lower chest: There is mild bibasilar atelectasis. No lung base edema or consolidation. Hepatobiliary: No focal liver lesions are appreciable. Gallbladder is absent. There is intrahepatic and extrahepatic biliary duct dilatation. No biliary duct mass or calculus is appreciable. Pancreas: No pancreatic mass or inflammatory focus is evident. Spleen: No splenic lesions are appreciable. Adrenals/Urinary Tract: Adrenals bilaterally appear normal. Kidneys bilaterally show no evident mass or hydronephrosis on either side. There is no appreciable renal or ureteral calculus on either side. Urinary bladder is midline with wall thickness within normal  limits. Stomach/Bowel: There are multiple sigmoid diverticula. There is wall thickening in the mid the distal sigmoid colon, likely due to chronic muscular hypertrophy from chronic diverticulosis. There is subtle mesenteric thickening along the leftward aspect of the mid sigmoid colon, likely early changes of diverticulitis. No abscess or perforation evident. There is no appreciable bowel wall thickening elsewhere. No bowel obstruction evident. No free air or portal venous air. Vascular/Lymphatic: There is aortic atherosclerosis. There is no evident aneurysm. Major mesenteric arterial vessels appear patent. There is a circumaortic left renal vein, an anatomic variant. There is no appreciable adenopathy in the abdomen or pelvis. Reproductive: Uterus is absent.  No evident pelvic mass. Other: There is mesh in the abdominal wall region anteriorly with scarring but no appreciable aneurysm in the anterior abdominal wall area. There is no appreciable periappendiceal region inflammation. No abscess or ascites is evident in the abdomen or pelvis. Musculoskeletal: There is postoperative change throughout the lower lumbar spine. There is multifocal  lower thoracic and lumbar osteoarthritic change. There is osteoarthritic change in each hip joint. No evident blastic or lytic bone lesions. There is no intramuscular or abdominal wall lesion evident. IMPRESSION: 1. Changes of early diverticulitis in the mid sigmoid colon. Specifically, there is mild mesenteric thickening to the left of the sigmoid colon. Most of the wall thickening in the sigmoid colon is likely due to muscular hypertrophy from chronic diverticulosis. There may be contribution from early diverticulitis, however. No abscess or perforation in this area. 2. No evident bowel obstruction. No abscess in the abdomen pelvis. No periappendiceal region inflammation. 3. Postoperative change anterior abdominal wall with scarring but no hernia. 4. Absent gallbladder. There is intrahepatic and extrahepatic biliary duct dilatation. No biliary duct mass or calculus evident by CT. 5.  No evident renal or ureteral calculus.  No hydronephrosis. 6.  Aortic atherosclerosis. 7. Multifocal postoperative change in the lower lumbar region. Multilevel arthropathy in the lower thoracic and lumbar regions. 8.  Uterus absent. Aortic Atherosclerosis (ICD10-I70.0). Electronically Signed   By: Lowella Grip III M.D.   On: 05/23/2018 17:34    ____________________________________________   PROCEDURES  Procedure(s) performed:   Procedures  Critical Care performed:   ____________________________________________   INITIAL IMPRESSION / ASSESSMENT AND PLAN / ED COURSE  ----------------------------------------- 4:00 PM on 05/23/2018 -----------------------------------------  Patient now complains of left facial numbness.  There is no droopiness blurry vision numbness elsewhere or weakness anywhere in her body.  Patient says it happened when she got here but did not tell us until just now.  Neuro exam was done again and shows only numbness in the left side of the face there is no facial droop as I noted cranial  nerves II through XII are intact all the cranial nerves are intact motor strength is 5/5 throughout there is no other sensory changes.         ____________________________________________   FINAL CLINICAL IMPRESSION(S) / ED DIAGNOSES  Final diagnoses:  Near syncope  Diverticulitis  Cerebrovascular accident (CVA), unspecified mechanism [facility])     ED Discharge Orders    None       Note:  This document was prepared using Dragon voice recognition software and may include unintentional dictation errors.    Nena Polio, MD 05/23/18 2050

## 2018-05-23 NOTE — ED Notes (Signed)
Christina RN, aware of bed assigned  

## 2018-05-23 NOTE — H&P (Signed)
Lynn Potter at [place] NAME: Lynn Potter    MR#:  [id number]  DATE OF BIRTH:  August 09, 1946  DATE OF ADMISSION:  05/23/2018  PRIMARY CARE PHYSICIAN: Remi Haggard, FNP   REQUESTING/REFERRING PHYSICIAN: Rip Harbour  CHIEF COMPLAINT:  Near syncope  HISTORY OF PRESENT ILLNESS:  Lynn Potter  is a 72 y.o. female with a known history of chronic atrial fibrillation, diabetes mellitus, hypertension, multiple other medical problems is presented to ED with a chief complaint of left lower quadrant abdominal pain which is really severe and also complaining of some back pain.  Patient is reporting left-sided numbness including left facial numbness.  CT head is negative.  CT abdomen has revealed acute diverticulitis.  Hospitalist team is called to admit the patient.  PAST MEDICAL HISTORY:   Past Medical History:  Diagnosis Date  . AF (atrial fibrillation) ([place])   . Arthritis   . Asthma   . Chicken pox   . Diabetes mellitus without complication ([place])   . Glaucoma   . Hemorrhoids   . History of hiatal hernia   . Hypertension   . Migraines   . Obesity   . Opiate use 03/16/2017  . Personal history of tobacco use, presenting hazards to health 06/18/2015  . Sleep apnea     PAST SURGICAL HISTOIRY:   Past Surgical History:  Procedure Laterality Date  . ABDOMINAL HYSTERECTOMY    . BACK SURGERY    . carpal tunnell    . CATARACT EXTRACTION    . CHOLECYSTECTOMY    . COLONOSCOPY WITH ESOPHAGOGASTRODUODENOSCOPY (EGD)    . ESOPHAGOGASTRODUODENOSCOPY (EGD) WITH PROPOFOL N/A 02/16/2017   Procedure: ESOPHAGOGASTRODUODENOSCOPY (EGD) WITH PROPOFOL;  Surgeon: Manya Silvas, MD;  Location: [facility] ENDOSCOPY;  Service: Endoscopy;  Laterality: N/A;  . EYE SURGERY    . JOINT REPLACEMENT    . LUMBAR FUSION    . TOTAL KNEE ARTHROPLASTY    . UPPER ESOPHAGEAL ENDOSCOPIC ULTRASOUND (EUS) N/A 01/03/2016   Procedure: UPPER ESOPHAGEAL ENDOSCOPIC ULTRASOUND (EUS);   Surgeon: Holly Bodily, MD;  Location: [facility] ENDOSCOPY;  Service: Gastroenterology;  Laterality: N/A;    SOCIAL HISTORY:   Social History   Tobacco Use  . Smoking status: Current Every Day Smoker    Packs/day: 1.00    Types: Cigarettes  . Smokeless tobacco: Never Used  Substance Use Topics  . Alcohol use: No    FAMILY HISTORY:   Family History  Problem Relation Age of Onset  . Diabetes Mother   . Diabetes Sister     DRUG ALLERGIES:  No Known Allergies  REVIEW OF SYSTEMS:  CONSTITUTIONAL: No fever, fatigue or weakness.  EYES: No blurred or double vision.  EARS, NOSE, AND THROAT: No tinnitus or ear pain.  RESPIRATORY: No cough, shortness of breath, wheezing or hemoptysis.  CARDIOVASCULAR: No chest pain, orthopnea, edema.  GASTROINTESTINAL: No nausea, vomiting, diarrhea , reporting left lower quadrant abdominal pain.  GENITOURINARY: No dysuria, hematuria.  ENDOCRINE: No polyuria, nocturia,  HEMATOLOGY: No anemia, easy bruising or bleeding SKIN: No rash or lesion. MUSCULOSKELETAL: No joint pain or arthritis.   NEUROLOGIC: Left-sided weakness and numbness  PSYCHIATRY: No anxiety or depression.   MEDICATIONS AT HOME:   Prior to Admission medications   Medication Sig Start Date End Date Taking? Authorizing Provider  Ascorbic Acid (VITAMIN C) 1000 MG tablet Take 1,000 mg by mouth daily.   Yes [provider]  aspirin 81 MG tablet Take 81 mg by mouth  daily.   Yes [provider]  atorvastatin (LIPITOR) 10 MG tablet Take 10 mg by mouth daily.    Yes [provider]  azelastine (ASTELIN) 0.1 % nasal spray Place 1 spray into both nostrils 2 (two) times daily as needed for rhinitis or allergies.  03/12/18  Yes [provider]  Cholecalciferol (D-5000) 5000 units TABS Take 5,000 Units by mouth daily.    Yes [provider]  ferrous sulfate 325 (65 FE) MG EC tablet Take 325 mg by mouth 2 (two) times daily.    Yes [provider]  hydrOXYzine (VISTARIL) 25 MG capsule Take 25 mg by mouth every 12 (twelve) hours.  03/22/18  Yes [provider]  insulin aspart (NOVOLOG) 100 UNIT/ML injection Inject 0-9 Units into the skin 3 (three) times daily with meals. 07/31/17  Yes Epifanio Lesches, MD  JARDIANCE 25 MG TABS tablet Take 25 mg by mouth daily.  03/25/18  Yes [provider]  latanoprost (XALATAN) 0.005 % ophthalmic solution Place 1 drop into both eyes at bedtime.    Yes [provider]  LEVEMIR FLEXTOUCH 100 UNIT/ML Pen Inject 25 Units into the skin daily.  03/18/18  Yes [provider]  metFORMIN (GLUCOPHAGE) 500 MG tablet Take 1 tablet (500 mg total) by mouth 2 (two) times daily with a meal. 07/31/17  Yes Epifanio Lesches, MD  olmesartan-hydrochlorothiazide (BENICAR HCT) 40-25 MG tablet Take 1 tablet by mouth daily. 03/17/18  Yes [provider]  pantoprazole (PROTONIX) 40 MG tablet Take 1 tablet by mouth 2 (two) times daily.    Yes [provider]  pregabalin (LYRICA) 150 MG capsule Take 150 mg by mouth 3 (three) times daily.    Yes [provider]  benazepril (LOTENSIN) 20 MG tablet Take 20 mg by mouth daily.    [provider]  insulin aspart (NOVOLOG) 100 UNIT/ML injection Inject 0-5 Units into the skin at bedtime. Patient not taking: Reported on 05/23/2018 07/31/17   Epifanio Lesches, MD  insulin aspart (NOVOLOG) 100 UNIT/ML injection Inject 4 Units into the skin 3 (three) times daily with meals. Patient not taking: Reported on 05/23/2018 07/31/17   Epifanio Lesches, MD  insulin glargine (LANTUS) 100 UNIT/ML injection Inject 0.25 mLs (25 Units total) into the skin daily. Patient not taking: Reported on 05/23/2018 07/31/17   Epifanio Lesches, MD      VITAL SIGNS:  Blood pressure 129/71, pulse (!) 49, temperature 97.9 F (36.6 C), temperature source Oral, resp. rate 20, height 5' 4.5" (1.638 m), weight 92.7 kg, SpO2  100 %.  PHYSICAL EXAMINATION:  GENERAL:  72 y.o.-year-old patient lying in the bed with no acute distress.  EYES: Pupils equal, round, reactive to light and accommodation. No scleral icterus. Extraocular muscles intact.  HEENT: Head atraumatic, normocephalic. Oropharynx and nasopharynx clear.  NECK:  Supple, no jugular venous distention. No thyroid enlargement, no tenderness.  LUNGS: Normal breath sounds bilaterally, no wheezing, rales,rhonchi or crepitation. No use of accessory muscles of respiration.  CARDIOVASCULAR: S1, S2 normal. No murmurs, rubs, or gallops.  ABDOMEN: Soft, left lower quadrant tenderness no rebound tenderness, distended.  Bowel sounds are positive EXTREMITIES: No pedal edema, cyanosis, or clubbing.  NEUROLOGIC: Cranial nerves II through XII are intact. Muscle strength 5/5 in all extremities.  Left side with decreased sensation. Gait not checked.  PSYCHIATRIC: The patient is alert and oriented x 3.  SKIN: No obvious rash, lesion, or ulcer.   LABORATORY PANEL:   CBC Recent Labs  Lab  05/23/18 1450  WBC 9.8  HGB 11.4*  HCT 34.8*  PLT 250   ------------------------------------------------------------------------------------------------------------------  Chemistries  Recent Labs  Lab 05/23/18 1450  NA 138  K 3.2*  CL 104  CO2 25  GLUCOSE 101*  BUN 24*  CREATININE 0.92  CALCIUM 8.9  AST 18  ALT 14  ALKPHOS 79  BILITOT 0.6   ------------------------------------------------------------------------------------------------------------------  Cardiac Enzymes Recent Labs  Lab 05/23/18 1450  TROPONINI <0.03   ------------------------------------------------------------------------------------------------------------------  RADIOLOGY:  Ct Head Wo Contrast  Result Date: 05/23/2018 CLINICAL DATA:  Syncope EXAM: CT HEAD WITHOUT CONTRAST TECHNIQUE: Contiguous axial images were obtained from the base of the skull through the vertex without intravenous  contrast. COMPARISON:  July 30, 2017 FINDINGS: Brain: Mild diffuse atrophy is stable. There is no intracranial mass, hemorrhage, extra-axial fluid collection, or midline shift. There is patchy small vessel disease in the centra semiovale bilaterally, stable. There is no acute infarct evident. No new gray-white compartment lesion. Vascular: No hyperdense vessel evident. There is calcification in each carotid siphon region. Skull: Bony calvarium appears intact. Sinuses/Orbits: Visualized paranasal sinuses are clear. Visualized orbits appear symmetric bilaterally. Other: Mastoid air cells are clear. IMPRESSION: Atrophy with patchy periventricular small vessel disease. No acute infarct evident. No mass or hemorrhage. There are foci of arterial vascular calcification. Electronically Signed   By: Lowella Grip III M.D.   On: 05/23/2018 17:38   Ct Abdomen Pelvis W Contrast  Result Date: 05/23/2018 CLINICAL DATA:  Flank and abdominal pain.  Diarrhea. EXAM: CT ABDOMEN AND PELVIS WITH CONTRAST TECHNIQUE: Multidetector CT imaging of the abdomen and pelvis was performed using the standard protocol following bolus administration of intravenous contrast. CONTRAST:  163mL ISOVUE-300 IOPAMIDOL (ISOVUE-300) INJECTION 61% COMPARISON:  Feb 24, 2011 FINDINGS: Lower chest: There is mild bibasilar atelectasis. No lung base edema or consolidation. Hepatobiliary: No focal liver lesions are appreciable. Gallbladder is absent. There is intrahepatic and extrahepatic biliary duct dilatation. No biliary duct mass or calculus is appreciable. Pancreas: No pancreatic mass or inflammatory focus is evident. Spleen: No splenic lesions are appreciable. Adrenals/Urinary Tract: Adrenals bilaterally appear normal. Kidneys bilaterally show no evident mass or hydronephrosis on either side. There is no appreciable renal or ureteral calculus on either side. Urinary bladder is midline with wall thickness within normal limits. Stomach/Bowel: There  are multiple sigmoid diverticula. There is wall thickening in the mid the distal sigmoid colon, likely due to chronic muscular hypertrophy from chronic diverticulosis. There is subtle mesenteric thickening along the leftward aspect of the mid sigmoid colon, likely early changes of diverticulitis. No abscess or perforation evident. There is no appreciable bowel wall thickening elsewhere. No bowel obstruction evident. No free air or portal venous air. Vascular/Lymphatic: There is aortic atherosclerosis. There is no evident aneurysm. Major mesenteric arterial vessels appear patent. There is a circumaortic left renal vein, an anatomic variant. There is no appreciable adenopathy in the abdomen or pelvis. Reproductive: Uterus is absent.  No evident pelvic mass. Other: There is mesh in the abdominal wall region anteriorly with scarring but no appreciable aneurysm in the anterior abdominal wall area. There is no appreciable periappendiceal region inflammation. No abscess or ascites is evident in the abdomen or pelvis. Musculoskeletal: There is postoperative change throughout the lower lumbar spine. There is multifocal lower thoracic and lumbar osteoarthritic change. There is osteoarthritic change in each hip joint. No evident blastic or lytic bone lesions. There is no intramuscular or abdominal wall lesion evident. IMPRESSION: 1. Changes of early diverticulitis in the mid  sigmoid colon. Specifically, there is mild mesenteric thickening to the left of the sigmoid colon. Most of the wall thickening in the sigmoid colon is likely due to muscular hypertrophy from chronic diverticulosis. There may be contribution from early diverticulitis, however. No abscess or perforation in this area. 2. No evident bowel obstruction. No abscess in the abdomen pelvis. No periappendiceal region inflammation. 3. Postoperative change anterior abdominal wall with scarring but no hernia. 4. Absent gallbladder. There is intrahepatic and extrahepatic  biliary duct dilatation. No biliary duct mass or calculus evident by CT. 5.  No evident renal or ureteral calculus.  No hydronephrosis. 6.  Aortic atherosclerosis. 7. Multifocal postoperative change in the lower lumbar region. Multilevel arthropathy in the lower thoracic and lumbar regions. 8.  Uterus absent. Aortic Atherosclerosis (ICD10-I70.0). Electronically Signed   By: Lowella Grip III M.D.   On: 05/23/2018 17:34    EKG:   Orders placed or performed during the hospital encounter of 05/23/18  . EKG 12-Lead  . EKG 12-Lead    IMPRESSION AND PLAN:   Akeira Lahm  is a 72 y.o. female with a known history of chronic atrial fibrillation, diabetes mellitus, hypertension, multiple other medical problems is presented to ED with a chief complaint of left lower quadrant abdominal pain which is really severe and also complaining of some back pain.  Patient is reporting left-sided numbness including left facial numbness.  CT head is negative.  CT abdomen has revealed acute diverticulitis.    # Acute LLQ abd pain 2/2 acute diverticulitis Admit to MedSurg unit IV ciprofloxacin and Flagyl IV fluids Pain management as needed  #Left-sided numbness rule out TIA CT head is negative Will get his complete stroke work-up with MRI of the brain carotid Dopplers and 2D echocardiogram Neurochecks Fasting lipid panel, hemoglobin A1c and TSH in a.m. PT OT evaluation N.p.o. until bedside swallow evaluation Allow permissive hypertension Neurology consult  #UTI with abnormal urinalysis Urine culture and sensitivity, IV ciprofloxacin  Hypokalemia replete and recheck in a.m.  #iddm  Hold metformin home medication Low-dose Levemir for basal coverage Insulin sliding scale  DVT prophylaxis  All the records are reviewed and case discussed with ED provider. Management plans discussed with the patient, family and they are in agreement.  CODE STATUS: fc  TOTAL TIME TAKING CARE OF THIS PATIENT: 45   minutes.   Note: This dictation was prepared with Dragon dictation along with smaller phrase technology. Any transcriptional errors that result from this process are unintentional.  Nicholes Mango M.D on 05/23/2018 at 11:06 PM  Between 7am to 6pm - Pager - [phone]  After 6pm go to [email or web address] - password [email or web address] [place] [facility]  Office  [phone]  CC: Primary care physician; Remi Haggard, FNP

## 2018-05-24 ENCOUNTER — Inpatient Hospital Stay: Payer: Medicare Other

## 2018-05-24 ENCOUNTER — Inpatient Hospital Stay (HOSPITAL_COMMUNITY)
Admit: 2018-05-24 | Discharge: 2018-05-24 | Disposition: A | Discharge status: Home / Self Care | Payer: Medicare Other | Attending: Internal Medicine | Admitting: Internal Medicine

## 2018-05-24 DIAGNOSIS — K5792 Diverticulitis of intestine, part unspecified, without perforation or abscess without bleeding: Secondary | ICD-10-CM | POA: Diagnosis not present

## 2018-05-24 DIAGNOSIS — R29810 Facial weakness: Secondary | ICD-10-CM

## 2018-05-24 DIAGNOSIS — R55 Syncope and collapse: Secondary | ICD-10-CM | POA: Diagnosis not present

## 2018-05-24 DIAGNOSIS — I5030 Unspecified diastolic (congestive) heart failure: Secondary | ICD-10-CM

## 2018-05-24 DIAGNOSIS — R202 Paresthesia of skin: Secondary | ICD-10-CM | POA: Diagnosis not present

## 2018-05-24 LAB — GLUCOSE, CAPILLARY
GLUCOSE-CAPILLARY: 115 mg/dL — AB (ref 70–99)
GLUCOSE-CAPILLARY: 89 mg/dL (ref 70–99)
GLUCOSE-CAPILLARY: 114 mg/dL — AB (ref 70–99)
Glucose-Capillary: 99 mg/dL (ref 70–99)

## 2018-05-24 LAB — LIPID PANEL
Cholesterol: 149 mg/dL (ref 0–200)
HDL: 35 mg/dL — ABNORMAL LOW (ref >40)
LDL Cholesterol: 62 mg/dL (ref 0–99)
Total CHOL/HDL Ratio: 4.3 RATIO
Triglycerides: 260 mg/dL — ABNORMAL HIGH (ref <150)
VLDL: 52 mg/dL — ABNORMAL HIGH (ref 0–40)

## 2018-05-24 LAB — COMPREHENSIVE METABOLIC PANEL
ALBUMIN: 3.1 g/dL — AB (ref 3.5–5.0)
ALT: 152 U/L — ABNORMAL HIGH (ref 0–44)
AST: 141 U/L — ABNORMAL HIGH (ref 15–41)
Alkaline Phosphatase: 92 U/L (ref 38–126)
Anion gap: 4 — ABNORMAL LOW (ref 5–15)
BUN: 22 mg/dL (ref 8–23)
CHLORIDE: 106 mmol/L (ref 98–111)
CO2: 29 mmol/L (ref 22–32)
Calcium: 8.5 mg/dL — ABNORMAL LOW (ref 8.9–10.3)
Creatinine, Ser: 0.84 mg/dL (ref 0.44–1.00)
GFR calc Af Amer: >60 mL/min (ref >60)
GFR calc non Af Amer: >60 mL/min (ref >60)
GLUCOSE: 106 mg/dL — AB (ref 70–99)
POTASSIUM: 3.5 mmol/L (ref 3.5–5.1)
Sodium: 139 mmol/L (ref 135–145)
Total Bilirubin: 1.1 mg/dL (ref 0.3–1.2)
Total Protein: 6.5 g/dL (ref 6.5–8.1)

## 2018-05-24 LAB — ECHOCARDIOGRAM COMPLETE
HEIGHTINCHES: 64 in
WEIGHTICAEL: 3361.6 [oz_av]

## 2018-05-24 LAB — CBC
HCT: 32.5 % — ABNORMAL LOW (ref 35.0–47.0)
Hemoglobin: 10.8 g/dL — ABNORMAL LOW (ref 12.0–16.0)
MCH: 25.2 pg — ABNORMAL LOW (ref 26.0–34.0)
MCHC: 33.1 g/dL (ref 32.0–36.0)
MCV: 76.1 fL — ABNORMAL LOW (ref 80.0–100.0)
PLATELETS: 250 10*3/uL (ref 150–440)
RBC: 4.27 MIL/uL (ref 3.80–5.20)
RDW: 17.9 % — AB (ref 11.5–14.5)
WBC: 6.6 10*3/uL (ref 3.6–11.0)

## 2018-05-24 LAB — HEMOGLOBIN A1C
HEMOGLOBIN A1C: 6.1 % — AB (ref 4.8–5.6)
Mean Plasma Glucose: 128.37 mg/dL

## 2018-05-24 LAB — TSH: TSH: 0.838 u[IU]/mL (ref 0.350–4.500)

## 2018-05-24 MED ORDER — METRONIDAZOLE 500 MG PO TABS
500.0000 mg | ORAL_TABLET | Freq: Three times a day (TID) | ORAL | Status: DC
  Administered 2018-05-24 – 2018-05-25 (×2): 500 mg via ORAL
  Filled 2018-05-24 (×2): qty 1

## 2018-05-24 MED ORDER — CIPROFLOXACIN HCL 500 MG PO TABS
500.0000 mg | ORAL_TABLET | Freq: Two times a day (BID) | ORAL | Status: DC
  Administered 2018-05-24 – 2018-05-25 (×2): 500 mg via ORAL
  Filled 2018-05-24 (×2): qty 1

## 2018-05-24 NOTE — Plan of Care (Signed)
  Problem: Spiritual Needs Goal: Ability to function at adequate level Outcome: Progressing   Problem: Education: Goal: Knowledge of General Education information will improve Description Including pain rating scale, medication(s)/side effects and non-pharmacologic comfort measures Outcome: Progressing   Problem: Health Behavior/Discharge Planning: Goal: Ability to manage health-related needs will improve Outcome: Progressing   Problem: Clinical Measurements: Goal: Ability to maintain clinical measurements within normal limits will improve Outcome: Progressing Goal: Will remain free from infection Outcome: Progressing Goal: Diagnostic test results will improve Outcome: Progressing Goal: Respiratory complications will improve Outcome: Progressing Goal: Cardiovascular complication will be avoided Outcome: Progressing   Problem: Activity: Goal: Risk for activity intolerance will decrease Outcome: Progressing   Problem: Nutrition: Goal: Adequate nutrition will be maintained Outcome: Progressing   Problem: Coping: Goal: Level of anxiety will decrease Outcome: Progressing   Problem: Elimination: Goal: Will not experience complications related to bowel motility Outcome: Progressing Goal: Will not experience complications related to urinary retention Outcome: Progressing   Problem: Pain Managment: Goal: General experience of comfort will improve Outcome: Progressing   Problem: Safety: Goal: Ability to remain free from injury will improve Outcome: Progressing   Problem: Skin Integrity: Goal: Risk for impaired skin integrity will decrease Outcome: Progressing   Problem: Education: Goal: Knowledge of secondary prevention will improve Outcome: Progressing Goal: Knowledge of patient specific risk factors addressed and post discharge goals established will improve Outcome: Progressing

## 2018-05-24 NOTE — Progress Notes (Signed)
Chaplain responded to an OR for an AD. Family was at the bedside. Granddaughter, great grandson and Pt friend of 49 years. Chaplain educated Pt and family on AD. Family will discuss and review. Chaplain and Pt related due to the name of her husband was the same as Clinical biochemist. Pt, family and Chaplain shared a laugh regarding that connection. Chaplain let them know if they want to move forward they can page chaplain.    05/24/18 0900  Clinical Encounter Type  Visited With Patient and family together  Visit Type Initial  Referral From Physician  Spiritual Encounters  Spiritual Needs Brochure

## 2018-05-24 NOTE — Progress Notes (Addendum)
[facility] NAME: Marybel Alcott    MR#:  [id number]  DATE OF BIRTH:  [birth year]  SUBJECTIVE:  CHIEF COMPLAINT:   Chief Complaint  Patient presents with  . Near Syncope  feels better  REVIEW OF SYSTEMS:  Review of Systems  Constitutional: Negative for diaphoresis, fever, malaise/fatigue and weight loss.  HENT: Negative for ear discharge, ear pain, hearing loss, nosebleeds, sore throat and tinnitus.   Eyes: Negative for blurred vision and pain.  Respiratory: Negative for cough, hemoptysis, shortness of breath and wheezing.   Cardiovascular: Negative for chest pain, palpitations, orthopnea and leg swelling.  Gastrointestinal: Negative for abdominal pain, blood in stool, constipation, diarrhea, heartburn, nausea and vomiting.  Genitourinary: Negative for dysuria, frequency and urgency.  Musculoskeletal: Negative for back pain and myalgias.  Skin: Negative for itching and rash.  Neurological: Negative for dizziness, tingling, tremors, focal weakness, seizures, weakness and headaches.  Psychiatric/Behavioral: Negative for depression. The patient is not nervous/anxious.     DRUG ALLERGIES:  No Known Allergies VITALS:  Blood pressure (!) 133/57, pulse (!) 55, temperature 98 F (36.7 C), temperature source Oral, resp. rate 18, height 5\' 4"  (1.626 m), weight 95.3 kg, SpO2 100 %. PHYSICAL EXAMINATION:  Physical Exam  Constitutional: She is oriented to person, place, and time.  HENT:  Head: Normocephalic and atraumatic.  Eyes: Pupils are equal, round, and reactive to light. Conjunctivae and EOM are normal.  Neck: Normal range of motion. Neck supple. No tracheal deviation present. No thyromegaly present.  Cardiovascular: Normal rate, regular rhythm and normal heart sounds.  Pulmonary/Chest: Effort normal and breath sounds normal. No respiratory distress. She has no wheezes. She exhibits no tenderness.  Abdominal: Soft. Bowel sounds are  normal. She exhibits no distension. There is no tenderness.  Musculoskeletal: Normal range of motion.  Neurological: She is alert and oriented to person, place, and time. No cranial nerve deficit.  Skin: Skin is warm and dry. No rash noted.   LABORATORY PANEL:  Female CBC Recent Labs  Lab 05/24/18 0506  WBC 6.6  HGB 10.8*  HCT 32.5*  PLT 250   ------------------------------------------------------------------------------------------------------------------ Chemistries  Recent Labs  Lab 05/24/18 0506  NA 139  K 3.5  CL 106  CO2 29  GLUCOSE 106*  BUN 22  CREATININE 0.84  CALCIUM 8.5*  AST 141*  ALT 152*  ALKPHOS 92  BILITOT 1.1   RADIOLOGY:  Dg Chest 2 View  Result Date: 05/24/2018 CLINICAL DATA:  TIA EXAM: CHEST - 2 VIEW COMPARISON:  07/30/2017, CT 11/26/2017 FINDINGS: No significant pleural effusion. Cardiomegaly with vascular congestion. No focal airspace disease. No pneumothorax. IMPRESSION: Cardiomegaly with vascular congestion. Electronically Signed   By: Donavan Foil M.D.   On: 05/24/2018 00:49   Mr Brain Wo Contrast  Result Date: 05/24/2018 CLINICAL DATA:  TIA.  Atrial fibrillation, hypertension, diabetes EXAM: MRI HEAD WITHOUT CONTRAST MRA HEAD WITHOUT CONTRAST TECHNIQUE: Multiplanar, multiecho pulse sequences of the brain and surrounding structures were obtained without intravenous contrast. Angiographic images of the head were obtained using MRA technique without contrast. COMPARISON:  CT head 05/23/2018 FINDINGS: MRI HEAD FINDINGS Brain: Negative for acute infarct. Chronic microvascular ischemic changes in the white matter, moderate in degree. Brainstem and cerebellum normal. Negative for hemorrhage or mass. Ventricle size normal for age. Vascular: Normal arterial flow voids Skull and upper cervical spine: Negative Sinuses/Orbits:  Paranasal sinuses clear. Bilateral cataract surgery Other: None MRA HEAD FINDINGS Left vertebral artery dominant. Small right  vertebral  artery contributes to the basilar. Left PICA patent. Dominant right AICA patent. Basilar widely patent. Fetal origin posterior cerebral artery bilaterally with hypoplastic P1 segments bilaterally. Posterior cerebral arteries are patent bilaterally. Mild stenosis left P2 segment. Internal carotid artery widely patent bilaterally. Tortuosity of the cavernous carotid bilaterally. Anterior and middle cerebral arteries are patent without large vessel occlusion. Mild stenosis at the carotid bifurcation bilaterally extending into M2 segments. IMPRESSION: 1. Negative for acute infarct. Moderate chronic microvascular ischemia in the white matter 2. Mild stenosis left posterior cerebral artery. Mild stenosis middle cerebral artery bifurcation bilaterally. No acute intracranial occlusion. Electronically Signed   By: Franchot Gallo M.D.   On: 05/24/2018 12:45   US Carotid Bilateral (at [facility] Only)  Result Date: 05/24/2018 CLINICAL DATA:  TIA with left-sided numbness. History of hypertension, diabetes and tobacco use. EXAM: BILATERAL CAROTID DUPLEX ULTRASOUND TECHNIQUE: Pearline Cables scale imaging, color Doppler and duplex ultrasound were performed of bilateral carotid and vertebral arteries in the neck. COMPARISON:  None. FINDINGS: Criteria: Quantification of carotid stenosis is based on velocity parameters that correlate the residual internal carotid diameter with NASCET-based stenosis levels, using the diameter of the distal internal carotid lumen as the denominator for stenosis measurement. The following velocity measurements were obtained: RIGHT ICA:  91/19 cm/sec CCA:  52/84 cm/sec SYSTOLIC ICA/CCA RATIO:  1.1 ECA:  89 cm/sec LEFT ICA:  142/35 mid, 68/16 proximal cm/sec CCA:  132/44 cm/sec SYSTOLIC ICA/CCA RATIO:  1.3 ECA:  76 cm/sec RIGHT CAROTID ARTERY: The common carotid artery shows intimal thickening. Distal common carotid artery and the entire internal carotid artery shows significant tortuosity. No focal plaque  identified. Velocities and waveforms are within normal limits. No evidence of right carotid stenosis. RIGHT VERTEBRAL ARTERY: Antegrade flow with normal waveform and velocity. LEFT CAROTID ARTERY: The common carotid artery demonstrates mild intimal thickening. The common carotid and internal carotid arteries are significantly tortuous. No focal plaque is identified. Velocities and waveforms are within normal limits. No evidence of left carotid stenosis. LEFT VERTEBRAL ARTERY: Antegrade flow with normal waveform and velocity. IMPRESSION: No evidence of focal plaque or carotid stenosis bilaterally. Bilateral carotid arteries shows significant tortuosity, likely reflecting longstanding hypertension. Electronically Signed   By: Aletta Edouard M.D.   On: 05/24/2018 16:16   Mr Jodene Nam Head/brain WN Cm  Result Date: 05/24/2018 CLINICAL DATA:  TIA.  Atrial fibrillation, hypertension, diabetes EXAM: MRI HEAD WITHOUT CONTRAST MRA HEAD WITHOUT CONTRAST TECHNIQUE: Multiplanar, multiecho pulse sequences of the brain and surrounding structures were obtained without intravenous contrast. Angiographic images of the head were obtained using MRA technique without contrast. COMPARISON:  CT head 05/23/2018 FINDINGS: MRI HEAD FINDINGS Brain: Negative for acute infarct. Chronic microvascular ischemic changes in the white matter, moderate in degree. Brainstem and cerebellum normal. Negative for hemorrhage or mass. Ventricle size normal for age. Vascular: Normal arterial flow voids Skull and upper cervical spine: Negative Sinuses/Orbits:  Paranasal sinuses clear. Bilateral cataract surgery Other: None MRA HEAD FINDINGS Left vertebral artery dominant. Small right vertebral artery contributes to the basilar. Left PICA patent. Dominant right AICA patent. Basilar widely patent. Fetal origin posterior cerebral artery bilaterally with hypoplastic P1 segments bilaterally. Posterior cerebral arteries are patent bilaterally. Mild stenosis left P2  segment. Internal carotid artery widely patent bilaterally. Tortuosity of the cavernous carotid bilaterally. Anterior and middle cerebral arteries are patent without large vessel occlusion. Mild stenosis at the carotid bifurcation bilaterally extending into M2 segments. IMPRESSION: 1. Negative for acute infarct. Moderate chronic microvascular ischemia  in the white matter 2. Mild stenosis left posterior cerebral artery. Mild stenosis middle cerebral artery bifurcation bilaterally. No acute intracranial occlusion. Electronically Signed   By: Franchot Gallo M.D.   On: 05/24/2018 12:45   ASSESSMENT AND PLAN:  Monquie Fulgham  is a 72 y.o. female with a known history of chronic atrial fibrillation, diabetes mellitus, hypertension, multiple other medical problems is presented to ED with a chief complaint of left lower quadrant abdominal pain which is really severe and also complaining of some back pain.  Patient is reporting left-sided numbness including left facial numbness.  CT head is negative.  CT abdomen has revealed acute diverticulitis.    # Acute LLQ abd pain 2/2 acute diverticulitis - switch to PO ciprofloxacin and Flagyl IV fluids Pain management as needed  #Left-sided numbness rule out TIA All Neuro w/up neg. Could be due to diabetic neuropathy - appreciate Neuro input - await EEG  #UTI with abnormal urinalysis Await Urine culture and sensitivity, ciprofloxacin should cover it  # Hypokalemia repleted  #iddm  Hold metformin home medication Low-dose Levemir for basal coverage Insulin sliding scale DM nurse c/s     All the records are reviewed and case discussed with Care Management/Social Worker. Management plans discussed with the patient, family (daughters at bedside) and they are in agreement.  CODE STATUS: Full Code  TOTAL TIME TAKING CARE OF THIS PATIENT: 35 minutes.   More than 50% of the time was spent in counseling/coordination of care: YES  POSSIBLE D/C IN 1 DAYS,  DEPENDING ON CLINICAL CONDITION.   Max Sane M.D on 05/24/2018 at 6:54 PM  Between 7am to 6pm - Pager - [phone]  After 6pm go to [email or web address] - Proofreader  [facility]  Office  [phone]  CC: Primary care physician; Remi Haggard, FNP  Note: This dictation was prepared with Dragon dictation along with smaller phrase technology. Any transcriptional errors that result from this process are unintentional.

## 2018-05-24 NOTE — Progress Notes (Signed)
eeg completed ° °

## 2018-05-24 NOTE — Progress Notes (Signed)
OT Cancellation Note  Patient Details Name: Lynn Potter MRN: [id number] DOB: [birth year]   Cancelled Treatment:    Reason Eval/Treat Not Completed: Patient at procedure or test/ unavailable. Order received, chart reviewed, pt out of room for testing. Will re-attempt at later date/time as pt is available and medically appropriate.   Jeni Salles, MPH, MS, OTR/L ascom [phone] 05/24/18, 11:20 AM

## 2018-05-24 NOTE — Consult Note (Addendum)
Referring Physician: Nicholes Mango MD    Chief Complaint: Left side numbness, left facial numbness  HPI: Lynn Potter is an 72 y.o. female  Seen in consultation with admitting physician for evaluation of left sided numbness including left facial numbness. She initially presented to the ED with complaints of near syncopal episode, nausea, abdominal pain and back pain. At that point she also reported left side numbness and facial numbness without associated focal neurological symptoms. Family is at bedside who are the other informants today. Per family, she was in her usual state of health while attending church  On 05/23/2018 when she suddenly developed left side numbness including her left face and mouth. She felt very weak and lightheaded and almost passed out. Per family she was staring blankly with witnessed bilateral upper and lower extremity myoclonic jerking movements left greater than right. She does not recall the events but denied LOC. No reports of loss of bowel or bladder control. No injury during the events. Family reports significant history of seizures in her younger sister and mother. She had stroke work up including CT head which was negative. She report that symptoms are now improving with residual numbness on the left side.  Date last known well: Date: 05/23/2018 Time last known well: Unable to determine tPA Given: No: Outside window period  Past Medical History:  Diagnosis Date  . AF (atrial fibrillation) ([place])   . Arthritis   . Asthma   . Chicken pox   . Diabetes mellitus without complication ([place])   . Glaucoma   . Hemorrhoids   . History of hiatal hernia   . Hypertension   . Migraines   . Obesity   . Opiate use 03/16/2017  . Personal history of tobacco use, presenting hazards to health 06/18/2015  . Sleep apnea     Past Surgical History:  Procedure Laterality Date  . ABDOMINAL HYSTERECTOMY    . BACK SURGERY    . carpal tunnell    . CATARACT EXTRACTION    .  CHOLECYSTECTOMY    . COLONOSCOPY WITH ESOPHAGOGASTRODUODENOSCOPY (EGD)    . ESOPHAGOGASTRODUODENOSCOPY (EGD) WITH PROPOFOL N/A 02/16/2017   Procedure: ESOPHAGOGASTRODUODENOSCOPY (EGD) WITH PROPOFOL;  Surgeon: Manya Silvas, MD;  Location: [facility] ENDOSCOPY;  Service: Endoscopy;  Laterality: N/A;  . EYE SURGERY    . JOINT REPLACEMENT    . LUMBAR FUSION    . TOTAL KNEE ARTHROPLASTY    . UPPER ESOPHAGEAL ENDOSCOPIC ULTRASOUND (EUS) N/A 01/03/2016   Procedure: UPPER ESOPHAGEAL ENDOSCOPIC ULTRASOUND (EUS);  Surgeon: Holly Bodily, MD;  Location: [facility] ENDOSCOPY;  Service: Gastroenterology;  Laterality: N/A;    Family History  Problem Relation Age of Onset  . Diabetes Mother   . Diabetes Sister    Social History:  reports that she has been smoking cigarettes. She has been smoking about 1.00 pack per day. She has never used smokeless tobacco. She reports that she does not drink alcohol or use drugs.  Allergies: No Known Allergies  Medications:  I have reviewed the patient's current medications. Scheduled: . enoxaparin (LOVENOX) injection  40 mg Subcutaneous Q24H  . insulin aspart  0-5 Units Subcutaneous QHS  . insulin aspart  0-9 Units Subcutaneous TID WC  . insulin detemir  12 Units Subcutaneous Daily    ROS: History obtained from the patient   General ROS: negative for - chills, fatigue, fever, night sweats, weight gain or weight loss Psychological ROS: negative for - behavioral disorder, hallucinations, memory difficulties, mood swings or suicidal  ideation Ophthalmic ROS: negative for - blurry vision, double vision, eye pain or loss of vision ENT ROS: negative for - epistaxis, nasal discharge, oral lesions, sore throat, tinnitus or vertigo Allergy and Immunology ROS: negative for - hives or itchy/watery eyes Hematological and Lymphatic ROS: negative for - bleeding problems, bruising or swollen lymph nodes Endocrine ROS: negative for - galactorrhea, hair pattern changes,  polydipsia/polyuria or temperature intolerance Respiratory ROS: negative for - cough, hemoptysis, shortness of breath or wheezing Cardiovascular ROS: negative for - chest pain, dyspnea on exertion, edema or irregular heartbeat Gastrointestinal ROS: negative for - Positive for abdominal pain, nausea/vomiting Negative  stool incontinencediarrhea, hematemesis Genito-Urinary ROS: negative for - dysuria, hematuria, incontinence or urinary frequency/urgency Musculoskeletal ROS: negative for - joint swelling or muscular weakness Neurological ROS: as noted in HPI Dermatological ROS: negative for rash and skin lesion changes  Physical Examination: Blood pressure (!) 108/49, pulse (!) 53, temperature 98.2 F (36.8 C), temperature source Oral, resp. rate 18, height 5\' 4"  (1.626 m), weight 95.3 kg, SpO2 95 %.  General Exam Patient looks appropriate of age, well built, nourished and appropriately groomed.  Cardiovascular Exam: S1, S2 heart sounds present Carotid exam revealed no bruit Lung exam was clear to auscultation ? Neurological Exam  Mental Status: Alert, Oriented to time, place, person and situation Attention span and concentration seemed appropriate Memory seemed OK. Intact naming, repetition, comprehension.  Followed 2 step commands - no dysarthria Fund of knowledge seemed appropriate for age and health status.  Cranial Nerves: I. Olfactory not examined II: Visual fields were full. Pupils were equal, round and reactive to light and accommodation III,IV, VI: ptosis not present, extra-ocular motions intact bilaterally V,VII: smile symmetric, facial light touch sensation decreased in the left VIII: Finger rub was heard symmetric in both ears IX, X: Palate and uvular movements are normal and oral sensations are OK, gag reflex deffered XI: Neck muscle strength and shoulder shrug is normal XII: midline tongue extension   Motor Exam: Tone is normal in all extremities Muscle strength  in all extremities is 5/5. Left arm drift without pronation No abnormal movements, fasciculations or atrophy seen  Deep Tendon Reflexes: Right Biceps is 2+, Left Biceps is 2+ Right Triceps is 2+, Left Triceps is 2+ Right Brachioradialis is 2+, Left Brachioradialis is 2+ Right Knee Jerk is 2+, Left Knee Jerk is 2+ Right Ankle Jerk is 2+, Left Ankle Jerk is 2+ Right Toes are down going, Left Toes are down going  Sensory Exam: Sensations were decreased to light touch in the left upper and lower extremities Vibration and proprioception are also intact  Co-ordination: Finger to nose is normal  Gait: Gait and station are normal when examined in small exam room  Data Reviewed  Laboratory Studies:  Basic Metabolic Panel: Recent Labs  Lab 05/23/18 1450 05/24/18 0506  NA 138 139  K 3.2* 3.5  CL 104 106  CO2 25 29  GLUCOSE 101* 106*  BUN 24* 22  CREATININE 0.92 0.84  CALCIUM 8.9 8.5*    Liver Function Tests: Recent Labs  Lab 05/23/18 1450 05/24/18 0506  AST 18 141*  ALT 14 152*  ALKPHOS 79 92  BILITOT 0.6 1.1  PROT 6.7 6.5  ALBUMIN 3.3* 3.1*   Recent Labs  Lab 05/23/18 1450  LIPASE 21   No results for input(s): AMMONIA in the last 168 hours.  CBC: Recent Labs  Lab 05/23/18 1450 05/24/18 0506  WBC 9.8 6.6  NEUTROABS 5.4  --   HGB  11.4* 10.8*  HCT 34.8* 32.5*  MCV 77.2* 76.1*  PLT 250 250    Cardiac Enzymes: Recent Labs  Lab 05/23/18 1450  TROPONINI <0.03    BNP: Invalid input(s): POCBNP  CBG: Recent Labs  Lab 05/23/18 2345  GLUCAP 99    Microbiology: Results for orders placed or performed during the hospital encounter of 07/28/17  MRSA PCR Screening     Status: None   Collection Time: 07/28/17  6:59 PM  Result Value Ref Range Status   MRSA by PCR NEGATIVE NEGATIVE Final    Comment:        The GeneXpert MRSA Assay (FDA approved for NASAL specimens only), is one component of a comprehensive MRSA colonization surveillance program.  It is not intended to diagnose MRSA infection nor to guide or monitor treatment for MRSA infections.     Coagulation Studies: No results for input(s): LABPROT, INR in the last 72 hours.  Urinalysis:  Recent Labs  Lab 05/23/18 1450  COLORURINE YELLOW*  LABSPEC >1.046*  PHURINE 5.0  GLUCOSEU >=500*  HGBUR SMALL*  BILIRUBINUR NEGATIVE  KETONESUR NEGATIVE  PROTEINUR NEGATIVE  NITRITE NEGATIVE  LEUKOCYTESUR SMALL*    Lipid Panel:    Component Value Date/Time   CHOL 149 05/24/2018 0506   TRIG 260 (H) 05/24/2018 0506   HDL 35 (L) 05/24/2018 0506   CHOLHDL 4.3 05/24/2018 0506   VLDL 52 (H) 05/24/2018 0506   LDLCALC 62 05/24/2018 0506    HgbA1C:  Lab Results  Component Value Date   HGBA1C 12.3 (H) 07/28/2017    Urine Drug Screen:  No results found for: LABOPIA, COCAINSCRNUR, LABBENZ, AMPHETMU, THCU, LABBARB  Alcohol Level: No results for input(s): ETH in the last 168 hours.  Other results: EKG: normal EKG, normal sinus rhythm, nonspecific ST and T waves changes, sinus bradycardia.  Imaging: Dg Chest 2 View  Result Date: 05/24/2018 CLINICAL DATA:  TIA EXAM: CHEST - 2 VIEW COMPARISON:  07/30/2017, CT 11/26/2017 FINDINGS: No significant pleural effusion. Cardiomegaly with vascular congestion. No focal airspace disease. No pneumothorax. IMPRESSION: Cardiomegaly with vascular congestion. Electronically Signed   By: Donavan Foil M.D.   On: 05/24/2018 00:49   Ct Head Wo Contrast  Result Date: 05/23/2018 CLINICAL DATA:  Syncope EXAM: CT HEAD WITHOUT CONTRAST TECHNIQUE: Contiguous axial images were obtained from the base of the skull through the vertex without intravenous contrast. COMPARISON:  July 30, 2017 FINDINGS: Brain: Mild diffuse atrophy is stable. There is no intracranial mass, hemorrhage, extra-axial fluid collection, or midline shift. There is patchy small vessel disease in the centra semiovale bilaterally, stable. There is no acute infarct evident. No new  gray-white compartment lesion. Vascular: No hyperdense vessel evident. There is calcification in each carotid siphon region. Skull: Bony calvarium appears intact. Sinuses/Orbits: Visualized paranasal sinuses are clear. Visualized orbits appear symmetric bilaterally. Other: Mastoid air cells are clear. IMPRESSION: Atrophy with patchy periventricular small vessel disease. No acute infarct evident. No mass or hemorrhage. There are foci of arterial vascular calcification. Electronically Signed   By: Lowella Grip III M.D.   On: 05/23/2018 17:38   Ct Abdomen Pelvis W Contrast  Result Date: 05/23/2018 CLINICAL DATA:  Flank and abdominal pain.  Diarrhea. EXAM: CT ABDOMEN AND PELVIS WITH CONTRAST TECHNIQUE: Multidetector CT imaging of the abdomen and pelvis was performed using the standard protocol following bolus administration of intravenous contrast. CONTRAST:  152mL ISOVUE-300 IOPAMIDOL (ISOVUE-300) INJECTION 61% COMPARISON:  Feb 24, 2011 FINDINGS: Lower chest: There is mild bibasilar atelectasis. No lung  base edema or consolidation. Hepatobiliary: No focal liver lesions are appreciable. Gallbladder is absent. There is intrahepatic and extrahepatic biliary duct dilatation. No biliary duct mass or calculus is appreciable. Pancreas: No pancreatic mass or inflammatory focus is evident. Spleen: No splenic lesions are appreciable. Adrenals/Urinary Tract: Adrenals bilaterally appear normal. Kidneys bilaterally show no evident mass or hydronephrosis on either side. There is no appreciable renal or ureteral calculus on either side. Urinary bladder is midline with wall thickness within normal limits. Stomach/Bowel: There are multiple sigmoid diverticula. There is wall thickening in the mid the distal sigmoid colon, likely due to chronic muscular hypertrophy from chronic diverticulosis. There is subtle mesenteric thickening along the leftward aspect of the mid sigmoid colon, likely early changes of diverticulitis. No  abscess or perforation evident. There is no appreciable bowel wall thickening elsewhere. No bowel obstruction evident. No free air or portal venous air. Vascular/Lymphatic: There is aortic atherosclerosis. There is no evident aneurysm. Major mesenteric arterial vessels appear patent. There is a circumaortic left renal vein, an anatomic variant. There is no appreciable adenopathy in the abdomen or pelvis. Reproductive: Uterus is absent.  No evident pelvic mass. Other: There is mesh in the abdominal wall region anteriorly with scarring but no appreciable aneurysm in the anterior abdominal wall area. There is no appreciable periappendiceal region inflammation. No abscess or ascites is evident in the abdomen or pelvis. Musculoskeletal: There is postoperative change throughout the lower lumbar spine. There is multifocal lower thoracic and lumbar osteoarthritic change. There is osteoarthritic change in each hip joint. No evident blastic or lytic bone lesions. There is no intramuscular or abdominal wall lesion evident. IMPRESSION: 1. Changes of early diverticulitis in the mid sigmoid colon. Specifically, there is mild mesenteric thickening to the left of the sigmoid colon. Most of the wall thickening in the sigmoid colon is likely due to muscular hypertrophy from chronic diverticulosis. There may be contribution from early diverticulitis, however. No abscess or perforation in this area. 2. No evident bowel obstruction. No abscess in the abdomen pelvis. No periappendiceal region inflammation. 3. Postoperative change anterior abdominal wall with scarring but no hernia. 4. Absent gallbladder. There is intrahepatic and extrahepatic biliary duct dilatation. No biliary duct mass or calculus evident by CT. 5.  No evident renal or ureteral calculus.  No hydronephrosis. 6.  Aortic atherosclerosis. 7. Multifocal postoperative change in the lower lumbar region. Multilevel arthropathy in the lower thoracic and lumbar regions. 8.   Uterus absent. Aortic Atherosclerosis (ICD10-I70.0). Electronically Signed   By: Lowella Grip III M.D.   On: 05/23/2018 17:34    Assessment: 72 y.o. female presenting with focal symptoms of left side numbness including facial numbness concerning for CVA with negative CT head which was personally reviewed. Echocardiogram showed no cardiac source of emboli. Currently pending further work up as below. She has history of atrial fibrillation currently not on anticoagulation.  Stroke Risk Factors - atrial fibrillation, diabetes mellitus, hyperlipidemia and hypertension  Plan: 1. HgbA1c pending 2. MRI, MRA  of the brain pending 3. PT consult, OT consult, Speech consult 4. Carotid dopplers pending 5. Will obtain EEG 6. Recommend aggressive medical management with dual therapy Aspirin 81 mg/day and Plavix 75 mg /day with intensive management of vascular risk factor to keep systolic BP (SBP) <478 mm Hg (130 mm Hg if diabetic) and low density lipoprotein (LDL) <70 mg/dl, and lifestyle modification. Continue high potency statin. 7. NPO until RN stroke swallow screen 8. Telemetry monitoring 9. Frequent neuro checks 10. Recommend follow  with cardiology for atrial fibrillation management  This patient was staffed with Dr. Irish Elders, Alease Frame who personally evaluated patient, reviewed documentation and agreed with assessment and plan of care as above.  Rufina Falco, DNP, FNP-BC Board certified Nurse Practitioner Neurology Service

## 2018-05-24 NOTE — Progress Notes (Signed)
SLP Cancellation Note  Patient Details Name: Lynn Potter MRN: [id number] DOB: 11/07/1945   Cancelled treatment:       Reason Eval/Treat Not Completed: SLP screened, no needs identified, will sign off(chart reviewed; consulted NSG then met w/ pt). Pt denied any difficulty swallowing and is currently on a regular diet; tolerates swallowing pills w/ water per NSG. Pt conversed at conversational level w/out deficits noted; pt denied any speech-language deficits.  No further skilled ST services indicated as pt appears at her baseline. Pt agreed. NSG to reconsult if any change in status.     Katherine Watson, MS, CCC-SLP Watson,Katherine 05/24/2018, 9:39 AM   

## 2018-05-24 NOTE — Progress Notes (Signed)
Pt refused cpap. States she no longer uses at home & doesn't wish to use it now.

## 2018-05-24 NOTE — Progress Notes (Signed)
PT Cancellation Note  Patient Details Name: Lynn Potter MRN: [id number] DOB: 12/02/1945   Cancelled Treatment:    Reason Eval/Treat Not Completed: (Consult received and chart reivewed.  Patient currently eating lunch.  Will re-attempt at later date/time as medically appropriate and available.)   [name] H. Owens Shark, PT, DPT, NCS 05/24/18, 2:19 PM [phone]

## 2018-05-24 NOTE — Evaluation (Signed)
Physical Therapy Evaluation Patient Details Name: Lynn Potter MRN: [id number] DOB: 03-Jun-1946 Today's Date: 05/24/2018   History of Present Illness  presented to ER secondary to lower abdominal pain, L UE/facial numbness; admitted for acute diverticulitis.  MRI head negative for acute infarct; patient reporting numbness now returned to baseline.  Clinical Impression  Upon evaluation, patient alert and oriented; follows commands and demonstrates fair/good effort with activities.  Continues to endorse mild paresthesia in L UE/LE (see details below), but reports areas still present at baseline for her (initial symptoms resolved).  Mild give-way weakness noted with isolated testing to L LE; functionally, strength appears globally symmetrical and WFL throughout bilat LEs.  Able to complete bed mobility with mod indep; sit/stand, basic transfers and gait (80') with RW, cga/close sup.  Slow but steady without buckling or LOB. Does require multiple attempts to complete sit/stand, but improves with repetition.  Decreased speed of 5x sit/stand testing; good use of compensatory/safety strategies. Would benefit from skilled PT to address above deficits and promote optimal return to PLOF; will maintain on caseload throughout remaining hospitalization to ensure continued mobility.  Do not anticipate formal PT needs at discharge, as patient appears at/near baseline for all functional activities (patient voices agreement).    Follow Up Recommendations No PT follow up    Equipment Recommendations       Recommendations for Other Services       Precautions / Restrictions Precautions Precautions: Fall Restrictions Weight Bearing Restrictions: No      Mobility  Bed Mobility Overal bed mobility: Modified Independent                Transfers Overall transfer level: Needs assistance Equipment used: Rolling walker (2 wheeled);None Transfers: Sit to/from Stand Sit to Stand: Supervision;Min guard          General transfer comment: performed with and without RW, cga/close sup.  Multiple attempts required to complete initially, improved with repetition.  Ambulation/Gait Ambulation/Gait assistance: Min guard;Supervision Gait Distance (Feet): 80 Feet Assistive device: Rolling walker (2 wheeled)       General Gait Details: reciprocal stepping with mildly excessive weight shift to L LE, but no overt buckling or LOB.  Patient appears generally comfortable and confident in abilities (limited by fatigue per her report); feels mobility is at/near baseline for her.  Stairs            Wheelchair Mobility    Modified Rankin (Stroke Patients Only)       Balance Overall balance assessment: Needs assistance Sitting-balance support: No upper extremity supported;Feet supported Sitting balance-Leahy Scale: Good     Standing balance support: No upper extremity supported Standing balance-Leahy Scale: Fair                               Pertinent Vitals/Pain Pain Assessment: No/denies pain    Home Living Family/patient expects to be discharged to:: Private residence Living Arrangements: Alone   Type of Home: Apartment Home Access: Level entry     Home Layout: One level Home Equipment: Cane - single point;Walker - 4 wheels Additional Comments: Lives in 2nd story apartment, elevator access    Prior Function Level of Independence: Independent with assistive device(s)         Comments: Mod I with amb SPC or rollator in the home and with rollator in community, no fall history, Ind with ADLs     Hand Dominance   Dominant Hand: Right  Extremity/Trunk Assessment   Upper Extremity Assessment Upper Extremity Assessment: Overall WFL for tasks assessed(endorses mild paresthesia mid-humerus through mid forearm, lateral aspect)    Lower Extremity Assessment Lower Extremity Assessment: (endorses mild paresthesia L knee distally, baseline for patient. Mild  weakness to L LE, though some degree of give-way weakness noted on exam.  Functionally, strength apperas grossly WFL and symmetrical)       Communication   Communication: No difficulties  Cognition Arousal/Alertness: Awake/alert Behavior During Therapy: WFL for tasks assessed/performed Overall Cognitive Status: Within Functional Limits for tasks assessed                                        General Comments      Exercises Other Exercises Other Exercises: Toilet transfer, SPT without assist device, cga/close sup; manages hygiene (using L UE) indep without difficulty. Other Exercises: 5x sit/stand without assist device, 30 seconds-indicative of decreased LE strength/power; patient with good use of compensatory strategies.   Assessment/Plan    PT Assessment Patient needs continued PT services  PT Problem List Decreased strength;Decreased activity tolerance;Decreased balance;Decreased mobility       PT Treatment Interventions DME instruction;Therapeutic exercise;Gait training;Functional mobility training;Therapeutic activities;Balance training;Patient/family education    PT Goals (Current goals can be found in the Care Plan section)  Acute Rehab PT Goals Patient Stated Goal: to take a little rest PT Goal Formulation: With patient Time For Goal Achievement: 06/07/18 Potential to Achieve Goals: Good    Frequency Min 2X/week   Barriers to discharge Decreased caregiver support      Co-evaluation               AM-PAC PT "6 Clicks" Daily Activity  Outcome Measure Difficulty turning over in bed (including adjusting bedclothes, sheets and blankets)?: None Difficulty moving from lying on back to sitting on the side of the bed? : None Difficulty sitting down on and standing up from a chair with arms (e.g., wheelchair, bedside commode, etc,.)?: Unable Help needed moving to and from a bed to chair (including a wheelchair)?: A Little Help needed walking in  hospital room?: A Little Help needed climbing 3-5 steps with a railing? : A Little 6 Click Score: 18    End of Session Equipment Utilized During Treatment: Gait belt Activity Tolerance: Patient tolerated treatment well Patient left: in bed;with bed alarm set;with call bell/phone within reach Nurse Communication: Mobility status PT Visit Diagnosis: Muscle weakness (generalized) (M62.81);Difficulty in walking, not elsewhere classified (R26.2)    Time: 4536-4680 PT Time Calculation (min) (ACUTE ONLY): 30 min   Charges:   PT Evaluation $PT Eval Low Complexity: 1 Low PT Treatments $Therapeutic Activity: 8-22 mins       Sanari Offner H. Owens Shark, PT, DPT, NCS 05/24/18, 4:45 PM [phone]

## 2018-05-24 NOTE — Progress Notes (Signed)
*  PRELIMINARY RESULTS* Echocardiogram 2D Echocardiogram has been performed.  Sherrie Sport 05/24/2018, 9:36 AM

## 2018-05-24 NOTE — Procedures (Addendum)
Date of recording 05/23/2018  Referring physician Manuella Ghazi, V  Reason for the study Syncope  Technical Digital EEG recording using 10-20 international electrode system  Description of the recording Posterior dominant rhythm is between 8 to 8.5 Hz symmetrical and reactive Mild slowing during drowsiness Sleep architecture was not seen. Epileptiform features were not seen.  Impression The EEG is normal the patient recorded in awake and drowsy state only.

## 2018-05-25 DIAGNOSIS — K5792 Diverticulitis of intestine, part unspecified, without perforation or abscess without bleeding: Secondary | ICD-10-CM | POA: Diagnosis not present

## 2018-05-25 DIAGNOSIS — R29810 Facial weakness: Secondary | ICD-10-CM | POA: Diagnosis not present

## 2018-05-25 DIAGNOSIS — R202 Paresthesia of skin: Secondary | ICD-10-CM | POA: Diagnosis not present

## 2018-05-25 LAB — BASIC METABOLIC PANEL
Anion gap: 4 — ABNORMAL LOW (ref 5–15)
BUN: 16 mg/dL (ref 8–23)
CALCIUM: 8.1 mg/dL — AB (ref 8.9–10.3)
CHLORIDE: 110 mmol/L (ref 98–111)
CO2: 26 mmol/L (ref 22–32)
CREATININE: 0.75 mg/dL (ref 0.44–1.00)
GFR calc Af Amer: >60 mL/min (ref >60)
GFR calc non Af Amer: >60 mL/min (ref >60)
GLUCOSE: 95 mg/dL (ref 70–99)
Potassium: 4 mmol/L (ref 3.5–5.1)
Sodium: 140 mmol/L (ref 135–145)

## 2018-05-25 LAB — CBC
HEMATOCRIT: 32.1 % — AB (ref 35.0–47.0)
Hemoglobin: 10.7 g/dL — ABNORMAL LOW (ref 12.0–16.0)
MCH: 25.5 pg — AB (ref 26.0–34.0)
MCHC: 33.3 g/dL (ref 32.0–36.0)
MCV: 76.6 fL — AB (ref 80.0–100.0)
Platelets: 232 10*3/uL (ref 150–440)
RBC: 4.19 MIL/uL (ref 3.80–5.20)
RDW: 17.6 % — AB (ref 11.5–14.5)
WBC: 6.7 10*3/uL (ref 3.6–11.0)

## 2018-05-25 LAB — GLUCOSE, CAPILLARY
Glucose-Capillary: 106 mg/dL — ABNORMAL HIGH (ref 70–99)
Glucose-Capillary: 99 mg/dL (ref 70–99)

## 2018-05-25 LAB — URINE CULTURE: Special Requests: NORMAL

## 2018-05-25 MED ORDER — CIPROFLOXACIN HCL 500 MG PO TABS: 500.0000 mg | ORAL_TABLET | Freq: Two times a day (BID) | ORAL | 0 refills | Status: AC

## 2018-05-25 MED ORDER — ATORVASTATIN CALCIUM 40 MG PO TABS: 40.0000 mg | ORAL_TABLET | Freq: Every day | ORAL | 0 refills | Status: DC

## 2018-05-25 MED ORDER — METRONIDAZOLE 500 MG PO TABS: 500.0000 mg | ORAL_TABLET | Freq: Three times a day (TID) | ORAL | 0 refills | Status: AC

## 2018-05-25 NOTE — Progress Notes (Signed)
Subjective: Patient up in chair this morning. She state that left arm and facial  numbness is resolving  But she still has some numbness and tingling sensation in her bilateral feet and legs that "comes and goes". She has been up with PT walking without any difficulty.  Objective: Current vital signs: BP (!) 157/77 (BP Location: Left Arm)   Pulse 62   Temp 98 F (36.7 C) (Oral)   Resp 18   Ht 5\' 4"  (1.626 m)   Wt 95.3 kg   SpO2 99%   BMI 36.06 kg/m  Vital signs in last 24 hours: Temp:  [98 F (36.7 C)-98.6 F (37 C)] 98 F (36.7 C) (08/20 0559) Pulse Rate:  [53-62] 62 (08/20 0559) Resp:  [18-20] 18 (08/20 0559) BP: (115-157)/(57-77) 157/77 (08/20 0559) SpO2:  [98 %-100 %] 99 % (08/20 0559)  Intake/Output from previous day: 08/19 0701 - 08/20 0700 In: 2741.3 [P.O.:480; I.V.:1676.3; IV Piggyback:585] Out: -  Intake/Output this shift: Total I/O In: 240 [P.O.:240] Out: -  Nutritional status:  Diet Order            Diet - low sodium heart healthy        Diet heart healthy/carb modified Room service appropriate? Yes; Fluid consistency: Thin  Diet effective now             Physical Exam   Vitals Blood pressure (!) 157/77, pulse 62, temperature 98 F (36.7 C), temperature source Oral, resp. rate 18, height 5\' 4"  (1.626 m), weight 95.3 kg, SpO2 99 %.   General Exam . Patient looks appropriate of age, well built, nourished and appropriately groomed.  . Cardiovascular Exam: S1, S2 heart sounds present  . Carotid exam revealed no bruit  . Lung exam was clear to auscultation  .  Neurological Exam . Alert,  . Oriented to time, place, person  . Attention span and concentration seemed appropriate  . Language seemed intact (naming, spontaneous speech, comprehension)  . I. Olfactory not examined . II: Visual fields were full. Pupils were equal, round and reactive to light and accommodation . III,IV, VI: ptosis not present, extra-ocular motions intact  bilaterally . V,VII: smile symmetric, facial light touch sensation normal bilaterally . VIII: Finger rub was heard symmetric in both ears . IX, X: Palate and uvular movements are normal and oral sensations are OK, gag reflex deffered . XI: Neck muscle strength and shoulder shrug is normal . XII: midline tongue extension . Tone is normal in all extremities, no abnormal movements seen  . Muscle strength in all extremities seemed normal.  . Deep tendon reflexes were symmetric  . Sensations were decreased to light touch in all extremities left worse than right . Gait and station are normal when examined in small exam room   Lab Results: Basic Metabolic Panel: Recent Labs  Lab 05/23/18 1450 05/24/18 0506 05/25/18 0455  NA 138 139 140  K 3.2* 3.5 4.0  CL 104 106 110  CO2 25 29 26   GLUCOSE 101* 106* 95  BUN 24* 22 16  CREATININE 0.92 0.84 0.75  CALCIUM 8.9 8.5* 8.1*    Liver Function Tests: Recent Labs  Lab 05/23/18 1450 05/24/18 0506  AST 18 141*  ALT 14 152*  ALKPHOS 79 92  BILITOT 0.6 1.1  PROT 6.7 6.5  ALBUMIN 3.3* 3.1*   Recent Labs  Lab 05/23/18 1450  LIPASE 21   No results for input(s): AMMONIA in the last 168 hours.  CBC: Recent Labs  Lab 05/23/18  1450 05/24/18 0506 05/25/18 0455  WBC 9.8 6.6 6.7  NEUTROABS 5.4  --   --   HGB 11.4* 10.8* 10.7*  HCT 34.8* 32.5* 32.1*  MCV 77.2* 76.1* 76.6*  PLT 250 250 232    Cardiac Enzymes: Recent Labs  Lab 05/23/18 1450  TROPONINI <0.03    Lipid Panel: Recent Labs  Lab 05/24/18 0506  CHOL 149  TRIG 260*  HDL 35*  CHOLHDL 4.3  VLDL 52*  LDLCALC 62    CBG: Recent Labs  Lab 05/24/18 0747 05/24/18 1254 05/24/18 1745 05/24/18 2028 05/25/18 0741  GLUCAP 115* 89 99 114* 106*    Microbiology: Results for orders placed or performed during the hospital encounter of 05/23/18  Urine Culture     Status: Abnormal   Collection Time: 05/23/18  2:50 PM  Result Value Ref Range Status   Specimen  Description   Final    URINE, CLEAN CATCH Performed at [facility], [address], [place], [place] [address]    Special Requests   Final    Normal Performed at [facility], [address] [place], [place] [address]    Culture MULTIPLE SPECIES PRESENT, SUGGEST RECOLLECTION (A)  Final   Report Status 05/25/2018 FINAL  Final    Coagulation Studies: No results for input(s): LABPROT, INR in the last 72 hours.  Imaging: Dg Chest 2 View  Result Date: 05/24/2018 CLINICAL DATA:  TIA EXAM: CHEST - 2 VIEW COMPARISON:  07/30/2017, CT 11/26/2017 FINDINGS: No significant pleural effusion. Cardiomegaly with vascular congestion. No focal airspace disease. No pneumothorax. IMPRESSION: Cardiomegaly with vascular congestion. Electronically Signed   By: Donavan Foil M.D.   On: 05/24/2018 00:49   Ct Head Wo Contrast  Result Date: 05/23/2018 CLINICAL DATA:  Syncope EXAM: CT HEAD WITHOUT CONTRAST TECHNIQUE: Contiguous axial images were obtained from the base of the skull through the vertex without intravenous contrast. COMPARISON:  July 30, 2017 FINDINGS: Brain: Mild diffuse atrophy is stable. There is no intracranial mass, hemorrhage, extra-axial fluid collection, or midline shift. There is patchy small vessel disease in the centra semiovale bilaterally, stable. There is no acute infarct evident. No new gray-white compartment lesion. Vascular: No hyperdense vessel evident. There is calcification in each carotid siphon region. Skull: Bony calvarium appears intact. Sinuses/Orbits: Visualized paranasal sinuses are clear. Visualized orbits appear symmetric bilaterally. Other: Mastoid air cells are clear. IMPRESSION: Atrophy with patchy periventricular small vessel disease. No acute infarct evident. No mass or hemorrhage. There are foci of arterial vascular calcification. Electronically Signed   By: Lowella Grip III M.D.   On: 05/23/2018 17:38   Mr Brain Wo Contrast  Result Date:  05/24/2018 CLINICAL DATA:  TIA.  Atrial fibrillation, hypertension, diabetes EXAM: MRI HEAD WITHOUT CONTRAST MRA HEAD WITHOUT CONTRAST TECHNIQUE: Multiplanar, multiecho pulse sequences of the brain and surrounding structures were obtained without intravenous contrast. Angiographic images of the head were obtained using MRA technique without contrast. COMPARISON:  CT head 05/23/2018 FINDINGS: MRI HEAD FINDINGS Brain: Negative for acute infarct. Chronic microvascular ischemic changes in the white matter, moderate in degree. Brainstem and cerebellum normal. Negative for hemorrhage or mass. Ventricle size normal for age. Vascular: Normal arterial flow voids Skull and upper cervical spine: Negative Sinuses/Orbits:  Paranasal sinuses clear. Bilateral cataract surgery Other: None MRA HEAD FINDINGS Left vertebral artery dominant. Small right vertebral artery contributes to the basilar. Left PICA patent. Dominant right AICA patent. Basilar widely patent. Fetal origin posterior cerebral artery bilaterally with hypoplastic P1 segments bilaterally.  Posterior cerebral arteries are patent bilaterally. Mild stenosis left P2 segment. Internal carotid artery widely patent bilaterally. Tortuosity of the cavernous carotid bilaterally. Anterior and middle cerebral arteries are patent without large vessel occlusion. Mild stenosis at the carotid bifurcation bilaterally extending into M2 segments. IMPRESSION: 1. Negative for acute infarct. Moderate chronic microvascular ischemia in the white matter 2. Mild stenosis left posterior cerebral artery. Mild stenosis middle cerebral artery bifurcation bilaterally. No acute intracranial occlusion. Electronically Signed   By: Franchot Gallo M.D.   On: 05/24/2018 12:45   Ct Abdomen Pelvis W Contrast  Result Date: 05/23/2018 CLINICAL DATA:  Flank and abdominal pain.  Diarrhea. EXAM: CT ABDOMEN AND PELVIS WITH CONTRAST TECHNIQUE: Multidetector CT imaging of the abdomen and pelvis was performed  using the standard protocol following bolus administration of intravenous contrast. CONTRAST:  164mL ISOVUE-300 IOPAMIDOL (ISOVUE-300) INJECTION 61% COMPARISON:  Feb 24, 2011 FINDINGS: Lower chest: There is mild bibasilar atelectasis. No lung base edema or consolidation. Hepatobiliary: No focal liver lesions are appreciable. Gallbladder is absent. There is intrahepatic and extrahepatic biliary duct dilatation. No biliary duct mass or calculus is appreciable. Pancreas: No pancreatic mass or inflammatory focus is evident. Spleen: No splenic lesions are appreciable. Adrenals/Urinary Tract: Adrenals bilaterally appear normal. Kidneys bilaterally show no evident mass or hydronephrosis on either side. There is no appreciable renal or ureteral calculus on either side. Urinary bladder is midline with wall thickness within normal limits. Stomach/Bowel: There are multiple sigmoid diverticula. There is wall thickening in the mid the distal sigmoid colon, likely due to chronic muscular hypertrophy from chronic diverticulosis. There is subtle mesenteric thickening along the leftward aspect of the mid sigmoid colon, likely early changes of diverticulitis. No abscess or perforation evident. There is no appreciable bowel wall thickening elsewhere. No bowel obstruction evident. No free air or portal venous air. Vascular/Lymphatic: There is aortic atherosclerosis. There is no evident aneurysm. Major mesenteric arterial vessels appear patent. There is a circumaortic left renal vein, an anatomic variant. There is no appreciable adenopathy in the abdomen or pelvis. Reproductive: Uterus is absent.  No evident pelvic mass. Other: There is mesh in the abdominal wall region anteriorly with scarring but no appreciable aneurysm in the anterior abdominal wall area. There is no appreciable periappendiceal region inflammation. No abscess or ascites is evident in the abdomen or pelvis. Musculoskeletal: There is postoperative change throughout the  lower lumbar spine. There is multifocal lower thoracic and lumbar osteoarthritic change. There is osteoarthritic change in each hip joint. No evident blastic or lytic bone lesions. There is no intramuscular or abdominal wall lesion evident. IMPRESSION: 1. Changes of early diverticulitis in the mid sigmoid colon. Specifically, there is mild mesenteric thickening to the left of the sigmoid colon. Most of the wall thickening in the sigmoid colon is likely due to muscular hypertrophy from chronic diverticulosis. There may be contribution from early diverticulitis, however. No abscess or perforation in this area. 2. No evident bowel obstruction. No abscess in the abdomen pelvis. No periappendiceal region inflammation. 3. Postoperative change anterior abdominal wall with scarring but no hernia. 4. Absent gallbladder. There is intrahepatic and extrahepatic biliary duct dilatation. No biliary duct mass or calculus evident by CT. 5.  No evident renal or ureteral calculus.  No hydronephrosis. 6.  Aortic atherosclerosis. 7. Multifocal postoperative change in the lower lumbar region. Multilevel arthropathy in the lower thoracic and lumbar regions. 8.  Uterus absent. Aortic Atherosclerosis (ICD10-I70.0). Electronically Signed   By: Lowella Grip III M.D.   On:  05/23/2018 17:34   US Carotid Bilateral (at [facility] Only)  Result Date: 05/24/2018 CLINICAL DATA:  TIA with left-sided numbness. History of hypertension, diabetes and tobacco use. EXAM: BILATERAL CAROTID DUPLEX ULTRASOUND TECHNIQUE: Pearline Cables scale imaging, color Doppler and duplex ultrasound were performed of bilateral carotid and vertebral arteries in the neck. COMPARISON:  None. FINDINGS: Criteria: Quantification of carotid stenosis is based on velocity parameters that correlate the residual internal carotid diameter with NASCET-based stenosis levels, using the diameter of the distal internal carotid lumen as the denominator for stenosis measurement. The  following velocity measurements were obtained: RIGHT ICA:  91/19 cm/sec CCA:  53/97 cm/sec SYSTOLIC ICA/CCA RATIO:  1.1 ECA:  89 cm/sec LEFT ICA:  142/35 mid, 68/16 proximal cm/sec CCA:  673/41 cm/sec SYSTOLIC ICA/CCA RATIO:  1.3 ECA:  76 cm/sec RIGHT CAROTID ARTERY: The common carotid artery shows intimal thickening. Distal common carotid artery and the entire internal carotid artery shows significant tortuosity. No focal plaque identified. Velocities and waveforms are within normal limits. No evidence of right carotid stenosis. RIGHT VERTEBRAL ARTERY: Antegrade flow with normal waveform and velocity. LEFT CAROTID ARTERY: The common carotid artery demonstrates mild intimal thickening. The common carotid and internal carotid arteries are significantly tortuous. No focal plaque is identified. Velocities and waveforms are within normal limits. No evidence of left carotid stenosis. LEFT VERTEBRAL ARTERY: Antegrade flow with normal waveform and velocity. IMPRESSION: No evidence of focal plaque or carotid stenosis bilaterally. Bilateral carotid arteries shows significant tortuosity, likely reflecting longstanding hypertension. Electronically Signed   By: Aletta Edouard M.D.   On: 05/24/2018 16:16   Mr Jodene Nam Head/brain PF Cm  Result Date: 05/24/2018 CLINICAL DATA:  TIA.  Atrial fibrillation, hypertension, diabetes EXAM: MRI HEAD WITHOUT CONTRAST MRA HEAD WITHOUT CONTRAST TECHNIQUE: Multiplanar, multiecho pulse sequences of the brain and surrounding structures were obtained without intravenous contrast. Angiographic images of the head were obtained using MRA technique without contrast. COMPARISON:  CT head 05/23/2018 FINDINGS: MRI HEAD FINDINGS Brain: Negative for acute infarct. Chronic microvascular ischemic changes in the white matter, moderate in degree. Brainstem and cerebellum normal. Negative for hemorrhage or mass. Ventricle size normal for age. Vascular: Normal arterial flow voids Skull and upper cervical spine:  Negative Sinuses/Orbits:  Paranasal sinuses clear. Bilateral cataract surgery Other: None MRA HEAD FINDINGS Left vertebral artery dominant. Small right vertebral artery contributes to the basilar. Left PICA patent. Dominant right AICA patent. Basilar widely patent. Fetal origin posterior cerebral artery bilaterally with hypoplastic P1 segments bilaterally. Posterior cerebral arteries are patent bilaterally. Mild stenosis left P2 segment. Internal carotid artery widely patent bilaterally. Tortuosity of the cavernous carotid bilaterally. Anterior and middle cerebral arteries are patent without large vessel occlusion. Mild stenosis at the carotid bifurcation bilaterally extending into M2 segments. IMPRESSION: 1. Negative for acute infarct. Moderate chronic microvascular ischemia in the white matter 2. Mild stenosis left posterior cerebral artery. Mild stenosis middle cerebral artery bifurcation bilaterally. No acute intracranial occlusion. Electronically Signed   By: Franchot Gallo M.D.   On: 05/24/2018 12:45    Medications:  I have reviewed the patient's current medications. Scheduled: . ciprofloxacin  500 mg Oral BID  . enoxaparin (LOVENOX) injection  40 mg Subcutaneous Q24H  . insulin aspart  0-5 Units Subcutaneous QHS  . insulin aspart  0-9 Units Subcutaneous TID WC  . insulin detemir  12 Units Subcutaneous Daily  . metroNIDAZOLE  500 mg Oral Q8H   Assessment:72 y.o. female presenting with focal symptoms of left side numbness including facial  numbness concerning for CVA with negative stroke work up thus far including negative MRI/MRA brain, Echo, US carotids and EEG which did not show any evidence of epileptiform discharges. Symptoms now resolving. She may also have underlying peripheral neuropathy secondary to diabetes due to persistent bilateral numbness and tingling sensation in her feet.  Plan 1. Continue aggressive medical management with dual therapy Aspirin 81 mg/day and Plavix 75 mg /day  with intensive management of vascular risk factor to keep systolic BP (SBP) <208 mm Hg (130 mm Hg if diabetic) and low density lipoprotein (LDL) <70 mg/dl, and lifestyle modification.Continuehigh potency statin. 2. NPO until RN stroke swallow screen 3. Telemetry monitoring 4. Frequent neuro checks 5. Recommend follow with cardiology for atrial fibrillation management  This patient was staffed with Dr. Irish Elders, Alease Frame who personally evaluated patient, reviewed documentation and agreed with assessment and plan of care as above.  Rufina Falco, DNP, FNP-BC Board certified Nurse Practitioner Neurology Service   LOS: 2 days    05/25/2018  11:43 AM

## 2018-05-25 NOTE — Progress Notes (Signed)
Physical Therapy Treatment Patient Details Name: Lynn Potter MRN: [id number] DOB: 08-05-1946 Today's Date: 05/25/2018    History of Present Illness 72yo female presented to ER secondary to lower abdominal pain, L UE/facial numbness; admitted for acute diverticulitis.  MRI head negative for acute infarct; patient reporting numbness now returned to baseline.    PT Comments    Pt reports she is ready to go home.  Bed mobility without assist.  She was able to stand and ambulate around unit with walker and min guard/supervision.  HR 77 at rest/arrival, 84 with gait but noted to be 56 upon looking at monitor in hallway after gait.  Pt with no LOB or safety concerns, has rollator and SPC at home.   Follow Up Recommendations  No PT follow up     Equipment Recommendations       Recommendations for Other Services       Precautions / Restrictions Precautions Precautions: Fall Restrictions Weight Bearing Restrictions: No    Mobility  Bed Mobility Overal bed mobility: Modified Independent             General bed mobility comments: additional time/effort but able to perform without assist  Transfers Overall transfer level: Modified independent Equipment used: Rolling walker (2 wheeled) Transfers: Sit to/from Stand Sit to Stand: Modified independent (Device/Increase time) Stand pivot transfers: Supervision          Ambulation/Gait Ambulation/Gait assistance: Min guard;Supervision Gait Distance (Feet): 200 Feet Assistive device: Rolling walker (2 wheeled)     Gait velocity interpretation: 1.31 - 2.62 ft/sec, indicative of limited community Metallurgist Rankin (Stroke Patients Only)       Balance Overall balance assessment: Needs assistance Sitting-balance support: Feet supported Sitting balance-Leahy Scale: Good     Standing balance support: Bilateral upper extremity supported Standing  balance-Leahy Scale: Fair                              Cognition Arousal/Alertness: Awake/alert Behavior During Therapy: WFL for tasks assessed/performed Overall Cognitive Status: Within Functional Limits for tasks assessed                                        Exercises Other Exercises Other Exercises: Pt educated in strategies to support gradual return to daily routines and encouraged to utilize prepared meals available to her at her apartment complex    General Comments        Pertinent Vitals/Pain Pain Assessment: No/denies pain    Home Living Family/patient expects to be discharged to:: Private residence Living Arrangements: Alone Available Help at Discharge: Family;Friend(s);Neighbor;Available PRN/intermittently Type of Home: Apartment Home Access: Level entry   Home Layout: One level Home Equipment: Cane - single point;Walker - 4 wheels;Grab bars - tub/shower      Prior Function Level of Independence: Independent with assistive device(s)      Comments: Mod I with amb SPC or rollator in the home and with rollator in community, no fall history, Ind with ADLs, med mgt, +driving   PT Goals (current goals can now be found in the care plan section) Acute Rehab PT Goals Patient Stated Goal: go home Progress towards PT goals: Progressing toward goals    Frequency  Min 2X/week      PT Plan Current plan remains appropriate    Co-evaluation              AM-PAC PT "6 Clicks" Daily Activity  Outcome Measure  Difficulty turning over in bed (including adjusting bedclothes, sheets and blankets)?: None Difficulty moving from lying on back to sitting on the side of the bed? : None Difficulty sitting down on and standing up from a chair with arms (e.g., wheelchair, bedside commode, etc,.)?: None Help needed moving to and from a bed to chair (including a wheelchair)?: A Little Help needed walking in hospital room?: A Little Help  needed climbing 3-5 steps with a railing? : A Little 6 Click Score: 21    End of Session Equipment Utilized During Treatment: Gait belt Activity Tolerance: Patient tolerated treatment well Patient left: in chair;with chair alarm set;with call bell/phone within reach Nurse Communication: Other (comment)       Time: 6770-3403 PT Time Calculation (min) (ACUTE ONLY): 10 min  Charges:  $Gait Training: 8-22 mins                    Chesley Noon, PTA 05/25/18, 8:53 AM

## 2018-05-25 NOTE — Evaluation (Signed)
Occupational Therapy Evaluation Patient Details Name: Lynn Potter MRN: [id number] DOB: 1946/09/25 Today's Date: 05/25/2018    History of Present Illness 72yo female presented to ER secondary to lower abdominal pain, L UE/facial numbness; admitted for acute diverticulitis.  MRI head negative for acute infarct; patient reporting numbness now returned to baseline.   Clinical Impression   Pt seen for OT evaluation this date. Prior to hospital admission, pt was modified independent with mobility using rollator or SPC, indep with ADL, and active in her retirement community attending bingo, [employer], and going to lunch offered by the community a couple times per week. Pt denies falls and reports driving prior to admission. Pt lives alone but endorses having family/friends/neighbors who she could call if she needed help. Currently pt demonstrates no significant impairments and pt reporting feeling back to baseline. Denies sensory deficits in LUE, BLE paresthesias at baseline. No strength, coordination, balance, visual, or cognitive deficits appreciated upon assessment. Pt verbalized understanding of all education/training completed at time of evaluation. No additional skilled OT needs. Will sign off. Please re-consult if additional needs arise this admission.    Follow Up Recommendations  No OT follow up    Equipment Recommendations  None recommended by OT    Recommendations for Other Services       Precautions / Restrictions Precautions Precautions: Fall Restrictions Weight Bearing Restrictions: No      Mobility Bed Mobility Overal bed mobility: Modified Independent             General bed mobility comments: additional time/effort but able to perform without assist  Transfers Overall transfer level: Needs assistance Equipment used: None Transfers: Sit to/from Stand;Stand Pivot Transfers Sit to Stand: Supervision Stand pivot transfers: Supervision            Balance Overall  balance assessment: Needs assistance Sitting-balance support: No upper extremity supported;Feet supported Sitting balance-Leahy Scale: Good     Standing balance support: No upper extremity supported Standing balance-Leahy Scale: Fair                             ADL either performed or assessed with clinical judgement   ADL Overall ADL's : Modified independent                                       General ADL Comments: near baseline, generally supervision level for transitional movements during ADL tasks, but able to perform herself without physical assist. Donned socks sitting EOB, performed toilet transfer to [facility] (due to urgency of need to urinate)     Vision Baseline Vision/History: Wears glasses Wears Glasses: At all times Patient Visual Report: No change from baseline Vision Assessment?: No apparent visual deficits     Perception     Praxis      Pertinent Vitals/Pain Pain Assessment: No/denies pain     Hand Dominance Right   Extremity/Trunk Assessment Upper Extremity Assessment Upper Extremity Assessment: Overall WFL for tasks assessed(pt denies sensory deficits, coordination intact)   Lower Extremity Assessment Lower Extremity Assessment: Overall WFL for tasks assessed(grossly WFL, mild paresthesia, currently at baseline per pt)   Cervical / Trunk Assessment Cervical / Trunk Assessment: Normal   Communication Communication Communication: No difficulties   Cognition Arousal/Alertness: Awake/alert Behavior During Therapy: WFL for tasks assessed/performed Overall Cognitive Status: Within Functional Limits for tasks assessed  General Comments       Exercises Other Exercises Other Exercises: Pt educated in strategies to support gradual return to daily routines and encouraged to utilize prepared meals available to her at her apartment complex   Shoulder Instructions      Home  Living Family/patient expects to be discharged to:: Private residence Living Arrangements: Alone Available Help at Discharge: Family;Friend(s);Neighbor;Available PRN/intermittently Type of Home: Apartment Home Access: Level entry     Home Layout: One level     Bathroom Shower/Tub: Occupational psychologist: Handicapped height     Home Equipment: Cane - single point;Walker - 4 wheels;Grab bars - tub/shower          Prior Functioning/Environment Level of Independence: Independent with assistive device(s)        Comments: Mod I with amb SPC or rollator in the home and with rollator in community, no fall history, Ind with ADLs, med mgt, +driving        OT Problem List:        OT Treatment/Interventions:      OT Goals(Current goals can be found in the care plan section) Acute Rehab OT Goals Patient Stated Goal: go home OT Goal Formulation: All assessment and education complete, DC therapy  OT Frequency:     Barriers to D/C:            Co-evaluation              AM-PAC PT "6 Clicks" Daily Activity     Outcome Measure Help from another person eating meals?: None Help from another person taking care of personal grooming?: None Help from another person toileting, which includes using toliet, bedpan, or urinal?: None Help from another person bathing (including washing, rinsing, drying)?: None Help from another person to put on and taking off regular upper body clothing?: None Help from another person to put on and taking off regular lower body clothing?: None 6 Click Score: 24   End of Session    Activity Tolerance: Patient tolerated treatment well Patient left: in bed;with call bell/phone within reach;with bed alarm set  OT Visit Diagnosis: Other abnormalities of gait and mobility (R26.89)                Time: 5465-0354 OT Time Calculation (min): 11 min Charges:  OT General Charges $OT Visit: 1 Visit OT Evaluation $OT Eval Low Complexity: 1  Low  Lynn Potter, MPH, MS, OTR/L ascom [phone] 05/25/18, 8:47 AM

## 2018-05-25 NOTE — Progress Notes (Signed)
Patient discharged per MD order. All discharge instructions given, medications reviewed and all questions answered.

## 2018-05-25 NOTE — Progress Notes (Addendum)
Inpatient Diabetes Program Recommendations  AACE/ADA: New Consensus Statement on Inpatient Glycemic Control (2019)  Target Ranges:  Prepandial:   less than 140 mg/dL      Peak postprandial:   less than 180 mg/dL (1-2 hours)      Critically ill patients:  140 - 180 mg/dL   Results for Lynn Potter, Lynn Potter (MRN [id number]) as of 05/25/2018 07:53  Ref. Range 05/24/2018 07:47 05/24/2018 12:54 05/24/2018 17:45 05/24/2018 20:28 05/25/2018 07:41  Glucose-Capillary Latest Ref Range: 70 - 99 mg/dL 115 (H) 89 99 114 (H) 106 (H)  Results for Lynn Potter, Lynn Potter (MRN [id number]) as of 05/25/2018 07:53  Ref. Range 05/24/2018 05:06  Hemoglobin A1C Latest Ref Range: 4.8 - 5.6 % 6.1 (H)   Review of Glycemic Control  Diabetes history: DM2 Outpatient Diabetes medications: Levemir 25 units daily if CBG >150 mg/dl (rarely ever used), Jardiance 25 mg daily, Metformin 500 mg BID, Novolog 0-9 units TID with meals Current orders for Inpatient glycemic control: Levemir 12 units daily, Novolog 0-9 units TID with meals, Novolog 0-5 units QHS  Inpatient Diabetes Program Recommendations: HgbA1C: A1C 6.1% on 05/24/18 indicating an average glucose of 128 mg/dl over the past 2-3 months.  NOTE: Noted consult for Diabetes Coordinator. Chart reviewed. Noted patient was newly dx with DM in October 2018 during hospitalization. Agree with current insulin orders. A1C 6.1% on 05/24/18 indicating good DM control. Will continue to follow and make recommendations if needed as more data is collected.  Addendum 05/25/18@10 :40-Spoke with patient regarding DM control and to verify outpatient DM medications. Patient reports that she takes Metformin 500 mg BID, Jardiance 25 mg daily, Levemir 25 units daily if CBG >150 mg/d, and Novolog per correction scale BID. Patient states that she rarely has to use the Levemir and Novolog because her glucose trends well with taking Metformin and Jardiance. Patient states that she checks her glucose BID and that it averages  100-120 mg/dl. Current A1C 6.1% on 05/24/18 indicating an average glucose of 128 mg/dl which correlates with patient reported glucose trends.  Praised patient for keeping DM well controlled and encouraged her to continue to do so. Patient verbalized understanding of information discussed and she reports that she has no questions at this time related to DM.    Thanks, Barnie Alderman, RN, MSN, CDE Diabetes Coordinator Inpatient Diabetes Program [phone] (Team Pager from 8am to 5pm)

## 2018-05-26 NOTE — Discharge Summary (Signed)
[place] [facility] [place] NAME: Lynn Potter    MR#:  [id number]  DATE OF BIRTH:  1946-02-22  DATE OF ADMISSION:  05/23/2018   ADMITTING PHYSICIAN: Nicholes Mango, MD  DATE OF DISCHARGE: 05/25/2018 12:40 PM  PRIMARY CARE PHYSICIAN: Remi Haggard, FNP   ADMISSION DIAGNOSIS:  Diverticulitis [K57.92] Near syncope [R55] Cerebrovascular accident (CVA), unspecified mechanism ([place]) [I63.9] DISCHARGE DIAGNOSIS:  Active Problems:   Acute diverticulitis  SECONDARY DIAGNOSIS:   Past Medical History:  Diagnosis Date  . AF (atrial fibrillation) ([place])   . Arthritis   . Asthma   . Chicken pox   . Diabetes mellitus without complication ([place])   . Glaucoma   . Hemorrhoids   . History of hiatal hernia   . Hypertension   . Migraines   . Obesity   . Opiate use 03/16/2017  . Personal history of tobacco use, presenting hazards to health 06/18/2015  . Sleep apnea    HOSPITAL COURSE:  RosaGarneris a20 y.o.femalewith a known history of chronic atrial fibrillation, diabetes mellitus, hypertension, multiple other medical problems admitted for left lower quadrant abdominal pain which is really severe and also complaining of some back pain. Patient is reporting left-sided numbness including left facial numbness. CT head is negative. CT abdomen has revealed acute diverticulitis.   # Acute LLQ abd pain 2/2 acute diverticulitis - improving with ciprofloxacin and Flagyl - tolerating diet, afebrile  # focal symptoms of left side numbness including facial numbness concerning for CVA with negative stroke work up thus far including negative MRI/MRA brain, Echo, US carotids and EEG which did not show any evidence of epileptiform discharges. Symptoms now resolving. She may also have underlying peripheral neuropathy secondary to diabetes due to persistent bilateral numbness and tingling sensation in her feet. -Continue aggressivemedical management with dual  therapy Aspirin 81 mg/day and Plavix 75 mg /day with intensive management of vascular risk factor to keep systolic BP (SBP) <767 mm Hg (130 mm Hg if diabetic) and low density lipoprotein (LDL) <70 mg/dl, and lifestyle modification.Continuehigh potency statin. - Recommend follow with cardiology for atrial fibrillation management  #UTI with abnormal urinalysis -Urine culture had mix organisms. ciprofloxacin should cover it regardless  # Hypokalemia repleted  # DM stable DISCHARGE CONDITIONS:  stable CONSULTS OBTAINED:  Treatment Team:  Leotis Pain, MD DRUG ALLERGIES:  No Known Allergies DISCHARGE MEDICATIONS:   Allergies as of 05/25/2018   No Known Allergies     Medication List    TAKE these medications   aspirin 81 MG tablet Take 81 mg by mouth daily.   atorvastatin 40 MG tablet Commonly known as:  LIPITOR Take 1 tablet (40 mg total) by mouth daily. What changed:    medication strength  how much to take   azelastine 0.1 % nasal spray Commonly known as:  ASTELIN Place 1 spray into both nostrils 2 (two) times daily as needed for rhinitis or allergies.   benazepril 20 MG tablet Commonly known as:  LOTENSIN Take 20 mg by mouth daily.   ciprofloxacin 500 MG tablet Commonly known as:  CIPRO Take 1 tablet (500 mg total) by mouth 2 (two) times daily for 8 days.   D-5000 5000 units Tabs Generic drug:  Cholecalciferol Take 5,000 Units by mouth daily.   ferrous sulfate 325 (65 FE) MG EC tablet Take 325 mg by mouth 2 (two) times daily.   hydrOXYzine 25 MG capsule Commonly known as:  VISTARIL Take 25 mg by mouth  every 12 (twelve) hours.   insulin aspart 100 UNIT/ML injection Commonly known as:  novoLOG Inject 0-9 Units into the skin 3 (three) times daily with meals.   insulin aspart 100 UNIT/ML injection Commonly known as:  novoLOG Inject 0-5 Units into the skin at bedtime.   insulin aspart 100 UNIT/ML injection Commonly known as:  novoLOG Inject 4  Units into the skin 3 (three) times daily with meals.   insulin glargine 100 UNIT/ML injection Commonly known as:  LANTUS Inject 0.25 mLs (25 Units total) into the skin daily.   JARDIANCE 25 MG Tabs tablet Generic drug:  empagliflozin Take 25 mg by mouth daily.   latanoprost 0.005 % ophthalmic solution Commonly known as:  XALATAN Place 1 drop into both eyes at bedtime.   LEVEMIR FLEXTOUCH 100 UNIT/ML Pen Generic drug:  Insulin Detemir Inject 25 Units into the skin daily. Patient reports she only takes Levemir if her glucose is >150 mg/dl which is rare for her   metFORMIN 500 MG tablet Commonly known as:  GLUCOPHAGE Take 1 tablet (500 mg total) by mouth 2 (two) times daily with a meal.   metroNIDAZOLE 500 MG tablet Commonly known as:  FLAGYL Take 1 tablet (500 mg total) by mouth every 8 (eight) hours for 8 days.   olmesartan-hydrochlorothiazide 40-25 MG tablet Commonly known as:  BENICAR HCT Take 1 tablet by mouth daily.   pantoprazole 40 MG tablet Commonly known as:  PROTONIX Take 1 tablet by mouth 2 (two) times daily.   pregabalin 150 MG capsule Commonly known as:  LYRICA Take 150 mg by mouth 3 (three) times daily.   vitamin C 1000 MG tablet Take 1,000 mg by mouth daily.        DISCHARGE INSTRUCTIONS:   DIET:  Regular diet DISCHARGE CONDITION:  Good ACTIVITY:  Activity as tolerated OXYGEN:  Home Oxygen: No.  Oxygen Delivery: room air DISCHARGE LOCATION:  home   If you experience worsening of your admission symptoms, develop shortness of breath, life threatening emergency, suicidal or homicidal thoughts you must seek medical attention immediately by calling 911 or calling your MD immediately  if symptoms less severe.  You Must read complete instructions/literature along with all the possible adverse reactions/side effects for all the Medicines you take and that have been prescribed to you. Take any new Medicines after you have completely understood and  accpet all the possible adverse reactions/side effects.   Please note  You were cared for by a hospitalist during your hospital stay. If you have any questions about your discharge medications or the care you received while you were in the hospital after you are discharged, you can call the unit and asked to speak with the hospitalist on call if the hospitalist that took care of you is not available. Once you are discharged, your primary care physician will handle any further medical issues. Please note that NO REFILLS for any discharge medications will be authorized once you are discharged, as it is imperative that you return to your primary care physician (or establish a relationship with a primary care physician if you do not have one) for your aftercare needs so that they can reassess your need for medications and monitor your lab values.    On the day of Discharge:  VITAL SIGNS:  Blood pressure (!) 157/77, pulse 62, temperature 98 F (36.7 C), temperature source Oral, resp. rate 18, height 5\' 4"  (1.626 m), weight 95.3 kg, SpO2 99 %. PHYSICAL EXAMINATION:  GENERAL:  72  y.o.-year-old patient lying in the bed with no acute distress.  EYES: Pupils equal, round, reactive to light and accommodation. No scleral icterus. Extraocular muscles intact.  HEENT: Head atraumatic, normocephalic. Oropharynx and nasopharynx clear.  NECK:  Supple, no jugular venous distention. No thyroid enlargement, no tenderness.  LUNGS: Normal breath sounds bilaterally, no wheezing, rales,rhonchi or crepitation. No use of accessory muscles of respiration.  CARDIOVASCULAR: S1, S2 normal. No murmurs, rubs, or gallops.  ABDOMEN: Soft, non-tender, non-distended. Bowel sounds present. No organomegaly or mass.  EXTREMITIES: No pedal edema, cyanosis, or clubbing.  NEUROLOGIC: Cranial nerves II through XII are intact. Muscle strength 5/5 in all extremities. Sensation intact. Gait not checked.  PSYCHIATRIC: The patient is alert  and oriented x 3.  SKIN: No obvious rash, lesion, or ulcer.  DATA REVIEW:   CBC Recent Labs  Lab 05/25/18 0455  WBC 6.7  HGB 10.7*  HCT 32.1*  PLT 232    Chemistries  Recent Labs  Lab 05/24/18 0506 05/25/18 0455  NA 139 140  K 3.5 4.0  CL 106 110  CO2 29 26  GLUCOSE 106* 95  BUN 22 16  CREATININE 0.84 0.75  CALCIUM 8.5* 8.1*  AST 141*  --   ALT 152*  --   ALKPHOS 92  --   BILITOT 1.1  --      Follow-up Information    Remi Haggard, FNP. Go on 05/28/2018.   Specialty:  Family Medicine Why:  @9 :30 AM Contact information: [name] [address] [phone]        Vladimir Crofts, MD. Go on 05/28/2018.   Specialty:  Neurology Why:  @1 :00 PM Contact information: [facility] [place] [address] [phone]           Management plans discussed with the patient, family and they are in agreement.  CODE STATUS: Prior   TOTAL TIME TAKING CARE OF THIS PATIENT: 45 minutes.    Max Sane M.D on 05/26/2018 at 12:23 PM  Between 7am to 6pm - Pager - [phone]  After 6pm go to [email or web address] - Proofreader  [facility]  Hospitalists  Office  [phone]  CC: Primary care physician; Remi Haggard, FNP   Note: This dictation was prepared with Dragon dictation along with smaller phrase technology. Any transcriptional errors that result from this process are unintentional.

## 2018-07-05 ENCOUNTER — Other Ambulatory Visit: Payer: Self-pay | Admitting: Student

## 2018-07-05 DIAGNOSIS — G8929 Other chronic pain: Secondary | ICD-10-CM

## 2018-07-05 DIAGNOSIS — M545 Low back pain: Principal | ICD-10-CM

## 2018-07-09 ENCOUNTER — Encounter: Payer: Self-pay | Admitting: Podiatry

## 2018-07-09 ENCOUNTER — Ambulatory Visit (INDEPENDENT_AMBULATORY_CARE_PROVIDER_SITE_OTHER): Payer: Medicare Other | Admitting: Podiatry

## 2018-07-09 DIAGNOSIS — E0843 Diabetes mellitus due to underlying condition with diabetic autonomic (poly)neuropathy: Secondary | ICD-10-CM | POA: Diagnosis not present

## 2018-07-09 DIAGNOSIS — B351 Tinea unguium: Secondary | ICD-10-CM

## 2018-07-09 DIAGNOSIS — M79676 Pain in unspecified toe(s): Secondary | ICD-10-CM | POA: Diagnosis not present

## 2018-07-11 NOTE — Progress Notes (Signed)
   SUBJECTIVE Patient with a history of diabetes mellitus presents to office today complaining of elongated, thickened nails that cause pain while ambulating in shoes. She is unable to trim her own nails. She also noted pain to the lateral aspects of bilateral feet that began 2 weeks ago. She states the pain is worse at the end of the day when she goes to bed. She has not done anything for treatment. Patient is here for further evaluation and treatment.   Past Medical History:  Diagnosis Date  . AF (atrial fibrillation) ([place])   . Arthritis   . Asthma   . Chicken pox   . Diabetes mellitus without complication ([place])   . Glaucoma   . Hemorrhoids   . History of hiatal hernia   . Hypertension   . Migraines   . Obesity   . Opiate use 03/16/2017  . Personal history of tobacco use, presenting hazards to health 06/18/2015  . Sleep apnea     OBJECTIVE General Patient is awake, alert, and oriented x 3 and in no acute distress. Derm Skin is dry and supple bilateral. Negative open lesions or macerations. Remaining integument unremarkable. Nails are tender, long, thickened and dystrophic with subungual debris, consistent with onychomycosis, 1-5 bilateral. No signs of infection noted. Vasc  DP and PT pedal pulses palpable bilaterally. Temperature gradient within normal limits.  Neuro Epicritic and protective threshold sensation diminished bilaterally.  Musculoskeletal Exam No symptomatic pedal deformities noted bilateral. Muscular strength within normal limits.  ASSESSMENT 1. Diabetes Mellitus w/ peripheral neuropathy 2. Onychomycosis of nail due to dermatophyte bilateral 3. Pain in foot bilateral  PLAN OF CARE 1. Patient evaluated today. 2. Instructed to maintain good pedal hygiene and foot care. Stressed importance of controlling blood sugar.  3. Mechanical debridement of nails 1-5 bilaterally performed using a nail nipper. Filed with dremel without incident.  4. Recommended good shoes to  alleviate foot pain.  5. Return to clinic in 3 mos.     Edrick Kins, DPM [facility]  Dr. Edrick Kins, [place]                                        [place], [place] [address]                Office [phone]  Fax [phone]

## 2018-07-21 ENCOUNTER — Ambulatory Visit
Admission: RE | Admit: 2018-07-21 | Discharge: 2018-07-21 | Disposition: A | Discharge status: Home / Self Care | Payer: Medicare Other | Source: Ambulatory Visit | Attending: Otolaryngology | Admitting: Otolaryngology

## 2018-07-21 DIAGNOSIS — R918 Other nonspecific abnormal finding of lung field: Secondary | ICD-10-CM | POA: Insufficient documentation

## 2018-07-21 DIAGNOSIS — E041 Nontoxic single thyroid nodule: Secondary | ICD-10-CM | POA: Diagnosis present

## 2018-07-22 ENCOUNTER — Other Ambulatory Visit (HOSPITAL_COMMUNITY): Payer: Self-pay | Admitting: Otolaryngology

## 2018-07-22 DIAGNOSIS — E041 Nontoxic single thyroid nodule: Secondary | ICD-10-CM

## 2018-07-30 ENCOUNTER — Ambulatory Visit
Admission: RE | Admit: 2018-07-30 | Discharge: 2018-07-30 | Disposition: A | Discharge status: Home / Self Care | Payer: Medicare Other | Source: Ambulatory Visit | Attending: Student | Admitting: Student

## 2018-08-17 ENCOUNTER — Other Ambulatory Visit: Payer: Self-pay | Admitting: Otolaryngology

## 2018-08-17 DIAGNOSIS — R1314 Dysphagia, pharyngoesophageal phase: Secondary | ICD-10-CM

## 2018-08-31 ENCOUNTER — Ambulatory Visit
Admission: RE | Admit: 2018-08-31 | Discharge: 2018-08-31 | Disposition: A | Discharge status: Home / Self Care | Payer: Medicare Other | Source: Ambulatory Visit | Attending: Otolaryngology | Admitting: Otolaryngology

## 2018-08-31 DIAGNOSIS — R1314 Dysphagia, pharyngoesophageal phase: Secondary | ICD-10-CM | POA: Diagnosis not present

## 2018-08-31 NOTE — Therapy (Addendum)
[name], [name], [address] Phone: [phone]   Fax:     Modified Barium Swallow  Patient Details  Name: Lynn Potter MRN: [id number] Date of Birth: August 27, 1946 No data recorded  Encounter Date: 08/31/2018  End of Session - 08/31/18 1626    Visit Number  1    Number of Visits  1    Date for SLP Re-Evaluation  08/31/18    SLP Start Time  1250    SLP Stop Time   1400    SLP Time Calculation (min)  70 min    Activity Tolerance  Patient tolerated treatment well       Past Medical History:  Diagnosis Date  . AF (atrial fibrillation) ([place])   . Arthritis   . Asthma   . Chicken pox   . Diabetes mellitus without complication ([place])   . Glaucoma   . Hemorrhoids   . History of hiatal hernia   . Hypertension   . Migraines   . Obesity   . Opiate use 03/16/2017  . Personal history of tobacco use, presenting hazards to health 06/18/2015  . Sleep apnea     Past Surgical History:  Procedure Laterality Date  . ABDOMINAL HYSTERECTOMY    . BACK SURGERY    . carpal tunnell    . CATARACT EXTRACTION    . CHOLECYSTECTOMY    . COLONOSCOPY WITH ESOPHAGOGASTRODUODENOSCOPY (EGD)    . ESOPHAGOGASTRODUODENOSCOPY (EGD) WITH PROPOFOL N/A 02/16/2017   Procedure: ESOPHAGOGASTRODUODENOSCOPY (EGD) WITH PROPOFOL;  Surgeon: Manya Silvas, MD;  Location: [facility] ENDOSCOPY;  Service: Endoscopy;  Laterality: N/A;  . EYE SURGERY    . JOINT REPLACEMENT    . LUMBAR FUSION    . TOTAL KNEE ARTHROPLASTY    . UPPER ESOPHAGEAL ENDOSCOPIC ULTRASOUND (EUS) N/A 01/03/2016   Procedure: UPPER ESOPHAGEAL ENDOSCOPIC ULTRASOUND (EUS);  Surgeon: Holly Bodily, MD;  Location: [facility] ENDOSCOPY;  Service: Gastroenterology;  Laterality: N/A;    There were no vitals filed for this visit.       Subjective: Patient behavior: (alertness, ability to follow instructions, etc.): pt was awake/alert; verbally engaged w/ SLP re: her  issues and PMH including HTN, DM, Obesity, Hiatal Hernia, GERD. ENT notes reported pt being followed for Dysphonia and hoarseness; significant Reflux changes, edema, Reinke's edema, cobblestoning, erythema. ENT also noted polyps on vocal cords; pt continues Tobacco use 1ppd. Chief complaint: pt c/o "some" coughing during meals ~2-3x week(middle-end of meals) as well as coughing while she ate chocolate candy while watching TV. She denied consistent occurrence of this, denied weight loss or change in eating habits/foods, and denied any Pulmonary illness/infection in past 6 months. Pt does have a baseline of significant GERD(Per ENT notes) and Hiatal Hernia(2009).  Native dentition; OM exam WFL.     Objective:  Radiological Procedure: A videoflouroscopic evaluation of oral-preparatory, reflex initiation, and pharyngeal phases of the swallow was performed; as well as a screening of the upper esophageal phase.  I. POSTURE: upright II. VIEW: lateral III. COMPENSATORY STRATEGIES: none indicated IV. BOLUSES ADMINISTERED:  Thin Liquid: 5 trials  Nectar-thick Liquid: 1 trial  Honey-thick Liquid: NT  Puree: 2 trials  Mechanical Soft: 2 trials V. RESULTS OF EVALUATION: A. ORAL PREPARATORY PHASE: (The lips, tongue, and velum are observed for strength and coordination)       **Overall Severity Rating: WFL.   B. SWALLOW INITIATION/REFLEX: (The reflex is normal if "triggered" by the time the bolus reached  the base of the tongue)  **Overall Severity Rating: Slight-Min.   C. PHARYNGEAL PHASE: (Pharyngeal function is normal if the bolus shows rapid, smooth, and continuous transit through the pharynx and there is no pharyngeal residue after the swallow)  **Overall Severity Rating: [facility].   D. LARYNGEAL PENETRATION: (Material entering into the laryngeal inlet/vestibule but not aspirated): transient laryngeal penetration noted into the upper laryngeal vestibule, "flash" penetration, x3 - this appeared to clear w/  completion of the swallow. Suspect related to the reduced airway closure (tightness and timing) secondary to the impact of Reflux material on the tissues of the pharynx and larynx.  E. ASPIRATION: NONE F. ESOPHAGEAL PHASE: (Screening of the upper esophagus): no overt dysmotility noted in the Cervical Esophagus viewable; shoulders obscured.   ASSESSMENT: Pt appears to present w/ Mild pharyngeal phase dysphagia suspect impacted by the Esophageal phase of swallowing - pt's significant Laryngopharyngeal Reflux; baseline GERD and Hiatal Hernia, as per ENT notes. Pt appeared to demonstrate No oropharyngeal phase dysphagia during this study today. Of note, during the Esophageal phase, an outward protrusion from the posterior Cervical Esophageal wall was noted at ~C5 during the swallowing. This protrusion did not appear to impede bolus flow of the consistencies given during this study. Though, this may help to explain pt's c/o discomfort she experiences at times. Pt also has Esophageal issues of GERD, Hiatal Hernia, and h/o Reflux Esophagitis that will also impact Esophageal motility, discomfort when swallowing, and pt's perception of her swallowing.  During the Oral phase, appropriate oral phase management, timely A-P transfer, and thorough oral clearing w/ all bolus consistencies. During the Pharyngeal phase, timely pharyngeal swallow initiation at BOT w/ adequate and timely airway closure w/ no laryngeal penetration or aspiration occurring during this study. No remaining pharyngeal residue noted post swallowing indicating adequate pharyngeal pressure and laryngeal excursion during the swallow. Results were shown/dicussed w/ pt post study.  PLAN/RECOMMENDATIONS:  A. Diet: regular w/ well cut, moistened foods; thin liquids. Pills Whole in Puree if needed for easier swallowing and clearing(pt denied any difficulty swallowing Pills)  B. Swallowing Precautions: general aspiration precautions; continued recommended  REFLUX/GERD precautions  C. Recommended consultation to: continue f/u w/ GI for ongoing assessment, management, and education of GERD and Hiatal Hernia; Esophageal phase dysmotility  D. Therapy recommendations: None indicated at this time  E. Results and recommendations were discussed w/ pt; video viewed and recommendations/precautions discussed; Handouts given on general Reflux precautions             Dysphagia, pharyngoesophageal - Plan: DG OP Swallowing Func-Medicare/Speech Path, DG OP Swallowing Func-Medicare/Speech Path        Problem List Patient Active Problem List   Diagnosis Date Noted  . Acute diverticulitis 05/23/2018  . Hyperglycemia 07/28/2017  . Vitamin D insufficiency 03/25/2017  . Low back pain (primary )(left greater than right) 03/17/2017  . Chronic pain syndrome 03/16/2017  . Encounter for long-term opiate analgesic use 03/16/2017  . Long term current use of opiate analgesic 03/16/2017  . Opiate use 03/16/2017  . 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 Sacroiliac joint pain 03/16/2017  . Sepsis ([place]) 09/30/2016  . Primary osteoarthritis of right shoulder 10/17/2015  . Personal history of tobacco use, presenting hazards to health 06/18/2015  . Osteoarthritis of shoulder region 03/10/2014  . Rotator cuff tendinitis 03/10/2014  . S/P rotator cuff repair 03/10/2014       Orinda Kenner, MS, CCC-SLP Kort Stettler 08/31/2018, 4:27 PM  [facility]  [place], [name], [address] Phone: [phone]   Fax:     Name: GROVER WOODFIELD MRN: [id number] Date of Birth: 07/06/1946

## 2018-09-26 ENCOUNTER — Ambulatory Visit
Admission: RE | Admit: 2018-09-26 | Discharge: 2018-09-26 | Disposition: A | Discharge status: Home / Self Care | Payer: Medicare Other | Source: Ambulatory Visit | Attending: Student | Admitting: Student

## 2018-09-26 DIAGNOSIS — M48061 Spinal stenosis, lumbar region without neurogenic claudication: Secondary | ICD-10-CM | POA: Diagnosis not present

## 2018-09-26 DIAGNOSIS — M545 Low back pain: Secondary | ICD-10-CM | POA: Diagnosis not present

## 2018-09-26 DIAGNOSIS — G8929 Other chronic pain: Secondary | ICD-10-CM | POA: Diagnosis not present

## 2018-09-26 DIAGNOSIS — M5136 Other intervertebral disc degeneration, lumbar region: Secondary | ICD-10-CM | POA: Insufficient documentation

## 2018-10-04 ENCOUNTER — Other Ambulatory Visit: Payer: Self-pay | Admitting: Student

## 2018-10-04 DIAGNOSIS — M545 Low back pain: Principal | ICD-10-CM

## 2018-10-04 DIAGNOSIS — G8929 Other chronic pain: Secondary | ICD-10-CM

## 2018-10-08 ENCOUNTER — Ambulatory Visit: Payer: Medicare Other | Admitting: Podiatry

## 2018-10-11 ENCOUNTER — Ambulatory Visit
Admission: RE | Admit: 2018-10-11 | Discharge: 2018-10-11 | Disposition: A | Discharge status: Home / Self Care | Payer: Medicare Other | Source: Ambulatory Visit | Attending: Student | Admitting: Student

## 2018-10-11 DIAGNOSIS — M545 Low back pain, unspecified: Secondary | ICD-10-CM

## 2018-10-11 DIAGNOSIS — G8929 Other chronic pain: Secondary | ICD-10-CM | POA: Diagnosis present

## 2018-10-26 ENCOUNTER — Encounter: Payer: Self-pay | Admitting: Podiatry

## 2018-10-26 ENCOUNTER — Ambulatory Visit (INDEPENDENT_AMBULATORY_CARE_PROVIDER_SITE_OTHER): Payer: Medicare Other | Admitting: Podiatry

## 2018-10-26 DIAGNOSIS — B351 Tinea unguium: Secondary | ICD-10-CM | POA: Diagnosis not present

## 2018-10-26 DIAGNOSIS — E0843 Diabetes mellitus due to underlying condition with diabetic autonomic (poly)neuropathy: Secondary | ICD-10-CM | POA: Diagnosis not present

## 2018-10-26 DIAGNOSIS — M722 Plantar fascial fibromatosis: Secondary | ICD-10-CM | POA: Diagnosis not present

## 2018-10-26 DIAGNOSIS — M79676 Pain in unspecified toe(s): Secondary | ICD-10-CM

## 2018-11-02 NOTE — Progress Notes (Signed)
   SUBJECTIVE Patient with a history of diabetes mellitus presents to office today complaining of elongated, thickened nails that cause pain while ambulating in shoes. She is unable to trim her own nails. She is also reports significant pain in the toes and heels that began several weeks ago. She states it feels as if she is walking on rocks and nails. Patient is here for further evaluation and treatment.   Past Medical History:  Diagnosis Date  . AF (atrial fibrillation) ([place])   . Arthritis   . Asthma   . Chicken pox   . Diabetes mellitus without complication ([place])   . Glaucoma   . Hemorrhoids   . History of hiatal hernia   . Hypertension   . Migraines   . Obesity   . Opiate use 03/16/2017  . Personal history of tobacco use, presenting hazards to health 06/18/2015  . Sleep apnea     OBJECTIVE General Patient is awake, alert, and oriented x 3 and in no acute distress. Derm Skin is dry and supple bilateral. Negative open lesions or macerations. Remaining integument unremarkable. Nails are tender, long, thickened and dystrophic with subungual debris, consistent with onychomycosis, 1-5 bilateral. No signs of infection noted. Vasc  DP and PT pedal pulses palpable bilaterally. Temperature gradient within normal limits.  Neuro Epicritic and protective threshold sensation diminished bilaterally.  Musculoskeletal Exam Tenderness to palpation to the plantar aspect of the left heel along the plantar fascia. No symptomatic pedal deformities noted bilateral. Muscular strength within normal limits.  ASSESSMENT 1. Diabetes Mellitus w/ peripheral neuropathy 2. Onychomycosis of nail due to dermatophyte bilateral 3. Plantar fasciitis left   PLAN OF CARE 1. Patient evaluated today. 2. Instructed to maintain good pedal hygiene and foot care. Stressed importance of controlling blood sugar.  3. Mechanical debridement of nails 1-5 bilaterally performed using a nail nipper. Filed with dremel without  incident.  4. Injection of 0.5 mLs Celestone Soluspan injected into the left heel.  5. Recommended good shoe gear.  6. Return to clinic in 3 mos.     Edrick Kins, DPM [facility]  Dr. Edrick Kins, [address]                                        [place], [place] [address]                Office [phone]  Fax [phone]

## 2018-11-09 ENCOUNTER — Other Ambulatory Visit: Payer: Self-pay

## 2018-11-09 ENCOUNTER — Encounter: Payer: Self-pay | Admitting: Nurse Practitioner

## 2018-11-09 ENCOUNTER — Ambulatory Visit: Payer: Medicare Other | Attending: Nurse Practitioner | Admitting: Nurse Practitioner

## 2018-11-09 VITALS — BP 119/62 | HR 80 | Temp 98.2°F | Resp 18 | Ht 64.0 in | Wt 210.0 lb

## 2018-11-09 DIAGNOSIS — Z789 Other specified health status: Secondary | ICD-10-CM | POA: Insufficient documentation

## 2018-11-09 DIAGNOSIS — M25512 Pain in left shoulder: Secondary | ICD-10-CM | POA: Diagnosis present

## 2018-11-09 DIAGNOSIS — Z79899 Other long term (current) drug therapy: Secondary | ICD-10-CM | POA: Insufficient documentation

## 2018-11-09 DIAGNOSIS — M79605 Pain in left leg: Secondary | ICD-10-CM | POA: Insufficient documentation

## 2018-11-09 DIAGNOSIS — M25511 Pain in right shoulder: Secondary | ICD-10-CM | POA: Insufficient documentation

## 2018-11-09 DIAGNOSIS — Z79891 Long term (current) use of opiate analgesic: Secondary | ICD-10-CM | POA: Diagnosis present

## 2018-11-09 DIAGNOSIS — G8929 Other chronic pain: Secondary | ICD-10-CM | POA: Insufficient documentation

## 2018-11-09 DIAGNOSIS — M899 Disorder of bone, unspecified: Secondary | ICD-10-CM | POA: Diagnosis present

## 2018-11-09 DIAGNOSIS — M5442 Lumbago with sciatica, left side: Secondary | ICD-10-CM | POA: Diagnosis present

## 2018-11-09 DIAGNOSIS — G894 Chronic pain syndrome: Secondary | ICD-10-CM

## 2018-11-09 NOTE — Progress Notes (Signed)
Patient's Name: Lynn Potter  MRN: [id number]  Referring Provider: Deetta Perla, MD  DOB: 04/28/1946  PCP: Remi Haggard, FNP  DOS: 11/09/2018  Note by: Dionisio David NP  Service setting: Ambulatory outpatient  Specialty: Interventional Pain Management  Location: [facility] (AMB) Pain Management Facility    Patient type: New Patient    Primary Reason(s) for Visit: Initial Patient Evaluation CC: Back Pain (low)  HPI  Lynn Potter is a 73 y.o. year old, female patient, who comes today for an initial evaluation. She has Personal history of tobacco use, presenting hazards to health; Sepsis ([place]); Osteoarthritis of shoulder region; Primary osteoarthritis of right shoulder; Rotator cuff tendinitis; S/P rotator cuff repair; Chronic pain syndrome; Encounter for long-term opiate analgesic use; Long term current use of opiate analgesic; Opiate use; Degenerative joint disease (DJD) of hip; Sacroiliac joint pain; Low back pain (primary )(left greater than right); Vitamin D insufficiency; Hyperglycemia; Acute diverticulitis; Chronic bilateral low back pain with left-sided sciatica (Primary Area of Pain); Chronic pain of left lower extremity (Secondary Area of Pain); Chronic pain of both shoulders (Tertiary Area of Pain) (R>L); Pharmacologic therapy; Disorder of skeletal system; and Problems influencing health status on their problem list.. Her primarily concern today is the Back Pain (low)  Pain Assessment: Location: Lower Back Radiating: radiates into left leg on the front and side to all of foot, on right side goes down the side of leg to ankle.  Left side is worse Onset: More than a month ago Duration: Chronic pain Quality: Aching, Throbbing Severity: 9 /10 (subjective, self-reported pain score)  Note: Reported level is compatible with observation. Clinically the patient looks like a 2/10 A 2/10 is viewed as "Mild to Moderate" and described as noticeable and distracting. Impossible to hide from other people. More  frequent flare-ups. Still possible to adapt and function close to normal. It can be very annoying and may have occasional stronger flare-ups. With discipline, patients may get used to it and adapt. Information on the proper use of the pain scale provided to the patient today. When using our objective Pain Scale, levels between 6 and 10/10 are said to belong in an emergency room, as it progressively worsens from a 6/10, described as severely limiting, requiring emergency care not usually available at an outpatient pain management facility. At a 6/10 level, communication becomes difficult and requires great effort. Assistance to reach the emergency department may be required. Facial flushing and profuse sweating along with potentially dangerous increases in heart rate and blood pressure will be evident. Effect on ADL: limits activities Timing: Constant Modifying factors: nothing BP: 119/62  HR: 80  Onset and Duration: Present longer than 3 months Cause of pain: not noted Severity: NAS-11 at its worse: 10/10, NAS-11 at its best: 6/10 and NAS-11 now: 9/10 Timing: Night Aggravating Factors: Bending, Kneeling, Prolonged sitting and Prolonged standing Alleviating Factors: Nerve blocks Associated Problems: Night-time cramps, Tingling, Weakness and Pain that wakes patient up Quality of Pain: Aching, Cramping, Sharp, Throbbing, Tingling and Uncomfortable Previous Examinations or Tests: CT scan and MRI scan Previous Treatments: The patient denies not noted  The patient comes into the clinics today for the first time for a chronic pain management evaluation.  According to the patient her primary area of pain is in her lower back.  She admits this is from an accident 75.  Her pain is worse on the left side.  She has had previous surgery pleaded by Dr. Mauri Pole at [facility].  She has had  steroid injections in the past which were effective for a while by Dr. Sharlet Salina and Dr. Primus Bravo.  She denies any recent  physical therapy but has had recent images.  Her second area of pain is in her left leg.  She admits the pain goes down the back of her leg and rotates around to the front and top of her foot affecting her toes.  She has numbness tingling and weakness.  She denies a previous nerve conduction study.  Her third area pain is in her shoulders.  She admits that the right is greater than the left.  She has had previous rotator cuff surgery.  She denies any numbness, tingling or weakness in her arms.  Today I took the time to provide the patient with information regarding this pain practice. The patient was informed that the practice is divided into two sections: an interventional pain management section, as well as a completely separate and distinct medication management section. I explained that there are procedure days for interventional therapies, and evaluation days for follow-ups and medication management. Because of the amount of documentation required during both, they are kept separated. This means that there is the possibility that she may be scheduled for a procedure on one day, and medication management the next. I have also informed her that because of staffing and facility limitations, this practice will no longer take patients for medication management only. To illustrate the reasons for this, I gave the patient the example of surgeons, and how inappropriate it would be to refer a patient to his/her care, just to write for the post-surgical antibiotics on a surgery done by a different surgeon.   Because interventional pain management is part of the board-certified specialty for the doctors, the patient was informed that joining this practice means that they are open to any and all interventional therapies. I made it clear that this does not mean that they will be forced to have any procedures done. What this means is that I believe interventional therapies to be essential part of the diagnosis and  proper management of chronic pain conditions. Therefore, patients not interested in these interventional alternatives will be better served under the care of a different practitioner.  The patient was also made aware of my Comprehensive Pain Management Safety Guidelines where by joining this practice, they limit all of their nerve blocks and joint injections to those done by our practice, for as long as we are retained to manage their care. Historic Controlled Substance Pharmacotherapy Review  PMP and historical list of controlled substances: Pregabalin 150 mg, diazepam 5 mg, oxycodone/acetaminophen 5/325 mg, clonazepam 0.5 mg, Lyrica 75 mg, oxycodone 10 mg, Highest opioid analgesic regimen found: Oxycodone 10 mg 1 tablet every 4 hours (last fill date 04/28/2013) oxycodone 60 mg/day Most recent opioid analgesic: None Current opioid analgesics: None Highest recorded MME/day: 90 mg/day MME/day: 0 mg/day Medications: The patient did not bring the medication(s) to the appointment, as requested in our "New Patient Package" Pharmacodynamics: Desired effects: Analgesia: The patient reports >50% benefit. Reported improvement in function: The patient reports medication allows her to accomplish basic ADLs. Clinically meaningful improvement in function (CMIF): Sustained CMIF goals met Perceived effectiveness: Described as relatively effective, allowing for increase in activities of daily living (ADL) Undesirable effects: Side-effects or Adverse reactions: None reported Historical Monitoring: The patient  reports no history of drug use. List of all UDS Test(s): No results found for: MDMA, COCAINSCRNUR, PCPSCRNUR, PCPQUANT, CANNABQUANT, THCU, [place] List of all Serum Drug  Screening Test(s):  No results found for: AMPHSCRSER, BARBSCRSER, BENZOSCRSER, COCAINSCRSER, PCPSCRSER, PCPQUANT, THCSCRSER, CANNABQUANT, OPIATESCRSER, OXYSCRSER, PROPOXSCRSER Historical Background Evaluation: [place] PDMP: Six (6) year initial  data search conducted.              Department of public safety, offender search: Editor, commissioning Information) Non-contributory Risk Assessment Profile: Aberrant behavior: None observed or detected today Risk factors for fatal opioid overdose: None identified today Fatal overdose hazard ratio (HR): Calculation deferred Non-fatal overdose hazard ratio (HR): Calculation deferred Risk of opioid abuse or dependence: 0.7-3.0% with doses ? 36 MME/day and 6.1-26% with doses ? 120 MME/day. Substance use disorder (SUD) risk level: Pending results of Medical Psychology Evaluation for SUD Opioid risk tool (ORT) (Total Score): 0  ORT Scoring interpretation table:  Score <3 = Low Risk for SUD  Score between 4-7 = Moderate Risk for SUD  Score >8 = High Risk for Opioid Abuse   PHQ-2 Depression Scale:  Total score: 0  PHQ-2 Scoring interpretation table: (Score and probability of major depressive disorder)  Score 0 = No depression  Score 1 = 15.4% Probability  Score 2 = 21.1% Probability  Score 3 = 38.4% Probability  Score 4 = 45.5% Probability  Score 5 = 56.4% Probability  Score 6 = 78.6% Probability   PHQ-9 Depression Scale:  Total score: 0  PHQ-9 Scoring interpretation table:  Score 0-4 = No depression  Score 5-9 = Mild depression  Score 10-14 = Moderate depression  Score 15-19 = Moderately severe depression  Score 20-27 = Severe depression (2.4 times higher risk of SUD and 2.89 times higher risk of overuse)   Pharmacologic Plan: Pending ordered tests and/or consults  Meds  The patient has a current medication list which includes the following prescription(s): accu-chek aviva plus, amlodipine, vitamin c, aspirin, atorvastatin, atorvastatin, azelastine, baclofen, benazepril, cholecalciferol, ferrous sulfate, fluconazole, hydroxyzine, insulin aspart, insulin lispro, jardiance, latanoprost, levemir flextouch, metformin, methocarbamol, olmesartan-hydrochlorothiazide, pantoprazole, pregabalin,  accu-chek softclix lancets, and accu-chek aviva plus, and the following Facility-Administered Medications: betamethasone acetate-betamethasone sodium phosphate and betamethasone acetate-betamethasone sodium phosphate.  Current Outpatient Medications on File Prior to Visit  Medication Sig  . ACCU-CHEK AVIVA PLUS test strip   . amLODipine (NORVASC) 10 MG tablet Take 10 mg by mouth daily.  . Ascorbic Acid (VITAMIN C) 1000 MG tablet Take 1,000 mg by mouth daily.  Marland Kitchen aspirin 81 MG tablet Take 81 mg by mouth daily.  Marland Kitchen atorvastatin (LIPITOR) 40 MG tablet Take 1 tablet (40 mg total) by mouth daily.  Marland Kitchen atorvastatin (LIPITOR) 40 MG tablet 1 TAB(S) ORAL EVERY EVENING  . azelastine (ASTELIN) 0.1 % nasal spray Place 1 spray into both nostrils 2 (two) times daily as needed for rhinitis or allergies.   . baclofen (LIORESAL) 10 MG tablet   . benazepril (LOTENSIN) 20 MG tablet Take 20 mg by mouth daily.  . Cholecalciferol (D-5000) 5000 units TABS Take 5,000 Units by mouth daily.   . ferrous sulfate 325 (65 FE) MG EC tablet Take 325 mg by mouth 2 (two) times daily.   . fluconazole (DIFLUCAN) 150 MG tablet Take 150 mg by mouth daily.  . hydrOXYzine (VISTARIL) 25 MG capsule Take 25 mg by mouth every 12 (twelve) hours.   . insulin aspart (NOVOLOG) 100 UNIT/ML injection Inject 0-9 Units into the skin 3 (three) times daily with meals.  . insulin lispro (HUMALOG KWIKPEN) 100 UNIT/ML KwikPen Inject into the skin.  Marland Kitchen JARDIANCE 25 MG TABS tablet Take 25 mg by mouth daily.   Marland Kitchen  latanoprost (XALATAN) 0.005 % ophthalmic solution Place 1 drop into both eyes at bedtime.   Marland Kitchen LEVEMIR FLEXTOUCH 100 UNIT/ML Pen Inject 25 Units into the skin daily. Patient reports she only takes Levemir if her glucose is >150 mg/dl which is rare for her  . metFORMIN (GLUCOPHAGE) 500 MG tablet Take 1 tablet (500 mg total) by mouth 2 (two) times daily with a meal.  . methocarbamol (ROBAXIN) 500 MG tablet Take 500 mg by mouth 3 (three) times daily.   Marland Kitchen olmesartan-hydrochlorothiazide (BENICAR HCT) 40-25 MG tablet Take 1 tablet by mouth daily.  . pantoprazole (PROTONIX) 40 MG tablet Take 1 tablet by mouth 2 (two) times daily.   . pregabalin (LYRICA) 150 MG capsule Take 150 mg by mouth 3 (three) times daily.   Marland Kitchen ACCU-CHEK SOFTCLIX LANCETS lancets 2 (two) times daily.  . Blood Glucose Monitoring Suppl (ACCU-CHEK AVIVA PLUS) w/Device KIT EVERY DAY   Current Facility-Administered Medications on File Prior to Visit  Medication  . betamethasone acetate-betamethasone sodium phosphate (CELESTONE) injection 3 mg  . betamethasone acetate-betamethasone sodium phosphate (CELESTONE) injection 3 mg   Imaging Review    Shoulder Imaging:  Shoulder-R DG:  Results for orders placed during the hospital encounter of 11/26/17  DG Shoulder Right   Narrative CLINICAL DATA:  Chronic right shoulder pain. History of rotator cuff surgery.  EXAM: RIGHT SHOULDER - 2+ VIEW  COMPARISON:  MRI 09/15/2010. chest radiographs 07/30/2017.  FINDINGS: No evidence of acute fracture or dislocation. There are postsurgical changes consistent with previous subacromial decompression and distal clavicle resection. Moderate glenohumeral degenerative changes are present. There are calcifications within the subacromial space which may relate to calcific tendinosis.  IMPRESSION: No acute findings. Degenerative and postsurgical changes as described.   Electronically Signed   By: Richardean Sale M.D.   On: 11/27/2017 08:20   Lumbosacral Imaging: Lumbar MR wo contrast:  Results for orders placed during the hospital encounter of 09/26/18  MR LUMBAR SPINE WO CONTRAST   Narrative CLINICAL DATA:  Low back pain with bilateral leg pain  EXAM: MRI LUMBAR SPINE WITHOUT CONTRAST  TECHNIQUE: Multiplanar, multisequence MR imaging of the lumbar spine was performed. No intravenous contrast was administered.  COMPARISON:  MRI lumbar spine  06/18/2007  FINDINGS: Segmentation:  Normal  Alignment:  Mild anterolisthesis L3-4.  Remaining alignment normal.  Vertebrae:  Negative for fracture or mass.  No bone marrow edema.  Conus medullaris and cauda equina: Conus extends to the L1-2 level. Conus and cauda equina appear normal.  Paraspinal and other soft tissues: Negative for paraspinous mass or fluid collection  Disc levels:  T11-12: Disc degeneration and facet degeneration with mild spinal stenosis and mild foraminal narrowing bilaterally  T12-L1: Disc and facet degeneration without significant stenosis  L1-2: Mild disc bulging and mild facet degeneration without significant stenosis  L2-3: Mild disc degeneration and moderate facet degeneration. Mild spinal stenosis  L3-4: Marked progression of disc and facet degeneration since the prior study. Severe spinal stenosis has progressed significantly. Severe subarticular stenosis bilaterally with severe left foraminal encroachment. Small posterior synovial cyst on the right.  L4-5: Pedicle screw and interbody fusion. Solid fusion. Posterior decompression without stenosis  L5-S1: Solid pedicle screw fusion with posterior decompression. Negative for stenosis.  IMPRESSION: Marked progression of disc and facet degeneration at L3-4. Severe spinal stenosis at L3-4 has progressed.  Solid fusion L4-5 and L5-S1 without stenosis.   Electronically Signed   By: Franchot Gallo M.D.   On: 09/26/2018 14:33   Lumbar CT  wo contrast:  Results for orders placed during the hospital encounter of 10/11/18  CT LUMBAR SPINE WO CONTRAST   Narrative CLINICAL DATA:  LEFT hip and lower back pain. No recent fall.  EXAM: CT LUMBAR SPINE WITHOUT CONTRAST  TECHNIQUE: Multidetector CT imaging of the lumbar spine was performed without intravenous contrast administration. Multiplanar CT image reconstructions were also generated.  COMPARISON:  MRI  09/26/2018.  FINDINGS: Segmentation: Standard  Alignment: Grade 1 anterolisthesis L3-4, estimated 3 mm. Minor compensatory retrolisthesis thoracolumbar junction. Straightening of the normal lumbar lordosis across the L4-S1 fusion segments.  Vertebrae: No acute fracture or focal pathologic process.  Paraspinal and other soft tissues: Unremarkable. Aortic atherosclerosis.  Disc levels:  L1-L2: Trace retrolisthesis. Annular bulge. Facet disease. No stenosis.  L2-L3: Preserved disc height. Annular bulge. Moderate facet disease. No impingement.  L3-L4: Advanced disc space narrowing with vacuum phenomenon. Osseous spurring. 3 mm anterolisthesis. Marked facet overgrowth. Severe spinal stenosis despite posterior decompression. Apparent residual ligamentum flavum on the RIGHT. BILATERAL L4 and L3 neural impingement.  L4-L5:  Unremarkable post fusion interspace.  L5-S1:  Unremarkable post fusion interspace.  IMPRESSION: 1. Severe spinal stenosis representing adjacent segment disease at L3-4 despite posterior decompression. 3 mm slip. Annular bulge with osseous spurring. Posterior element hypertrophy. BILATERAL L4 and L3 neural impingement. 2. Unremarkable L4 through S1 fusion.   Electronically Signed   By: Staci Righter M.D.   On: 10/11/2018 14:36   Sacroiliac Joint Imaging: Sacroiliac Joint DG:  Results for orders placed during the hospital encounter of 03/16/17  DG Si Joints   Narrative CLINICAL DATA:  Chronic pain syndrome  EXAM: BILATERAL SACROILIAC JOINTS - 3+ VIEW  COMPARISON:  07/06/2013  FINDINGS: Partially visualized lower spinal hardware. The SI joints show mild sclerosis but appear patent. There is minimal spurring inferiorly.  IMPRESSION: Mild SI joint disease.   Electronically Signed   By: Donavan Foil M.D.   On: 03/16/2017 21:39   Hip Imaging:  Hip-R DG 2-3 views:  Results for orders placed during the hospital encounter of 03/16/17  DG HIP  UNILAT W OR W/O PELVIS 2-3 VIEWS RIGHT   Narrative CLINICAL DATA:  Chronic pain syndrome  EXAM: DG HIP (WITH OR WITHOUT PELVIS) 2-3V RIGHT  COMPARISON:  None.  FINDINGS: No fracture or malalignment. Surgical clips in the right pelvis. Mild joint space narrowing.  IMPRESSION: Mild arthritis of the right hip.  No acute osseous abnormality   Electronically Signed   By: Donavan Foil M.D.   On: 03/16/2017 21:37    Hip-L DG 2-3 views:  Results for orders placed during the hospital encounter of 03/16/17  DG HIP UNILAT W OR W/O PELVIS 2-3 VIEWS LEFT   Narrative CLINICAL DATA:  Chronic pain syndrome  EXAM: DG HIP (WITH OR WITHOUT PELVIS) 2-3V LEFT  COMPARISON:  None.  FINDINGS: Surgical clips in the pelvis. Pubic symphysis is intact. Mild SI joint disease. Partially visualized lumbar spine hardware.  Mild arthritis of the right hip.  Moderate to marked degenerative changes of the left hip with joint space narrowing and mild subarticular sclerosis. No acute fracture or malalignment  IMPRESSION: Moderate to marked arthritis of the left hip. No acute osseous abnormality.   Electronically Signed   By: Donavan Foil M.D.   On: 03/16/2017 21:37   Note: Available results from prior imaging studies were reviewed.        ROS  Cardiovascular History: Daily Aspirin intake Pulmonary or Respiratory History: Smoking, Snoring  and Temporary stoppage of  breathing during sleep Neurological History: Stroke (Residual deficits or weakness: not noted) Review of Past Neurological Studies:  Results for orders placed or performed during the hospital encounter of 05/23/18  MR BRAIN WO CONTRAST   Narrative   CLINICAL DATA:  TIA.  Atrial fibrillation, hypertension, diabetes  EXAM: MRI HEAD WITHOUT CONTRAST  MRA HEAD WITHOUT CONTRAST  TECHNIQUE: Multiplanar, multiecho pulse sequences of the brain and surrounding structures were obtained without intravenous contrast.  Angiographic images of the head were obtained using MRA technique without contrast.  COMPARISON:  CT head 05/23/2018  FINDINGS: MRI HEAD FINDINGS  Brain: Negative for acute infarct. Chronic microvascular ischemic changes in the white matter, moderate in degree. Brainstem and cerebellum normal. Negative for hemorrhage or mass. Ventricle size normal for age.  Vascular: Normal arterial flow voids  Skull and upper cervical spine: Negative  Sinuses/Orbits:  Paranasal sinuses clear. Bilateral cataract surgery  Other: None  MRA HEAD FINDINGS  Left vertebral artery dominant. Small right vertebral artery contributes to the basilar. Left PICA patent. Dominant right AICA patent. Basilar widely patent. Fetal origin posterior cerebral artery bilaterally with hypoplastic P1 segments bilaterally. Posterior cerebral arteries are patent bilaterally. Mild stenosis left P2 segment.  Internal carotid artery widely patent bilaterally. Tortuosity of the cavernous carotid bilaterally. Anterior and middle cerebral arteries are patent without large vessel occlusion. Mild stenosis at the carotid bifurcation bilaterally extending into M2 segments.  IMPRESSION: 1. Negative for acute infarct. Moderate chronic microvascular ischemia in the white matter 2. Mild stenosis left posterior cerebral artery. Mild stenosis middle cerebral artery bifurcation bilaterally. No acute intracranial occlusion.   Electronically Signed   By: Franchot Gallo M.D.   On: 05/24/2018 12:45   CT Head Wo Contrast   Narrative   CLINICAL DATA:  Syncope  EXAM: CT HEAD WITHOUT CONTRAST  TECHNIQUE: Contiguous axial images were obtained from the base of the skull through the vertex without intravenous contrast.  COMPARISON:  July 30, 2017  FINDINGS: Brain: Mild diffuse atrophy is stable. There is no intracranial mass, hemorrhage, extra-axial fluid collection, or midline shift. There is patchy small vessel  disease in the centra semiovale bilaterally, stable. There is no acute infarct evident. No new gray-white compartment lesion.  Vascular: No hyperdense vessel evident. There is calcification in each carotid siphon region.  Skull: Bony calvarium appears intact.  Sinuses/Orbits: Visualized paranasal sinuses are clear. Visualized orbits appear symmetric bilaterally.  Other: Mastoid air cells are clear.  IMPRESSION: Atrophy with patchy periventricular small vessel disease. No acute infarct evident. No mass or hemorrhage.  There are foci of arterial vascular calcification.   Electronically Signed   By: Lowella Grip III M.D.   On: 05/23/2018 17:38   EEG   Narrative   Wynelle Bourgeois, MD     05/25/2018 12:22 PM Date of recording 05/23/2018  Referring physician Carlynn Spry  Reason for the study Syncope  Technical Digital EEG recording using 10-20 international electrode system  Description of the recording Posterior dominant rhythm is between 8 to 8.5 Hz symmetrical and  reactive Mild slowing during drowsiness Sleep architecture was not seen. Epileptiform features were not seen.  Impression The EEG is normal the patient recorded in awake and drowsy state  only.   Psychological-Psychiatric History: No reported psychological or psychiatric signs or symptoms such as difficulty sleeping, anxiety, depression, delusions or hallucinations (schizophrenial), mood swings (bipolar disorders) or suicidal ideations or attempts Gastrointestinal History: Heartburn due to stomach pushing into lungs (Hiatal hernia) Genitourinary History: Passing kidney stones  Hematological History: No reported hematological signs or symptoms such as prolonged bleeding, low or poor functioning platelets, bruising or bleeding easily, hereditary bleeding problems, low energy levels due to low hemoglobin or being anemic Endocrine History: High blood sugar requiring insulin (IDDM) Rheumatologic  History: Rheumatoid arthritis Musculoskeletal History: Negative for myasthenia gravis, muscular dystrophy, multiple sclerosis or malignant hyperthermia Work History: Retired  Allergies  Ms. Dorgan has No Known Allergies.  Laboratory Chemistry  Inflammation Markers Lab Results  Component Value Date   CRP 20.4 (H) 03/16/2017   ESRSEDRATE 31 (H) 06/09/2017   (CRP: Acute Phase) (ESR: Chronic Phase) Renal Function Markers Lab Results  Component Value Date   BUN 16 05/25/2018   CREATININE 0.75 05/25/2018   GFRAA >60 05/25/2018   GFRNONAA >60 05/25/2018   Hepatic Function Markers Lab Results  Component Value Date   AST 141 (H) 05/24/2018   ALT 152 (H) 05/24/2018   ALBUMIN 3.1 (L) 05/24/2018   ALKPHOS 92 05/24/2018   Electrolytes Lab Results  Component Value Date   NA 140 05/25/2018   K 4.0 05/25/2018   CL 110 05/25/2018   CALCIUM 8.1 (L) 05/25/2018   MG 2.1 03/16/2017   Neuropathy Markers Lab Results  Component Value Date   VITAMINB12 182 (L) 03/16/2017   Bone Pathology Markers Lab Results  Component Value Date   ALKPHOS 92 05/24/2018   25OHVITD1 21 (L) 03/16/2017   25OHVITD2 <1.0 03/16/2017   25OHVITD3 21 03/16/2017   CALCIUM 8.1 (L) 05/25/2018   Coagulation Parameters Lab Results  Component Value Date   INR 0.92 06/09/2017   LABPROT 12.3 06/09/2017   APTT 24 06/09/2017   PLT 232 05/25/2018   Cardiovascular Markers Lab Results  Component Value Date   HGB 10.7 (L) 05/25/2018   HCT 32.1 (L) 05/25/2018   Note: Lab results reviewed.  [place]  Drug: Ms. Haworth  reports no history of drug use. Alcohol:  reports no history of alcohol use. Tobacco:  reports that she has been smoking cigarettes. She has been smoking about 1.00 pack per day. She has never used smokeless tobacco. Medical:  has a past medical history of AF (atrial fibrillation) ([place]), Arthritis, Asthma, Chicken pox, Diabetes mellitus without complication ([place]), Glaucoma, Hemorrhoids, History of  hiatal hernia, Hypertension, Migraines, Obesity, Opiate use (03/16/2017), Personal history of tobacco use, presenting hazards to health (06/18/2015), and Sleep apnea. Family: family history includes Diabetes in her mother and sister.  Past Surgical History:  Procedure Laterality Date  . ABDOMINAL HYSTERECTOMY    . BACK SURGERY    . carpal tunnell    . CATARACT EXTRACTION    . CHOLECYSTECTOMY    . COLONOSCOPY WITH ESOPHAGOGASTRODUODENOSCOPY (EGD)    . ESOPHAGOGASTRODUODENOSCOPY (EGD) WITH PROPOFOL N/A 02/16/2017   Procedure: ESOPHAGOGASTRODUODENOSCOPY (EGD) WITH PROPOFOL;  Surgeon: Manya Silvas, MD;  Location: [facility] ENDOSCOPY;  Service: Endoscopy;  Laterality: N/A;  . EYE SURGERY    . JOINT REPLACEMENT    . LUMBAR FUSION    . TOTAL KNEE ARTHROPLASTY    . UPPER ESOPHAGEAL ENDOSCOPIC ULTRASOUND (EUS) N/A 01/03/2016   Procedure: UPPER ESOPHAGEAL ENDOSCOPIC ULTRASOUND (EUS);  Surgeon: Holly Bodily, MD;  Location: [facility] ENDOSCOPY;  Service: Gastroenterology;  Laterality: N/A;   Active Ambulatory Problems    Diagnosis Date Noted  . Personal history of tobacco use, presenting hazards to health 06/18/2015  . Sepsis ([place]) 09/30/2016  . Osteoarthritis of shoulder region 03/10/2014  . Primary osteoarthritis of right shoulder 10/17/2015  . Rotator cuff tendinitis 03/10/2014  .  S/P rotator cuff repair 03/10/2014  . Chronic pain syndrome 03/16/2017  . Encounter for long-term opiate analgesic use 03/16/2017  . Long term current use of opiate analgesic 03/16/2017  . Opiate use 03/16/2017  . 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 Sacroiliac joint pain 03/16/2017  . Low back pain (primary )(left greater than right) 03/17/2017  . Vitamin D insufficiency 03/25/2017  . Hyperglycemia 07/28/2017  . Acute diverticulitis 05/23/2018  . Chronic bilateral low back pain with left-sided sciatica (Primary Area of Pain) 11/09/2018  . Chronic pain of left lower extremity (Secondary Area of  Pain) 11/09/2018  . Chronic pain of both shoulders [facility] Area of Pain) (R>L) 11/09/2018  . Pharmacologic therapy 11/09/2018  . Disorder of skeletal system 11/09/2018  . Problems influencing health status 11/09/2018   Resolved Ambulatory Problems    Diagnosis Date Noted  . No Resolved Ambulatory Problems   Past Medical History:  Diagnosis Date  . AF (atrial fibrillation) ([place])   . Arthritis   . Asthma   . Chicken pox   . Diabetes mellitus without complication ([place])   . Glaucoma   . Hemorrhoids   . History of hiatal hernia   . Hypertension   . Migraines   . Obesity   . Sleep apnea    Constitutional Exam  General appearance: Well nourished, well developed, and well hydrated. In no apparent acute distress Vitals:   11/09/18 1023  BP: 119/62  Pulse: 80  Resp: 18  Temp: 98.2 F (36.8 C)  SpO2: 100%  Weight: 210 lb (95.3 kg)  Height: 5' 4"  (1.626 m)   BMI Assessment: Estimated body mass index is 36.05 kg/m as calculated from the following:   Height as of this encounter: 5' 4"  (1.626 m).   Weight as of this encounter: 210 lb (95.3 kg).  BMI interpretation table: BMI level Category Range association with higher incidence of chronic pain  <18 kg/m2 Underweight   18.5-24.9 kg/m2 Ideal body weight   25-29.9 kg/m2 Overweight Increased incidence by 20%  30-34.9 kg/m2 Obese (Class I) Increased incidence by 68%  35-39.9 kg/m2 Severe obesity (Class II) Increased incidence by 136%  >40 kg/m2 Extreme obesity (Class III) Increased incidence by 254%   BMI Readings from Last 4 Encounters:  11/09/18 36.05 kg/m  05/24/18 36.06 kg/m  11/26/17 33.47 kg/m  07/28/17 33.78 kg/m   Wt Readings from Last 4 Encounters:  11/09/18 210 lb (95.3 kg)  05/24/18 210 lb 1.6 oz (95.3 kg)  11/26/17 195 lb (88.5 kg)  07/28/17 203 lb (92.1 kg)  Psych/Mental status: Alert, oriented x 3 (person, place, & time)       Eyes: PERLA Respiratory: No evidence of acute respiratory  distress  Cervical Spine Exam  Inspection: No masses, redness, or swelling Alignment: Symmetrical Functional ROM: Unrestricted ROM      Stability: No instability detected Muscle strength & Tone: Functionally intact Sensory: Unimpaired Palpation: No palpable anomalies              Upper Extremity (UE) Exam    Side: Right upper extremity  Side: Left upper extremity  Inspection: No masses, redness, swelling, or asymmetry. No contractures  Inspection: No masses, redness, swelling, or asymmetry. No contractures  Functional ROM: Adequate ROM          Functional ROM: Adequate ROM          Muscle strength & Tone: Functionally intact  Muscle strength & Tone: Functionally intact  Sensory: Movement-associated pain  Sensory: Unimpaired  Palpation: Tender              Palpation: No palpable anomalies              Specialized Test(s): Deferred         Specialized Test(s): Deferred          Thoracic Spine Exam  Inspection: No masses, redness, or swelling Alignment: Symmetrical Functional ROM: Unrestricted ROM Stability: No instability detected Sensory: Unimpaired Muscle strength & Tone: No palpable anomalies  Lumbar Spine Exam  Inspection: Well healed scar from previous spine surgery detected Alignment: Symmetrical Functional ROM: Guarding      Stability: No instability detected Muscle strength & Tone: Increased muscle tone over affected area Sensory: Movement-associated pain Palpation: Complains of area being tender to palpation       Provocative Tests: Lumbar Hyperextension and rotation test: Unable to perform due to pain. Patrick's Maneuver: Unable to perform                    Gait & Posture Assessment  Ambulation: Patient ambulates using a walker Gait: Limited. Using assistive device to ambulate Posture: Antalgic   Lower Extremity Exam    Side: Right lower extremity  Side: Left lower extremity  Inspection: No masses, redness, swelling, or asymmetry. No contractures  Inspection:  No masses, redness, swelling, or asymmetry. No contractures  Functional ROM: Unrestricted ROM          Functional ROM: Guarding          Muscle strength & Tone: Functionally intact  Muscle strength & Tone: Guarding  Sensory: Unimpaired  Sensory: Dermatomal pain pattern  Palpation: No palpable anomalies  Palpation: No palpable anomalies   Assessment  Primary Diagnosis & Pertinent Problem List: The primary encounter diagnosis was Chronic bilateral low back pain with left-sided sciatica (Primary Area of Pain). Diagnoses of Chronic pain of left lower extremity (Secondary Area of Pain), Chronic pain of both shoulders (Tertiary Area of Pain) (R>L), Chronic pain syndrome, Long term current use of opiate analgesic, Pharmacologic therapy, Disorder of skeletal system, and Problems influencing health status were also pertinent to this visit.  Visit Diagnosis: 1. Chronic bilateral low back pain with left-sided sciatica (Primary Area of Pain)   2. Chronic pain of left lower extremity (Secondary Area of Pain)   3. Chronic pain of both shoulders (Tertiary Area of Pain) (R>L)   4. Chronic pain syndrome   5. Long term current use of opiate analgesic   6. Pharmacologic therapy   7. Disorder of skeletal system   8. Problems influencing health status    Plan of Care  Initial treatment plan:  Please be advised that as per protocol, today's visit has been an evaluation only. We have not taken over the patient's controlled substance management.  Problem-specific plan: No problem-specific Assessment & Plan notes found for this encounter.  Ordered Lab-work, Procedure(s), Referral(s), & Consult(s): Orders Placed This Encounter  Procedures  . DG Shoulder Right  . DG Shoulder Left  . Compliance Drug Analysis, Ur  . Comp. Metabolic Panel (12)  . Magnesium  . Sedimentation rate  . C-reactive protein   Pharmacotherapy: Medications ordered:  No orders of the defined types were placed in this  encounter.  Medications administered during this visit: Weldon Inches had no medications administered during this visit.   Pharmacotherapy under consideration:  Opioid Analgesics: The patient was informed that there is no guarantee that she would be a candidate for opioid analgesics. The decision will  be made following CDC guidelines. This decision will be based on the results of diagnostic studies, as well as Ms. Sumler risk profile.  Membrane stabilizer: To be determined at a later time Muscle relaxant: To be determined at a later time NSAID: To be determined at a later time Other analgesic(s): To be determined at a later time   Interventional therapies under consideration: Ms. Skoda was informed that there is no guarantee that she would be a candidate for interventional therapies. The decision will be based on the results of diagnostic studies, as well as Ms. Luhrs risk profile.  Possible procedure(s): Diagnostic left lumbar epidural steroid injection done Diagnostic bilateral lumbar facet nerve block Possible bilateral lumbar facet radiofrequency ablation Diagnostic bilateral scapular nerve block Possible bilateral suprascapular radiofrequency ablation   Provider-requested follow-up: Return for 2nd Visit, w/ Dr. Dossie Arbour.  Future Appointments  Date Time Provider [place]  11/29/2018  9:30 AM Milinda Pointer, MD [facility] None  01/25/2019 10:15 AM Edrick Kins, DPM [facility]  07/25/2019  1:30 PM [facility]    Primary Care Physician: Remi Haggard, FNP Location: [facility] Outpatient [facility] Management Facility Note by:  Date: 11/09/2018; Time: 3:27 PM  Pain Score Disclaimer: We use the NRS-11 scale. This is a self-reported, subjective measurement of pain severity with only modest accuracy. It is used primarily to identify changes within a particular patient. It must be understood that outpatient pain scales are significantly less accurate that  those used for research, where they can be applied under ideal controlled circumstances with minimal exposure to variables. In reality, the score is likely to be a combination of pain intensity and pain affect, where pain affect describes the degree of emotional arousal or changes in action readiness caused by the sensory experience of pain. Factors such as social and work situation, setting, emotional state, anxiety levels, expectation, and prior pain experience may influence pain perception and show large inter-individual differences that may also be affected by time variables.  Patient instructions provided during this appointment: Patient Instructions   ____________________________________________________________________________________________  Appointment Policy Summary  It is our goal and responsibility to provide the medical community with assistance in the evaluation and management of patients with chronic pain. Unfortunately our resources are limited. Because we do not have an unlimited amount of time, or available appointments, we are required to closely monitor and manage their use. The following rules exist to maximize their use:  Patient's responsibilities: 1. Punctuality:  At what time should I arrive? You should be physically present in our office 30 minutes before your scheduled appointment. Your scheduled appointment is with your assigned healthcare provider. However, it takes 5-10 minutes to be "checked-in", and another 15 minutes for the nurses to do the admission. If you arrive to our office at the time you were given for your appointment, you will end up being at least 20-25 minutes late to your appointment with the provider. 2. Tardiness:  What happens if I arrive only a few minutes after my scheduled appointment time? You will need to reschedule your appointment. The cutoff is your appointment time. This is why it is so important that you arrive at least 30 minutes before that  appointment. If you have an appointment scheduled for 10:00 AM and you arrive at 10:01, you will be required to reschedule your appointment.  3. Plan ahead:  Always assume that you will encounter traffic on your way in. Plan for it. If you are dependent on a driver, make  sure they understand these rules and the need to arrive early. 4. Other appointments and responsibilities:  Avoid scheduling any other appointments before or after your pain clinic appointments.  5. Be prepared:  Write down everything that you need to discuss with your healthcare provider and give this information to the admitting nurse. Write down the medications that you will need refilled. Bring your pills and bottles (even the empty ones), to all of your appointments, except for those where a procedure is scheduled. 6. No children or pets:  Find someone to take care of them. It is not appropriate to bring them in. 7. Scheduling changes:  We request "advanced notification" of any changes or cancellations. 8. Advanced notification:  Defined as a time period of more than 24 hours prior to the originally scheduled appointment. This allows for the appointment to be offered to other patients. 9. Rescheduling:  When a visit is rescheduled, it will require the cancellation of the original appointment. For this reason they both fall within the category of "Cancellations".  10. Cancellations:  They require advanced notification. Any cancellation less than 24 hours before the  appointment will be recorded as a "No Show". 11. No Show:  Defined as an unkept appointment where the patient failed to notify or declare to the practice their intention or inability to keep the appointment.  Corrective process for repeat offenders:  1. Tardiness: Three (3) episodes of rescheduling due to late arrivals will be recorded as one (1) "No Show". 2. Cancellation or reschedule: Three (3) cancellations or rescheduling will be recorded as one (1) "No  Show". 3. "No Shows": Three (3) "No Shows" within a 12 month period will result in discharge from the practice. ____________________________________________________________________________________________   ______________________________________________________________________________________________  Specialty Pain Scale  Introduction:  There are significant differences in how pain is reported. The word pain usually refers to physical pain, but it is also a common synonym of suffering. The medical community uses a scale from 0 (zero) to 10 (ten) to report pain level. Zero (0) is described as "no pain", while ten (10) is described as "the worse pain you can imagine". The problem with this scale is that physical pain is reported along with suffering. Suffering refers to mental pain, or more often yet it refers to any unpleasant feeling, emotion or aversion associated with the perception of harm or threat of harm. It is the psychological component of pain.  Pain Specialists prefer to separate the two components. The pain scale used by this practice is the Verbal Numerical Rating Scale (VNRS-11). This scale is for the physical pain only. DO NOT INCLUDE how your pain psychologically affects you. This scale is for adults 76 years of age and older. It has 11 (eleven) levels. The 1st level is 0/10. This means: "right now, I have no pain". In the context of pain management, it also means: "right now, my physical pain is under control with the current therapy".  General Information:  The scale should reflect your current level of pain. Unless you are specifically asked for the level of your worst pain, or your average pain. If you are asked for one of these two, then it should be understood that it is over the past 24 hours.  Levels 1 (one) through 5 (five) are described below, and can be treated as an outpatient. Ambulatory pain management facilities such as ours are more than adequate to treat these  levels. Levels 6 (six) through 10 (ten) are also described below, however,  these must be treated as a hospitalized patient. While levels 6 (six) and 7 (seven) may be evaluated at an urgent care facility, levels 8 (eight) through 10 (ten) constitute medical emergencies and as such, they belong in a hospital's emergency department. When having these levels (as described below), do not come to our office. Our facility is not equipped to manage these levels. Go directly to an urgent care facility or an emergency department to be evaluated.  Definitions:  Activities of Daily Living (ADL): Activities of daily living (ADL or ADLs) is a term used in healthcare to refer to people's daily self-care activities. Health professionals often use a person's ability or inability to perform ADLs as a measurement of their functional status, particularly in regard to people post injury, with disabilities and the elderly. There are two ADL levels: Basic and Instrumental. Basic Activities of Daily Living (BADL  or BADLs) consist of self-care tasks that include: Bathing and showering; personal hygiene and grooming (including brushing/combing/styling hair); dressing; Toilet hygiene (getting to the toilet, cleaning oneself, and getting back up); eating and self-feeding (not including cooking or chewing and swallowing); functional mobility, often referred to as "transferring", as measured by the ability to walk, get in and out of bed, and get into and out of a chair; the broader definition (moving from one place to another while performing activities) is useful for people with different physical abilities who are still able to get around independently. Basic ADLs include the things many people do when they get up in the morning and get ready to go out of the house: get out of bed, go to the toilet, bathe, dress, groom, and eat. On the average, loss of function typically follows a particular order. Hygiene is the first to go, followed  by loss of toilet use and locomotion. The last to go is the ability to eat. When there is only one remaining area in which the person is independent, there is a 62.9% chance that it is eating and only a 3.5% chance that it is hygiene. Instrumental Activities of Daily Living (IADL or IADLs) are not necessary for fundamental functioning, but they let an individual live independently in a community. IADL consist of tasks that include: cleaning and maintaining the house; home establishment and maintenance; care of others (including selecting and supervising caregivers); care of pets; child rearing; managing money; managing financials (investments, etc.); meal preparation and cleanup; shopping for groceries and necessities; moving within the community; safety procedures and emergency responses; health management and maintenance (taking prescribed medications); and using the telephone or other form of communication.  Instructions:  Most patients tend to report their pain as a combination of two factors, their physical pain and their psychosocial pain. This last one is also known as "suffering" and it is reflection of how physical pain affects you socially and psychologically. From now on, report them separately.  From this point on, when asked to report your pain level, report only your physical pain. Use the following table for reference.  Pain Clinic Pain Levels (0-5/10)  Pain Level Score  Description  No Pain 0   Mild pain 1 Nagging, annoying, but does not interfere with basic activities of daily living (ADL). Patients are able to eat, bathe, get dressed, toileting (being able to get on and off the toilet and perform personal hygiene functions), transfer (move in and out of bed or a chair without assistance), and maintain continence (able to control bladder and bowel functions). Blood pressure and heart  rate are unaffected. A normal heart rate for a healthy adult ranges from 60 to 100 bpm (beats per  minute).   Mild to moderate pain 2 Noticeable and distracting. Impossible to hide from other people. More frequent flare-ups. Still possible to adapt and function close to normal. It can be very annoying and may have occasional stronger flare-ups. With discipline, patients may get used to it and adapt.   Moderate pain 3 Interferes significantly with activities of daily living (ADL). It becomes difficult to feed, bathe, get dressed, get on and off the toilet or to perform personal hygiene functions. Difficult to get in and out of bed or a chair without assistance. Very distracting. With effort, it can be ignored when deeply involved in activities.   Moderately severe pain 4 Impossible to ignore for more than a few minutes. With effort, patients may still be able to manage work or participate in some social activities. Very difficult to concentrate. Signs of autonomic nervous system discharge are evident: dilated pupils (mydriasis); mild sweating (diaphoresis); sleep interference. Heart rate becomes elevated (>115 bpm). Diastolic blood pressure (lower number) rises above 100 mmHg. Patients find relief in laying down and not moving.   Severe pain 5 Intense and extremely unpleasant. Associated with frowning face and frequent crying. Pain overwhelms the senses.  Ability to do any activity or maintain social relationships becomes significantly limited. Conversation becomes difficult. Pacing back and forth is common, as getting into a comfortable position is nearly impossible. Pain wakes you up from deep sleep. Physical signs will be obvious: pupillary dilation; increased sweating; goosebumps; brisk reflexes; cold, clammy hands and feet; nausea, vomiting or dry heaves; loss of appetite; significant sleep disturbance with inability to fall asleep or to remain asleep. When persistent, significant weight loss is observed due to the complete loss of appetite and sleep deprivation.  Blood pressure and heart rate  becomes significantly elevated. Caution: If elevated blood pressure triggers a pounding headache associated with blurred vision, then the patient should immediately seek attention at an urgent or emergency care unit, as these may be signs of an impending stroke.    Emergency Department Pain Levels (6-10/10)  Emergency Room Pain 6 Severely limiting. Requires emergency care and should not be seen or managed at an outpatient pain management facility. Communication becomes difficult and requires great effort. Assistance to reach the emergency department may be required. Facial flushing and profuse sweating along with potentially dangerous increases in heart rate and blood pressure will be evident.   Distressing pain 7 Self-care is very difficult. Assistance is required to transport, or use restroom. Assistance to reach the emergency department will be required. Tasks requiring coordination, such as bathing and getting dressed become very difficult.   Disabling pain 8 Self-care is no longer possible. At this level, pain is disabling. The individual is unable to do even the most "basic" activities such as walking, eating, bathing, dressing, transferring to a bed, or toileting. Fine motor skills are lost. It is difficult to think clearly.   Incapacitating pain 9 Pain becomes incapacitating. Thought processing is no longer possible. Difficult to remember your own name. Control of movement and coordination are lost.   The worst pain imaginable 10 At this level, most patients pass out from pain. When this level is reached, collapse of the autonomic nervous system occurs, leading to a sudden drop in blood pressure and heart rate. This in turn results in a temporary and dramatic drop in blood flow to the brain, leading to  a loss of consciousness. Fainting is one of the body's self defense mechanisms. Passing out puts the brain in a calmed state and causes it to shut down for a while, in order to begin the healing  process.    Summary: 1. Refer to this scale when providing [place] with your pain level. 2. Be accurate and careful when reporting your pain level. This will help with your care. 3. Over-reporting your pain level will lead to loss of credibility. 4. Even a level of 1/10 means that there is pain and will be treated at our facility. 5. High, inaccurate reporting will be documented as "Symptom Exaggeration", leading to loss of credibility and suspicions of possible secondary gains such as obtaining more narcotics, or wanting to appear disabled, for fraudulent reasons. 6. Only pain levels of 5 or below will be seen at our facility. 7. Pain levels of 6 and above will be sent to the Emergency Department and the appointment cancelled. ______________________________________________________________________________________________

## 2018-11-09 NOTE — Progress Notes (Signed)
Safety precautions to be maintained throughout the outpatient stay will include: orient to surroundings, keep bed in low position, maintain call bell within reach at all times, provide assistance with transfer out of bed and ambulation.  

## 2018-11-09 NOTE — Patient Instructions (Signed)
____________________________________________________________________________________________  Appointment Policy Summary  It is our goal and responsibility to provide the medical community with assistance in the evaluation and management of patients with chronic pain. Unfortunately our resources are limited. Because we do not have an unlimited amount of time, or available appointments, we are required to closely monitor and manage their use. The following rules exist to maximize their use:  Patient's responsibilities: 1. Punctuality:  At what time should I arrive? You should be physically present in our office 30 minutes before your scheduled appointment. Your scheduled appointment is with your assigned healthcare provider. However, it takes 5-10 minutes to be "checked-in", and another 15 minutes for the nurses to do the admission. If you arrive to our office at the time you were given for your appointment, you will end up being at least 20-25 minutes late to your appointment with the provider. 2. Tardiness:  What happens if I arrive only a few minutes after my scheduled appointment time? You will need to reschedule your appointment. The cutoff is your appointment time. This is why it is so important that you arrive at least 30 minutes before that appointment. If you have an appointment scheduled for 10:00 AM and you arrive at 10:01, you will be required to reschedule your appointment.  3. Plan ahead:  Always assume that you will encounter traffic on your way in. Plan for it. If you are dependent on a driver, make sure they understand these rules and the need to arrive early. 4. Other appointments and responsibilities:  Avoid scheduling any other appointments before or after your pain clinic appointments.  5. Be prepared:  Write down everything that you need to discuss with your healthcare provider and give this information to the admitting nurse. Write down the medications that you will need  refilled. Bring your pills and bottles (even the empty ones), to all of your appointments, except for those where a procedure is scheduled. 6. No children or pets:  Find someone to take care of them. It is not appropriate to bring them in. 7. Scheduling changes:  We request "advanced notification" of any changes or cancellations. 8. Advanced notification:  Defined as a time period of more than 24 hours prior to the originally scheduled appointment. This allows for the appointment to be offered to other patients. 9. Rescheduling:  When a visit is rescheduled, it will require the cancellation of the original appointment. For this reason they both fall within the category of "Cancellations".  10. Cancellations:  They require advanced notification. Any cancellation less than 24 hours before the  appointment will be recorded as a "No Show". 11. No Show:  Defined as an unkept appointment where the patient failed to notify or declare to the practice their intention or inability to keep the appointment.  Corrective process for repeat offenders:  1. Tardiness: Three (3) episodes of rescheduling due to late arrivals will be recorded as one (1) "No Show". 2. Cancellation or reschedule: Three (3) cancellations or rescheduling will be recorded as one (1) "No Show". 3. "No Shows": Three (3) "No Shows" within a 12 month period will result in discharge from the practice. ____________________________________________________________________________________________   ______________________________________________________________________________________________  Specialty Pain Scale  Introduction:  There are significant differences in how pain is reported. The word pain usually refers to physical pain, but it is also a common synonym of suffering. The medical community uses a scale from 0 (zero) to 10 (ten) to report pain level. Zero (0) is described as "no pain",   while ten (10) is described as "the worse pain  you can imagine". The problem with this scale is that physical pain is reported along with suffering. Suffering refers to mental pain, or more often yet it refers to any unpleasant feeling, emotion or aversion associated with the perception of harm or threat of harm. It is the psychological component of pain.  Pain Specialists prefer to separate the two components. The pain scale used by this practice is the Verbal Numerical Rating Scale (VNRS-11). This scale is for the physical pain only. DO NOT INCLUDE how your pain psychologically affects you. This scale is for adults 74 years of age and older. It has 11 (eleven) levels. The 1st level is 0/10. This means: "right now, I have no pain". In the context of pain management, it also means: "right now, my physical pain is under control with the current therapy".  General Information:  The scale should reflect your current level of pain. Unless you are specifically asked for the level of your worst pain, or your average pain. If you are asked for one of these two, then it should be understood that it is over the past 24 hours.  Levels 1 (one) through 5 (five) are described below, and can be treated as an outpatient. Ambulatory pain management facilities such as ours are more than adequate to treat these levels. Levels 6 (six) through 10 (ten) are also described below, however, these must be treated as a hospitalized patient. While levels 6 (six) and 7 (seven) may be evaluated at an urgent care facility, levels 8 (eight) through 10 (ten) constitute medical emergencies and as such, they belong in a hospital's emergency department. When having these levels (as described below), do not come to our office. Our facility is not equipped to manage these levels. Go directly to an urgent care facility or an emergency department to be evaluated.  Definitions:  Activities of Daily Living (ADL): Activities of daily living (ADL or ADLs) is a term used in healthcare to refer to  people's daily self-care activities. Health professionals often use a person's ability or inability to perform ADLs as a measurement of their functional status, particularly in regard to people post injury, with disabilities and the elderly. There are two ADL levels: Basic and Instrumental. Basic Activities of Daily Living (BADL  or BADLs) consist of self-care tasks that include: Bathing and showering; personal hygiene and grooming (including brushing/combing/styling hair); dressing; Toilet hygiene (getting to the toilet, cleaning oneself, and getting back up); eating and self-feeding (not including cooking or chewing and swallowing); functional mobility, often referred to as "transferring", as measured by the ability to walk, get in and out of bed, and get into and out of a chair; the broader definition (moving from one place to another while performing activities) is useful for people with different physical abilities who are still able to get around independently. Basic ADLs include the things many people do when they get up in the morning and get ready to go out of the house: get out of bed, go to the toilet, bathe, dress, groom, and eat. On the average, loss of function typically follows a particular order. Hygiene is the first to go, followed by loss of toilet use and locomotion. The last to go is the ability to eat. When there is only one remaining area in which the person is independent, there is a 62.9% chance that it is eating and only a 3.5% chance that it is hygiene. Instrumental Activities   of Daily Living (IADL or IADLs) are not necessary for fundamental functioning, but they let an individual live independently in a community. IADL consist of tasks that include: cleaning and maintaining the house; home establishment and maintenance; care of others (including selecting and supervising caregivers); care of pets; child rearing; managing money; managing financials (investments, etc.); meal preparation  and cleanup; shopping for groceries and necessities; moving within the community; safety procedures and emergency responses; health management and maintenance (taking prescribed medications); and using the telephone or other form of communication.  Instructions:  Most patients tend to report their pain as a combination of two factors, their physical pain and their psychosocial pain. This last one is also known as "suffering" and it is reflection of how physical pain affects you socially and psychologically. From now on, report them separately.  From this point on, when asked to report your pain level, report only your physical pain. Use the following table for reference.  Pain Clinic Pain Levels (0-5/10)  Pain Level Score  Description  No Pain 0   Mild pain 1 Nagging, annoying, but does not interfere with basic activities of daily living (ADL). Patients are able to eat, bathe, get dressed, toileting (being able to get on and off the toilet and perform personal hygiene functions), transfer (move in and out of bed or a chair without assistance), and maintain continence (able to control bladder and bowel functions). Blood pressure and heart rate are unaffected. A normal heart rate for a healthy adult ranges from 60 to 100 bpm (beats per minute).   Mild to moderate pain 2 Noticeable and distracting. Impossible to hide from other people. More frequent flare-ups. Still possible to adapt and function close to normal. It can be very annoying and may have occasional stronger flare-ups. With discipline, patients may get used to it and adapt.   Moderate pain 3 Interferes significantly with activities of daily living (ADL). It becomes difficult to feed, bathe, get dressed, get on and off the toilet or to perform personal hygiene functions. Difficult to get in and out of bed or a chair without assistance. Very distracting. With effort, it can be ignored when deeply involved in activities.   Moderately severe pain  4 Impossible to ignore for more than a few minutes. With effort, patients may still be able to manage work or participate in some social activities. Very difficult to concentrate. Signs of autonomic nervous system discharge are evident: dilated pupils (mydriasis); mild sweating (diaphoresis); sleep interference. Heart rate becomes elevated (>115 bpm). Diastolic blood pressure (lower number) rises above 100 mmHg. Patients find relief in laying down and not moving.   Severe pain 5 Intense and extremely unpleasant. Associated with frowning face and frequent crying. Pain overwhelms the senses.  Ability to do any activity or maintain social relationships becomes significantly limited. Conversation becomes difficult. Pacing back and forth is common, as getting into a comfortable position is nearly impossible. Pain wakes you up from deep sleep. Physical signs will be obvious: pupillary dilation; increased sweating; goosebumps; brisk reflexes; cold, clammy hands and feet; nausea, vomiting or dry heaves; loss of appetite; significant sleep disturbance with inability to fall asleep or to remain asleep. When persistent, significant weight loss is observed due to the complete loss of appetite and sleep deprivation.  Blood pressure and heart rate becomes significantly elevated. Caution: If elevated blood pressure triggers a pounding headache associated with blurred vision, then the patient should immediately seek attention at an urgent or emergency care unit, as   these may be signs of an impending stroke.    Emergency Department Pain Levels (6-10/10)  Emergency Room Pain 6 Severely limiting. Requires emergency care and should not be seen or managed at an outpatient pain management facility. Communication becomes difficult and requires great effort. Assistance to reach the emergency department may be required. Facial flushing and profuse sweating along with potentially dangerous increases in heart rate and blood pressure  will be evident.   Distressing pain 7 Self-care is very difficult. Assistance is required to transport, or use restroom. Assistance to reach the emergency department will be required. Tasks requiring coordination, such as bathing and getting dressed become very difficult.   Disabling pain 8 Self-care is no longer possible. At this level, pain is disabling. The individual is unable to do even the most "basic" activities such as walking, eating, bathing, dressing, transferring to a bed, or toileting. Fine motor skills are lost. It is difficult to think clearly.   Incapacitating pain 9 Pain becomes incapacitating. Thought processing is no longer possible. Difficult to remember your own name. Control of movement and coordination are lost.   The worst pain imaginable 10 At this level, most patients pass out from pain. When this level is reached, collapse of the autonomic nervous system occurs, leading to a sudden drop in blood pressure and heart rate. This in turn results in a temporary and dramatic drop in blood flow to the brain, leading to a loss of consciousness. Fainting is one of the body's self defense mechanisms. Passing out puts the brain in a calmed state and causes it to shut down for a while, in order to begin the healing process.    Summary: 1.   Refer to this scale when providing us with your pain level. 2.   Be accurate and careful when reporting your pain level. This will help with your care. 3.   Over-reporting your pain level will lead to loss of credibility. 4.   Even a level of 1/10 means that there is pain and will be treated at our facility. 5.   High, inaccurate reporting will be documented as "Symptom Exaggeration", leading to loss of credibility and suspicions of possible secondary gains such as obtaining more narcotics, or wanting to appear disabled, for fraudulent reasons. 6.   Only pain levels of 5 or below will be seen at our facility. 7.   Pain levels of 6 and above will be  sent to the Emergency Department and the appointment cancelled.  ______________________________________________________________________________________________     

## 2018-11-10 LAB — COMP. METABOLIC PANEL (12)
AST: 11 IU/L (ref 0–40)
Albumin/Globulin Ratio: 1.3 (ref 1.2–2.2)
Albumin: 3.9 g/dL (ref 3.7–4.7)
Alkaline Phosphatase: 91 IU/L (ref 39–117)
BUN / CREAT RATIO: 10 — AB (ref 12–28)
BUN: 9 mg/dL (ref 8–27)
Bilirubin Total: 0.4 mg/dL (ref 0.0–1.2)
CREATININE: 0.86 mg/dL (ref 0.57–1.00)
Calcium: 9.4 mg/dL (ref 8.7–10.3)
Chloride: 99 mmol/L (ref 96–106)
GFR calc Af Amer: 77 mL/min/{1.73_m2} (ref >59)
GFR calc non Af Amer: 67 mL/min/{1.73_m2} (ref >59)
Globulin, Total: 3.1 g/dL (ref 1.5–4.5)
Glucose: 188 mg/dL — ABNORMAL HIGH (ref 65–99)
Potassium: 3.9 mmol/L (ref 3.5–5.2)
Sodium: 138 mmol/L (ref 134–144)
TOTAL PROTEIN: 7 g/dL (ref 6.0–8.5)

## 2018-11-10 LAB — MAGNESIUM: Magnesium: 2 mg/dL (ref 1.6–2.3)

## 2018-11-10 LAB — C-REACTIVE PROTEIN: CRP: 16 mg/L — ABNORMAL HIGH (ref 0–10)

## 2018-11-10 LAB — SEDIMENTATION RATE: Sed Rate: 77 mm/hr — ABNORMAL HIGH (ref 0–40)

## 2018-11-12 ENCOUNTER — Telehealth: Payer: Self-pay

## 2018-11-12 LAB — COMPLIANCE DRUG ANALYSIS, UR

## 2018-11-12 NOTE — Telephone Encounter (Signed)
Call pt regarding lung screening. Left message for pt to return call.  

## 2018-11-15 ENCOUNTER — Other Ambulatory Visit: Payer: Self-pay | Admitting: Nurse Practitioner

## 2018-11-15 ENCOUNTER — Encounter: Payer: Self-pay | Admitting: Nurse Practitioner

## 2018-11-15 DIAGNOSIS — R7982 Elevated C-reactive protein (CRP): Secondary | ICD-10-CM | POA: Insufficient documentation

## 2018-11-15 DIAGNOSIS — R700 Elevated erythrocyte sedimentation rate: Secondary | ICD-10-CM | POA: Insufficient documentation

## 2018-11-17 LAB — RHEUMATOID FACTOR: Rheumatoid fact SerPl-aCnc: <10 IU/mL (ref 0.0–13.9)

## 2018-11-17 LAB — ANA W/REFLEX IF POSITIVE: Anti Nuclear Antibody(ANA): NEGATIVE

## 2018-11-17 LAB — SPECIMEN STATUS REPORT

## 2018-11-18 ENCOUNTER — Ambulatory Visit
Admission: RE | Admit: 2018-11-18 | Discharge: 2018-11-18 | Disposition: A | Discharge status: Home / Self Care | Payer: Medicare Other | Source: Ambulatory Visit | Attending: Nurse Practitioner | Admitting: Nurse Practitioner

## 2018-11-18 ENCOUNTER — Telehealth: Payer: Self-pay | Admitting: *Deleted

## 2018-11-18 DIAGNOSIS — M25511 Pain in right shoulder: Secondary | ICD-10-CM | POA: Insufficient documentation

## 2018-11-18 DIAGNOSIS — G8929 Other chronic pain: Secondary | ICD-10-CM

## 2018-11-18 DIAGNOSIS — M25512 Pain in left shoulder: Secondary | ICD-10-CM | POA: Insufficient documentation

## 2018-11-18 DIAGNOSIS — Z87891 Personal history of nicotine dependence: Secondary | ICD-10-CM

## 2018-11-18 DIAGNOSIS — M19012 Primary osteoarthritis, left shoulder: Secondary | ICD-10-CM | POA: Diagnosis not present

## 2018-11-18 DIAGNOSIS — Z122 Encounter for screening for malignant neoplasm of respiratory organs: Secondary | ICD-10-CM

## 2018-11-18 NOTE — Telephone Encounter (Signed)
Patient has been notified that annual lung cancer screening low dose CT scan is due currently or will be in near future. Confirmed that patient is within the age range of 55-77, and asymptomatic, (no signs or symptoms of lung cancer). Patient denies illness that would prevent curative treatment for lung cancer if found. Verified smoking history, (current, 53.5 pack year). The shared decision making visit was done 03/27/14. Patient is agreeable for CT scan being scheduled.

## 2018-11-22 NOTE — Progress Notes (Signed)
Results were reviewed and found to be: mildly abnormal  No acute injury or pathology identified  Review would suggest interventional pain management techniques may be of benefit 

## 2018-11-25 NOTE — Progress Notes (Signed)
Patient's Name: Lynn Potter  MRN: [id number]  Referring Provider: Remi Haggard, FNP  DOB: 1946-08-04  PCP: Remi Haggard, FNP  DOS: 11/29/2018  Note by: Gaspar Cola, MD  Service setting: Ambulatory outpatient  Specialty: Interventional Pain Management  Location: [facility] (AMB) Pain Management Facility    Patient type: Established   Primary Reason(s) for Visit: Encounter for evaluation before starting new chronic pain management plan of care (Level of risk: moderate) CC: Back Pain (low) and Leg Pain (left)  HPI  [name] is a 73 y.o. year old, female patient, who comes today for a follow-up evaluation to review the test results and decide on a treatment plan. She has Personal history of tobacco use, presenting hazards to health; Sepsis ([place]); Osteoarthritis of shoulder region; Primary osteoarthritis of right shoulder; Rotator cuff tendinitis; History of rotator cuff surgery (Bilateral); Chronic pain syndrome; Encounter for long-term opiate analgesic use; Long term current use of opiate analgesic; Opiate use; Degenerative joint disease (DJD) of hip; Sacroiliac joint pain; Low back pain (primary )(left greater than right); Vitamin D insufficiency; Hyperglycemia; Acute diverticulitis; Chronic bilateral low back pain with left-sided sciatica (Primary Area of Pain); Chronic pain of left lower extremity (Secondary Area of Pain); Chronic pain of both shoulders (Tertiary Area of Pain) (R>L); Pharmacologic therapy; Disorder of skeletal system; Problems influencing health status; Elevated C-reactive protein (CRP); Elevated sed rate; Chronic hip pain (Left); Osteoarthritis of hip (Left); Lumbar facet syndrome (B) (L>R); and Vitamin B12 deficiency on their problem list. Her primarily concern today is the Back Pain (low) and Leg Pain (left)  Pain Assessment: Location: Lower Back Radiating: left lateral and posterior leg Onset: More than a month ago Duration: Chronic pain Quality: Aching, Constant,  Throbbing Severity: 8 /10 (subjective, self-reported pain score)  Note: Reported level is inconsistent with clinical observations. Clinically the patient looks like a 3/10 A 3/10 is viewed as "Moderate" and described as significantly interfering with activities of daily living (ADL). It becomes difficult to feed, bathe, get dressed, get on and off the toilet or to perform personal hygiene functions. Difficult to get in and out of bed or a chair without assistance. Very distracting. With effort, it can be ignored when deeply involved in activities. Information on the proper use of the pain scale provided to the patient today. When using our objective Pain Scale, levels between 6 and 10/10 are said to belong in an emergency room, as it progressively worsens from a 6/10, described as severely limiting, requiring emergency care not usually available at an outpatient pain management facility. At a 6/10 level, communication becomes difficult and requires great effort. Assistance to reach the emergency department may be required. Facial flushing and profuse sweating along with potentially dangerous increases in heart rate and blood pressure will be evident. Timing: Constant Modifying factors: heat BP: 131/71  HR: 70  [name] comes in today for a follow-up visit after her initial evaluation on 11/09/2018. Today we went over the results of her tests. These were explained in "Layman's terms". During today's appointment we went over my diagnostic impression, as well as the proposed treatment plan.  According to the patient her primary area of pain is in her lower back (B) (L>R).  She admits this is from an accident 35.  Her pain is worse on the left side.  She has had previous surgery X2, the second by Dr. Mauri Pole at [facility].  She has had steroid injections in the past which were effective for  a while by Dr. Sharlet Salina and Dr. Primus Bravo.  She denies any recent physical therapy but has had recent images. Surgeries  were done to treat "back pain". The leg pain started 2-3 years ago.  Her second area of pain is in her left leg (L).  She admits the pain goes down the back of her leg and rotates around to the front and top of her foot affecting her toes (big toe and one next to it) (L5).  She has numbness tingling and weakness.  She denies a previous nerve conduction study.  Third area of pain is the hips (B) (R>L). No surgeries. Had left groin injection at [facility], a couple of months ago, it did help for a while with the cramps over the proximal thigh and the calf, on the left leg, but it wore off.  Her fourth area pain is in her shoulders (B) (R>L).  She admits that the right is greater than the left.  She has had previous rotator cuff surgery.  She denies any numbness, tingling or weakness in her arms.  Has had bilateral TKR.  In considering the treatment plan options, [name] was reminded that I no longer take patients for medication management only. I asked her to let me know if she had no intention of taking advantage of the interventional therapies, so that we could make arrangements to provide this space to someone interested. I also made it clear that undergoing interventional therapies for the purpose of getting pain medications is very inappropriate on the part of a patient, and it will not be tolerated in this practice. This type of behavior would suggest true addiction and therefore it requires referral to an addiction specialist.   Further details on both, my assessment(s), as well as the proposed treatment plan, please see below.  Controlled Substance Pharmacotherapy Assessment REMS (Risk Evaluation and Mitigation Strategy)  Analgesic: None Highest recorded MME/day: 90 mg/day MME/day: 0 mg/day  Pill Count: None expected due to no prior prescriptions written by our practice. Hart Rochester, RN  11/29/2018  9:48 AM  Sign when Signing Visit Safety precautions to be maintained  throughout the outpatient stay will include: orient to surroundings, keep bed in low position, maintain call bell within reach at all times, provide assistance with transfer out of bed and ambulation.    Pharmacokinetics: Liberation and absorption (onset of action): WNL Distribution (time to peak effect): WNL Metabolism and excretion (duration of action): WNL         Pharmacodynamics: Desired effects: Analgesia: Ms. Bohle reports >50% benefit. Functional ability: Patient reports that medication allows her to accomplish basic ADLs Clinically meaningful improvement in function (CMIF): Sustained CMIF goals met Perceived effectiveness: Described as relatively effective, allowing for increase in activities of daily living (ADL) Undesirable effects: Side-effects or Adverse reactions: None reported Monitoring: [place] PMP: Online review of the past 57-monthperiod previously conducted. Not applicable at this point since we have not taken over the patient's medication management yet. List of other Serum/Urine Drug Screening Test(s):  No results found. List of all UDS test(s) done:  Lab Results  Component Value Date   SUMMARY FINAL 11/09/2018   SUMMARY FINAL 03/16/2017   Last UDS on record: Summary  Date Value Ref Range Status  11/09/2018 FINAL  Final    Comment:    ==================================================================== TOXASSURE COMP DRUG ANALYSIS,UR ==================================================================== Test  Result       Flag       Units Drug Present and Declared for Prescription Verification   Pregabalin                     PRESENT      EXPECTED   Baclofen                       PRESENT      EXPECTED   Methocarbamol                  PRESENT      EXPECTED   Hydroxyzine                    PRESENT      EXPECTED Drug Absent but Declared for Prescription Verification   Salicylate                     Not Detected UNEXPECTED    Aspirin, as  indicated in the declared medication list, is not    always detected even when used as directed. ==================================================================== Test                      Result    Flag   Units      Ref Range   Creatinine              58               mg/dL      >=20 ==================================================================== Declared Medications:  The flagging and interpretation on this report are based on the  following declared medications.  Unexpected results may arise from  inaccuracies in the declared medications.  **Note: The testing scope of this panel includes these medications:  Baclofen  Hydroxyzine  Methocarbamol  Pregabalin (Lyrica)  **Note: The testing scope of this panel does not include small to  moderate amounts of these reported medications:  Aspirin (Aspirin 81)  **Note: The testing scope of this panel does not include following  reported medications:  Amlodipine  Ascorbic Acid  Atorvastatin  Azelastine  Benazepril  Cholecalciferol  Empagliflozin (Jardiance)  Fluconazole (Diflucan)  Insulin  Iron (Ferrous Sulfate)  Latanoprost  Metformin  Olmesartan (Benicar)  Pantoprazole  Ropinirole (Requip) ==================================================================== For clinical consultation, please call [phone]. ====================================================================    UDS interpretation: No unexpected findings.          Medication Assessment Form: Patient introduced to form today Treatment compliance: Treatment may start today if patient agrees with proposed plan. Evaluation of compliance is not applicable at this point Risk Assessment Profile: Aberrant behavior: See initial evaluations. None observed or detected today Comorbid factors increasing risk of overdose: See initial evaluation. No additional risks detected today Opioid risk tool (ORT):  Opioid Risk  11/09/2018  Alcohol 0  Illegal Drugs 0  Rx  Drugs 0  Alcohol 0  Illegal Drugs 0  Rx Drugs 0  Age between 16-45 years  0  History of Preadolescent Sexual Abuse 0  Psychological Disease 0  Depression 0  Opioid Risk Tool Scoring 0  Opioid Risk Interpretation Low Risk    ORT Scoring interpretation table:  Score <3 = Low Risk for SUD  Score between 4-7 = Moderate Risk for SUD  Score >8 = High Risk for Opioid Abuse   Risk of substance use disorder (SUD): Low  Risk Mitigation Strategies:  Patient opioid safety counseling: Completed today.  Counseling provided to patient as per "Patient Counseling Document". Document signed by patient, attesting to counseling and understanding Patient-Prescriber Agreement (PPA): Obtained today.  Controlled substance notification to other providers: Written and sent today.  Pharmacologic Plan: Today we may be taking over the patient's pharmacological regimen. See below.             Laboratory Chemistry  Inflammation Markers (CRP: Acute Phase) (ESR: Chronic Phase) Lab Results  Component Value Date   CRP 16 (H) 11/09/2018   ESRSEDRATE 77 (H) 11/09/2018   LATICACIDVEN 1.9 09/30/2016                         Rheumatology Markers Lab Results  Component Value Date   RF <10.0 11/09/2018   ANA Negative 11/09/2018                        Renal Function Markers Lab Results  Component Value Date   BUN 9 11/09/2018   CREATININE 0.86 11/09/2018   BCR 10 (L) 11/09/2018   GFRAA 77 11/09/2018   GFRNONAA 67 11/09/2018                             Hepatic Function Markers Lab Results  Component Value Date   AST 11 11/09/2018   ALT 152 (H) 05/24/2018   ALBUMIN 3.9 11/09/2018   ALKPHOS 91 11/09/2018   LIPASE 21 05/23/2018                        Electrolytes Lab Results  Component Value Date   NA 138 11/09/2018   K 3.9 11/09/2018   CL 99 11/09/2018   CALCIUM 9.4 11/09/2018   MG 2.0 11/09/2018                        Neuropathy Markers Lab Results  Component Value Date   VITAMINB12 182  (L) 03/16/2017   HGBA1C 6.1 (H) 05/24/2018                        CNS Tests No results found.  Bone Pathology Markers Lab Results  Component Value Date   25OHVITD1 21 (L) 03/16/2017   25OHVITD2 <1.0 03/16/2017   25OHVITD3 21 03/16/2017                         Coagulation Parameters Lab Results  Component Value Date   INR 0.92 06/09/2017   LABPROT 12.3 06/09/2017   APTT 24 06/09/2017   PLT 232 05/25/2018                        Cardiovascular Markers Lab Results  Component Value Date   TROPONINI <0.03 05/23/2018   HGB 10.7 (L) 05/25/2018   HCT 32.1 (L) 05/25/2018                         CA Markers Lab Results  Component Value Date   CEA 3.7 08/01/2013                        Endocrine Markers Lab Results  Component Value Date   TSH 0.838 05/24/2018  Note: Lab results reviewed.  Recent Diagnostic Imaging Review  Shoulder Imaging: Shoulder-R DG:  Results for orders placed during the hospital encounter of 11/18/18  DG Shoulder Right   Narrative CLINICAL DATA:  Chronic BILATERAL shoulder pain for many years, no recent injury  EXAM: RIGHT SHOULDER - 2+ VIEW  COMPARISON:  11/26/2017  FINDINGS: Osseous demineralization.  Suboptimal profile of the RIGHT AC joint but demonstrating degenerative changes and suspect mild subluxation versus previous exam.  Additional RIGHT glenohumeral degenerative changes with joint space narrowing and minimal spurring at the humeral head.  No fracture, dislocation or bone destruction.  Visualized RIGHT ribs intact.  IMPRESSION: Degenerative changes of RIGHT acromioclavicular and glenohumeral joints.  Suspect subluxation at RIGHT [facility] joint versus prior study.   Electronically Signed   By: Lavonia Dana M.D.   On: 11/19/2018 09:11    Shoulder-L DG:  Results for orders placed during the hospital encounter of 11/18/18  DG Shoulder Left   Narrative CLINICAL DATA:  Chronic BILATERAL shoulder pain  for many years  EXAM: LEFT SHOULDER - 2+ VIEW  COMPARISON:  None  Correlation: MR LEFT shoulder 09/15/2010  FINDINGS: Osseous demineralization.  AC joint alignment normal.  LEFT glenohumeral degenerative changes with joint space narrowing and spur formation.  No acute fracture, dislocation, or bone destruction.  Visualized LEFT ribs unremarkable.  IMPRESSION: Osseous demineralization with LEFT glenohumeral degenerative changes.   Electronically Signed   By: Lavonia Dana M.D.   On: 11/19/2018 09:12    Lumbosacral Imaging: Lumbar MR wo contrast:  Results for orders placed during the hospital encounter of 09/26/18  MR LUMBAR SPINE WO CONTRAST   Narrative CLINICAL DATA:  Low back pain with bilateral leg pain  EXAM: MRI LUMBAR SPINE WITHOUT CONTRAST  TECHNIQUE: Multiplanar, multisequence MR imaging of the lumbar spine was performed. No intravenous contrast was administered.  COMPARISON:  MRI lumbar spine 06/18/2007  FINDINGS: Segmentation:  Normal  Alignment:  Mild anterolisthesis L3-4.  Remaining alignment normal.  Vertebrae:  Negative for fracture or mass.  No bone marrow edema.  Conus medullaris and cauda equina: Conus extends to the L1-2 level. Conus and cauda equina appear normal.  Paraspinal and other soft tissues: Negative for paraspinous mass or fluid collection  Disc levels:  T11-12: Disc degeneration and facet degeneration with mild spinal stenosis and mild foraminal narrowing bilaterally  T12-L1: Disc and facet degeneration without significant stenosis  L1-2: Mild disc bulging and mild facet degeneration without significant stenosis  L2-3: Mild disc degeneration and moderate facet degeneration. Mild spinal stenosis  L3-4: Marked progression of disc and facet degeneration since the prior study. Severe spinal stenosis has progressed significantly. Severe subarticular stenosis bilaterally with severe left foraminal encroachment. Small  posterior synovial cyst on the right.  L4-5: Pedicle screw and interbody fusion. Solid fusion. Posterior decompression without stenosis  L5-S1: Solid pedicle screw fusion with posterior decompression. Negative for stenosis.  IMPRESSION: Marked progression of disc and facet degeneration at L3-4. Severe spinal stenosis at L3-4 has progressed.  Solid fusion L4-5 and L5-S1 without stenosis.   Electronically Signed   By: Franchot Gallo M.D.   On: 09/26/2018 14:33    Lumbar CT wo contrast:  Results for orders placed during the hospital encounter of 10/11/18  CT LUMBAR SPINE WO CONTRAST   Narrative CLINICAL DATA:  LEFT hip and lower back pain. No recent fall.  EXAM: CT LUMBAR SPINE WITHOUT CONTRAST  TECHNIQUE: Multidetector CT imaging of the lumbar spine was performed without intravenous contrast administration. Multiplanar  CT image reconstructions were also generated.  COMPARISON:  MRI 09/26/2018.  FINDINGS: Segmentation: Standard  Alignment: Grade 1 anterolisthesis L3-4, estimated 3 mm. Minor compensatory retrolisthesis thoracolumbar junction. Straightening of the normal lumbar lordosis across the L4-S1 fusion segments.  Vertebrae: No acute fracture or focal pathologic process.  Paraspinal and other soft tissues: Unremarkable. Aortic atherosclerosis.  Disc levels:  L1-L2: Trace retrolisthesis. Annular bulge. Facet disease. No stenosis.  L2-L3: Preserved disc height. Annular bulge. Moderate facet disease. No impingement.  L3-L4: Advanced disc space narrowing with vacuum phenomenon. Osseous spurring. 3 mm anterolisthesis. Marked facet overgrowth. Severe spinal stenosis despite posterior decompression. Apparent residual ligamentum flavum on the RIGHT. BILATERAL L4 and L3 neural impingement.  L4-L5:  Unremarkable post fusion interspace.  L5-S1:  Unremarkable post fusion interspace.  IMPRESSION: 1. Severe spinal stenosis representing adjacent segment disease  at L3-4 despite posterior decompression. 3 mm slip. Annular bulge with osseous spurring. Posterior element hypertrophy. BILATERAL L4 and L3 neural impingement. 2. Unremarkable L4 through S1 fusion.   Electronically Signed   By: Staci Righter M.D.   On: 10/11/2018 14:36    Sacroiliac Joint Imaging: Sacroiliac Joint DG:  Results for orders placed during the hospital encounter of 03/16/17  DG Si Joints   Narrative CLINICAL DATA:  Chronic pain syndrome  EXAM: BILATERAL SACROILIAC JOINTS - 3+ VIEW  COMPARISON:  07/06/2013  FINDINGS: Partially visualized lower spinal hardware. The SI joints show mild sclerosis but appear patent. There is minimal spurring inferiorly.  IMPRESSION: Mild SI joint disease.   Electronically Signed   By: Donavan Foil M.D.   On: 03/16/2017 21:39    Hip Imaging: Hip-R DG 2-3 views:  Results for orders placed during the hospital encounter of 03/16/17  DG HIP UNILAT W OR W/O PELVIS 2-3 VIEWS RIGHT   Narrative CLINICAL DATA:  Chronic pain syndrome  EXAM: DG HIP (WITH OR WITHOUT PELVIS) 2-3V RIGHT  COMPARISON:  None.  FINDINGS: No fracture or malalignment. Surgical clips in the right pelvis. Mild joint space narrowing.  IMPRESSION: Mild arthritis of the right hip.  No acute osseous abnormality   Electronically Signed   By: Donavan Foil M.D.   On: 03/16/2017 21:37    Hip-L DG 2-3 views:  Results for orders placed during the hospital encounter of 03/16/17  DG HIP UNILAT W OR W/O PELVIS 2-3 VIEWS LEFT   Narrative CLINICAL DATA:  Chronic pain syndrome  EXAM: DG HIP (WITH OR WITHOUT PELVIS) 2-3V LEFT  COMPARISON:  None.  FINDINGS: Surgical clips in the pelvis. Pubic symphysis is intact. Mild SI joint disease. Partially visualized lumbar spine hardware.  Mild arthritis of the right hip.  Moderate to marked degenerative changes of the left hip with joint space narrowing and mild subarticular sclerosis. No acute fracture or  malalignment  IMPRESSION: Moderate to marked arthritis of the left hip. No acute osseous abnormality.   Electronically Signed   By: Donavan Foil M.D.   On: 03/16/2017 21:37    Foot Imaging: Foot-R DG Complete:  Results for orders placed in visit on 03/10/17  DG Foot Complete Right   Narrative Please see detailed radiograph report in office note.   Foot-L DG Complete:  Results for orders placed in visit on 03/10/17  DG Foot Complete Left   Narrative Please see detailed radiograph report in office note.   Complexity Note: Imaging results reviewed. Results shared with Ms. Langer, using Layman's terms.  Meds   Current Outpatient Medications:  .  ACCU-CHEK AVIVA PLUS test strip, , Disp: , Rfl:  .  ACCU-CHEK SOFTCLIX LANCETS lancets, 2 (two) times daily., Disp: , Rfl:  .  amLODipine (NORVASC) 10 MG tablet, Take 10 mg by mouth daily., Disp: , Rfl: 5 .  Ascorbic Acid (VITAMIN C) 1000 MG tablet, Take 1,000 mg by mouth daily., Disp: , Rfl:  .  aspirin 81 MG tablet, Take 81 mg by mouth daily., Disp: , Rfl:  .  atorvastatin (LIPITOR) 40 MG tablet, Take 1 tablet (40 mg total) by mouth daily., Disp: 30 tablet, Rfl: 0 .  azelastine (ASTELIN) 0.1 % nasal spray, Place 1 spray into both nostrils 2 (two) times daily as needed for rhinitis or allergies. , Disp: , Rfl: 11 .  baclofen (LIORESAL) 10 MG tablet, 10 mg 2 (two) times daily. , Disp: , Rfl:  .  Blood Glucose Monitoring Suppl (ACCU-CHEK AVIVA PLUS) w/Device KIT, EVERY DAY, Disp: , Rfl:  .  Cholecalciferol (D-5000) 5000 units TABS, Take 5,000 Units by mouth daily. , Disp: , Rfl:  .  ferrous sulfate 325 (65 FE) MG EC tablet, Take 325 mg by mouth 2 (two) times daily. , Disp: , Rfl:  .  fluconazole (DIFLUCAN) 150 MG tablet, Take 150 mg by mouth daily., Disp: , Rfl: 0 .  hydrOXYzine (VISTARIL) 25 MG capsule, Take 25 mg by mouth every 12 (twelve) hours. , Disp: , Rfl: 0 .  insulin aspart (NOVOLOG) 100 UNIT/ML injection,  Inject 0-9 Units into the skin 3 (three) times daily with meals., Disp: 10 mL, Rfl: 11 .  insulin lispro (HUMALOG KWIKPEN) 100 UNIT/ML KwikPen, Inject into the skin., Disp: , Rfl:  .  JARDIANCE 25 MG TABS tablet, Take 25 mg by mouth daily. , Disp: , Rfl:  .  latanoprost (XALATAN) 0.005 % ophthalmic solution, Place 1 drop into both eyes at bedtime. , Disp: , Rfl:  .  LEVEMIR FLEXTOUCH 100 UNIT/ML Pen, Inject 25 Units into the skin daily. Patient reports she only takes Levemir if her glucose is >150 mg/dl which is rare for her, Disp: , Rfl: 5 .  metFORMIN (GLUCOPHAGE) 500 MG tablet, Take 1 tablet (500 mg total) by mouth 2 (two) times daily with a meal., Disp: 60 tablet, Rfl: 0 .  methocarbamol (ROBAXIN) 500 MG tablet, Take 500 mg by mouth 3 (three) times daily., Disp: , Rfl:  .  olmesartan-hydrochlorothiazide (BENICAR HCT) 40-25 MG tablet, Take 1 tablet by mouth daily., Disp: , Rfl: 5 .  pantoprazole (PROTONIX) 40 MG tablet, Take 1 tablet by mouth 2 (two) times daily. , Disp: , Rfl:  .  pregabalin (LYRICA) 150 MG capsule, Take 150 mg by mouth 2 (two) times daily. , Disp: , Rfl:   ROS  Constitutional: Denies any fever or chills Gastrointestinal: No reported hemesis, hematochezia, vomiting, or acute GI distress Musculoskeletal: Denies any acute onset joint swelling, redness, loss of ROM, or weakness Neurological: No reported episodes of acute onset apraxia, aphasia, dysarthria, agnosia, amnesia, paralysis, loss of coordination, or loss of consciousness  Allergies  Ms. Nelson has No Known Allergies.  [place]  Drug: Ms. Scheidegger  reports no history of drug use. Alcohol:  reports no history of alcohol use. Tobacco:  reports that she has been smoking cigarettes. She has been smoking about 1.00 pack per day. She has never used smokeless tobacco. Medical:  has a past medical history of AF (atrial fibrillation) ([place]), Arthritis, Asthma, Chicken pox, Diabetes mellitus without complication (  [place]), Glaucoma,  Hemorrhoids, History of hiatal hernia, Hypertension, Migraines, Obesity, Opiate use (03/16/2017), Personal history of tobacco use, presenting hazards to health (06/18/2015), and Sleep apnea. Surgical: Ms. Rosch  has a past surgical history that includes Joint replacement; Total knee arthroplasty; Abdominal hysterectomy; Cholecystectomy; Colonoscopy with esophagogastroduodenoscopy (egd); carpal tunnell; Eye surgery; Cataract extraction; Back surgery; Lumbar fusion; Upper esophageal endoscopic ultrasound (eus) (N/A, 01/03/2016); and Esophagogastroduodenoscopy (egd) with propofol (N/A, 02/16/2017). Family: family history includes Diabetes in her mother and sister.  Constitutional Exam  General appearance: Well nourished, well developed, and well hydrated. In no apparent acute distress Vitals:   11/29/18 0946  BP: 131/71  Pulse: 70  Resp: 18  Temp: 98.6 F (37 C)  TempSrc: Oral  SpO2: 100%  Weight: 210 lb (95.3 kg)  Height: 5' 4"  (1.626 m)   BMI Assessment: Estimated body mass index is 36.05 kg/m as calculated from the following:   Height as of this encounter: 5' 4"  (1.626 m).   Weight as of this encounter: 210 lb (95.3 kg).  BMI interpretation table: BMI level Category Range association with higher incidence of chronic pain  <18 kg/m2 Underweight   18.5-24.9 kg/m2 Ideal body weight   25-29.9 kg/m2 Overweight Increased incidence by 20%  30-34.9 kg/m2 Obese (Class I) Increased incidence by 68%  35-39.9 kg/m2 Severe obesity (Class II) Increased incidence by 136%  >40 kg/m2 Extreme obesity (Class III) Increased incidence by 254%   Patient's current BMI Ideal Body weight  Body mass index is 36.05 kg/m. Ideal body weight: 54.7 kg (120 lb 9.5 oz) Adjusted ideal body weight: 70.9 kg (156 lb 5.7 oz)   BMI Readings from Last 4 Encounters:  11/29/18 36.05 kg/m  11/29/18 36.05 kg/m  11/09/18 36.05 kg/m  05/24/18 36.06 kg/m   Wt Readings from Last 4 Encounters:  11/29/18 210 lb (95.3 kg)   11/29/18 210 lb (95.3 kg)  11/09/18 210 lb (95.3 kg)  05/24/18 210 lb 1.6 oz (95.3 kg)  Psych/Mental status: Alert, oriented x 3 (person, place, & time)       Eyes: PERLA Respiratory: No evidence of acute respiratory distress  Cervical Spine Area Exam  Skin & Axial Inspection: No masses, redness, edema, swelling, or associated skin lesions Alignment: Symmetrical Functional ROM: Unrestricted ROM      Stability: No instability detected Muscle Tone/Strength: Functionally intact. No obvious neuro-muscular anomalies detected. Sensory (Neurological): Unimpaired Palpation: No palpable anomalies              Upper Extremity (UE) Exam    Side: Right upper extremity  Side: Left upper extremity  Skin & Extremity Inspection: Skin color, temperature, and hair growth are WNL. No peripheral edema or cyanosis. No masses, redness, swelling, asymmetry, or associated skin lesions. No contractures.  Skin & Extremity Inspection: Skin color, temperature, and hair growth are WNL. No peripheral edema or cyanosis. No masses, redness, swelling, asymmetry, or associated skin lesions. No contractures.  Functional ROM: Unrestricted ROM          Functional ROM: Unrestricted ROM          Muscle Tone/Strength: Functionally intact. No obvious neuro-muscular anomalies detected.  Muscle Tone/Strength: Functionally intact. No obvious neuro-muscular anomalies detected.  Sensory (Neurological): Unimpaired          Sensory (Neurological): Unimpaired          Palpation: No palpable anomalies              Palpation: No palpable anomalies  Provocative Test(s):  Phalen's test: deferred Tinel's test: deferred Apley's scratch test (touch opposite shoulder):  Action 1 (Across chest): deferred Action 2 (Overhead): deferred Action 3 (LB reach): deferred   Provocative Test(s):  Phalen's test: deferred Tinel's test: deferred Apley's scratch test (touch opposite shoulder):  Action 1 (Across chest): deferred Action 2  (Overhead): deferred Action 3 (LB reach): deferred    Thoracic Spine Area Exam  Skin & Axial Inspection: No masses, redness, or swelling Alignment: Symmetrical Functional ROM: Unrestricted ROM Stability: No instability detected Muscle Tone/Strength: Functionally intact. No obvious neuro-muscular anomalies detected. Sensory (Neurological): Unimpaired Muscle strength & Tone: No palpable anomalies  Lumbar Spine Area Exam  Skin & Axial Inspection: No masses, redness, or swelling Alignment: Symmetrical Functional ROM: Decreased ROM affecting both sides Stability: No instability detected Muscle Tone/Strength: Guarding detected Sensory (Neurological): Movement-associated pain Palpation: Complains of area being tender to palpation       Provocative Tests: Hyperextension/rotation test: (+) bilaterally for facet joint pain. Lumbar quadrant test (Kemp's test): (+) bilaterally for facet joint pain. Lateral bending test: deferred today       Patrick's Maneuver: deferred today                   FABER* test: deferred today                   S-I anterior distraction/compression test: deferred today         S-I lateral compression test: deferred today         S-I Thigh-thrust test: deferred today         S-I Gaenslen's test: deferred today         *(Flexion, ABduction and External Rotation)  Gait & Posture Assessment  Ambulation: Unassisted Gait: Relatively normal for age and body habitus Posture: WNL   Lower Extremity Exam    Side: Right lower extremity  Side: Left lower extremity  Stability: No instability observed          Stability: No instability observed          Skin & Extremity Inspection: Skin color, temperature, and hair growth are WNL. No peripheral edema or cyanosis. No masses, redness, swelling, asymmetry, or associated skin lesions. No contractures.  Skin & Extremity Inspection: Skin color, temperature, and hair growth are WNL. No peripheral edema or cyanosis. No masses,  redness, swelling, asymmetry, or associated skin lesions. No contractures.  Functional ROM: Unrestricted ROM                  Functional ROM: Unrestricted ROM                  Muscle Tone/Strength: Functionally intact. No obvious neuro-muscular anomalies detected.  Muscle Tone/Strength: Functionally intact. No obvious neuro-muscular anomalies detected.  Sensory (Neurological): Unimpaired        Sensory (Neurological): Unimpaired        DTR: Patellar: deferred today Achilles: deferred today Plantar: deferred today  DTR: Patellar: deferred today Achilles: deferred today Plantar: deferred today  Palpation: No palpable anomalies  Palpation: No palpable anomalies   Assessment & Plan  Primary Diagnosis & Pertinent Problem List: The primary encounter diagnosis was Lumbar facet syndrome (B) (L>R). Diagnoses of Chronic hip pain (Left), Osteoarthritis of hip (Left), Vitamin D insufficiency, and Vitamin B12 deficiency were also pertinent to this visit.  Visit Diagnosis: 1. Lumbar facet syndrome (B) (L>R)   2. Chronic hip pain (Left)   3. Osteoarthritis of hip (Left)   4. Vitamin  D insufficiency   5. Vitamin B12 deficiency    Problems updated and reviewed during this visit: No problems updated.  Plan of Care  Pharmacotherapy (Medications Ordered): No orders of the defined types were placed in this encounter.   Procedure Orders     LUMBAR FACET(MEDIAL BRANCH NERVE BLOCK) MBNB  Lab Orders     Vitamin B12     25-Hydroxyvitamin D Lcms D2+D3 Imaging Orders  No imaging studies ordered today   Referral Orders  No referral(s) requested today    Pharmacological management options:  Opioid Analgesics: We'll take over management today. See above orders Membrane stabilizer: We have discussed the possibility of optimizing this mode of therapy, if tolerated Muscle relaxant: We have discussed the possibility of a trial NSAID: We have discussed the possibility of a trial Other analgesic(s):  To be determined at a later time   Interventional management options: Planned, scheduled, and/or pending:    Diagnostic bilateral lumbar facet block #1 under fluoroscopic guidance and IV sedation   Considering:   Diagnostic left LESI  Diagnostic bilateral lumbar facet nerve block  Possible bilateral lumbar facet RFA  Diagnostic bilateral suprascapular nerve block  Possible bilateral suprascapular RFA    PRN Procedures:   None at this time   Provider-requested follow-up: Return for Procedure (w/ sedation): (B) L-FCT BLK #1.  Future Appointments  Date Time Provider [place]  12/02/2018  8:30 AM Milinda Pointer, MD [facility] None  01/25/2019 10:15 AM Edrick Kins, DPM [facility]  07/25/2019  1:30 PM [facility]    Primary Care Physician: Remi Haggard, FNP Location: [facility] Note by: Gaspar Cola, MD Date: 11/29/2018; Time: 11:10 AM

## 2018-11-26 NOTE — Patient Instructions (Addendum)
______________________________________________________________________________________________  Specialty Pain Scale  Introduction:  There are significant differences in how pain is reported. The word pain usually refers to physical pain, but it is also a common synonym of suffering. The medical community uses a scale from 0 (zero) to 10 (ten) to report pain level. Zero (0) is described as "no pain", while ten (10) is described as "the worse pain you can imagine". The problem with this scale is that physical pain is reported along with suffering. Suffering refers to mental pain, or more often yet it refers to any unpleasant feeling, emotion or aversion associated with the perception of harm or threat of harm. It is the psychological component of pain.  Pain Specialists prefer to separate the two components. The pain scale used by this practice is the Verbal Numerical Rating Scale (VNRS-11). This scale is for the physical pain only. DO NOT INCLUDE how your pain psychologically affects you. This scale is for adults 21 years of age and older. It has 11 (eleven) levels. The 1st level is 0/10. This means: "right now, I have no pain". In the context of pain management, it also means: "right now, my physical pain is under control with the current therapy".  General Information:  The scale should reflect your current level of pain. Unless you are specifically asked for the level of your worst pain, or your average pain. If you are asked for one of these two, then it should be understood that it is over the past 24 hours.  Levels 1 (one) through 5 (five) are described below, and can be treated as an outpatient. Ambulatory pain management facilities such as ours are more than adequate to treat these levels. Levels 6 (six) through 10 (ten) are also described below, however, these must be treated as a hospitalized patient. While levels 6 (six) and 7 (seven) may be evaluated at an urgent care facility, levels 8  (eight) through 10 (ten) constitute medical emergencies and as such, they belong in a hospital's emergency department. When having these levels (as described below), do not come to our office. Our facility is not equipped to manage these levels. Go directly to an urgent care facility or an emergency department to be evaluated.  Definitions:  Activities of Daily Living (ADL): Activities of daily living (ADL or ADLs) is a term used in healthcare to refer to people's daily self-care activities. Health professionals often use a person's ability or inability to perform ADLs as a measurement of their functional status, particularly in regard to people post injury, with disabilities and the elderly. There are two ADL levels: Basic and Instrumental. Basic Activities of Daily Living (BADL  or BADLs) consist of self-care tasks that include: Bathing and showering; personal hygiene and grooming (including brushing/combing/styling hair); dressing; Toilet hygiene (getting to the toilet, cleaning oneself, and getting back up); eating and self-feeding (not including cooking or chewing and swallowing); functional mobility, often referred to as "transferring", as measured by the ability to walk, get in and out of bed, and get into and out of a chair; the broader definition (moving from one place to another while performing activities) is useful for people with different physical abilities who are still able to get around independently. Basic ADLs include the things many people do when they get up in the morning and get ready to go out of the house: get out of bed, go to the toilet, bathe, dress, groom, and eat. On the average, loss of function typically follows a particular order.   Hygiene is the first to go, followed by loss of toilet use and locomotion. The last to go is the ability to eat. When there is only one remaining area in which the person is independent, there is a 62.9% chance that it is eating and only a 3.5% chance  that it is hygiene. Instrumental Activities of Daily Living (IADL or IADLs) are not necessary for fundamental functioning, but they let an individual live independently in a community. IADL consist of tasks that include: cleaning and maintaining the house; home establishment and maintenance; care of others (including selecting and supervising caregivers); care of pets; child rearing; managing money; managing financials (investments, etc.); meal preparation and cleanup; shopping for groceries and necessities; moving within the community; safety procedures and emergency responses; health management and maintenance (taking prescribed medications); and using the telephone or other form of communication.  Instructions:  Most patients tend to report their pain as a combination of two factors, their physical pain and their psychosocial pain. This last one is also known as "suffering" and it is reflection of how physical pain affects you socially and psychologically. From now on, report them separately.  From this point on, when asked to report your pain level, report only your physical pain. Use the following table for reference.  Pain Clinic Pain Levels (0-5/10)  Pain Level Score  Description  No Pain 0   Mild pain 1 Nagging, annoying, but does not interfere with basic activities of daily living (ADL). Patients are able to eat, bathe, get dressed, toileting (being able to get on and off the toilet and perform personal hygiene functions), transfer (move in and out of bed or a chair without assistance), and maintain continence (able to control bladder and bowel functions). Blood pressure and heart rate are unaffected. A normal heart rate for a healthy adult ranges from 60 to 100 bpm (beats per minute).   Mild to moderate pain 2 Noticeable and distracting. Impossible to hide from other people. More frequent flare-ups. Still possible to adapt and function close to normal. It can be very annoying and may have  occasional stronger flare-ups. With discipline, patients may get used to it and adapt.   Moderate pain 3 Interferes significantly with activities of daily living (ADL). It becomes difficult to feed, bathe, get dressed, get on and off the toilet or to perform personal hygiene functions. Difficult to get in and out of bed or a chair without assistance. Very distracting. With effort, it can be ignored when deeply involved in activities.   Moderately severe pain 4 Impossible to ignore for more than a few minutes. With effort, patients may still be able to manage work or participate in some social activities. Very difficult to concentrate. Signs of autonomic nervous system discharge are evident: dilated pupils (mydriasis); mild sweating (diaphoresis); sleep interference. Heart rate becomes elevated (>115 bpm). Diastolic blood pressure (lower number) rises above 100 mmHg. Patients find relief in laying down and not moving.   Severe pain 5 Intense and extremely unpleasant. Associated with frowning face and frequent crying. Pain overwhelms the senses.  Ability to do any activity or maintain social relationships becomes significantly limited. Conversation becomes difficult. Pacing back and forth is common, as getting into a comfortable position is nearly impossible. Pain wakes you up from deep sleep. Physical signs will be obvious: pupillary dilation; increased sweating; goosebumps; brisk reflexes; cold, clammy hands and feet; nausea, vomiting or dry heaves; loss of appetite; significant sleep disturbance with inability to fall asleep or to   remain asleep. When persistent, significant weight loss is observed due to the complete loss of appetite and sleep deprivation.  Blood pressure and heart rate becomes significantly elevated. Caution: If elevated blood pressure triggers a pounding headache associated with blurred vision, then the patient should immediately seek attention at an urgent or emergency care unit, as  these may be signs of an impending stroke.    Emergency Department Pain Levels (6-10/10)  Emergency Room Pain 6 Severely limiting. Requires emergency care and should not be seen or managed at an outpatient pain management facility. Communication becomes difficult and requires great effort. Assistance to reach the emergency department may be required. Facial flushing and profuse sweating along with potentially dangerous increases in heart rate and blood pressure will be evident.   Distressing pain 7 Self-care is very difficult. Assistance is required to transport, or use restroom. Assistance to reach the emergency department will be required. Tasks requiring coordination, such as bathing and getting dressed become very difficult.   Disabling pain 8 Self-care is no longer possible. At this level, pain is disabling. The individual is unable to do even the most "basic" activities such as walking, eating, bathing, dressing, transferring to a bed, or toileting. Fine motor skills are lost. It is difficult to think clearly.   Incapacitating pain 9 Pain becomes incapacitating. Thought processing is no longer possible. Difficult to remember your own name. Control of movement and coordination are lost.   The worst pain imaginable 10 At this level, most patients pass out from pain. When this level is reached, collapse of the autonomic nervous system occurs, leading to a sudden drop in blood pressure and heart rate. This in turn results in a temporary and dramatic drop in blood flow to the brain, leading to a loss of consciousness. Fainting is one of the body's self defense mechanisms. Passing out puts the brain in a calmed state and causes it to shut down for a while, in order to begin the healing process.    Summary: 1. Refer to this scale when providing [place] with your pain level. 2. Be accurate and careful when reporting your pain level. This will help with your care. 3. Over-reporting your pain level will  lead to loss of credibility. 4. Even a level of 1/10 means that there is pain and will be treated at our facility. 5. High, inaccurate reporting will be documented as "Symptom Exaggeration", leading to loss of credibility and suspicions of possible secondary gains such as obtaining more narcotics, or wanting to appear disabled, for fraudulent reasons. 6. Only pain levels of 5 or below will be seen at our facility. 7. Pain levels of 6 and above will be sent to the Emergency Department and the appointment cancelled. ______________________________________________________________________________________________   ____________________________________________________________________________________________  Muscle Spasms & Cramps  Cause:  The most common cause of muscle spasms and cramps is vitamin and/or electrolyte (calcium, potassium, sodium, etc.) deficiencies.  Possible triggers: Sweating - causes loss of electrolytes thru the skin. Steroids - causes loss of electrolytes thru the urine.  Treatment: 8. Gatorade (or any other electrolyte-replenishing drink) - Take 1, 8 oz glass with each meal (3 times a day). 9. OTC (over-the-counter) Magnesium 400 to 500 mg - Take 1 tablet twice a day (one with breakfast and one before bedtime). If you have kidney problems, talk to your primary care physician before taking any Magnesium. 10. Tonic Water with quinine - Take 1, 8 oz glass before bedtime.   ____________________________________________________________________________________________   ____________________________________________________________________________________________  Preparing  for Procedure with Sedation  Instructions: . Oral Intake: Do not eat or drink anything for at least 8 hours prior to your procedure. . Transportation: Public transportation is not allowed. Bring an adult driver. The driver must be physically present in our waiting room before any procedure can be  started. Marland Kitchen Physical Assistance: Bring an adult physically capable of assisting you, in the event you need help. This adult should keep you company at home for at least 6 hours after the procedure. . Blood Pressure Medicine: Take your blood pressure medicine with a sip of water the morning of the procedure. . Blood thinners: Notify our staff if you are taking any blood thinners. Depending on which one you take, there will be specific instructions on how and when to stop it. . Diabetics on insulin: Notify the staff so that you can be scheduled 1st case in the morning. If your diabetes requires high dose insulin, take only  of your normal insulin dose the morning of the procedure and notify the staff that you have done so. . Preventing infections: Shower with an antibacterial soap the morning of your procedure. . Build-up your immune system: Take 1000 mg of Vitamin C with every meal (3 times a day) the day prior to your procedure. Marland Kitchen Antibiotics: Inform the staff if you have a condition or reason that requires you to take antibiotics before dental procedures. . Pregnancy: If you are pregnant, call and cancel the procedure. . Sickness: If you have a cold, fever, or any active infections, call and cancel the procedure. . Arrival: You must be in the facility at least 30 minutes prior to your scheduled procedure. . Children: Do not bring children with you. . Dress appropriately: Bring dark clothing that you would not mind if they get stained. . Valuables: Do not bring any jewelry or valuables.  Procedure appointments are reserved for interventional treatments only. Marland Kitchen No Prescription Refills. . No medication changes will be discussed during procedure appointments. . No disability issues will be discussed.  Reasons to call and reschedule or cancel your procedure: (Following these recommendations will minimize the risk of a serious complication.) . Surgeries: Avoid having procedures within 2 weeks of any  surgery. (Avoid for 2 weeks before or after any surgery). . Flu Shots: Avoid having procedures within 2 weeks of a flu shots or . (Avoid for 2 weeks before or after immunizations). . Barium: Avoid having a procedure within 7-10 days after having had a radiological study involving the use of radiological contrast. (Myelograms, Barium swallow or enema study). . Heart attacks: Avoid any elective procedures or surgeries for the initial 6 months after a "Myocardial Infarction" (Heart Attack). . Blood thinners: It is imperative that you stop these medications before procedures. Let us know if you if you take any blood thinner.  . Infection: Avoid procedures during or within two weeks of an infection (including chest colds or gastrointestinal problems). Symptoms associated with infections include: Localized redness, fever, chills, night sweats or profuse sweating, burning sensation when voiding, cough, congestion, stuffiness, runny nose, sore throat, diarrhea, nausea, vomiting, cold or Flu symptoms, recent or current infections. It is specially important if the infection is over the area that we intend to treat. Marland Kitchen Heart and lung problems: Symptoms that may suggest an active cardiopulmonary problem include: cough, chest pain, breathing difficulties or shortness of breath, dizziness, ankle swelling, uncontrolled high or unusually low blood pressure, and/or palpitations. If you are experiencing any of these symptoms, cancel your procedure  and contact your primary care physician for an evaluation.  Remember:  Regular Business hours are:  Monday to Thursday 8:00 AM to 4:00 PM  Provider's Schedule: Milinda Pointer, MD:  Procedure days: Tuesday and Thursday 7:30 AM to 4:00 PM  Gillis Santa, MD:  Procedure days: Monday and Wednesday 7:30 AM to 4:00 PM ____________________________________________________________________________________________   Pain Management Discharge Instructions  General Discharge  Instructions :  If you need to reach your doctor call: Monday-Friday 8:00 am - 4:00 pm at [phone] or toll free [phone].  After clinic hours [phone] to have operator reach doctor.  Bring all of your medication bottles to all your appointments in the pain clinic.  To cancel or reschedule your appointment with Pain Management please remember to call 24 hours in advance to avoid a fee.  Refer to the educational materials which you have been given on: General Risks, I had my Procedure. Discharge Instructions, Post Sedation.  Post Procedure Instructions:  The drugs you were given will stay in your system until tomorrow, so for the next 24 hours you should not drive, make any legal decisions or drink any alcoholic beverages.  You may eat anything you prefer, but it is better to start with liquids then soups and crackers, and gradually work up to solid foods.  Please notify your doctor immediately if you have any unusual bleeding, trouble breathing or pain that is not related to your normal pain.  Depending on the type of procedure that was done, some parts of your body may feel week and/or numb.  This usually clears up by tonight or the next day.  Walk with the use of an assistive device or accompanied by an adult for the 24 hours.  You may use ice on the affected area for the first 24 hours.  Put ice in a Ziploc bag and cover with a towel and place against area 15 minutes on 15 minutes off.  You may switch to heat after 24 hours.GENERAL RISKS AND COMPLICATIONS  What are the risk, side effects and possible complications? Generally speaking, most procedures are safe.  However, with any procedure there are risks, side effects, and the possibility of complications.  The risks and complications are dependent upon the sites that are lesioned, or the type of nerve block to be performed.  The closer the procedure is to the spine, the more serious the risks are.  Great care is taken when  placing the radio frequency needles, block needles or lesioning probes, but sometimes complications can occur. 1. Infection: Any time there is an injection through the skin, there is a risk of infection.  This is why sterile conditions are used for these blocks.  There are four possible types of infection. 1. Localized skin infection. 2. Central Nervous System Infection-This can be in the form of Meningitis, which can be deadly. 3. Epidural Infections-This can be in the form of an epidural abscess, which can cause pressure inside of the spine, causing compression of the spinal cord with subsequent paralysis. This would require an emergency surgery to decompress, and there are no guarantees that the patient would recover from the paralysis. 4. Discitis-This is an infection of the intervertebral discs.  It occurs in about 1% of discography procedures.  It is difficult to treat and it may lead to surgery.        2. Pain: the needles have to go through skin and soft tissues, will cause soreness.       3. Damage to  internal structures:  The nerves to be lesioned may be near blood vessels or    other nerves which can be potentially damaged.       4. Bleeding: Bleeding is more common if the patient is taking blood thinners such as  aspirin, Coumadin, Ticiid, Plavix, etc., or if he/she have some genetic predisposition  such as hemophilia. Bleeding into the spinal canal can cause compression of the spinal  cord with subsequent paralysis.  This would require an emergency surgery to  decompress and there are no guarantees that the patient would recover from the  paralysis.       5. Pneumothorax:  Puncturing of a lung is a possibility, every time a needle is introduced in  the area of the chest or upper back.  Pneumothorax refers to free air around the  collapsed lung(s), inside of the thoracic cavity (chest cavity).  Another two possible  complications related to a similar event would include: Hemothorax and  Chylothorax.   These are variations of the Pneumothorax, where instead of air around the collapsed  lung(s), you may have blood or chyle, respectively.       6. Spinal headaches: They may occur with any procedures in the area of the spine.       7. Persistent CSF (Cerebro-Spinal Fluid) leakage: This is a rare problem, but may occur  with prolonged intrathecal or epidural catheters either due to the formation of a fistulous  track or a dural tear.       8. Nerve damage: By working so close to the spinal cord, there is always a possibility of  nerve damage, which could be as serious as a permanent spinal cord injury with  paralysis.       9. Death:  Although rare, severe deadly allergic reactions known as "Anaphylactic  reaction" can occur to any of the medications used.      10. Worsening of the symptoms:  We can always make thing worse.  What are the chances of something like this happening? Chances of any of this occuring are extremely low.  By statistics, you have more of a chance of getting killed in a motor vehicle accident: while driving to the hospital than any of the above occurring .  Nevertheless, you should be aware that they are possibilities.  In general, it is similar to taking a shower.  Everybody knows that you can slip, hit your head and get killed.  Does that mean that you should not shower again?  Nevertheless always keep in mind that statistics do not mean anything if you happen to be on the wrong side of them.  Even if a procedure has a 1 (one) in a 1,000,000 (million) chance of going wrong, it you happen to be that one..Also, keep in mind that by statistics, you have more of a chance of having something go wrong when taking medications.  Who should not have this procedure? If you are on a blood thinning medication (e.g. Coumadin, Plavix, see list of "Blood Thinners"), or if you have an active infection going on, you should not have the procedure.  If you are taking any blood thinners,  please inform your physician.  How should I prepare for this procedure?  Do not eat or drink anything at least six hours prior to the procedure.  Bring a driver with you .  It cannot be a taxi.  Come accompanied by an adult that can drive you back, and that is strong  enough to help you if your legs get weak or numb from the local anesthetic.  Take all of your medicines the morning of the procedure with just enough water to swallow them.  If you have diabetes, make sure that you are scheduled to have your procedure done first thing in the morning, whenever possible.  If you have diabetes, take only half of your insulin dose and notify our nurse that you have done so as soon as you arrive at the clinic.  If you are diabetic, but only take blood sugar pills (oral hypoglycemic), then do not take them on the morning of your procedure.  You may take them after you have had the procedure.  Do not take aspirin or any aspirin-containing medications, at least eleven (11) days prior to the procedure.  They may prolong bleeding.  Wear loose fitting clothing that may be easy to take off and that you would not mind if it got stained with Betadine or blood.  Do not wear any jewelry or perfume  Remove any nail coloring.  It will interfere with some of our monitoring equipment.  NOTE: Remember that this is not meant to be interpreted as a complete list of all possible complications.  Unforeseen problems may occur.  BLOOD THINNERS The following drugs contain aspirin or other products, which can cause increased bleeding during surgery and should not be taken for 2 weeks prior to and 1 week after surgery.  If you should need take something for relief of minor pain, you may take acetaminophen which is found in Tylenol,m Datril, Anacin-3 and Panadol. It is not blood thinner. The products listed below are.  Do not take any of the products listed below in addition to any listed on your instruction  sheet.  A.P.C or A.P.C with Codeine Codeine Phosphate Capsules #3 Ibuprofen Ridaura  ABC compound Congesprin Imuran rimadil  Advil Cope Indocin Robaxisal  Alka-Seltzer Effervescent Pain Reliever and Antacid Coricidin or Coricidin-D  Indomethacin Rufen  Alka-Seltzer plus Cold Medicine Cosprin Ketoprofen S-A-C Tablets  Anacin Analgesic Tablets or Capsules Coumadin Korlgesic Salflex  Anacin Extra Strength Analgesic tablets or capsules CP-2 Tablets Lanoril Salicylate  Anaprox Cuprimine Capsules Levenox Salocol  Anexsia-D Dalteparin Magan Salsalate  Anodynos Darvon compound Magnesium Salicylate Sine-off  Ansaid Dasin Capsules Magsal Sodium Salicylate  Anturane Depen Capsules Marnal Soma  APF Arthritis pain formula Dewitt's Pills Measurin [place]  Argesic Dia-Gesic Meclofenamic Sulfinpyrazone  Arthritis Bayer Timed Release Aspirin Diclofenac Meclomen Sulindac  Arthritis pain formula Anacin Dicumarol Medipren Supac  Analgesic (Safety coated) Arthralgen Diffunasal Mefanamic Suprofen  Arthritis Strength Bufferin Dihydrocodeine Mepro Compound Suprol  Arthropan liquid Dopirydamole Methcarbomol with Aspirin Synalgos  ASA tablets/Enseals Disalcid Micrainin Tagament  Ascriptin Doan's Midol Talwin  Ascriptin A/D Dolene Mobidin Tanderil  Ascriptin Extra Strength Dolobid Moblgesic Ticlid  Ascriptin with Codeine Doloprin or Doloprin with Codeine Momentum Tolectin  Asperbuf Duoprin Mono-gesic Trendar  Aspergum Duradyne Motrin or Motrin IB Triminicin  Aspirin plain, buffered or enteric coated Durasal Myochrisine Trigesic  Aspirin Suppositories Easprin Nalfon Trillsate  Aspirin with Codeine Ecotrin Regular or Extra Strength Naprosyn Uracel  Atromid-S Efficin Naproxen Ursinus  Auranofin Capsules Elmiron Neocylate Vanquish  Axotal Emagrin Norgesic Verin  Azathioprine Empirin or Empirin with Codeine Normiflo Vitamin E  Azolid Emprazil Nuprin Voltaren  Bayer Aspirin plain, buffered or children's or  timed BC Tablets or powders Encaprin Orgaran Warfarin Sodium  Buff-a-Comp Enoxaparin Orudis Zorpin  Buff-a-Comp with Codeine Equegesic Os-Cal-Gesic   Buffaprin Excedrin plain, buffered or Extra Strength  Oxalid   Bufferin Arthritis Strength Feldene Oxphenbutazone   Bufferin plain or Extra Strength Feldene Capsules Oxycodone with Aspirin   Bufferin with Codeine Fenoprofen Fenoprofen Pabalate or Pabalate-SF   Buffets II Flogesic Panagesic   Buffinol plain or Extra Strength Florinal or Florinal with Codeine Panwarfarin   Buf-Tabs Flurbiprofen Penicillamine   Butalbital Compound Four-way cold tablets Penicillin   Butazolidin Fragmin Pepto-Bismol   Carbenicillin Geminisyn Percodan   Carna Arthritis Reliever Geopen Persantine   Carprofen Gold's salt Persistin   Chloramphenicol Goody's Phenylbutazone   Chloromycetin Haltrain Piroxlcam   Clmetidine heparin Plaquenil   Cllnoril Hyco-pap Ponstel   Clofibrate Hydroxy chloroquine Propoxyphen         Before stopping any of these medications, be sure to consult the physician who ordered them.  Some, such as Coumadin (Warfarin) are ordered to prevent or treat serious conditions such as "deep thrombosis", "pumonary embolisms", and other heart problems.  The amount of time that you may need off of the medication may also vary with the medication and the reason for which you were taking it.  If you are taking any of these medications, please make sure you notify your pain physician before you undergo any procedures.         Facet Blocks Patient Information  Description: The facets are joints in the spine between the vertebrae.  Like any joints in the body, facets can become irritated and painful.  Arthritis can also effect the facets.  By injecting steroids and local anesthetic in and around these joints, we can temporarily block the nerve supply to them.  Steroids act directly on irritated nerves and tissues to reduce selling and inflammation which  often leads to decreased pain.  Facet blocks may be done anywhere along the spine from the neck to the low back depending upon the location of your pain.   After numbing the skin with local anesthetic (like Novocaine), a small needle is passed onto the facet joints under x-ray guidance.  You may experience a sensation of pressure while this is being done.  The entire block usually lasts about 15-25 minutes.   Conditions which may be treated by facet blocks:   Low back/buttock pain  Neck/shoulder pain  Certain types of headaches  Preparation for the injection:  1. Do not eat any solid food or dairy products within 8 hours of your appointment. 2. You may drink clear liquid up to 3 hours before appointment.  Clear liquids include water, black coffee, juice or soda.  No milk or cream please. 3. You may take your regular medication, including pain medications, with a sip of water before your appointment.  Diabetics should hold regular insulin (if taken separately) and take 1/2 normal NPH dose the morning of the procedure.  Carry some sugar containing items with you to your appointment. 4. A driver must accompany you and be prepared to drive you home after your procedure. 5. Bring all your current medications with you. 6. An IV may be inserted and sedation may be given at the discretion of the physician. 7. A blood pressure cuff, EKG and other monitors will often be applied during the procedure.  Some patients may need to have extra oxygen administered for a short period. 8. You will be asked to provide medical information, including your allergies and medications, prior to the procedure.  We must know immediately if you are taking blood thinners (like Coumadin/Warfarin) or if you are allergic to IV iodine contrast (dye).  We must  know if you could possible be pregnant.  Possible side-effects:   Bleeding from needle site  Infection (rare, may require surgery)  Nerve injury (rare)  Numbness  & tingling (temporary)  Difficulty urinating (rare, temporary)  Spinal headache (a headache worse with upright posture)  Light-headedness (temporary)  Pain at injection site (serveral days)  Decreased blood pressure (rare, temporary)  Weakness in arm/leg (temporary)  Pressure sensation in back/neck (temporary)   Call if you experience:   Fever/chills associated with headache or increased back/neck pain  Headache worsened by an upright position  New onset, weakness or numbness of an extremity below the injection site  Hives or difficulty breathing (go to the emergency room)  Inflammation or drainage at the injection site(s)  Severe back/neck pain greater than usual  New symptoms which are concerning to you  Please note:  Although the local anesthetic injected can often make your back or neck feel good for several hours after the injection, the pain will likely return. It takes 3-7 days for steroids to work.  You may not notice any pain relief for at least one week.  If effective, we will often do a series of 2-3 injections spaced 3-6 weeks apart to maximally decrease your pain.  After the initial series, you may be a candidate for a more permanent nerve block of the facets.  If you have any questions, please call #[phone]) [facility]

## 2018-11-29 ENCOUNTER — Encounter: Payer: Self-pay | Admitting: Pain Medicine

## 2018-11-29 ENCOUNTER — Other Ambulatory Visit: Payer: Self-pay

## 2018-11-29 ENCOUNTER — Ambulatory Visit (HOSPITAL_BASED_OUTPATIENT_CLINIC_OR_DEPARTMENT_OTHER): Payer: Medicare Other | Admitting: Pain Medicine

## 2018-11-29 ENCOUNTER — Ambulatory Visit
Admission: RE | Admit: 2018-11-29 | Discharge: 2018-11-29 | Disposition: A | Discharge status: Home / Self Care | Payer: Medicare Other | Source: Ambulatory Visit | Attending: Nurse Practitioner | Admitting: Nurse Practitioner

## 2018-11-29 VITALS — BP 131/71 | HR 70 | Temp 98.6°F | Resp 18 | Ht 64.0 in | Wt 210.0 lb

## 2018-11-29 DIAGNOSIS — M25552 Pain in left hip: Secondary | ICD-10-CM | POA: Diagnosis not present

## 2018-11-29 DIAGNOSIS — M47816 Spondylosis without myelopathy or radiculopathy, lumbar region: Secondary | ICD-10-CM | POA: Insufficient documentation

## 2018-11-29 DIAGNOSIS — M1612 Unilateral primary osteoarthritis, left hip: Secondary | ICD-10-CM | POA: Diagnosis not present

## 2018-11-29 DIAGNOSIS — E559 Vitamin D deficiency, unspecified: Secondary | ICD-10-CM | POA: Diagnosis not present

## 2018-11-29 DIAGNOSIS — Z87891 Personal history of nicotine dependence: Secondary | ICD-10-CM | POA: Insufficient documentation

## 2018-11-29 DIAGNOSIS — E538 Deficiency of other specified B group vitamins: Secondary | ICD-10-CM

## 2018-11-29 DIAGNOSIS — Z122 Encounter for screening for malignant neoplasm of respiratory organs: Secondary | ICD-10-CM | POA: Insufficient documentation

## 2018-11-29 DIAGNOSIS — G8929 Other chronic pain: Secondary | ICD-10-CM | POA: Insufficient documentation

## 2018-11-29 NOTE — Progress Notes (Signed)
Safety precautions to be maintained throughout the outpatient stay will include: orient to surroundings, keep bed in low position, maintain call bell within reach at all times, provide assistance with transfer out of bed and ambulation.  

## 2018-12-01 ENCOUNTER — Encounter: Payer: Self-pay | Admitting: *Deleted

## 2018-12-02 ENCOUNTER — Encounter: Payer: Self-pay | Admitting: Pain Medicine

## 2018-12-02 ENCOUNTER — Other Ambulatory Visit: Payer: Self-pay

## 2018-12-02 ENCOUNTER — Ambulatory Visit (HOSPITAL_BASED_OUTPATIENT_CLINIC_OR_DEPARTMENT_OTHER): Payer: Medicare Other | Admitting: Pain Medicine

## 2018-12-02 ENCOUNTER — Ambulatory Visit
Admission: RE | Admit: 2018-12-02 | Discharge: 2018-12-02 | Disposition: A | Discharge status: Home / Self Care | Payer: Medicare Other | Source: Ambulatory Visit | Attending: Pain Medicine | Admitting: Pain Medicine

## 2018-12-02 VITALS — BP 121/69 | HR 93 | Temp 97.9°F | Resp 17 | Ht 64.0 in | Wt 210.0 lb

## 2018-12-02 DIAGNOSIS — M545 Low back pain, unspecified: Secondary | ICD-10-CM | POA: Insufficient documentation

## 2018-12-02 DIAGNOSIS — M48062 Spinal stenosis, lumbar region with neurogenic claudication: Secondary | ICD-10-CM | POA: Insufficient documentation

## 2018-12-02 DIAGNOSIS — G8929 Other chronic pain: Secondary | ICD-10-CM | POA: Diagnosis present

## 2018-12-02 DIAGNOSIS — M47816 Spondylosis without myelopathy or radiculopathy, lumbar region: Secondary | ICD-10-CM | POA: Diagnosis present

## 2018-12-02 DIAGNOSIS — M961 Postlaminectomy syndrome, not elsewhere classified: Secondary | ICD-10-CM | POA: Insufficient documentation

## 2018-12-02 DIAGNOSIS — R937 Abnormal findings on diagnostic imaging of other parts of musculoskeletal system: Secondary | ICD-10-CM | POA: Insufficient documentation

## 2018-12-02 DIAGNOSIS — M47817 Spondylosis without myelopathy or radiculopathy, lumbosacral region: Secondary | ICD-10-CM | POA: Diagnosis present

## 2018-12-02 DIAGNOSIS — M5136 Other intervertebral disc degeneration, lumbar region: Secondary | ICD-10-CM | POA: Insufficient documentation

## 2018-12-02 MED ORDER — MIDAZOLAM HCL 5 MG/5ML IJ SOLN
1.0000 mg | INTRAMUSCULAR | Status: DC | PRN
  Administered 2018-12-02: 2 mg via INTRAVENOUS
  Filled 2018-12-02: qty 5

## 2018-12-02 MED ORDER — FENTANYL CITRATE (PF) 100 MCG/2ML IJ SOLN
25.0000 ug | INTRAMUSCULAR | Status: DC | PRN
  Administered 2018-12-02: 50 ug via INTRAVENOUS
  Filled 2018-12-02: qty 2

## 2018-12-02 MED ORDER — ROPIVACAINE HCL 2 MG/ML IJ SOLN
18.0000 mL | Freq: Once | INTRAMUSCULAR | Status: AC
  Administered 2018-12-02: 18 mL via PERINEURAL
  Filled 2018-12-02: qty 20

## 2018-12-02 MED ORDER — LIDOCAINE HCL 2 % IJ SOLN
20.0000 mL | Freq: Once | INTRAMUSCULAR | Status: AC
  Administered 2018-12-02: 400 mg
  Filled 2018-12-02: qty 40

## 2018-12-02 MED ORDER — TRIAMCINOLONE ACETONIDE 40 MG/ML IJ SUSP
80.0000 mg | Freq: Once | INTRAMUSCULAR | Status: AC
  Administered 2018-12-02: 80 mg
  Filled 2018-12-02: qty 2

## 2018-12-02 MED ORDER — LACTATED RINGERS IV SOLN
1000.0000 mL | Freq: Once | INTRAVENOUS | Status: AC
  Administered 2018-12-02: 1000 mL via INTRAVENOUS

## 2018-12-02 NOTE — Progress Notes (Signed)
Safety precautions to be maintained throughout the outpatient stay will include: orient to surroundings, keep bed in low position, maintain call bell within reach at all times, provide assistance with transfer out of bed and ambulation.  

## 2018-12-02 NOTE — Progress Notes (Signed)
Patient's Name: Lynn Potter  MRN: [id number]  Referring Provider: Remi Haggard, FNP  DOB: May 13, 1946  PCP: Remi Haggard, FNP  DOS: 12/02/2018  Note by: Gaspar Cola, MD  Service setting: Ambulatory outpatient  Specialty: Interventional Pain Management  Patient type: Established  Location: [facility] (AMB) Pain Management Facility  Visit type: Interventional Procedure   Primary Reason for Visit: Interventional Pain Management Treatment. CC: Back Pain  Procedure:          Anesthesia, Analgesia, Anxiolysis:  Type: Lumbar Facet, Medial Branch Block(s) #1  Primary Purpose: Diagnostic Region: Posterolateral Lumbosacral Spine Level: L2, L3, L4, L5, & S1 Medial Branch Level(s). Injecting these levels blocks the L3-4, L4-5, and L5-S1 lumbar facet joints. Laterality: Bilateral  Type: Moderate (Conscious) Sedation combined with Local Anesthesia Indication(s): Analgesia and Anxiety Route: Intravenous (IV) IV Access: Secured Sedation: Meaningful verbal contact was maintained at all times during the procedure  Local Anesthetic: Lidocaine 1-2%  Position: Prone   Indications: 1. Spondylosis without myelopathy or radiculopathy, lumbosacral region   2. Lumbar facet syndrome (Bilateral) (L>R)   3. Lumbar facet hypertrophy (Multilevel)   4. DDD (degenerative disc disease), lumbar   5. Chronic low back pain (Primary area of Pain) (Bilateral) (L>R) w/o sciatica   6. Osteoarthritis of facet joint of lumbar spine   7. Lumbar spondylosis    Pain Score: Pre-procedure: 5 /10 Post-procedure: 0-No pain/10  Pre-op Assessment:  Lynn Potter is a 72 y.o. (year old), female patient, seen today for interventional treatment. She  has a past surgical history that includes Joint replacement; Total knee arthroplasty; Abdominal hysterectomy; Cholecystectomy; Colonoscopy with esophagogastroduodenoscopy (egd); carpal tunnell; Eye surgery; Cataract extraction; Back surgery; Lumbar fusion; Upper esophageal  endoscopic ultrasound (eus) (N/A, 01/03/2016); and Esophagogastroduodenoscopy (egd) with propofol (N/A, 02/16/2017). Lynn Potter has a current medication list which includes the following prescription(s): accu-chek aviva plus, accu-chek softclix lancets, amlodipine, vitamin c, aspirin, azelastine, baclofen, accu-chek aviva plus, cholecalciferol, ferrous sulfate, fluconazole, hydroxyzine, insulin aspart, insulin lispro, jardiance, latanoprost, levemir flextouch, metformin, methocarbamol, olmesartan-hydrochlorothiazide, pantoprazole, atorvastatin, and pregabalin, and the following Facility-Administered Medications: fentanyl and midazolam. Her primarily concern today is the Back Pain  Initial Vital Signs:  Pulse/HCG Rate: 93ECG Heart Rate: 90 Temp: 98.3 F (36.8 C) Resp: 16 BP: 139/78 SpO2: 100 %  BMI: Estimated body mass index is 36.05 kg/m as calculated from the following:   Height as of this encounter: 5\' 4"  (1.626 m).   Weight as of this encounter: 210 lb (95.3 kg).  Risk Assessment: Allergies: Reviewed. She has No Known Allergies.  Allergy Precautions: None required Coagulopathies: Reviewed. None identified.  Blood-thinner therapy: None at this time Active Infection(s): Reviewed. None identified. Lynn Potter is afebrile  Site Confirmation: Lynn Potter was asked to confirm the procedure and laterality before marking the site Procedure checklist: Completed Consent: Before the procedure and under the influence of no sedative(s), amnesic(s), or anxiolytics, the patient was informed of the treatment options, risks and possible complications. To fulfill our ethical and legal obligations, as recommended by the American Medical Association's Code of Ethics, I have informed the patient of my clinical impression; the nature and purpose of the treatment or procedure; the risks, benefits, and possible complications of the intervention; the alternatives, including doing nothing; the risk(s) and benefit(s) of  the alternative treatment(s) or procedure(s); and the risk(s) and benefit(s) of doing nothing. The patient was provided information about the general risks and possible complications associated with the procedure. These may include, but are not limited  to: failure to achieve desired goals, infection, bleeding, organ or nerve damage, allergic reactions, paralysis, and death. In addition, the patient was informed of those risks and complications associated to Spine-related procedures, such as failure to decrease pain; infection (i.e.: Meningitis, epidural or intraspinal abscess); bleeding (i.e.: epidural hematoma, subarachnoid hemorrhage, or any other type of intraspinal or peri-dural bleeding); organ or nerve damage (i.e.: Any type of peripheral nerve, nerve root, or spinal cord injury) with subsequent damage to sensory, motor, and/or autonomic systems, resulting in permanent pain, numbness, and/or weakness of one or several areas of the body; allergic reactions; (i.e.: anaphylactic reaction); and/or death. Furthermore, the patient was informed of those risks and complications associated with the medications. These include, but are not limited to: allergic reactions (i.e.: anaphylactic or anaphylactoid reaction(s)); adrenal axis suppression; blood sugar elevation that in diabetics may result in ketoacidosis or comma; water retention that in patients with history of congestive heart failure may result in shortness of breath, pulmonary edema, and decompensation with resultant heart failure; weight gain; swelling or edema; medication-induced neural toxicity; particulate matter embolism and blood vessel occlusion with resultant organ, and/or nervous system infarction; and/or aseptic necrosis of one or more joints. Finally, the patient was informed that Medicine is not an exact science; therefore, there is also the possibility of unforeseen or unpredictable risks and/or possible complications that may result in a  catastrophic outcome. The patient indicated having understood very clearly. We have given the patient no guarantees and we have made no promises. Enough time was given to the patient to ask questions, all of which were answered to the patient's satisfaction. Lynn Potter has indicated that she wanted to continue with the procedure. Attestation: I, the ordering provider, attest that I have discussed with the patient the benefits, risks, side-effects, alternatives, likelihood of achieving goals, and potential problems during recovery for the procedure that I have provided informed consent. Date  Time: 12/02/2018  8:35 AM  Pre-Procedure Preparation:  Monitoring: As per clinic protocol. Respiration, ETCO2, SpO2, BP, heart rate and rhythm monitor placed and checked for adequate function Safety Precautions: Patient was assessed for positional comfort and pressure points before starting the procedure. Time-out: I initiated and conducted the "Time-out" before starting the procedure, as per protocol. The patient was asked to participate by confirming the accuracy of the "Time Out" information. Verification of the correct person, site, and procedure were performed and confirmed by me, the nursing staff, and the patient. "Time-out" conducted as per Joint Commission's Universal Protocol (UP.01.01.01). Time: 1001  Description of Procedure:          Laterality: Bilateral. The procedure was performed in identical fashion on both sides. Levels:  L2, L3, L4, L5, & S1 Medial Branch Level(s) Area Prepped: Posterior Lumbosacral Region Prepping solution: ChloraPrep (2% chlorhexidine gluconate and 70% isopropyl alcohol) Safety Precautions: Aspiration looking for blood return was conducted prior to all injections. At no point did we inject any substances, as a needle was being advanced. Before injecting, the patient was told to immediately notify me if she was experiencing any new onset of "ringing in the ears, or metallic  taste in the mouth". No attempts were made at seeking any paresthesias. Safe injection practices and needle disposal techniques used. Medications properly checked for expiration dates. SDV (single dose vial) medications used. After the completion of the procedure, all disposable equipment used was discarded in the proper designated medical waste containers. Local Anesthesia: Protocol guidelines were followed. The patient was positioned over the fluoroscopy table.  The area was prepped in the usual manner. The time-out was completed. The target area was identified using fluoroscopy. A 12-in long, straight, sterile hemostat was used with fluoroscopic guidance to locate the targets for each level blocked. Once located, the skin was marked with an approved surgical skin marker. Once all sites were marked, the skin (epidermis, dermis, and hypodermis), as well as deeper tissues (fat, connective tissue and muscle) were infiltrated with a small amount of a short-acting local anesthetic, loaded on a 10cc syringe with a 25G, 1.5-in  Needle. An appropriate amount of time was allowed for local anesthetics to take effect before proceeding to the next step. Local Anesthetic: Lidocaine 2.0% The unused portion of the local anesthetic was discarded in the proper designated containers. Technical explanation of process:  L2 Medial Branch Nerve Block (MBB): The target area for the L2 medial branch is at the junction of the postero-lateral aspect of the superior articular process and the superior, posterior, and medial edge of the transverse process of L3. Under fluoroscopic guidance, a Quincke needle was inserted until contact was made with os over the superior postero-lateral aspect of the pedicular shadow (target area). After negative aspiration for blood, 0.5 mL of the nerve block solution was injected without difficulty or complication. The needle was removed intact. L3 Medial Branch Nerve Block (MBB): The target area for the  L3 medial branch is at the junction of the postero-lateral aspect of the superior articular process and the superior, posterior, and medial edge of the transverse process of L4. Under fluoroscopic guidance, a Quincke needle was inserted until contact was made with os over the superior postero-lateral aspect of the pedicular shadow (target area). After negative aspiration for blood, 0.5 mL of the nerve block solution was injected without difficulty or complication. The needle was removed intact. L4 Medial Branch Nerve Block (MBB): The target area for the L4 medial branch is at the junction of the postero-lateral aspect of the superior articular process and the superior, posterior, and medial edge of the transverse process of L5. Under fluoroscopic guidance, a Quincke needle was inserted until contact was made with os over the superior postero-lateral aspect of the pedicular shadow (target area). After negative aspiration for blood, 0.5 mL of the nerve block solution was injected without difficulty or complication. The needle was removed intact. L5 Medial Branch Nerve Block (MBB): The target area for the L5 medial branch is at the junction of the postero-lateral aspect of the superior articular process and the superior, posterior, and medial edge of the sacral ala. Under fluoroscopic guidance, a Quincke needle was inserted until contact was made with os over the superior postero-lateral aspect of the pedicular shadow (target area). After negative aspiration for blood, 0.5 mL of the nerve block solution was injected without difficulty or complication. The needle was removed intact. S1 Medial Branch Nerve Block (MBB): The target area for the S1 medial branch is at the posterior and inferior 6 o'clock position of the L5-S1 facet joint. Under fluoroscopic guidance, the Quincke needle inserted for the L5 MBB was redirected until contact was made with os over the inferior and postero aspect of the sacrum, at the 6 o'  clock position under the L5-S1 facet joint (Target area). After negative aspiration for blood, 0.5 mL of the nerve block solution was injected without difficulty or complication. The needle was removed intact.  Nerve block solution: 0.2% PF-Ropivacaine + Triamcinolone (40 mg/mL) diluted to a final concentration of 4 mg of Triamcinolone/mL of  Ropivacaine The unused portion of the solution was discarded in the proper designated containers. Procedural Needles: 22-gauge, 3.5-inch, Quincke needles used for all levels.  Once the entire procedure was completed, the treated area was cleaned, making sure to leave some of the prepping solution back to take advantage of its long term bactericidal properties.   Illustration of the posterior view of the lumbar spine and the posterior neural structures. Laminae of L2 through S1 are labeled. DPRL5, dorsal primary ramus of L5; DPRS1, dorsal primary ramus of S1; DPR3, dorsal primary ramus of L3; FJ, facet (zygapophyseal) joint L3-L4; I, inferior articular process of L4; LB1, lateral branch of dorsal primary ramus of L1; IAB, inferior articular branches from L3 medial branch (supplies L4-L5 facet joint); IBP, intermediate branch plexus; MB3, medial branch of dorsal primary ramus of L3; NR3, third lumbar nerve root; S, superior articular process of L5; SAB, superior articular branches from L4 (supplies L4-5 facet joint also); TP3, transverse process of L3.  Vitals:   12/02/18 1024 12/02/18 1032 12/02/18 1039 12/02/18 1042  BP: 115/71 (!) 73/50 110/78 121/69  Pulse:      Resp: 17 20 19 17   Temp:  97.9 F (36.6 C)  97.9 F (36.6 C)  TempSrc:  Temporal  Temporal  SpO2: 100% 100% 100% 100%  Weight:      Height:         Start Time: 1002 hrs. End Time: 1014 hrs.  Imaging Guidance (Spinal):          Type of Imaging Technique: Fluoroscopy Guidance (Spinal) Indication(s): Assistance in needle guidance and placement for procedures requiring needle placement in or  near specific anatomical locations not easily accessible without such assistance. Exposure Time: Please see nurses notes. Contrast: None used. Fluoroscopic Guidance: I was personally present during the use of fluoroscopy. "Tunnel Vision Technique" used to obtain the best possible view of the target area. Parallax error corrected before commencing the procedure. "Direction-depth-direction" technique used to introduce the needle under continuous pulsed fluoroscopy. Once target was reached, antero-posterior, oblique, and lateral fluoroscopic projection used confirm needle placement in all planes. Images permanently stored in EMR. Interpretation: No contrast injected. I personally interpreted the imaging intraoperatively. Adequate needle placement confirmed in multiple planes. Permanent images saved into the patient's record.  Antibiotic Prophylaxis:   Anti-infectives (From admission, onward)   None     Indication(s): None identified  Post-operative Assessment:  Post-procedure Vital Signs:  Pulse/HCG Rate: 9383 Temp: 97.9 F (36.6 C) Resp: 17 BP: 121/69 SpO2: 100 %  EBL: None  Complications: No immediate post-treatment complications observed by team, or reported by patient.  Note: The patient tolerated the entire procedure well. A repeat set of vitals were taken after the procedure and the patient was kept under observation following institutional policy, for this type of procedure. Post-procedural neurological assessment was performed, showing return to baseline, prior to discharge. The patient was provided with post-procedure discharge instructions, including a section on how to identify potential problems. Should any problems arise concerning this procedure, the patient was given instructions to immediately contact us, at any time, without hesitation. In any case, we plan to contact the patient by telephone for a follow-up status report regarding this interventional procedure.  Comments:   No additional relevant information.  Plan of Care    Imaging Orders     DG C-Arm 1-60 Min-No Report  Procedure Orders     LUMBAR FACET(MEDIAL BRANCH NERVE BLOCK) MBNB  Medications ordered for procedure: Meds ordered this encounter  Medications  . lidocaine (XYLOCAINE) 2 % (with pres) injection 400 mg  . midazolam (VERSED) 5 MG/5ML injection 1-2 mg    Make sure Flumazenil is available in the pyxis when using this medication. If oversedation occurs, administer 0.2 mg IV over 15 sec. If after 45 sec no response, administer 0.2 mg again over 1 min; may repeat at 1 min intervals; not to exceed 4 doses (1 mg)  . fentaNYL (SUBLIMAZE) injection 25-50 mcg    Make sure Narcan is available in the pyxis when using this medication. In the event of respiratory depression (RR< 8/min): Titrate NARCAN (naloxone) in increments of 0.1 to 0.2 mg IV at 2-3 minute intervals, until desired degree of reversal.  . lactated ringers infusion 1,000 mL  . ropivacaine (PF) 2 mg/mL (0.2%) (NAROPIN) injection 18 mL  . triamcinolone acetonide (KENALOG-40) injection 80 mg   Medications administered: We administered lidocaine, midazolam, fentaNYL, lactated ringers, ropivacaine (PF) 2 mg/mL (0.2%), and triamcinolone acetonide.  See the medical record for exact dosing, route, and time of administration.  Disposition: Discharge home  Discharge Date & Time: 12/02/2018; 1057 hrs.   Physician-requested Follow-up: Return for PPE (2 wks), w/ Dr. Dossie Arbour.  Future Appointments  Date Time Provider [place]  12/15/2018  1:30 PM Milinda Pointer, MD [facility] None  01/25/2019 10:15 AM Edrick Kins, DPM [facility]  07/25/2019  1:30 PM [facility]   Primary Care Physician: Remi Haggard, FNP Location: [facility] Note by: Gaspar Cola, MD Date: 12/02/2018; Time: 11:02 AM  Disclaimer:  Medicine is not an Chief Strategy Officer. The only guarantee in medicine is  that nothing is guaranteed. It is important to note that the decision to proceed with this intervention was based on the information collected from the patient. The Data and conclusions were drawn from the patient's questionnaire, the interview, and the physical examination. Because the information was provided in large part by the patient, it cannot be guaranteed that it has not been purposely or unconsciously manipulated. Every effort has been made to obtain as much relevant data as possible for this evaluation. It is important to note that the conclusions that lead to this procedure are derived in large part from the available data. Always take into account that the treatment will also be dependent on availability of resources and existing treatment guidelines, considered by other Pain Management Practitioners as being common knowledge and practice, at the time of the intervention. For Medico-Legal purposes, it is also important to point out that variation in procedural techniques and pharmacological choices are the acceptable norm. The indications, contraindications, technique, and results of the above procedure should only be interpreted and judged by a Board-Certified Interventional Pain Specialist with extensive familiarity and expertise in the same exact procedure and technique.

## 2018-12-02 NOTE — Patient Instructions (Signed)

## 2018-12-03 ENCOUNTER — Telehealth: Payer: Self-pay

## 2018-12-03 ENCOUNTER — Encounter: Payer: Self-pay | Admitting: *Deleted

## 2018-12-03 NOTE — Telephone Encounter (Signed)
Post procedure phone call.  LM 

## 2018-12-05 LAB — 25-HYDROXY VITAMIN D LCMS D2+D3
25-Hydroxy, Vitamin D-2: 2.5 ng/mL
25-Hydroxy, Vitamin D-3: 30 ng/mL
25-Hydroxy, Vitamin D: 33 ng/mL

## 2018-12-05 LAB — VITAMIN B12: Vitamin B-12: 297 pg/mL (ref 232–1245)

## 2018-12-12 NOTE — Progress Notes (Signed)
Patient's Name: Lynn Potter  MRN: [id number]  Referring Provider: Remi Haggard, FNP  DOB: 1946/05/25  PCP: Remi Haggard, FNP  DOS: 12/15/2018  Note by: Gaspar Cola, MD  Service setting: Ambulatory outpatient  Specialty: Interventional Pain Management  Location: [facility] (AMB) Pain Management Facility    Patient type: Established   Primary Reason(s) for Visit: Encounter for post-procedure evaluation of chronic illness with mild to moderate exacerbation CC: Back Pain  HPI  Lynn Potter is a 73 y.o. year old, female patient, who comes today for a post-procedure evaluation. She has Personal history of tobacco use, presenting hazards to health; Sepsis ([place]); Osteoarthritis of shoulder (Bilateral); Osteoarthritis of shoulder (Right); Rotator cuff tendinitis; History of rotator cuff surgery (Bilateral); Chronic pain syndrome; Encounter for long-term opiate analgesic use; Long term current use of opiate analgesic; Opiate use; Degenerative joint disease (DJD) of hip; Sacroiliac joint pain; Vitamin D insufficiency; Hyperglycemia; Acute diverticulitis; Chronic low back pain (Primary area of Pain) (Bilateral) (L>R) w/ sciatica (Left); Chronic lower extremity pain (Secondary Area of Pain) (Left); Chronic shoulder pain (Tertiary Area of Pain) (Bilateral) (R>L); Pharmacologic therapy; Disorder of skeletal system; Problems influencing health status; Elevated C-reactive protein (CRP); Elevated sed rate; Chronic hip pain (Left); Osteoarthritis of hip (Left); Lumbar facet syndrome (Bilateral) (L>R); Vitamin B12 deficiency; Spondylosis without myelopathy or radiculopathy, lumbosacral region; Chronic low back pain (Primary area of Pain) (Bilateral) (L>R) w/o sciatica; Abnormal CT of  lumbar spine (10/11/2018); Lumbar facet hypertrophy (Multilevel); DDD (degenerative disc disease), lumbar; Failed back surgical syndrome; 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 Her primarily concern today is the Back Pain  Pain Assessment: Location: Left, Lower Back Radiating: radiaties down left  buttock, hip , thigh , leg, down to foot Onset: More than a month ago Duration: Chronic pain Quality: Aching, Throbbing Severity: 5 /10 (subjective, self-reported pain score)  Note: Reported level is compatible with observation.                         When using our objective Pain Scale, levels between 6 and 10/10 are said to belong in an emergency room, as it progressively worsens from a 6/10, described as severely limiting, requiring emergency care not usually available at an outpatient pain management facility. At a 6/10 level, communication becomes difficult and requires great effort. Assistance to reach the emergency department may be required. Facial flushing and profuse sweating along with potentially dangerous increases in heart rate and blood pressure will be evident. Effect on ADL: limits my daily activities Modifying factors: Heat and produres BP: (!) 129/55  HR: 86  Lynn Potter comes in today for post-procedure evaluation.  Further details on both, my assessment(s), as well as the proposed treatment plan, please see below.  Post-Procedure Assessment  12/02/2018 Procedure: Diagnostic bilateral lumbar facet block #1 under fluoroscopic guidance and IV sedation Pre-procedure pain score:  5/10 Post-procedure pain score: 0/10 (100% relief) Influential Factors: BMI: 36.05 kg/m Intra-procedural challenges: None observed.         Assessment challenges: None detected.              Reported side-effects: None.        Post-procedural adverse reactions or complications: None reported         Sedation: Sedation provided. When no sedatives are used, the analgesic  levels obtained are directly associated to the effectiveness of the  local anesthetics. However, when sedation is provided, the level of analgesia obtained during the initial 1 hour following the intervention, is believed to be the result of a combination of factors. These factors may include, but are not limited to: 1. The effectiveness of the local anesthetics used. 2. The effects of the analgesic(s) and/or anxiolytic(s) used. 3. The degree of discomfort experienced by the patient at the time of the procedure. 4. The patients ability and reliability in recalling and recording the events. 5. The presence and influence of possible secondary gains and/or psychosocial factors. Reported result: Relief experienced during the 1st hour after the procedure: 100 % (Ultra-Short Term Relief)            Interpretative annotation: Clinically appropriate result. Analgesia during this period is likely to be Local Anesthetic and/or IV Sedative (Analgesic/Anxiolytic) related.          Effects of local anesthetic: The analgesic effects attained during this period are directly associated to the localized infiltration of local anesthetics and therefore cary significant diagnostic value as to the etiological location, or anatomical origin, of the pain. Expected duration of relief is directly dependent on the pharmacodynamics of the local anesthetic used. Long-acting (4-6 hours) anesthetics used.  Reported result: Relief during the next 4 to 6 hour after the procedure: 100 % (Short-Term Relief)            Interpretative annotation: Clinically appropriate result. Analgesia during this period is likely to be Local Anesthetic-related.          Long-term benefit: Defined as the period of time past the expected duration of local anesthetics (1 hour for short-acting and 4-6 hours for long-acting). With the possible exception of prolonged sympathetic blockade from the local anesthetics, benefits during this period are typically attributed to, or associated with, other factors such as analgesic  sensory neuropraxia, antiinflammatory effects, or beneficial biochemical changes provided by agents other than the local anesthetics.  Reported result: Extended relief following procedure: 100 %(last for 5 to 6 days) (Long-Term Relief)            Interpretative annotation: Clinically possible results. Good relief. No permanent benefit expected. Inflammation plays a part in the etiology to the pain.          Current benefits: Defined as reported results that persistent at this point in time.   Analgesia: <25 %            Function: Somewhat improved ROM: Somewhat improved Interpretative annotation: Recurrence of symptoms. No permanent benefit expected. Effective diagnostic intervention.          Interpretation: Results would suggest a successful diagnostic intervention.                  Plan:  Please see "Plan of Care" for details.                Laboratory Chemistry  Inflammation Markers (CRP: Acute Phase) (ESR: Chronic Phase) Lab Results  Component Value Date   CRP 16 (H) 11/09/2018   ESRSEDRATE 77 (H) 11/09/2018   LATICACIDVEN 1.9 09/30/2016                         Renal Markers Lab Results  Component Value Date   BUN 9 11/09/2018   CREATININE 0.86 11/09/2018   BCR 10 (L) 11/09/2018   GFRAA 77 11/09/2018   GFRNONAA 67 11/09/2018  Hepatic Markers Lab Results  Component Value Date   AST 11 11/09/2018   ALT 152 (H) 05/24/2018   ALBUMIN 3.9 11/09/2018                        Note: Lab results reviewed.  Recent Imaging Results   Results for orders placed in visit on 12/02/18  DG C-Arm 1-60 Min-No Report   Narrative Fluoroscopy was utilized by the requesting physician.  No radiographic  interpretation.    Interpretation Report: Fluoroscopy was used during the procedure to assist with needle guidance. The images were interpreted intraoperatively by the requesting physician.        Meds   Current Outpatient Medications:  .  ACCU-CHEK AVIVA  PLUS test strip, , Disp: , Rfl:  .  ACCU-CHEK SOFTCLIX LANCETS lancets, 2 (two) times daily., Disp: , Rfl:  .  amLODipine (NORVASC) 10 MG tablet, Take 10 mg by mouth daily., Disp: , Rfl: 5 .  Ascorbic Acid (VITAMIN C) 1000 MG tablet, Take 1,000 mg by mouth daily., Disp: , Rfl:  .  aspirin 81 MG tablet, Take 81 mg by mouth daily., Disp: , Rfl:  .  azelastine (ASTELIN) 0.1 % nasal spray, Place 1 spray into both nostrils 2 (two) times daily as needed for rhinitis or allergies. , Disp: , Rfl: 11 .  baclofen (LIORESAL) 10 MG tablet, 10 mg 2 (two) times daily. , Disp: , Rfl:  .  Blood Glucose Monitoring Suppl (ACCU-CHEK AVIVA PLUS) w/Device KIT, EVERY DAY, Disp: , Rfl:  .  Cholecalciferol (D-5000) 5000 units TABS, Take 5,000 Units by mouth daily. , Disp: , Rfl:  .  ferrous sulfate 325 (65 FE) MG EC tablet, Take 325 mg by mouth 2 (two) times daily. , Disp: , Rfl:  .  fluconazole (DIFLUCAN) 150 MG tablet, Take 150 mg by mouth daily., Disp: , Rfl: 0 .  hydrOXYzine (VISTARIL) 25 MG capsule, Take 25 mg by mouth every 12 (twelve) hours. , Disp: , Rfl: 0 .  insulin aspart (NOVOLOG) 100 UNIT/ML injection, Inject 0-9 Units into the skin 3 (three) times daily with meals., Disp: 10 mL, Rfl: 11 .  insulin lispro (HUMALOG KWIKPEN) 100 UNIT/ML KwikPen, Inject into the skin., Disp: , Rfl:  .  JARDIANCE 25 MG TABS tablet, Take 25 mg by mouth daily. , Disp: , Rfl:  .  latanoprost (XALATAN) 0.005 % ophthalmic solution, Place 1 drop into both eyes at bedtime. , Disp: , Rfl:  .  LEVEMIR FLEXTOUCH 100 UNIT/ML Pen, Inject 25 Units into the skin daily. Patient reports she only takes Levemir if her glucose is >150 mg/dl which is rare for her, Disp: , Rfl: 5 .  metFORMIN (GLUCOPHAGE) 500 MG tablet, Take 1 tablet (500 mg total) by mouth 2 (two) times daily with a meal., Disp: 60 tablet, Rfl: 0 .  methocarbamol (ROBAXIN) 500 MG tablet, Take 500 mg by mouth 3 (three) times daily., Disp: , Rfl:  .  olmesartan-hydrochlorothiazide  (BENICAR HCT) 40-25 MG tablet, Take 1 tablet by mouth daily., Disp: , Rfl: 5 .  pantoprazole (PROTONIX) 40 MG tablet, Take 1 tablet by mouth 2 (two) times daily. , Disp: , Rfl:  .  pregabalin (LYRICA) 150 MG capsule, Take 150 mg by mouth 2 (two) times daily. , Disp: , Rfl:  .  atorvastatin (LIPITOR) 40 MG tablet, Take 1 tablet (40 mg total) by mouth daily., Disp: 30 tablet, Rfl: 0  ROS  Constitutional: Denies any fever  or chills Gastrointestinal: No reported hemesis, hematochezia, vomiting, or acute GI distress Musculoskeletal: Denies any acute onset joint swelling, redness, loss of ROM, or weakness Neurological: No reported episodes of acute onset apraxia, aphasia, dysarthria, agnosia, amnesia, paralysis, loss of coordination, or loss of consciousness  Allergies  Lynn Potter has No Known Allergies.  [place]  Drug: Lynn Potter  reports no history of drug use. Alcohol:  reports no history of alcohol use. Tobacco:  reports that she has been smoking cigarettes. She has been smoking about 1.00 pack per day. She has never used smokeless tobacco. Medical:  has a past medical history of AF (atrial fibrillation) ([place]), Arthritis, Asthma, Chicken pox, Diabetes mellitus without complication ([place]), Glaucoma, Hemorrhoids, History of hiatal hernia, Hypertension, Migraines, Obesity, Opiate use (03/16/2017), Personal history of tobacco use, presenting hazards to health (06/18/2015), and Sleep apnea. Surgical: Lynn Potter  has a past surgical history that includes Joint replacement; Total knee arthroplasty; Abdominal hysterectomy; Cholecystectomy; Colonoscopy with esophagogastroduodenoscopy (egd); carpal tunnell; Eye surgery; Cataract extraction; Back surgery; Lumbar fusion; Upper esophageal endoscopic ultrasound (eus) (N/A, 01/03/2016); and Esophagogastroduodenoscopy (egd) with propofol (N/A, 02/16/2017). Family: family history includes Diabetes in her mother and sister.  Constitutional Exam  General appearance: Well  nourished, well developed, and well hydrated. In no apparent acute distress Vitals:   12/15/18 1351  BP: (!) 129/55  Pulse: 86  Temp: 98.3 F (36.8 C)  SpO2: 100%  Weight: 210 lb (95.3 kg)  Height: 5' 4"  (1.626 m)   BMI Assessment: Estimated body mass index is 36.05 kg/m as calculated from the following:   Height as of this encounter: 5' 4"  (1.626 m).   Weight as of this encounter: 210 lb (95.3 kg).  BMI interpretation table: BMI level Category Range association with higher incidence of chronic pain  <18 kg/m2 Underweight   18.5-24.9 kg/m2 Ideal body weight   25-29.9 kg/m2 Overweight Increased incidence by 20%  30-34.9 kg/m2 Obese (Class I) Increased incidence by 68%  35-39.9 kg/m2 Severe obesity (Class II) Increased incidence by 136%  >40 kg/m2 Extreme obesity (Class III) Increased incidence by 254%   Patient's current BMI Ideal Body weight  Body mass index is 36.05 kg/m. Ideal body weight: 54.7 kg (120 lb 9.5 oz) Adjusted ideal body weight: 70.9 kg (156 lb 5.7 oz)   BMI Readings from Last 4 Encounters:  12/15/18 36.05 kg/m  12/02/18 36.05 kg/m  11/29/18 36.05 kg/m  11/29/18 36.05 kg/m   Wt Readings from Last 4 Encounters:  12/15/18 210 lb (95.3 kg)  12/02/18 210 lb (95.3 kg)  11/29/18 210 lb (95.3 kg)  11/29/18 210 lb (95.3 kg)  Psych/Mental status: Alert, oriented x 3 (person, place, & time)       Eyes: PERLA Respiratory: No evidence of acute respiratory distress  Cervical Spine Area Exam  Skin & Axial Inspection: No masses, redness, edema, swelling, or associated skin lesions Alignment: Symmetrical Functional ROM: Unrestricted ROM      Stability: No instability detected Muscle Tone/Strength: Functionally intact. No obvious neuro-muscular anomalies detected. Sensory (Neurological): Unimpaired Palpation: No palpable anomalies              Upper Extremity (UE) Exam    Side: Right upper extremity  Side: Left upper extremity  Skin & Extremity Inspection:  Skin color, temperature, and hair growth are WNL. No peripheral edema or cyanosis. No masses, redness, swelling, asymmetry, or associated skin lesions. No contractures.  Skin & Extremity Inspection: Skin color, temperature, and hair growth are WNL. No peripheral edema  or cyanosis. No masses, redness, swelling, asymmetry, or associated skin lesions. No contractures.  Functional ROM: Unrestricted ROM          Functional ROM: Unrestricted ROM          Muscle Tone/Strength: Functionally intact. No obvious neuro-muscular anomalies detected.  Muscle Tone/Strength: Functionally intact. No obvious neuro-muscular anomalies detected.  Sensory (Neurological): Unimpaired          Sensory (Neurological): Unimpaired          Palpation: No palpable anomalies              Palpation: No palpable anomalies              Provocative Test(s):  Phalen's test: deferred Tinel's test: deferred Apley's scratch test (touch opposite shoulder):  Action 1 (Across chest): deferred Action 2 (Overhead): deferred Action 3 (LB reach): deferred   Provocative Test(s):  Phalen's test: deferred Tinel's test: deferred Apley's scratch test (touch opposite shoulder):  Action 1 (Across chest): deferred Action 2 (Overhead): deferred Action 3 (LB reach): deferred    Thoracic Spine Area Exam  Skin & Axial Inspection: No masses, redness, or swelling Alignment: Symmetrical Functional ROM: Unrestricted ROM Stability: No instability detected Muscle Tone/Strength: Functionally intact. No obvious neuro-muscular anomalies detected. Sensory (Neurological): Unimpaired Muscle strength & Tone: No palpable anomalies  Lumbar Spine Area Exam  Skin & Axial Inspection: No masses, redness, or swelling Alignment: Symmetrical Functional ROM: Decreased ROM       Stability: No instability detected Muscle Tone/Strength: Functionally intact. No obvious neuro-muscular anomalies detected. Sensory (Neurological): Unimpaired Palpation: No palpable  anomalies       Provocative Tests: Hyperextension/rotation test: deferred today       Lumbar quadrant test (Kemp's test): deferred today       Lateral bending test: deferred today       Patrick's Maneuver: deferred today                   FABER* test: deferred today                   S-I anterior distraction/compression test: deferred today         S-I lateral compression test: deferred today         S-I Thigh-thrust test: deferred today         S-I Gaenslen's test: deferred today         *(Flexion, ABduction and External Rotation)  Gait & Posture Assessment  Ambulation: Patient ambulates using a walker Gait: Significantly limited. Dependent on assistive device to ambulate Posture: Antalgic   Lower Extremity Exam    Side: Right lower extremity  Side: Left lower extremity  Stability: No instability observed          Stability: No instability observed          Skin & Extremity Inspection: Skin color, temperature, and hair growth are WNL. No peripheral edema or cyanosis. No masses, redness, swelling, asymmetry, or associated skin lesions. No contractures.  Skin & Extremity Inspection: Skin color, temperature, and hair growth are WNL. No peripheral edema or cyanosis. No masses, redness, swelling, asymmetry, or associated skin lesions. No contractures.  Functional ROM: Unrestricted ROM                  Functional ROM: Unrestricted ROM                  Muscle Tone/Strength: Functionally intact. No obvious neuro-muscular anomalies detected.  Muscle Tone/Strength: Functionally intact.  No obvious neuro-muscular anomalies detected.  Sensory (Neurological): Unimpaired        Sensory (Neurological): Unimpaired        DTR: Patellar: deferred today Achilles: deferred today Plantar: deferred today  DTR: Patellar: deferred today Achilles: deferred today Plantar: deferred today  Palpation: No palpable anomalies  Palpation: No palpable anomalies   Assessment   Status Diagnosis   Persistent Persistent Persistent 1. Chronic low back pain (Primary area of Pain) (Bilateral) (L>R) w/o sciatica   2. Chronic lower extremity pain (Secondary Area of Pain) (Left)   3. Chronic shoulder pain (Tertiary Area of Pain) (Bilateral) (R>L)   4. DDD (degenerative disc disease), lumbar   5. Failed back surgical syndrome   6. 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 Lumbar facet syndrome (Bilateral) (L>R)   8. Chronic hip pain (Left)   9. Abnormal MRI, lumbar spine (09/26/18)   10. Abnormal CT of  lumbar spine (10/11/2018)      Updated Problems: No problems updated.  Plan of Care  Pharmacotherapy (Medications Ordered): No orders of the defined types were placed in this encounter.  Medications administered today: Lynn Potter had no medications administered during this visit.  Orders:  Orders Placed This Encounter  Procedures  . Lumbar Epidural Injection    Standing Status:   Future    Standing Expiration Date:   01/15/2019    Scheduling Instructions:     Procedure: Interlaminar Lumbar Epidural Steroid injection (LESI)  L2-3     Laterality: Right-sided     Sedation: With Sedation.     Timeframe: ASAA    Order Specific Question:   Where will this procedure be performed?    Answer:   [facility]  . Lumbar Transforaminal Epidural    Standing Status:   Future    Standing Expiration Date:   01/15/2019    Scheduling Instructions:     Side: Left-sided     Level: L3     Sedation: With Sedation.     Timeframe: ASAA    Order Specific Question:   Where will this procedure be performed?    Answer:   [facility]   Lab Orders  No laboratory test(s) ordered today   Imaging Orders  No imaging studies ordered today   Referral Orders  No referral(s) requested today   Planned follow-up:   Return for Procedure (w/ sedation): (L) L3 TFESI #1 + (R) L2-3 LESI #1.    Interventional management options: Considering:    Diagnostic right L3-4 LESI  Diagnostic bilateral L3 transforaminal LESI  Diagnostic bilateral lumbar facet block #2  Possible bilateral lumbar facet RFA  Diagnostic bilateral suprascapular nerve block  Possible bilateral suprascapular RFA  Diagnostic bilateral T11 transforaminal TESI    Palliative PRN treatment(s):   Palliative bilateral lumbar facet blocks    Future Appointments  Date Time Provider [place]  12/21/2018  9:30 AM Milinda Pointer, MD [facility] None  01/25/2019 10:15 AM Edrick Kins, DPM [id number]  07/25/2019  1:30 PM [facility]   Primary Care Physician: Remi Haggard, FNP Location: [facility] Note by: Gaspar Cola, MD Date: 12/15/2018; Time: 3:50 PM

## 2018-12-15 ENCOUNTER — Ambulatory Visit: Payer: Medicare Other | Attending: Pain Medicine | Admitting: Pain Medicine

## 2018-12-15 ENCOUNTER — Encounter: Payer: Self-pay | Admitting: Pain Medicine

## 2018-12-15 ENCOUNTER — Other Ambulatory Visit: Payer: Self-pay

## 2018-12-15 VITALS — BP 129/55 | HR 86 | Temp 98.3°F | Ht 64.0 in | Wt 210.0 lb

## 2018-12-15 DIAGNOSIS — G8929 Other chronic pain: Secondary | ICD-10-CM

## 2018-12-15 DIAGNOSIS — R937 Abnormal findings on diagnostic imaging of other parts of musculoskeletal system: Secondary | ICD-10-CM

## 2018-12-15 DIAGNOSIS — M5136 Other intervertebral disc degeneration, lumbar region: Secondary | ICD-10-CM

## 2018-12-15 DIAGNOSIS — M79605 Pain in left leg: Secondary | ICD-10-CM

## 2018-12-15 DIAGNOSIS — M545 Low back pain: Secondary | ICD-10-CM

## 2018-12-15 DIAGNOSIS — M25511 Pain in right shoulder: Secondary | ICD-10-CM | POA: Diagnosis not present

## 2018-12-15 DIAGNOSIS — M25512 Pain in left shoulder: Secondary | ICD-10-CM

## 2018-12-15 DIAGNOSIS — M961 Postlaminectomy syndrome, not elsewhere classified: Secondary | ICD-10-CM

## 2018-12-15 DIAGNOSIS — M47816 Spondylosis without myelopathy or radiculopathy, lumbar region: Secondary | ICD-10-CM

## 2018-12-15 DIAGNOSIS — M51369 Other intervertebral disc degeneration, lumbar region without mention of lumbar back pain or lower extremity pain: Secondary | ICD-10-CM

## 2018-12-15 DIAGNOSIS — M25552 Pain in left hip: Secondary | ICD-10-CM

## 2018-12-15 DIAGNOSIS — M48062 Spinal stenosis, lumbar region with neurogenic claudication: Secondary | ICD-10-CM

## 2018-12-15 NOTE — Patient Instructions (Signed)

## 2018-12-21 ENCOUNTER — Ambulatory Visit
Admission: RE | Admit: 2018-12-21 | Discharge: 2018-12-21 | Disposition: A | Discharge status: Home / Self Care | Payer: Medicare Other | Source: Ambulatory Visit | Attending: Pain Medicine | Admitting: Pain Medicine

## 2018-12-21 ENCOUNTER — Ambulatory Visit (HOSPITAL_BASED_OUTPATIENT_CLINIC_OR_DEPARTMENT_OTHER): Payer: Medicare Other | Admitting: Pain Medicine

## 2018-12-21 ENCOUNTER — Other Ambulatory Visit: Payer: Self-pay

## 2018-12-21 ENCOUNTER — Encounter: Payer: Self-pay | Admitting: Pain Medicine

## 2018-12-21 VITALS — BP 105/77 | HR 76 | Temp 98.1°F | Resp 17 | Ht 64.0 in | Wt 210.0 lb

## 2018-12-21 DIAGNOSIS — M5136 Other intervertebral disc degeneration, lumbar region: Secondary | ICD-10-CM

## 2018-12-21 DIAGNOSIS — M1612 Unilateral primary osteoarthritis, left hip: Secondary | ICD-10-CM | POA: Diagnosis present

## 2018-12-21 DIAGNOSIS — R937 Abnormal findings on diagnostic imaging of other parts of musculoskeletal system: Secondary | ICD-10-CM

## 2018-12-21 DIAGNOSIS — M48062 Spinal stenosis, lumbar region with neurogenic claudication: Secondary | ICD-10-CM

## 2018-12-21 DIAGNOSIS — G8929 Other chronic pain: Secondary | ICD-10-CM

## 2018-12-21 DIAGNOSIS — M961 Postlaminectomy syndrome, not elsewhere classified: Secondary | ICD-10-CM | POA: Diagnosis present

## 2018-12-21 DIAGNOSIS — M5442 Lumbago with sciatica, left side: Secondary | ICD-10-CM | POA: Insufficient documentation

## 2018-12-21 DIAGNOSIS — M47816 Spondylosis without myelopathy or radiculopathy, lumbar region: Secondary | ICD-10-CM | POA: Insufficient documentation

## 2018-12-21 DIAGNOSIS — M79605 Pain in left leg: Secondary | ICD-10-CM | POA: Insufficient documentation

## 2018-12-21 MED ORDER — ROPIVACAINE HCL 2 MG/ML IJ SOLN
INTRAMUSCULAR | Status: AC
  Filled 2018-12-21: qty 10

## 2018-12-21 MED ORDER — DEXAMETHASONE SODIUM PHOSPHATE 10 MG/ML IJ SOLN
INTRAMUSCULAR | Status: AC
  Filled 2018-12-21: qty 1

## 2018-12-21 MED ORDER — FENTANYL CITRATE (PF) 100 MCG/2ML IJ SOLN
INTRAMUSCULAR | Status: AC
  Filled 2018-12-21: qty 2

## 2018-12-21 MED ORDER — IOPAMIDOL (ISOVUE-M 200) INJECTION 41%
10.0000 mL | Freq: Once | INTRAMUSCULAR | Status: AC
  Administered 2018-12-21: 10 mL via EPIDURAL

## 2018-12-21 MED ORDER — SODIUM CHLORIDE 0.9% FLUSH: 2.0000 mL | Freq: Once | INTRAVENOUS | Status: DC

## 2018-12-21 MED ORDER — LACTATED RINGERS IV SOLN: 1000.0000 mL | Freq: Once | INTRAVENOUS | Status: DC

## 2018-12-21 MED ORDER — ROPIVACAINE HCL 2 MG/ML IJ SOLN
1.0000 mL | Freq: Once | INTRAMUSCULAR | Status: AC
  Administered 2018-12-21: 3 mL via EPIDURAL

## 2018-12-21 MED ORDER — FENTANYL CITRATE (PF) 100 MCG/2ML IJ SOLN
25.0000 ug | INTRAMUSCULAR | Status: DC | PRN
  Administered 2018-12-21: 50 ug via INTRAVENOUS

## 2018-12-21 MED ORDER — MIDAZOLAM HCL 5 MG/5ML IJ SOLN
INTRAMUSCULAR | Status: AC
  Filled 2018-12-21: qty 5

## 2018-12-21 MED ORDER — DEXAMETHASONE SODIUM PHOSPHATE 10 MG/ML IJ SOLN
10.0000 mg | Freq: Once | INTRAMUSCULAR | Status: AC
  Administered 2018-12-21: 10 mg

## 2018-12-21 MED ORDER — TRIAMCINOLONE ACETONIDE 40 MG/ML IJ SUSP
INTRAMUSCULAR | Status: AC
  Filled 2018-12-21: qty 1

## 2018-12-21 MED ORDER — SODIUM CHLORIDE (PF) 0.9 % IJ SOLN
INTRAMUSCULAR | Status: AC
  Filled 2018-12-21: qty 10

## 2018-12-21 MED ORDER — IOPAMIDOL (ISOVUE-M 200) INJECTION 41%
INTRAMUSCULAR | Status: AC
  Filled 2018-12-21: qty 10

## 2018-12-21 MED ORDER — TRIAMCINOLONE ACETONIDE 40 MG/ML IJ SUSP
40.0000 mg | Freq: Once | INTRAMUSCULAR | Status: AC
  Administered 2018-12-21: 40 mg

## 2018-12-21 MED ORDER — MIDAZOLAM HCL 5 MG/5ML IJ SOLN
1.0000 mg | INTRAMUSCULAR | Status: DC | PRN
  Administered 2018-12-21: 2 mg via INTRAVENOUS

## 2018-12-21 MED ORDER — LIDOCAINE HCL 2 % IJ SOLN
20.0000 mL | Freq: Once | INTRAMUSCULAR | Status: AC
  Administered 2018-12-21: 400 mg

## 2018-12-21 MED ORDER — ROPIVACAINE HCL 2 MG/ML IJ SOLN: 2.0000 mL | Freq: Once | INTRAMUSCULAR | Status: DC

## 2018-12-21 MED ORDER — LIDOCAINE HCL 2 % IJ SOLN
INTRAMUSCULAR | Status: AC
  Filled 2018-12-21: qty 20

## 2018-12-21 MED ORDER — SODIUM CHLORIDE 0.9% FLUSH
1.0000 mL | Freq: Once | INTRAVENOUS | Status: AC
  Administered 2018-12-21: 3 mL

## 2018-12-21 NOTE — Patient Instructions (Signed)

## 2018-12-21 NOTE — Progress Notes (Signed)
Safety precautions to be maintained throughout the outpatient stay will include: orient to surroundings, keep bed in low position, maintain call bell within reach at all times, provide assistance with transfer out of bed and ambulation.  

## 2018-12-21 NOTE — Progress Notes (Signed)
Patient's Name: Lynn Potter  MRN: [id number]  Referring Provider: Remi Haggard, FNP  DOB: [birth year]  PCP: Remi Haggard, FNP  DOS: 12/21/2018  Note by: Gaspar Cola, MD  Service setting: Ambulatory outpatient  Specialty: Interventional Pain Management  Patient type: Established  Location: [facility] (AMB) Pain Management Facility  Visit type: Interventional Procedure   Primary Reason for Visit: Interventional Pain Management Treatment. CC: Back Pain (lower)  Procedure #1:  Anesthesia, Analgesia, Anxiolysis:  Type: Therapeutic Trans-Foraminal Epidural Steroid Injection   #1  Region: Lumbar Level: L3 Paravertebral Laterality: Left-Sided Paravertebral   Type: Moderate (Conscious) Sedation combined with Local Anesthesia Indication(s): Analgesia and Anxiety Route: Intravenous (IV) IV Access: Secured Sedation: Meaningful verbal contact was maintained at all times during the procedure  Local Anesthetic: Lidocaine 1-2%  Position: Prone  Procedure #2:    Type: Therapeutic Inter-Laminar Epidural Steroid Injection   #1  Region: Lumbar Level: L2-3 Level. Laterality: Right-Sided Paramedial     Indications: 1. DDD (degenerative disc disease), lumbar   2. 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 Failed back surgical syndrome   4. Chronic low back pain (Primary area of Pain) (Bilateral) (L>R) w/ sciatica (Left)   5. Chronic lower extremity pain (Secondary Area of Pain) (Left)   6. Lumbar spondylosis   7. Osteoarthritis of hip (Left)   8. Abnormal MRI, lumbar spine (09/26/18)   9. Abnormal CT of  lumbar spine (10/11/2018)    Pain Score: Pre-procedure: 5 /10 Post-procedure: 0-No pain/10  Pre-op Assessment:  Lynn Potter is a 73 y.o. (year old), female patient, seen today for interventional treatment. She  has a past surgical history that includes Joint replacement; Total knee arthroplasty; Abdominal hysterectomy; Cholecystectomy; Colonoscopy  with esophagogastroduodenoscopy (egd); carpal tunnell; Eye surgery; Cataract extraction; Back surgery; Lumbar fusion; Upper esophageal endoscopic ultrasound (eus) (N/A, 01/03/2016); and Esophagogastroduodenoscopy (egd) with propofol (N/A, 02/16/2017). Lynn Potter has a current medication list which includes the following prescription(s): accu-chek aviva plus, accu-chek softclix lancets, amlodipine, vitamin c, aspirin, azelastine, baclofen, accu-chek aviva plus, cholecalciferol, ferrous sulfate, fluconazole, hydroxyzine, insulin aspart, insulin lispro, jardiance, latanoprost, levemir flextouch, metformin, methocarbamol, olmesartan-hydrochlorothiazide, pantoprazole, pregabalin, and atorvastatin, and the following Facility-Administered Medications: fentanyl, lactated ringers, midazolam, ropivacaine (pf) 2 mg/ml (0.2%), and sodium chloride flush. Her primarily concern today is the Back Pain (lower)  Initial Vital Signs:  Pulse/HCG Rate: 76ECG Heart Rate: 67 Temp: 98 F (36.7 C) Resp: 16 BP: (!) 125/57 SpO2: 100 %  BMI: Estimated body mass index is 36.05 kg/m as calculated from the following:   Height as of this encounter: 5\' 4"  (1.626 m).   Weight as of this encounter: 210 lb (95.3 kg).  Risk Assessment: Allergies: Reviewed. She has No Known Allergies.  Allergy Precautions: None required Coagulopathies: Reviewed. None identified.  Blood-thinner therapy: None at this time Active Infection(s): Reviewed. None identified. Lynn Potter is afebrile  Site Confirmation: Lynn Potter was asked to confirm the procedure and laterality before marking the site Procedure checklist: Completed Consent: Before the procedure and under the influence of no sedative(s), amnesic(s), or anxiolytics, the patient was informed of the treatment options, risks and possible complications. To fulfill our ethical and legal obligations, as recommended by the American Medical Association's Code of Ethics, I have informed the patient of  my clinical impression; the nature and purpose of the treatment or procedure; the risks, benefits, and possible complications of the intervention; the alternatives, including doing nothing; the risk(s) and benefit(s) of the alternative  treatment(s) or procedure(s); and the risk(s) and benefit(s) of doing nothing. The patient was provided information about the general risks and possible complications associated with the procedure. These may include, but are not limited to: failure to achieve desired goals, infection, bleeding, organ or nerve damage, allergic reactions, paralysis, and death. In addition, the patient was informed of those risks and complications associated to Spine-related procedures, such as failure to decrease pain; infection (i.e.: Meningitis, epidural or intraspinal abscess); bleeding (i.e.: epidural hematoma, subarachnoid hemorrhage, or any other type of intraspinal or peri-dural bleeding); organ or nerve damage (i.e.: Any type of peripheral nerve, nerve root, or spinal cord injury) with subsequent damage to sensory, motor, and/or autonomic systems, resulting in permanent pain, numbness, and/or weakness of one or several areas of the body; allergic reactions; (i.e.: anaphylactic reaction); and/or death. Furthermore, the patient was informed of those risks and complications associated with the medications. These include, but are not limited to: allergic reactions (i.e.: anaphylactic or anaphylactoid reaction(s)); adrenal axis suppression; blood sugar elevation that in diabetics may result in ketoacidosis or comma; water retention that in patients with history of congestive heart failure may result in shortness of breath, pulmonary edema, and decompensation with resultant heart failure; weight gain; swelling or edema; medication-induced neural toxicity; particulate matter embolism and blood vessel occlusion with resultant organ, and/or nervous system infarction; and/or aseptic necrosis of one or  more joints. Finally, the patient was informed that Medicine is not an exact science; therefore, there is also the possibility of unforeseen or unpredictable risks and/or possible complications that may result in a catastrophic outcome. The patient indicated having understood very clearly. We have given the patient no guarantees and we have made no promises. Enough time was given to the patient to ask questions, all of which were answered to the patient's satisfaction. Lynn Potter has indicated that she wanted to continue with the procedure. Attestation: I, the ordering provider, attest that I have discussed with the patient the benefits, risks, side-effects, alternatives, likelihood of achieving goals, and potential problems during recovery for the procedure that I have provided informed consent. Date  Time: 12/21/2018  9:32 AM  Pre-Procedure Preparation:  Monitoring: As per clinic protocol. Respiration, ETCO2, SpO2, BP, heart rate and rhythm monitor placed and checked for adequate function Safety Precautions: Patient was assessed for positional comfort and pressure points before starting the procedure. Time-out: I initiated and conducted the "Time-out" before starting the procedure, as per protocol. The patient was asked to participate by confirming the accuracy of the "Time Out" information. Verification of the correct person, site, and procedure were performed and confirmed by me, the nursing staff, and the patient. "Time-out" conducted as per Joint Commission's Universal Protocol (UP.01.01.01). Time: 1035  Description of Procedure #1:  Target Area: The inferior and lateral portion of the pedicle, just lateral to a line created by the 6:00 position of the pedicle and the superior articular process of the vertebral body below. On the lateral view, this target lies just posterior to the anterior aspect of the lamina and posterior to the midpoint created between the anterior and the posterior aspect of  the neural foramina. Approach: Posterior paravertebral approach. Area Prepped: Entire Posterior Lumbosacral Region Prepping solution: ChloraPrep (2% chlorhexidine gluconate and 70% isopropyl alcohol) Safety Precautions: Aspiration looking for blood return was conducted prior to all injections. At no point did we inject any substances, as a needle was being advanced. No attempts were made at seeking any paresthesias. Safe injection practices and needle disposal  techniques used. Medications properly checked for expiration dates. SDV (single dose vial) medications used.  Description of the Procedure: Protocol guidelines were followed. The patient was placed in position over the fluoroscopy table. The target area was identified and the area prepped in the usual manner. Skin & deeper tissues infiltrated with local anesthetic. Appropriate amount of time allowed to pass for local anesthetics to take effect. The procedure needles were then advanced to the target area. Proper needle placement secured. Negative aspiration confirmed. Solution injected in intermittent fashion, asking for systemic symptoms every 0.2cc of injectate. The needles were then removed and the area cleansed, making sure to leave some of the prepping solution back to take advantage of its long term bactericidal properties.  Start Time: 1035 hrs.  Materials:  Needle(s) Type: Spinal Needle Gauge: 22G Length: 3.5-in Medication(s): Please see orders for medications and dosing details.  Description of Procedure #2:  Target Area: The  interlaminar space, initially targeting the lower border of the superior vertebral body lamina. Approach: Posterior paramedial approach. Area Prepped: Same as above Prepping solution: Same as above Safety Precautions: Same as above  Description of the Procedure: Protocol guidelines were followed. The patient was placed in position over the fluoroscopy table. The target area was identified and the area  prepped in the usual manner. Skin & deeper tissues infiltrated with local anesthetic. Appropriate amount of time allowed to pass for local anesthetics to take effect. The procedure needle was introduced through the skin, ipsilateral to the reported pain, and advanced to the target area. Bone was contacted and the needle walked caudad, until the lamina was cleared. The ligamentum flavum was engaged and loss-of-resistance technique used as the epidural needle was advanced. The epidural space was identified using "loss-of-resistance technique" with 2-3 ml of PF-NaCl (0.9% NSS), in a 5cc LOR glass syringe. Proper needle placement secured. Negative aspiration confirmed. Solution injected in intermittent fashion, asking for systemic symptoms every 0.5cc of injectate. The needles were then removed and the area cleansed, making sure to leave some of the prepping solution back to take advantage of its long term bactericidal properties.  Vitals:   12/21/18 1050 12/21/18 1100 12/21/18 1110 12/21/18 1120  BP: (!) 113/58 116/65 103/77 105/77  Pulse:      Resp: 19 18 16 17   Temp:  97.6 F (36.4 C)  98.1 F (36.7 C)  TempSrc:      SpO2: 100% 100% 98% 100%  Weight:      Height:        End Time: 1047 hrs.  Materials:  Needle(s) Type: Epidural needle Gauge: 17G Length: 3.5-in Medication(s): Please see orders for medications and dosing details.  Imaging Guidance (Spinal):          Type of Imaging Technique: Fluoroscopy Guidance (Spinal) Indication(s): Assistance in needle guidance and placement for procedures requiring needle placement in or near specific anatomical locations not easily accessible without such assistance. Exposure Time: Please see nurses notes. Contrast: Before injecting any contrast, we confirmed that the patient did not have an allergy to iodine, shellfish, or radiological contrast. Once satisfactory needle placement was completed at the desired level, radiological contrast was injected.  Contrast injected under live fluoroscopy. No contrast complications. See chart for type and volume of contrast used. Fluoroscopic Guidance: I was personally present during the use of fluoroscopy. "Tunnel Vision Technique" used to obtain the best possible view of the target area. Parallax error corrected before commencing the procedure. "Direction-depth-direction" technique used to introduce the needle under continuous  pulsed fluoroscopy. Once target was reached, antero-posterior, oblique, and lateral fluoroscopic projection used confirm needle placement in all planes. Images permanently stored in EMR. Interpretation: I personally interpreted the imaging intraoperatively. Adequate needle placement confirmed in multiple planes. Appropriate spread of contrast into desired area was observed. No evidence of afferent or efferent intravascular uptake. No intrathecal or subarachnoid spread observed. Permanent images saved into the patient's record.  Antibiotic Prophylaxis:   Anti-infectives (From admission, onward)   None     Indication(s): None identified  Post-operative Assessment:  Post-procedure Vital Signs:  Pulse/HCG Rate: 7672 Temp: 98.1 F (36.7 C) Resp: 17 BP: 105/77 SpO2: 100 %  EBL: None  Complications: No immediate post-treatment complications observed by team, or reported by patient.  Note: The patient tolerated the entire procedure well. A repeat set of vitals were taken after the procedure and the patient was kept under observation following institutional policy, for this type of procedure. Post-procedural neurological assessment was performed, showing return to baseline, prior to discharge. The patient was provided with post-procedure discharge instructions, including a section on how to identify potential problems. Should any problems arise concerning this procedure, the patient was given instructions to immediately contact us, at any time, without hesitation. In any case, we plan  to contact the patient by telephone for a follow-up status report regarding this interventional procedure.  Comments:  No additional relevant information.  Plan of Care  Orders:  Orders Placed This Encounter  Procedures  . Lumbar Transforaminal Epidural    Scheduling Instructions:     Side: Left-sided     Level: L3     Sedation: With Sedation.     Timeframe: Today    Order Specific Question:   Where will this procedure be performed?    Answer:   [facility]  . Lumbar Epidural Injection    Scheduling Instructions:     Procedure: Interlaminar LESI L2-3     Laterality: Right-sided     Sedation: With Sedation     Timeframe:  Today    Order Specific Question:   Where will this procedure be performed?    Answer:   [facility]  . DG C-Arm 1-60 Min-No Report    Intraoperative interpretation by procedural physician at [place].    Standing Status:   Standing    Number of Occurrences:   1    Order Specific Question:   Reason for exam:    Answer:   Assistance in needle guidance and placement for procedures requiring needle placement in or near specific anatomical locations not easily accessible without such assistance.  . Provider attestation of informed consent for procedure/surgical case    I, the ordering provider, attest that I have discussed with the patient the benefits, risks, side effects, alternatives, likelihood of achieving goals and potential problems during recovery for the procedure that I have provided informed consent.  . Informed Consent Details: Transcribe to consent form and obtain patient signature    Surgeon: Ashvin Adelson A. Dossie Arbour, MD    Scheduling Instructions:     Procedure: Diagnostic lumbar transforaminal epidural steroid injection under fluoroscopic guidance. 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 Informed Consent Details: Transcribe to consent form  and obtain patient signature    Surgeon: Riona Lahti A. Dossie Arbour, MD    Scheduling Instructions:     Procedure: Lumbar epidural steroid injection under fluoroscopic guidance     Indications: Low back and/or lower  extremity pain secondary to lumbar radiculitis   Medications ordered for procedure: Meds ordered this encounter  Medications  . iopamidol (ISOVUE-M) 41 % intrathecal injection 10 mL    Must be Myelogram-compatible. If not available, you may substitute with a water-soluble, non-ionic, hypoallergenic, myelogram-compatible radiological contrast medium.  Marland Kitchen lidocaine (XYLOCAINE) 2 % (with pres) injection 400 mg  . sodium chloride flush (NS) 0.9 % injection 1 mL  . ropivacaine (PF) 2 mg/mL (0.2%) (NAROPIN) injection 1 mL  . dexamethasone (DECADRON) injection 10 mg  . sodium chloride flush (NS) 0.9 % injection 2 mL  . ropivacaine (PF) 2 mg/mL (0.2%) (NAROPIN) injection 2 mL  . triamcinolone acetonide (KENALOG-40) injection 40 mg  . midazolam (VERSED) 5 MG/5ML injection 1-2 mg    Make sure Flumazenil is available in the pyxis when using this medication. If oversedation occurs, administer 0.2 mg IV over 15 sec. If after 45 sec no response, administer 0.2 mg again over 1 min; may repeat at 1 min intervals; not to exceed 4 doses (1 mg)  . fentaNYL (SUBLIMAZE) injection 25-50 mcg    Make sure Narcan is available in the pyxis when using this medication. In the event of respiratory depression (RR< 8/min): Titrate NARCAN (naloxone) in increments of 0.1 to 0.2 mg IV at 2-3 minute intervals, until desired degree of reversal.  . lactated ringers infusion 1,000 mL   Medications administered: We administered iopamidol, lidocaine, sodium chloride flush, ropivacaine (PF) 2 mg/mL (0.2%), dexamethasone, triamcinolone acetonide, midazolam, and fentaNYL.  See the medical record for exact dosing, route, and time of administration.  Disposition: Discharge home  Discharge Date & Time: 12/21/2018; 1121 hrs.    Follow-up plan:   Return for PPE (2 wks) w/ Dr. Dossie Arbour.     Future Appointments  Date Time Provider [place]  01/10/2019  1:30 PM Milinda Pointer, MD [facility] None  01/25/2019 10:15 AM Edrick Kins, DPM [facility]  07/25/2019  1:30 PM [facility]   Primary Care Physician: Remi Haggard, FNP Location: [facility] Note by: Gaspar Cola, MD Date: 12/21/2018; Time: 11:28 AM  Disclaimer:  Medicine is not an Chief Strategy Officer. The only guarantee in medicine is that nothing is guaranteed. It is important to note that the decision to proceed with this intervention was based on the information collected from the patient. The Data and conclusions were drawn from the patient's questionnaire, the interview, and the physical examination. Because the information was provided in large part by the patient, it cannot be guaranteed that it has not been purposely or unconsciously manipulated. Every effort has been made to obtain as much relevant data as possible for this evaluation. It is important to note that the conclusions that lead to this procedure are derived in large part from the available data. Always take into account that the treatment will also be dependent on availability of resources and existing treatment guidelines, considered by other Pain Management Practitioners as being common knowledge and practice, at the time of the intervention. For Medico-Legal purposes, it is also important to point out that variation in procedural techniques and pharmacological choices are the acceptable norm. The indications, contraindications, technique, and results of the above procedure should only be interpreted and judged by a Board-Certified Interventional Pain Specialist with extensive familiarity and expertise in the same exact procedure and technique.

## 2018-12-22 ENCOUNTER — Telehealth: Payer: Self-pay

## 2018-12-22 NOTE — Telephone Encounter (Signed)
Post procedure phone call.  Patient is doing well.

## 2019-01-10 ENCOUNTER — Ambulatory Visit: Payer: Medicare Other | Attending: Pain Medicine | Admitting: Pain Medicine

## 2019-01-10 ENCOUNTER — Other Ambulatory Visit: Payer: Self-pay

## 2019-01-10 DIAGNOSIS — M545 Low back pain: Secondary | ICD-10-CM

## 2019-01-10 DIAGNOSIS — M79605 Pain in left leg: Secondary | ICD-10-CM

## 2019-01-10 DIAGNOSIS — G894 Chronic pain syndrome: Secondary | ICD-10-CM | POA: Diagnosis not present

## 2019-01-10 DIAGNOSIS — M961 Postlaminectomy syndrome, not elsewhere classified: Secondary | ICD-10-CM

## 2019-01-10 DIAGNOSIS — M47816 Spondylosis without myelopathy or radiculopathy, lumbar region: Secondary | ICD-10-CM

## 2019-01-10 DIAGNOSIS — M25511 Pain in right shoulder: Secondary | ICD-10-CM | POA: Diagnosis not present

## 2019-01-10 DIAGNOSIS — M48062 Spinal stenosis, lumbar region with neurogenic claudication: Secondary | ICD-10-CM

## 2019-01-10 DIAGNOSIS — M25512 Pain in left shoulder: Secondary | ICD-10-CM

## 2019-01-10 DIAGNOSIS — G8929 Other chronic pain: Secondary | ICD-10-CM

## 2019-01-10 NOTE — Progress Notes (Signed)
Pain Management Encounter Note - Virtual Visit via Telephone Telehealth (real-time audio visits between healthcare provider and patient).  Patient's Phone No. & Preferred Pharmacy:  [phone] (home); [phone] (mobile); (Preferred) [phone]  [place], [place] [address] [address] Phone: [phone] Fax: [phone]  [id number] - [place], [place] [address] [name] [address] Phone: [phone] Fax: [phone]   Pre-screening note:  Our staff contacted Lynn Potter and offered her an "in person", "face-to-face" appointment versus a telephone encounter. She indicated preferring the telephone encounter, at this time.  Reason for Virtual Visit: COVID-19*  Social distancing based on CDC and AMA recommendations.   I contacted Lynn Potter on 01/10/2019 at 2:35 PM by telephone and clearly identified myself as Gaspar Cola, MD. I verified that I was speaking with the correct person using two identifiers (Name and date of birth: 1946/03/03).  Advanced Informed Consent I sought verbal advanced consent from Lynn Potter for telemedicine interactions and virtual visit. I informed Lynn Potter of the security and privacy concerns, risks, and limitations associated with performing an evaluation and management service by telephone. I also informed Lynn Potter of the availability of "in person" appointments and I informed her of the possibility of a patient responsible charge related to this service. Lynn Potter expressed understanding and agreed to proceed.   Historic Elements   Lynn Potter is a 73 y.o. year old, female patient evaluated today after her last encounter by our practice on 12/22/2018. Lynn Potter  has a past medical history of AF (atrial fibrillation) ([place]), Arthritis, Asthma, Chicken pox, Diabetes mellitus without complication ([place]), Glaucoma, Hemorrhoids, History of hiatal hernia,  Hypertension, Migraines, Obesity, Opiate use (03/16/2017), Personal history of tobacco use, presenting hazards to health (06/18/2015), and Sleep apnea. She also  has a past surgical history that includes Joint replacement; Total knee arthroplasty; Abdominal hysterectomy; Cholecystectomy; Colonoscopy with esophagogastroduodenoscopy (egd); carpal tunnell; Eye surgery; Cataract extraction; Back surgery; Lumbar fusion; Upper esophageal endoscopic ultrasound (eus) (N/A, 01/03/2016); and Esophagogastroduodenoscopy (egd) with propofol (N/A, 02/16/2017). Lynn Potter has a current medication list which includes the following prescription(s): accu-chek aviva plus, accu-chek softclix lancets, amlodipine, vitamin c, aspirin, atorvastatin, azelastine, baclofen, accu-chek aviva plus, cholecalciferol, ferrous sulfate, fluconazole, hydroxyzine, insulin aspart, insulin lispro, jardiance, latanoprost, levemir flextouch, metformin, methocarbamol, olmesartan-hydrochlorothiazide, pantoprazole, and pregabalin. She  reports that she has been smoking cigarettes. She has been smoking about 1.00 pack per day. She has never used smokeless tobacco. She reports that she does not drink alcohol or use drugs. Lynn Potter has No Known Allergies.   HPI  I last saw her on 12/21/2018. She is being evaluated for a post-procedure assessment.  The patient indicates that she had absolutely no pain for approximately 5 days and then it started to come back.  She describes her current pain as being perhaps a 2/10.  She indicates that she can do okay like this for a long time and that she will call us when she needs this again.  I have informed the patient that I will tentatively schedule her to have it repeated if this pain returns.  She understood and agrees with that plan.  Post-Procedure Evaluation  Procedure: Diagnostic left-sided L3 transforaminal LESI #1 + diagnostic right-sided L2-3 interlaminar LESI #1 under fluoroscopic guidance and IV  sedation Pre-procedure pain level:  5/10 Post-procedure: 0/10 (100% relief)  Sedation: Sedation provided.  Effectiveness during initial hour  after procedure(Ultra-Short Term Relief): 100 %  Local anesthetic used: Long-acting (4-6 hours) Effectiveness: Defined as any analgesic benefit obtained secondary to the administration of local anesthetics. This carries significant diagnostic value as to the etiological location, or anatomical origin, of the pain. Duration of benefit is expected to coincide with the duration of the local anesthetic used.  Effectiveness during initial 4-6 hours after procedure(Short-Term Relief): 100 %  Long-term benefit: Defined as any relief past the pharmacologic duration of the local anesthetics.  Effectiveness past the initial 6 hours after procedure(Long-Term Relief): 100 % x 5 days  Current benefits: Defined as benefit that persist at this time.   Analgesia:  90-100% better Function: Lynn Potter reports improvement in function ROM: Lynn Potter reports improvement in ROM  Review of recent tests  DG C-Arm 1-60 Min-No Report Fluoroscopy was utilized by the requesting physician.  No radiographic  interpretation.    Office Visit on 11/29/2018  Component Date Value Ref Range Status  . Vitamin B-12 11/29/2018 297  232 - 1,245 pg/mL Final  . 25-Hydroxy, Vitamin D 11/29/2018 33  ng/mL Final   Comment: Reference Range: All Ages: Target levels 30 - 100   . 25-Hydroxy, Vitamin D-2 11/29/2018 2.5  ng/mL Final  . 25-Hydroxy, Vitamin D-3 11/29/2018 30  ng/mL Final   Assessment  The primary encounter diagnosis was Chronic pain syndrome. 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  Plan of Care  I am having Lynn Potter maintain her aspirin, ferrous sulfate, latanoprost, pregabalin, vitamin C, pantoprazole, metFORMIN, insulin aspart, azelastine, Cholecalciferol, Jardiance, hydrOXYzine, Levemir FlexTouch, olmesartan-hydrochlorothiazide, atorvastatin, amLODipine, fluconazole, Accu-Chek Aviva Plus, baclofen, insulin lispro, Accu-Chek Aviva Plus, Accu-Chek Softclix Lancets, and methocarbamol.  Pharmacotherapy (Medications Ordered): No orders of the defined types were placed in this encounter.  Orders:  Orders Placed This Encounter  Procedures  . Lumbar Transforaminal Epidural    Standing Status:   Future    Standing Expiration Date:   02/09/2019    Scheduling Instructions:     Side: Left-sided     Level: L3     Sedation: With Sedation.     Timeframe: ASAA    Order Specific Question:   Where will this procedure be performed?    Answer:   [facility]  . Lumbar Epidural Injection    Standing Status:   Future    Standing Expiration Date:   02/09/2019    Scheduling Instructions:     Procedure: Interlaminar Lumbar Epidural Steroid injection (LESI)  L2-3     Laterality: Right-sided     Sedation: With Sedation.     Timeframe: ASAA    Order Specific Question:   Where will this procedure be performed?    Answer:   [facility]   Follow-up plan:   Return for Procedure (w/ sedation): (L) L3 TFESI #2 + (R) L2-3 LESI #2.   Interventional management options: Considering:   Diagnostic right L3-4 LESI Diagnostic bilateral L3 transforaminal LESI  Diagnostic bilateral lumbar facet block #2 Possible bilateral lumbar facetRFA Diagnostic bilateralsuprascapularnerve block Possible bilateral suprascapularRFA Diagnostic bilateral T11 transforaminal TESI    Palliative PRN treatment(s):   Palliative bilateral lumbar facet blocks  I discussed the  assessment and treatment plan with the patient. The patient was provided an opportunity to ask questions and all were answered. The patient agreed with the plan and demonstrated an understanding of the instructions.  Patient advised to call back or seek an in-person evaluation if the symptoms or condition worsens.  Total duration of non-face-to-face encounter: 12 minutes.  Note by: Gaspar Cola, MD Date: 01/10/2019; Time: 2:35 PM  Disclaimer:  * Given the special circumstances of the COVID-19 pandemic, the federal government has announced that the Office for Civil Rights (OCR) will exercise its enforcement discretion and will not impose penalties on physicians using telehealth in the event of noncompliance with regulatory requirements under the [place] and Accountability Act (HIPAA) in connection with the good faith provision of telehealth during the HWTUU-82 national public health emergency. ([place])

## 2019-01-10 NOTE — Patient Instructions (Signed)

## 2019-01-25 ENCOUNTER — Ambulatory Visit (INDEPENDENT_AMBULATORY_CARE_PROVIDER_SITE_OTHER): Payer: Medicare Other | Admitting: Podiatry

## 2019-01-25 ENCOUNTER — Ambulatory Visit: Payer: Medicare Other | Admitting: Podiatry

## 2019-01-25 ENCOUNTER — Encounter: Payer: Self-pay | Admitting: Podiatry

## 2019-01-25 ENCOUNTER — Other Ambulatory Visit: Payer: Self-pay

## 2019-01-25 VITALS — Temp 97.9°F

## 2019-01-25 DIAGNOSIS — E0843 Diabetes mellitus due to underlying condition with diabetic autonomic (poly)neuropathy: Secondary | ICD-10-CM | POA: Diagnosis not present

## 2019-01-25 DIAGNOSIS — M79676 Pain in unspecified toe(s): Secondary | ICD-10-CM

## 2019-01-25 DIAGNOSIS — B351 Tinea unguium: Secondary | ICD-10-CM

## 2019-01-25 NOTE — Progress Notes (Signed)
   SUBJECTIVE Patient with a history of diabetes mellitus presents to office today complaining of elongated, thickened nails that cause pain while ambulating in shoes.  She is unable to trim her own nails. Patient is here for further evaluation and treatment.   Past Medical History:  Diagnosis Date  . AF (atrial fibrillation) ([place])   . Arthritis   . Asthma   . Chicken pox   . Diabetes mellitus without complication ([place])   . Glaucoma   . Hemorrhoids   . History of hiatal hernia   . Hypertension   . Migraines   . Obesity   . Opiate use 03/16/2017  . Personal history of tobacco use, presenting hazards to health 06/18/2015  . Sleep apnea     OBJECTIVE General Patient is awake, alert, and oriented x 3 and in no acute distress. Derm Skin is dry and supple bilateral. Negative open lesions or macerations. Remaining integument unremarkable. Nails are tender, long, thickened and dystrophic with subungual debris, consistent with onychomycosis, 1-5 bilateral. No signs of infection noted. Vasc  DP and PT pedal pulses palpable bilaterally. Temperature gradient within normal limits.  Neuro Epicritic and protective threshold sensation diminished bilaterally.  Musculoskeletal Exam No symptomatic pedal deformities noted bilateral. Muscular strength within normal limits.  ASSESSMENT 1. Diabetes Mellitus w/ peripheral neuropathy 2. Onychomycosis of nail due to dermatophyte bilateral 3. Pain in foot bilateral  PLAN OF CARE 1. Patient evaluated today. 2. Instructed to maintain good pedal hygiene and foot care. Stressed importance of controlling blood sugar.  3. Mechanical debridement of nails 1-5 bilaterally performed using a nail nipper. Filed with dremel without incident.  4. Return to clinic in 3 mos.     Edrick Kins, DPM [facility]  Dr. Edrick Kins, [address]                                        [place], [place] [address]                Office [phone]  Fax [phone]

## 2019-03-03 ENCOUNTER — Other Ambulatory Visit: Payer: Self-pay | Admitting: Pain Medicine

## 2019-03-03 DIAGNOSIS — Z01818 Encounter for other preprocedural examination: Secondary | ICD-10-CM | POA: Insufficient documentation

## 2019-03-07 ENCOUNTER — Other Ambulatory Visit: Payer: Self-pay

## 2019-03-07 ENCOUNTER — Other Ambulatory Visit
Admission: RE | Admit: 2019-03-07 | Discharge: 2019-03-07 | Disposition: A | Discharge status: Home / Self Care | Payer: Medicare Other | Source: Ambulatory Visit | Attending: Pain Medicine | Admitting: Pain Medicine

## 2019-03-07 DIAGNOSIS — Z1159 Encounter for screening for other viral diseases: Secondary | ICD-10-CM | POA: Diagnosis present

## 2019-03-08 LAB — NOVEL CORONAVIRUS, NAA (HOSP ORDER, SEND-OUT TO REF LAB; TAT 18-24 HRS): SARS-CoV-2, NAA: NOT DETECTED

## 2019-03-10 ENCOUNTER — Ambulatory Visit
Admission: RE | Admit: 2019-03-10 | Discharge: 2019-03-10 | Disposition: A | Discharge status: Home / Self Care | Payer: Medicare Other | Source: Ambulatory Visit | Attending: Pain Medicine | Admitting: Pain Medicine

## 2019-03-10 ENCOUNTER — Other Ambulatory Visit: Payer: Self-pay

## 2019-03-10 ENCOUNTER — Encounter: Payer: Self-pay | Admitting: Pain Medicine

## 2019-03-10 ENCOUNTER — Ambulatory Visit (HOSPITAL_BASED_OUTPATIENT_CLINIC_OR_DEPARTMENT_OTHER): Payer: Medicare Other | Admitting: Pain Medicine

## 2019-03-10 VITALS — BP 103/69 | HR 89 | Temp 98.4°F | Resp 19 | Ht 64.0 in | Wt 206.0 lb

## 2019-03-10 DIAGNOSIS — M47816 Spondylosis without myelopathy or radiculopathy, lumbar region: Secondary | ICD-10-CM

## 2019-03-10 DIAGNOSIS — M48062 Spinal stenosis, lumbar region with neurogenic claudication: Secondary | ICD-10-CM | POA: Insufficient documentation

## 2019-03-10 DIAGNOSIS — G8929 Other chronic pain: Secondary | ICD-10-CM | POA: Insufficient documentation

## 2019-03-10 DIAGNOSIS — M79605 Pain in left leg: Secondary | ICD-10-CM | POA: Insufficient documentation

## 2019-03-10 DIAGNOSIS — M5136 Other intervertebral disc degeneration, lumbar region: Secondary | ICD-10-CM | POA: Diagnosis not present

## 2019-03-10 DIAGNOSIS — M1612 Unilateral primary osteoarthritis, left hip: Secondary | ICD-10-CM | POA: Diagnosis present

## 2019-03-10 DIAGNOSIS — M961 Postlaminectomy syndrome, not elsewhere classified: Secondary | ICD-10-CM

## 2019-03-10 DIAGNOSIS — R937 Abnormal findings on diagnostic imaging of other parts of musculoskeletal system: Secondary | ICD-10-CM | POA: Insufficient documentation

## 2019-03-10 DIAGNOSIS — M5442 Lumbago with sciatica, left side: Secondary | ICD-10-CM | POA: Diagnosis present

## 2019-03-10 MED ORDER — FENTANYL CITRATE (PF) 100 MCG/2ML IJ SOLN
25.0000 ug | INTRAMUSCULAR | Status: DC | PRN
  Administered 2019-03-10: 50 ug via INTRAVENOUS

## 2019-03-10 MED ORDER — MIDAZOLAM HCL 5 MG/5ML IJ SOLN
INTRAMUSCULAR | Status: AC
  Filled 2019-03-10: qty 5

## 2019-03-10 MED ORDER — ROPIVACAINE HCL 2 MG/ML IJ SOLN
1.0000 mL | Freq: Once | INTRAMUSCULAR | Status: AC
  Administered 2019-03-10: 1 mL via EPIDURAL
  Filled 2019-03-10: qty 10

## 2019-03-10 MED ORDER — TRIAMCINOLONE ACETONIDE 40 MG/ML IJ SUSP
40.0000 mg | Freq: Once | INTRAMUSCULAR | Status: AC
  Administered 2019-03-10: 40 mg
  Filled 2019-03-10: qty 1

## 2019-03-10 MED ORDER — FENTANYL CITRATE (PF) 100 MCG/2ML IJ SOLN
INTRAMUSCULAR | Status: AC
  Filled 2019-03-10: qty 2

## 2019-03-10 MED ORDER — LACTATED RINGERS IV SOLN: 1000.0000 mL | Freq: Once | INTRAVENOUS | Status: DC

## 2019-03-10 MED ORDER — IOHEXOL 180 MG/ML  SOLN
10.0000 mL | Freq: Once | INTRAMUSCULAR | Status: AC
  Administered 2019-03-10: 10 mL via EPIDURAL

## 2019-03-10 MED ORDER — DEXAMETHASONE SODIUM PHOSPHATE 10 MG/ML IJ SOLN
10.0000 mg | Freq: Once | INTRAMUSCULAR | Status: AC
  Administered 2019-03-10: 10 mg
  Filled 2019-03-10: qty 1

## 2019-03-10 MED ORDER — MIDAZOLAM HCL 5 MG/5ML IJ SOLN
1.0000 mg | INTRAMUSCULAR | Status: DC | PRN
  Administered 2019-03-10: 2 mg via INTRAVENOUS

## 2019-03-10 MED ORDER — LIDOCAINE HCL 2 % IJ SOLN
20.0000 mL | Freq: Once | INTRAMUSCULAR | Status: AC
  Administered 2019-03-10: 400 mg

## 2019-03-10 MED ORDER — SODIUM CHLORIDE 0.9% FLUSH
1.0000 mL | Freq: Once | INTRAVENOUS | Status: AC
  Administered 2019-03-10: 10 mL

## 2019-03-10 MED ORDER — ROPIVACAINE HCL 2 MG/ML IJ SOLN
2.0000 mL | Freq: Once | INTRAMUSCULAR | Status: AC
  Administered 2019-03-10: 2 mL via EPIDURAL
  Filled 2019-03-10: qty 10

## 2019-03-10 MED ORDER — SODIUM CHLORIDE 0.9% FLUSH
2.0000 mL | Freq: Once | INTRAVENOUS | Status: AC
  Administered 2019-03-10: 2 mL

## 2019-03-10 NOTE — Progress Notes (Addendum)
Patient's Name: Lynn Potter  MRN: [id number]  Referring Provider: Milinda Pointer, MD  DOB: 1946/09/14  PCP: Remi Haggard, FNP  DOS: 03/10/2019  Note by: Gaspar Cola, MD  Service setting: Ambulatory outpatient  Specialty: Interventional Pain Management  Patient type: Established  Location: [facility] (AMB) Pain Management Facility  Visit type: Interventional Procedure   Primary Reason for Visit: Interventional Pain Management Treatment. CC: Back Pain (low) and Hip Pain (left)  Procedure #1:  Anesthesia, Analgesia, Anxiolysis:  Type: Therapeutic Trans-Foraminal Epidural Steroid Injection   #2  Region: Lumbar Level: L3 Paravertebral Laterality: Left-Sided Paravertebral   Type: Moderate (Conscious) Sedation combined with Local Anesthesia Indication(s): Analgesia and Anxiety Route: Intravenous (IV) IV Access: Secured Sedation: Meaningful verbal contact was maintained at all times during the procedure  Local Anesthetic: Lidocaine 1-2%  Position: Prone  Procedure #2:    Type: Therapeutic Inter-Laminar Epidural Steroid Injection   #2  Region: Lumbar Level: L2-3 Level. Laterality: Right-Sided             Indications: 1. DDD (degenerative disc disease), lumbar   2. Chronic low back pain (Primary area of Pain) (Bilateral) (L>R) w/ sciatica (Left)   3. Failed back surgical syndrome   4. 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 Lumbar spondylosis   6. Chronic lower extremity pain (Secondary Area of Pain) (Left)    Pain Score: Pre-procedure: 5 /10 Post-procedure: 0-No pain/10  Pre-op Assessment:  Lynn Potter is a 73 y.o. (year old), female patient, seen today for interventional treatment. She  has a past surgical history that includes Joint replacement; Total knee arthroplasty; Abdominal hysterectomy; Cholecystectomy; Colonoscopy with esophagogastroduodenoscopy (egd); carpal tunnell; Eye surgery; Cataract extraction; Back surgery; Lumbar  fusion; Upper esophageal endoscopic ultrasound (eus) (N/A, 01/03/2016); and Esophagogastroduodenoscopy (egd) with propofol (N/A, 02/16/2017). Lynn Potter has a current medication list which includes the following prescription(s): accu-chek aviva plus, accu-chek softclix lancets, amlodipine, vitamin c, aspirin, atorvastatin, azelastine, baclofen, accu-chek aviva plus, chantix, cholecalciferol, combivent respimat, ferrous sulfate, hydroxyzine, insulin lispro, jardiance, klor-con m10, latanoprost, levemir flextouch, metformin, methocarbamol, olmesartan-hydrochlorothiazide, pantoprazole, pregabalin, ropinirole, fluconazole, and insulin aspart, and the following Facility-Administered Medications: fentanyl, lactated ringers, and midazolam. Her primarily concern today is the Back Pain (low) and Hip Pain (left)  Initial Vital Signs:  Pulse/HCG Rate: 97ECG Heart Rate: 82 Temp: 98.7 F (37.1 C) Resp: 16 BP: (!) 86/64 SpO2: 100 %  BMI: Estimated body mass index is 35.36 kg/m as calculated from the following:   Height as of this encounter: 5\' 4"  (1.626 m).   Weight as of this encounter: 206 lb (93.4 kg).  Risk Assessment: Allergies: Reviewed. She has No Known Allergies.  Allergy Precautions: None required Coagulopathies: Reviewed. None identified.  Blood-thinner therapy: None at this time Active Infection(s): Reviewed. None identified. Lynn Potter is afebrile  Site Confirmation: Lynn Potter was asked to confirm the procedure and laterality before marking the site Procedure checklist: Completed Consent: Before the procedure and under the influence of no sedative(s), amnesic(s), or anxiolytics, the patient was informed of the treatment options, risks and possible complications. To fulfill our ethical and legal obligations, as recommended by the American Medical Association's Code of Ethics, I have informed the patient of my clinical impression; the nature and purpose of the treatment or procedure; the risks,  benefits, and possible complications of the intervention; the alternatives, including doing nothing; the risk(s) and benefit(s) of the alternative treatment(s) or procedure(s); and the risk(s) and benefit(s) of doing nothing. The patient  was provided information about the general risks and possible complications associated with the procedure. These may include, but are not limited to: failure to achieve desired goals, infection, bleeding, organ or nerve damage, allergic reactions, paralysis, and death. In addition, the patient was informed of those risks and complications associated to Spine-related procedures, such as failure to decrease pain; infection (i.e.: Meningitis, epidural or intraspinal abscess); bleeding (i.e.: epidural hematoma, subarachnoid hemorrhage, or any other type of intraspinal or peri-dural bleeding); organ or nerve damage (i.e.: Any type of peripheral nerve, nerve root, or spinal cord injury) with subsequent damage to sensory, motor, and/or autonomic systems, resulting in permanent pain, numbness, and/or weakness of one or several areas of the body; allergic reactions; (i.e.: anaphylactic reaction); and/or death. Furthermore, the patient was informed of those risks and complications associated with the medications. These include, but are not limited to: allergic reactions (i.e.: anaphylactic or anaphylactoid reaction(s)); adrenal axis suppression; blood sugar elevation that in diabetics may result in ketoacidosis or comma; water retention that in patients with history of congestive heart failure may result in shortness of breath, pulmonary edema, and decompensation with resultant heart failure; weight gain; swelling or edema; medication-induced neural toxicity; particulate matter embolism and blood vessel occlusion with resultant organ, and/or nervous system infarction; and/or aseptic necrosis of one or more joints. Finally, the patient was informed that Medicine is not an exact science;  therefore, there is also the possibility of unforeseen or unpredictable risks and/or possible complications that may result in a catastrophic outcome. The patient indicated having understood very clearly. We have given the patient no guarantees and we have made no promises. Enough time was given to the patient to ask questions, all of which were answered to the patient's satisfaction. Ms. Romas has indicated that she wanted to continue with the procedure. Attestation: I, the ordering provider, attest that I have discussed with the patient the benefits, risks, side-effects, alternatives, likelihood of achieving goals, and potential problems during recovery for the procedure that I have provided informed consent. Date  Time: 03/10/2019  1:00 PM  Pre-Procedure Preparation:  Monitoring: As per clinic protocol. Respiration, ETCO2, SpO2, BP, heart rate and rhythm monitor placed and checked for adequate function Safety Precautions: Patient was assessed for positional comfort and pressure points before starting the procedure. Time-out: I initiated and conducted the "Time-out" before starting the procedure, as per protocol. The patient was asked to participate by confirming the accuracy of the "Time Out" information. Verification of the correct person, site, and procedure were performed and confirmed by me, the nursing staff, and the patient. "Time-out" conducted as per Joint Commission's Universal Protocol (UP.01.01.01). Time: 1412  Description of Procedure #1:  Target Area: The inferior and lateral portion of the pedicle, just lateral to a line created by the 6:00 position of the pedicle and the superior articular process of the vertebral body below. On the lateral view, this target lies just posterior to the anterior aspect of the lamina and posterior to the midpoint created between the anterior and the posterior aspect of the neural foramina. Approach: Posterior paravertebral approach. Area Prepped: Entire  Posterior Lumbosacral Region Prepping solution: 37M DuraPrep (Iodine Povacrylex [0.7% available iodine] and Isopropyl Alcohol, 74% w/w) Safety Precautions: Aspiration looking for blood return was conducted prior to all injections. At no point did we inject any substances, as a needle was being advanced. No attempts were made at seeking any paresthesias. Safe injection practices and needle disposal techniques used. Medications properly checked for expiration dates. SDV (  single dose vial) medications used.  Description of the Procedure: Protocol guidelines were followed. The patient was placed in position over the fluoroscopy table. The target area was identified and the area prepped in the usual manner. Skin & deeper tissues infiltrated with local anesthetic. Appropriate amount of time allowed to pass for local anesthetics to take effect. The procedure needles were then advanced to the target area. Proper needle placement secured. Negative aspiration confirmed. Solution injected in intermittent fashion, asking for systemic symptoms every 0.2cc of injectate. The needles were then removed and the area cleansed, making sure to leave some of the prepping solution back to take advantage of its long term bactericidal properties.  Start Time: 1412 hrs.  Materials:  Needle(s) Type: Spinal Needle Gauge: 22G Length: 3.5-in Medication(s): Please see orders for medications and dosing details.  Description of Procedure #2:  Target Area: The  interlaminar space, initially targeting the lower border of the superior vertebral body lamina. Approach: Posterior paramedial approach. Area Prepped: Same as above Prepping solution: Same as above Safety Precautions: Same as above  Description of the Procedure: Protocol guidelines were followed. The patient was placed in position over the fluoroscopy table. The target area was identified and the area prepped in the usual manner. Skin & deeper tissues infiltrated with  local anesthetic. Appropriate amount of time allowed to pass for local anesthetics to take effect. The procedure needle was introduced through the skin, ipsilateral to the reported pain, and advanced to the target area. Bone was contacted and the needle walked caudad, until the lamina was cleared. The ligamentum flavum was engaged and loss-of-resistance technique used as the epidural needle was advanced. The epidural space was identified using "loss-of-resistance technique" with 2-3 ml of PF-NaCl (0.9% NSS), in a 5cc LOR glass syringe. Proper needle placement secured. Negative aspiration confirmed. Solution injected in intermittent fashion, asking for systemic symptoms every 0.5cc of injectate. The needles were then removed and the area cleansed, making sure to leave some of the prepping solution back to take advantage of its long term bactericidal properties.  Vitals:   03/10/19 1430 03/10/19 1439 03/10/19 1448 03/10/19 1457  BP: (!) 87/51 97/61 (!) 84/60 103/69  Pulse: 89     Resp: 16 20 18 19   Temp:  98.4 F (36.9 C)    SpO2: 99% 100% 100% 100%  Weight:      Height:        End Time: 1427 hrs.  Materials:  Needle(s) Type: Epidural needle Gauge: 17G Length: 3.5-in Medication(s): Please see orders for medications and dosing details.  Imaging Guidance (Spinal):          Type of Imaging Technique: Fluoroscopy Guidance (Spinal) Indication(s): Assistance in needle guidance and placement for procedures requiring needle placement in or near specific anatomical locations not easily accessible without such assistance. Exposure Time: Please see nurses notes. Contrast: Before injecting any contrast, we confirmed that the patient did not have an allergy to iodine, shellfish, or radiological contrast. Once satisfactory needle placement was completed at the desired level, radiological contrast was injected. Contrast injected under live fluoroscopy. No contrast complications. See chart for type and  volume of contrast used. Fluoroscopic Guidance: I was personally present during the use of fluoroscopy. "Tunnel Vision Technique" used to obtain the best possible view of the target area. Parallax error corrected before commencing the procedure. "Direction-depth-direction" technique used to introduce the needle under continuous pulsed fluoroscopy. Once target was reached, antero-posterior, oblique, and lateral fluoroscopic projection used confirm needle placement in  all planes. Images permanently stored in EMR. Interpretation: I personally interpreted the imaging intraoperatively. Adequate needle placement confirmed in multiple planes. Appropriate spread of contrast into desired area was observed. No evidence of afferent or efferent intravascular uptake. No intrathecal or subarachnoid spread observed. Permanent images saved into the patient's record.  Antibiotic Prophylaxis:   Anti-infectives (From admission, onward)   None     Indication(s): None identified  Post-operative Assessment:  Post-procedure Vital Signs:  Pulse/HCG Rate: 8981 Temp: 98.4 F (36.9 C) Resp: 19 BP: 103/69 SpO2: 100 %  EBL: None  Complications: No immediate post-treatment complications observed by team, or reported by patient.  Note: The patient tolerated the entire procedure well. A repeat set of vitals were taken after the procedure and the patient was kept under observation following institutional policy, for this type of procedure. Post-procedural neurological assessment was performed, showing return to baseline, prior to discharge. The patient was provided with post-procedure discharge instructions, including a section on how to identify potential problems. Should any problems arise concerning this procedure, the patient was given instructions to immediately contact us, at any time, without hesitation. In any case, we plan to contact the patient by telephone for a follow-up status report regarding this  interventional procedure.  Comments:  No additional relevant information.  Plan of Care  Orders:  Orders Placed This Encounter  Procedures  . Lumbar Transforaminal Epidural    Scheduling Instructions:     Side: Left-sided     Level: L3     Sedation: With Sedation.     Timeframe: Today    Order Specific Question:   Where will this procedure be performed?    Answer:   [facility]  . Lumbar Epidural Injection    Scheduling Instructions:     Procedure: Interlaminar LESI L2-3     Laterality: Right-sided     Sedation: Patient's choice     Timeframe:  Today    Order Specific Question:   Where will this procedure be performed?    Answer:   [facility]  . DG PAIN CLINIC C-ARM 1-60 MIN NO REPORT    Intraoperative interpretation by procedural physician at [facility].    Standing Status:   Standing    Number of Occurrences:   1    Order Specific Question:   Reason for exam:    Answer:   Assistance in needle guidance and placement for procedures requiring needle placement in or near specific anatomical locations not easily accessible without such assistance.  . Informed Consent Details: Transcribe to consent form and obtain patient signature    Surgeon: Shauntavia Brackin A. Dossie Arbour, MD    Scheduling Instructions:     Procedure: Diagnostic lumbar transforaminal epidural steroid injection under fluoroscopic guidance. 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 Informed Consent Details: Transcribe to consent form and obtain patient signature    Surgeon: Dorlene Footman A. Dossie Arbour, MD    Scheduling Instructions:     Procedure: Lumbar epidural steroid injection under fluoroscopic guidance     Indications: Low back and/or lower extremity pain secondary to lumbar radiculitis  . Provider attestation of informed consent for procedure/surgical case    I, the ordering provider, attest that I have discussed  with the patient the benefits, risks, side effects, alternatives, likelihood of achieving goals and potential problems during recovery for the procedure that I have provided informed consent.    Standing Status:   Standing  Number of Occurrences:   1   Medications ordered for procedure: Meds ordered this encounter  Medications  . iohexol (OMNIPAQUE) 180 MG/ML injection 10 mL    Must be Myelogram-compatible. If not available, you may substitute with a water-soluble, non-ionic, hypoallergenic, myelogram-compatible radiological contrast medium.  . sodium chloride flush (NS) 0.9 % injection 2 mL  . ropivacaine (PF) 2 mg/mL (0.2%) (NAROPIN) injection 2 mL  . triamcinolone acetonide (KENALOG-40) injection 40 mg  . sodium chloride flush (NS) 0.9 % injection 1 mL  . ropivacaine (PF) 2 mg/mL (0.2%) (NAROPIN) injection 1 mL  . dexamethasone (DECADRON) injection 10 mg  . lactated ringers infusion 1,000 mL  . midazolam (VERSED) 5 MG/5ML injection 1-2 mg    Make sure Flumazenil is available in the pyxis when using this medication. If oversedation occurs, administer 0.2 mg IV over 15 sec. If after 45 sec no response, administer 0.2 mg again over 1 min; may repeat at 1 min intervals; not to exceed 4 doses (1 mg)  . fentaNYL (SUBLIMAZE) injection 25-50 mcg    Make sure Narcan is available in the pyxis when using this medication. In the event of respiratory depression (RR< 8/min): Titrate NARCAN (naloxone) in increments of 0.1 to 0.2 mg IV at 2-3 minute intervals, until desired degree of reversal.  . lidocaine (XYLOCAINE) 2 % (with pres) injection 400 mg   Medications administered: We administered iohexol, sodium chloride flush, ropivacaine (PF) 2 mg/mL (0.2%), triamcinolone acetonide, sodium chloride flush, ropivacaine (PF) 2 mg/mL (0.2%), dexamethasone, midazolam, fentaNYL, and lidocaine.  See the medical record for exact dosing, route, and time of administration.  Disposition: Discharge home   Discharge Date & Time: 03/10/2019; 1500 hrs.   Follow-up plan:   Return in about 2 weeks (around 03/24/2019) for Evaluation, (Post-proc), (Virtual Visit).     Future Appointments  Date Time Provider [place]  03/21/2019  1:30 PM Milinda Pointer, MD [facility] None  04/19/2019  9:45 AM Edrick Kins, DPM [facility]  07/25/2019  1:30 PM [facility]   Primary Care Physician: Remi Haggard, FNP Location: [facility] Note by: Gaspar Cola, MD Date: 03/10/2019; Time: 3:32 PM  Disclaimer:  Medicine is not an Chief Strategy Officer. The only guarantee in medicine is that nothing is guaranteed. It is important to note that the decision to proceed with this intervention was based on the information collected from the patient. The Data and conclusions were drawn from the patient's questionnaire, the interview, and the physical examination. Because the information was provided in large part by the patient, it cannot be guaranteed that it has not been purposely or unconsciously manipulated. Every effort has been made to obtain as much relevant data as possible for this evaluation. It is important to note that the conclusions that lead to this procedure are derived in large part from the available data. Always take into account that the treatment will also be dependent on availability of resources and existing treatment guidelines, considered by other Pain Management Practitioners as being common knowledge and practice, at the time of the intervention. For Medico-Legal purposes, it is also important to point out that variation in procedural techniques and pharmacological choices are the acceptable norm. The indications, contraindications, technique, and results of the above procedure should only be interpreted and judged by a Board-Certified Interventional Pain Specialist with extensive familiarity and expertise in the same exact procedure and  technique.

## 2019-03-10 NOTE — Progress Notes (Signed)
Safety precautions to be maintained throughout the outpatient stay will include: orient to surroundings, keep bed in low position, maintain call bell within reach at all times, provide assistance with transfer out of bed and ambulation.  

## 2019-03-10 NOTE — Addendum Note (Signed)
Addended by: Milinda Pointer A on: 03/10/2019 03:32 PM   Modules accepted: Orders

## 2019-03-10 NOTE — Patient Instructions (Addendum)

## 2019-03-11 ENCOUNTER — Telehealth: Payer: Self-pay | Admitting: *Deleted

## 2019-03-11 NOTE — Telephone Encounter (Signed)
NO problems post procedure.

## 2019-03-17 ENCOUNTER — Encounter: Payer: Self-pay | Admitting: Pain Medicine

## 2019-03-21 ENCOUNTER — Ambulatory Visit: Payer: Medicare Other | Attending: Pain Medicine | Admitting: Pain Medicine

## 2019-03-21 ENCOUNTER — Other Ambulatory Visit: Payer: Self-pay

## 2019-03-21 DIAGNOSIS — M5136 Other intervertebral disc degeneration, lumbar region: Secondary | ICD-10-CM | POA: Diagnosis not present

## 2019-03-21 DIAGNOSIS — G8929 Other chronic pain: Secondary | ICD-10-CM

## 2019-03-21 DIAGNOSIS — M79605 Pain in left leg: Secondary | ICD-10-CM

## 2019-03-21 DIAGNOSIS — G894 Chronic pain syndrome: Secondary | ICD-10-CM

## 2019-03-21 DIAGNOSIS — M5442 Lumbago with sciatica, left side: Secondary | ICD-10-CM | POA: Diagnosis not present

## 2019-03-21 DIAGNOSIS — M961 Postlaminectomy syndrome, not elsewhere classified: Secondary | ICD-10-CM | POA: Diagnosis not present

## 2019-03-21 NOTE — Progress Notes (Signed)
Pain Management Virtual Encounter Note - Virtual Visit via Telephone Telehealth (real-time audio visits between healthcare provider and patient).   Patient's Phone No. & Preferred Pharmacy:  [phone] (home); [phone] (mobile); (Preferred) [phone] No e-mail address on record  [place], [place] [address] [address] Phone: [phone] Fax: [phone]  [id number] - [place], [place] [address] [name] [address] Phone: [phone] Fax: [phone]    Pre-screening note:  Our staff contacted Lynn Potter and offered her an "in person", "face-to-face" appointment versus a telephone encounter. She indicated preferring the telephone encounter, at this time.   Reason for Virtual Visit: COVID-19*  Social distancing based on CDC and AMA recommendations.   I contacted Lynn Potter on 03/21/2019 via telephone.      I clearly identified myself as Gaspar Cola, MD. I verified that I was speaking with the correct person using two identifiers (Name: Lynn Potter, and date of birth: 1946-09-18).  Advanced Informed Consent I sought verbal advanced consent from Lynn Potter for virtual visit interactions. I informed Lynn Potter of possible security and privacy concerns, risks, and limitations associated with providing "not-in-person" medical evaluation and management services. I also informed Lynn Potter of the availability of "in-person" appointments. Finally, I informed her that there would be a charge for the virtual visit and that she could be  personally, fully or partially, financially responsible for it. Lynn Potter expressed understanding and agreed to proceed.   Historic Elements   Lynn Potter is a 73 y.o. year old, female patient evaluated today after her last encounter by our practice on 03/11/2019. Lynn Potter  has a past medical history of AF (atrial fibrillation) ([place]), Arthritis,  Asthma, Chicken pox, Diabetes mellitus without complication ([place]), Glaucoma, Hemorrhoids, History of hiatal hernia, Hypertension, Migraines, Obesity, Opiate use (03/16/2017), Personal history of tobacco use, presenting hazards to health (06/18/2015), and Sleep apnea. She also  has a past surgical history that includes Joint replacement; Total knee arthroplasty; Abdominal hysterectomy; Cholecystectomy; Colonoscopy with esophagogastroduodenoscopy (egd); carpal tunnell; Eye surgery; Cataract extraction; Back surgery; Lumbar fusion; Upper esophageal endoscopic ultrasound (eus) (N/A, 01/03/2016); and Esophagogastroduodenoscopy (egd) with propofol (N/A, 02/16/2017). Lynn Potter has a current medication list which includes the following prescription(s): accu-chek aviva plus, accu-chek softclix lancets, amlodipine, vitamin c, aspirin, atorvastatin, azelastine, baclofen, accu-chek aviva plus, chantix, cholecalciferol, combivent respimat, ferrous sulfate, fluconazole, hydroxyzine, insulin aspart, insulin lispro, jardiance, klor-con m10, latanoprost, levemir flextouch, metformin, methocarbamol, olmesartan-hydrochlorothiazide, pantoprazole, pregabalin, and ropinirole. She  reports that she has been smoking cigarettes. She has been smoking about 1.00 pack per day. She has never used smokeless tobacco. She reports that she does not drink alcohol or use drugs. Lynn Potter has No Known Allergies.   HPI  Today, she is being contacted for a post-procedure assessment.  Post-Procedure Evaluation  Procedure: (L) L3 TFESI #2 + (R) L2-3 LESI #2. Pre-procedure pain level:  5/10 Post-procedure: 0/10 (100% relief)  Sedation: Sedation provided.  Effectiveness during initial hour after procedure(Ultra-Short Term Relief):   100%  Local anesthetic used: Long-acting (4-6 hours) Effectiveness: Defined as any analgesic benefit obtained secondary to the administration of local anesthetics. This carries significant diagnostic value as to the  etiological location, or anatomical origin, of the pain. Duration of benefit is expected to coincide with the duration of the local anesthetic used.  Effectiveness during initial 4-6 hours after procedure(Short-Term Relief):   100%  Long-term benefit: Defined as any relief past the pharmacologic duration of the local anesthetics.  Effectiveness past the initial 6 hours after procedure(Long-Term Relief):   100% x 6 days  Current benefits: Defined as benefit that persist at this time.   Analgesia:  Back to baseline Function: Somewhat improved ROM: Somewhat improved  Pertinent Labs   SAFETY SCREENING Profile Lab Results  Component Value Date   SARSCOV2NAA NOT DETECTED 03/07/2019   COVIDSOURCE NASOPHARYNGEAL 03/07/2019   MRSAPCR NEGATIVE 07/28/2017   Renal Function Lab Results  Component Value Date   BUN 9 11/09/2018   CREATININE 0.86 11/09/2018   BCR 10 (L) 11/09/2018   GFRAA 77 11/09/2018   GFRNONAA 67 11/09/2018   Hepatic Function Lab Results  Component Value Date   AST 11 11/09/2018   ALT 152 (H) 05/24/2018   ALBUMIN 3.9 11/09/2018   UDS Summary  Date Value Ref Range Status  11/09/2018 FINAL  Final    Comment:    ==================================================================== TOXASSURE COMP DRUG ANALYSIS,UR ==================================================================== Test                             Result       Flag       Units Drug Present and Declared for Prescription Verification   Pregabalin                     PRESENT      EXPECTED   Baclofen                       PRESENT      EXPECTED   Methocarbamol                  PRESENT      EXPECTED   Hydroxyzine                    PRESENT      EXPECTED Drug Absent but Declared for Prescription Verification   Salicylate                     Not Detected UNEXPECTED    Aspirin, as indicated in the declared medication list, is not    always detected even when used as  directed. ==================================================================== Test                      Result    Flag   Units      Ref Range   Creatinine              58               mg/dL      >=20 ==================================================================== Declared Medications:  The flagging and interpretation on this report are based on the  following declared medications.  Unexpected results may arise from  inaccuracies in the declared medications.  **Note: The testing scope of this panel includes these medications:  Baclofen  Hydroxyzine  Methocarbamol  Pregabalin (Lyrica)  **Note: The testing scope of this panel does not include small to  moderate amounts of these reported medications:  Aspirin (Aspirin 81)  **Note: The testing scope of this panel does not include following  reported medications:  Amlodipine  Ascorbic Acid  Atorvastatin  Azelastine  Benazepril  Cholecalciferol  Empagliflozin (Jardiance)  Fluconazole (Diflucan)  Insulin  Iron (Ferrous Sulfate)  Latanoprost  Metformin  Olmesartan (Benicar)  Pantoprazole  Ropinirole (Requip) ==================================================================== For clinical consultation, please call [phone]. ====================================================================    Note: Above Lab results reviewed.  Recent imaging  DG C-Arm 1-60 Min-No Report Fluoroscopy was utilized by the requesting physician.  No radiographic  interpretation.   Assessment  The primary encounter diagnosis was DDD (degenerative disc disease), lumbar. Diagnoses of Chronic low back pain (Primary area of Pain) (Bilateral) (L>R) w/ sciatica (Left), Chronic lower extremity pain (Secondary Area of Pain) (Left), Failed back surgical syndrome, and Chronic pain syndrome were also pertinent to this visit.  Plan of Care  I am having Lynn Potter maintain her aspirin, ferrous sulfate, latanoprost, pregabalin, vitamin C,  pantoprazole, metFORMIN, insulin aspart, azelastine, Cholecalciferol, Jardiance, hydrOXYzine, Levemir FlexTouch, olmesartan-hydrochlorothiazide, atorvastatin, amLODipine, fluconazole, Accu-Chek Aviva Plus, baclofen, insulin lispro, Accu-Chek Aviva Plus, Accu-Chek Softclix Lancets, methocarbamol, Combivent Respimat, Klor-Con M10, Chantix, and rOPINIRole.  Pharmacotherapy (Medications Ordered): No orders of the defined types were placed in this encounter.  Orders:  Orders Placed This Encounter  Procedures  . Lumbar Epidural Injection    Standing Status:   Future    Standing Expiration Date:   04/20/2019    Scheduling Instructions:     Procedure: Interlaminar Lumbar Epidural Steroid injection (LESI)  L2-3     Laterality: Right-sided     Sedation: Patient's choice.     Timeframe: ASAA    Order Specific Question:   Where will this procedure be performed?    Answer:   [facility]  . Lumbar Transforaminal Epidural    Standing Status:   Future    Standing Expiration Date:   04/20/2019    Scheduling Instructions:     Side: Left-sided     Level: L3     Sedation: With Sedation.     Timeframe: ASAA    Order Specific Question:   Where will this procedure be performed?    Answer:   [facility]   Follow-up plan:   Return for Procedure (w/ sedation): (L) L3 TFESI #3 + (R) L2-3 LESI #3., (ASAP).    I discussed the assessment and treatment plan with the patient. The patient was provided an opportunity to ask questions and all were answered. The patient agreed with the plan and demonstrated an understanding of the instructions.  Patient advised to call back or seek an in-person evaluation if the symptoms or condition worsens.  Total duration of non-face-to-face encounter: 10 minutes.  Note by: Gaspar Cola, MD Date: 03/21/2019; Time: 1:32 PM  Note: This dictation was prepared with Dragon dictation. Any transcriptional errors that may result from this process are  unintentional.  Disclaimer:  * Given the special circumstances of the COVID-19 pandemic, the federal government has announced that the Office for Civil Rights (OCR) will exercise its enforcement discretion and will not impose penalties on physicians using telehealth in the event of noncompliance with regulatory requirements under the [place] and [place] (HIPAA) in connection with the good faith provision of telehealth during the BWLSL-37 national public health emergency. ([place])

## 2019-03-23 ENCOUNTER — Other Ambulatory Visit: Admission: RE | Admit: 2019-03-23 | Payer: Medicare Other | Source: Ambulatory Visit

## 2019-03-25 ENCOUNTER — Other Ambulatory Visit
Admission: RE | Admit: 2019-03-25 | Discharge: 2019-03-25 | Disposition: A | Discharge status: Home / Self Care | Payer: Medicare Other | Source: Ambulatory Visit | Attending: Pain Medicine | Admitting: Pain Medicine

## 2019-03-25 ENCOUNTER — Other Ambulatory Visit: Payer: Self-pay

## 2019-03-25 DIAGNOSIS — Z1159 Encounter for screening for other viral diseases: Secondary | ICD-10-CM | POA: Diagnosis present

## 2019-03-26 ENCOUNTER — Other Ambulatory Visit: Payer: Self-pay

## 2019-03-26 ENCOUNTER — Encounter: Payer: Self-pay | Admitting: Emergency Medicine

## 2019-03-26 DIAGNOSIS — R531 Weakness: Secondary | ICD-10-CM | POA: Insufficient documentation

## 2019-03-26 DIAGNOSIS — I10 Essential (primary) hypertension: Secondary | ICD-10-CM | POA: Insufficient documentation

## 2019-03-26 DIAGNOSIS — F17210 Nicotine dependence, cigarettes, uncomplicated: Secondary | ICD-10-CM | POA: Insufficient documentation

## 2019-03-26 DIAGNOSIS — E119 Type 2 diabetes mellitus without complications: Secondary | ICD-10-CM | POA: Insufficient documentation

## 2019-03-26 DIAGNOSIS — Z79899 Other long term (current) drug therapy: Secondary | ICD-10-CM | POA: Diagnosis not present

## 2019-03-26 DIAGNOSIS — I4891 Unspecified atrial fibrillation: Secondary | ICD-10-CM | POA: Diagnosis not present

## 2019-03-26 DIAGNOSIS — R251 Tremor, unspecified: Secondary | ICD-10-CM | POA: Insufficient documentation

## 2019-03-26 DIAGNOSIS — Z7982 Long term (current) use of aspirin: Secondary | ICD-10-CM | POA: Diagnosis not present

## 2019-03-26 DIAGNOSIS — Z794 Long term (current) use of insulin: Secondary | ICD-10-CM | POA: Insufficient documentation

## 2019-03-26 DIAGNOSIS — J45909 Unspecified asthma, uncomplicated: Secondary | ICD-10-CM | POA: Insufficient documentation

## 2019-03-26 LAB — CBC
HCT: 31.7 % — ABNORMAL LOW (ref 36.0–46.0)
Hemoglobin: 9.3 g/dL — ABNORMAL LOW (ref 12.0–15.0)
MCH: 20.6 pg — ABNORMAL LOW (ref 26.0–34.0)
MCHC: 29.3 g/dL — ABNORMAL LOW (ref 30.0–36.0)
MCV: 70.1 fL — ABNORMAL LOW (ref 80.0–100.0)
Platelets: 331 10*3/uL (ref 150–400)
RBC: 4.52 MIL/uL (ref 3.87–5.11)
RDW: 19.2 % — ABNORMAL HIGH (ref 11.5–15.5)
WBC: 13.1 10*3/uL — ABNORMAL HIGH (ref 4.0–10.5)
nRBC: 0 % (ref 0.0–0.2)

## 2019-03-26 LAB — URINALYSIS, COMPLETE (UACMP) WITH MICROSCOPIC
Bacteria, UA: NONE SEEN
Bilirubin Urine: NEGATIVE
Glucose, UA: >=500 mg/dL — AB
Hgb urine dipstick: NEGATIVE
Ketones, ur: NEGATIVE mg/dL
Leukocytes,Ua: NEGATIVE
Nitrite: NEGATIVE
Protein, ur: NEGATIVE mg/dL
Specific Gravity, Urine: 1.01 (ref 1.005–1.030)
pH: 6 (ref 5.0–8.0)

## 2019-03-26 LAB — BASIC METABOLIC PANEL
Anion gap: 10 (ref 5–15)
BUN: 20 mg/dL (ref 8–23)
CO2: 23 mmol/L (ref 22–32)
Calcium: 8.8 mg/dL — ABNORMAL LOW (ref 8.9–10.3)
Chloride: 97 mmol/L — ABNORMAL LOW (ref 98–111)
Creatinine, Ser: 0.81 mg/dL (ref 0.44–1.00)
GFR calc Af Amer: >60 mL/min (ref >60)
GFR calc non Af Amer: >60 mL/min (ref >60)
Glucose, Bld: 144 mg/dL — ABNORMAL HIGH (ref 70–99)
Potassium: 3.5 mmol/L (ref 3.5–5.1)
Sodium: 130 mmol/L — ABNORMAL LOW (ref 135–145)

## 2019-03-26 LAB — GLUCOSE, CAPILLARY: Glucose-Capillary: 144 mg/dL — ABNORMAL HIGH (ref 70–99)

## 2019-03-26 LAB — NOVEL CORONAVIRUS, NAA (HOSP ORDER, SEND-OUT TO REF LAB; TAT 18-24 HRS): SARS-CoV-2, NAA: NOT DETECTED

## 2019-03-26 NOTE — ED Notes (Signed)
Patient to waiting room via wheelchair by EMS.  Per EMS patient complains of shakes, weakness for several days.  Hx of stroke several years ago.  CBG 200, pulse oxi 100% on room air, pulse 80's, bp 123/75.

## 2019-03-26 NOTE — ED Triage Notes (Signed)
Pt arrives via [facility] with c/o of feeling weak since yesterday. Pt also states HA. Pt appears in NAD.

## 2019-03-27 ENCOUNTER — Emergency Department
Admission: EM | Admit: 2019-03-27 | Discharge: 2019-03-27 | Disposition: A | Discharge status: Home / Self Care | Payer: Medicare Other | Attending: Emergency Medicine | Admitting: Emergency Medicine

## 2019-03-27 ENCOUNTER — Emergency Department: Payer: Medicare Other

## 2019-03-27 DIAGNOSIS — R531 Weakness: Secondary | ICD-10-CM | POA: Diagnosis not present

## 2019-03-27 DIAGNOSIS — R251 Tremor, unspecified: Secondary | ICD-10-CM

## 2019-03-27 MED ORDER — SODIUM CHLORIDE 0.9 % IV BOLUS
500.0000 mL | Freq: Once | INTRAVENOUS | Status: AC
  Administered 2019-03-27: 02:00:00 500 mL via INTRAVENOUS

## 2019-03-27 NOTE — ED Notes (Signed)
Patient transported to CT 

## 2019-03-27 NOTE — ED Provider Notes (Signed)
[facility] Emergency Department Provider Note  ____________________________________________   First MD Initiated Contact with Patient 03/27/19 0114     (approximate)  I have reviewed the triage vital signs and the nursing notes.   HISTORY  Chief Complaint Weakness  History is somewhat limited by the patient being a bit vague with her responses.  HPI Lynn Potter is a 73 y.o. female with medical history as listed below which notably includes diabetes which has been uncontrolled with what she reports is a blood sugar of 1200 in the past as well as a history of chronic pain and who goes to the pain clinic to see Dr. Consuela Mimes.  She was recently tested at the pain clinic less than 48 hours ago for coronavirus and the test came back negative.  She presents tonight with generalized weakness.  She says that she gets the shakes sometimes when her blood sugar is elevated and it has been worse recently.  She says it comes and goes for no reason and nothing in particular makes it better or worse.  Her blood sugar at home was testing around 220 which was reassuring but she felt like she should get it checked out since she was having some shaking that was similar to what she had when her blood sugar was more than 1000.  She reports that she has had a sore throat for quite a while and that her primary care doctor did a throat test which showed she did not have an infection.  She is not having any trouble swallowing.  She is not having trouble speaking.  She has an appointment in 2 days with her pain clinic for an injection for chronic back pain.  She says that she is not having any cough or respiratory problems.  She denies chest pain.  She denies abdominal pain, nausea, vomiting, and dysuria.  She says that the weakness is generalized and not specific to any extremity.  She also has a generalized headache but says this is also frequently the case.  She claims to have been eating and  drinking well recently but says that she goes to the bathroom a lot to urinate.  She describes the symptoms as moderate.        Past Medical History:  Diagnosis Date   AF (atrial fibrillation) (HCC)    Arthritis    Asthma    Chicken pox    Diabetes mellitus without complication ([place])    Glaucoma    Hemorrhoids    History of hiatal hernia    Hypertension    Migraines    Obesity    Opiate use 03/16/2017   Personal history of tobacco use, presenting hazards to health 06/18/2015   Sleep apnea     Patient Active Problem List   Diagnosis Date Noted   Preop testing 03/03/2019   Abnormal MRI, lumbar spine (09/26/18) 12/15/2018   Spondylosis without myelopathy or radiculopathy, lumbosacral region 12/02/2018   Chronic low back pain (Primary area of Pain) (Bilateral) (L>R) w/o sciatica 12/02/2018   Abnormal CT of  lumbar spine (10/11/2018) 12/02/2018   Lumbar facet hypertrophy (Multilevel) 12/02/2018   DDD (degenerative disc disease), lumbar 12/02/2018   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), w/ neurogenic claudication 12/02/2018   Osteoarthritis of facet joint of lumbar spine 12/02/2018   Osteoarthritis of lumbar spine 12/02/2018   Lumbar spondylosis 12/02/2018   Chronic hip pain (Left) 11/29/2018   Osteoarthritis of hip (  Left) 11/29/2018   Lumbar facet syndrome (Bilateral) (L>R) 11/29/2018   Vitamin B12 deficiency 11/29/2018   Elevated C-reactive protein (CRP) 11/15/2018   Elevated sed rate 11/15/2018   Chronic low back pain (Primary area of Pain) (Bilateral) (L>R) w/ sciatica (Left) 11/09/2018   Chronic lower extremity pain (Secondary Area of Pain) (Left) 11/09/2018   Chronic shoulder pain (Tertiary Area of Pain) (Bilateral) (R>L) 11/09/2018   Pharmacologic therapy 11/09/2018   Disorder of skeletal system 11/09/2018   Problems influencing health status 11/09/2018   Acute diverticulitis  05/23/2018   Hyperglycemia 07/28/2017   Vitamin D insufficiency 03/25/2017   Chronic pain syndrome 03/16/2017   Encounter for long-term opiate analgesic use 03/16/2017   Long term current use of opiate analgesic 03/16/2017   Opiate use 03/16/2017   Degenerative joint disease (DJD) of hip 03/16/2017   Sacroiliac joint pain 03/16/2017   Sepsis ([place]) 09/30/2016   Osteoarthritis of shoulder (Right) 10/17/2015   Personal history of tobacco use, presenting hazards to health 06/18/2015   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   Rotator cuff tendinitis 03/10/2014   History of rotator cuff surgery (Bilateral) 03/10/2014    Past Surgical History:  Procedure Laterality Date   ABDOMINAL HYSTERECTOMY     BACK SURGERY     carpal tunnell     CATARACT EXTRACTION     CHOLECYSTECTOMY     COLONOSCOPY WITH ESOPHAGOGASTRODUODENOSCOPY (EGD)     ESOPHAGOGASTRODUODENOSCOPY (EGD) WITH PROPOFOL N/A 02/16/2017   Procedure: ESOPHAGOGASTRODUODENOSCOPY (EGD) WITH PROPOFOL;  Surgeon: Manya Silvas, MD;  Location: [facility] ENDOSCOPY;  Service: Endoscopy;  Laterality: N/A;   EYE SURGERY     JOINT REPLACEMENT     LUMBAR FUSION     TOTAL KNEE ARTHROPLASTY     UPPER ESOPHAGEAL ENDOSCOPIC ULTRASOUND (EUS) N/A 01/03/2016   Procedure: UPPER ESOPHAGEAL ENDOSCOPIC ULTRASOUND (EUS);  Surgeon: Holly Bodily, MD;  Location: [facility] ENDOSCOPY;  Service: Gastroenterology;  Laterality: N/A;    Prior to Admission medications   Medication Sig Start Date End Date Taking? Authorizing Provider  ACCU-CHEK AVIVA PLUS test strip  07/07/18   [provider]  ACCU-CHEK SOFTCLIX LANCETS lancets 2 (two) times daily. 07/16/18   [provider]  amLODipine (NORVASC) 10 MG tablet Take 10 mg by mouth daily. 05/24/18   [provider]  Ascorbic Acid (VITAMIN C) 1000 MG tablet Take 1,000 mg by mouth daily.    [provider]  aspirin 81 MG tablet Take 81 mg by mouth  daily.    [provider]  atorvastatin (LIPITOR) 40 MG tablet Take 1 tablet (40 mg total) by mouth daily. 05/25/18 03/17/19  Max Sane, MD  azelastine (ASTELIN) 0.1 % nasal spray Place 1 spray into both nostrils 2 (two) times daily as needed for rhinitis or allergies.  03/12/18   [provider]  baclofen (LIORESAL) 10 MG tablet 10 mg 2 (two) times daily.  10/21/18   [provider]  Blood Glucose Monitoring Suppl (ACCU-CHEK AVIVA PLUS) w/Device KIT EVERY DAY 07/16/18   [provider]  CHANTIX 0.5 MG tablet  01/18/19   [provider]  Cholecalciferol (D-5000) 5000 units TABS Take 5,000 Units by mouth daily.     [provider]  COMBIVENT RESPIMAT 20-100 MCG/ACT AERS respimat USE 1 VIAL VIA NEBULIZER 4 TIMES A DAY AS NEEDED 12/13/18   [provider]  ferrous sulfate 325 (65 FE) MG EC tablet Take 325 mg by mouth 2 (two) times daily.     [provider]  fluconazole (DIFLUCAN) 150 MG tablet Take 150 mg by mouth daily. 06/03/18   [provider]  hydrOXYzine (VISTARIL) 25 MG capsule Take 25 mg by mouth every 12 (twelve) hours.  03/22/18   [provider]  insulin aspart (NOVOLOG) 100 UNIT/ML injection Inject 0-9 Units into the skin 3 (three) times daily with meals. 07/31/17   Epifanio Lesches, MD  insulin lispro (HUMALOG KWIKPEN) 100 UNIT/ML KwikPen Inject into the skin. 08/31/18   [provider]  JARDIANCE 25 MG TABS tablet Take 25 mg by mouth daily.  03/25/18   [provider]  KLOR-CON M10 10 MEQ tablet Take 10 mEq by mouth daily. 12/13/18   [provider]  latanoprost (XALATAN) 0.005 % ophthalmic solution Place 1 drop into both eyes at bedtime.     [provider]  LEVEMIR FLEXTOUCH 100 UNIT/ML Pen Inject 25 Units into the skin daily. Patient reports she only takes Levemir if her glucose is >150 mg/dl which is rare for her 03/18/18   [provider]  metFORMIN  (GLUCOPHAGE) 500 MG tablet Take 1 tablet (500 mg total) by mouth 2 (two) times daily with a meal. 07/31/17   Epifanio Lesches, MD  methocarbamol (ROBAXIN) 500 MG tablet Take 500 mg by mouth 3 (three) times daily.    [provider]  olmesartan-hydrochlorothiazide (BENICAR HCT) 40-25 MG tablet Take 1 tablet by mouth daily. 03/17/18   [provider]  pantoprazole (PROTONIX) 40 MG tablet Take 1 tablet by mouth 2 (two) times daily.     [provider]  pregabalin (LYRICA) 150 MG capsule Take 150 mg by mouth 2 (two) times daily.     [provider]  rOPINIRole (REQUIP) 1 MG tablet TAKE 1 TABLET BY MOUTH EVERYDAY AT BEDTIME 11/11/18   [provider]    Allergies Patient has no known allergies.  Family History  Problem Relation Age of Onset   Diabetes Mother    Diabetes Sister     Social History Social History   Tobacco Use   Smoking status: Current Every Day Smoker    Packs/day: 1.00    Types: Cigarettes   Smokeless tobacco: Never Used  Substance Use Topics   Alcohol use: No   Drug use: No    Review of Systems Constitutional: Generalized weakness.  No fever/chills Eyes: No visual changes. ENT: Subacute sore throat. Cardiovascular: Denies chest pain. Respiratory: Denies shortness of breath.  Denies cough. Gastrointestinal: No abdominal pain.  No nausea, no vomiting.  No diarrhea.  No constipation. Genitourinary: Negative for dysuria. Musculoskeletal: Negative for neck pain.  Negative for back pain. Integumentary: Negative for rash. Neurological: Generalized weakness.  Generalized headache.  Episodes of "having the shakes" .  No focal weakness or numbness.   ____________________________________________   PHYSICAL EXAM:  VITAL SIGNS: ED Triage Vitals  Enc Vitals Group     BP 03/26/19 2116 128/87     Pulse Rate 03/26/19 2116 70     Resp 03/26/19 2116 17     Temp 03/26/19 2116 98.8 F (37.1 C)     Temp Source 03/26/19  2116 Oral     SpO2 03/26/19 2116 96 %     Weight 03/26/19 2117 94.3 kg (208 lb)     Height 03/26/19 2117 1.626 m (5' 4" )     Head Circumference --      Peak Flow --      Pain Score 03/26/19 2116 5     Pain Loc --  Pain Edu? --      Excl. in [place]? --     Constitutional: Alert and oriented.  Generally well appearing and in no acute distress. Eyes: Conjunctivae are normal.  Head: Atraumatic. Nose: No congestion/rhinnorhea. Mouth/Throat: Mucous membranes are moist. Neck: No stridor.  No meningeal signs.   Cardiovascular: Normal rate, regular rhythm. Good peripheral circulation. Grossly normal heart sounds. Respiratory: Normal respiratory effort.  No retractions. No audible wheezing. Gastrointestinal: Soft and nontender. No distention.  Musculoskeletal: No lower extremity tenderness nor edema. No gross deformities of extremities. Neurologic:  Normal speech and language. No gross focal neurologic deficits are appreciated.  The patient was resting comfortably when I arrived and not having any tremor.  After I started talking to her she started having some tremor in both of her hands.  When I commented on it she said that it is the shaking that has been bothering her.  When I pointed out that it was not going on when I first entered the room, she said that it comes and goes, and then the tremor stopped.  She has normal grip strength and she has what appears to be decreased effort on muscle strength test, but when I encourage and coach her, she is able to provide normal resistance. Skin:  Skin is warm, dry and intact. No rash noted. Psychiatric: Mood and affect are normal. Speech and behavior are normal.  ____________________________________________   LABS (all labs ordered are listed, but only abnormal results are displayed)  Labs Reviewed  BASIC METABOLIC PANEL - Abnormal; Notable for the following components:      Result Value   Sodium 130 (*)    Chloride 97 (*)    Glucose, Bld 144  (*)    Calcium 8.8 (*)    All other components within normal limits  CBC - Abnormal; Notable for the following components:   WBC 13.1 (*)    Hemoglobin 9.3 (*)    HCT 31.7 (*)    MCV 70.1 (*)    MCH 20.6 (*)    MCHC 29.3 (*)    RDW 19.2 (*)    All other components within normal limits  URINALYSIS, COMPLETE (UACMP) WITH MICROSCOPIC - Abnormal; Notable for the following components:   Color, Urine STRAW (*)    APPearance CLEAR (*)    Glucose, UA >=500 (*)    All other components within normal limits  GLUCOSE, CAPILLARY - Abnormal; Notable for the following components:   Glucose-Capillary 144 (*)    All other components within normal limits  CBG MONITORING, ED   ____________________________________________  EKG  ED ECG REPORT I, Hinda Kehr, the attending physician, personally viewed and interpreted this ECG.  Date: 03/26/2019 EKG Time: 21:12 Rate: 70 Rhythm: normal sinus rhythm QRS Axis: normal Intervals: normal ST/T Wave abnormalities: Non-specific ST segment / T-wave changes, but no clear evidence of acute ischemia. Narrative Interpretation: no definitive evidence of acute ischemia; does not meet STEMI criteria.   ____________________________________________  RADIOLOGY   ED MD interpretation:  No acute abnormalities on head CT  Official radiology report(s): Ct Head Wo Contrast  Result Date: 03/27/2019 CLINICAL DATA:  73 y/o  F; weakness since yesterday and headache. EXAM: CT HEAD WITHOUT CONTRAST TECHNIQUE: Contiguous axial images were obtained from the base of the skull through the vertex without intravenous contrast. COMPARISON:  05/24/2018 MRI of the head. 05/23/2018 CT head. FINDINGS: Brain: No evidence of acute infarction, hemorrhage, hydrocephalus, extra-axial collection or mass lesion/mass effect. Stable nonspecific white matter hypodensities  compatible with chronic microvascular ischemic changes and stable volume loss of the brain. Vascular: Calcific  atherosclerosis of the internal carotid arteries. No hyperdense vessel. Skull: Normal. Negative for fracture or focal lesion. Sinuses/Orbits: No acute finding. Other: None. IMPRESSION: 1. No acute intracranial abnormality identified. 2. Stable chronic microvascular ischemic changes and volume loss of the brain. Electronically Signed   By: Kristine Garbe M.D.   On: 03/27/2019 02:47    ____________________________________________   PROCEDURES   Procedure(s) performed (including Critical Care):  Procedures   ____________________________________________   INITIAL IMPRESSION / MDM / [place] / ED COURSE  As part of my medical decision making, I reviewed the following data within the [place] notes reviewed and incorporated, Labs reviewed , EKG interpreted , Old chart reviewed, Notes from prior ED visits and [place] Controlled Substance Database      *Lynn Potter was evaluated in Emergency Department on 03/27/2019 for the symptoms described in the history of present illness. She was evaluated in the context of the global COVID-19 pandemic, which necessitated consideration that the patient might be at risk for infection with the SARS-CoV-2 virus that causes COVID-19. Institutional protocols and algorithms that pertain to the evaluation of patients at risk for COVID-19 are in a state of rapid change based on information released by regulatory bodies including the CDC and federal and state organizations. These policies and algorithms were followed during the patient's care in the ED.  Some ED evaluations and interventions may be delayed as a result of limited staffing during the pandemic.*  Differential diagnosis includes, but is not limited to, metabolic or electrolyte abnormality, acute infection such as urinary tract infection or viral illness, hyperglycemia or poorly controlled diabetes.  Intracranial hemorrhage, CVA.  The patient is having no signs or  symptoms of cardiac ischemia with a reassuring EKG.  Vital signs are stable and she is afebrile.  She has had a recent COVID-19 test dated less than 48 hours ago which was negative.  Her lab results are notable for some hyponatremia but still with a sodium of 130, not significantly hyponatremic to explain some generalized weakness or tremor.  Her tremor is inconsistent and I do not feel she is doing it on purpose but it seems possible that it is more present when she is worried or thinking about it and when she is just relaxed and resting.  I do not feel it represents an emergent neurological issue but I am getting a noncontrast head CT to rule out any obvious cause such as an ischemic stroke (very unlikely) or an acute intracranial bleed which may be more likely given her report of a headache as well.  However I think she is likely mildly dehydrated leading to the hyponatremia and the headache.  She has a bit of a leukocytosis of 13.1 but without any other infectious symptoms.  No indication for chest x-ray at this time and she is breathing comfortably with no respiratory issues.  I am giving her a 500 mL fluid bolus and encouraging p.o. intake.  Head CT is pending and if this is within normal limits anticipate discharge with close outpatient follow-up after the fluid bolus.  She understands and agrees with the plan.    Clinical Course as of Mar 26 306  Sun Mar 27, 2019  0252 No acute abnormalities identified on noncontrast head CT.  The patient has been stable and comfortable.  I will discharge as previously described.  CT Head Wo  Contrast [CF]    Clinical Course User Index [CF] Hinda Kehr, MD     ____________________________________________  FINAL CLINICAL IMPRESSION(S) / ED DIAGNOSES  Final diagnoses:  Generalized weakness  Tremor     MEDICATIONS GIVEN DURING THIS VISIT:  Medications  sodium chloride 0.9 % bolus 500 mL (500 mLs Intravenous New Bag/Given 03/27/19 0213)     ED  Discharge Orders    None       Note:  This document was prepared using Dragon voice recognition software and may include unintentional dictation errors.   Hinda Kehr, MD 03/27/19 [phone]

## 2019-03-27 NOTE — ED Notes (Signed)
Patient resting quietly with eyes closed in no acute distress.  

## 2019-03-27 NOTE — Discharge Instructions (Addendum)
Your workup in the Emergency Department today was reassuring.  We did not find any specific abnormalities.  We recommend you drink plenty of fluids, take your regular medications and/or any new ones prescribed today, and follow up with the doctor(s) listed in these documents as recommended.  Return to the Emergency Department if you develop new or worsening symptoms that concern you.  

## 2019-03-28 NOTE — Progress Notes (Signed)
Patient's Name: Lynn Potter  MRN: [id number]  Referring Provider: Remi Haggard, FNP  DOB: 11/17/1945  PCP: Remi Haggard, FNP  DOS: 03/29/2019  Note by: Gaspar Cola, MD  Service setting: Ambulatory outpatient  Specialty: Interventional Pain Management  Patient type: Established  Location: [facility] (AMB) Pain Management Facility  Visit type: Interventional Procedure   Primary Reason for Visit: Interventional Pain Management Treatment. CC: Back Pain  Procedure #1:  Anesthesia, Analgesia, Anxiolysis:  Type: Therapeutic Trans-Foraminal Epidural Steroid Injection   #3  Region: Lumbar Level: L3 Paravertebral Laterality: Left-Sided Paravertebral   Type: Moderate (Conscious) Sedation combined with Local Anesthesia Indication(s): Analgesia and Anxiety Route: Intravenous (IV) IV Access: Secured Sedation: Meaningful verbal contact was maintained at all times during the procedure  Local Anesthetic: Lidocaine 1-2%  Position: Prone  Procedure #2:    Type: Therapeutic Inter-Laminar Epidural Steroid Injection   #3  Region: Lumbar Level: L2-3 Level. Laterality: Right-Sided Paramedial     Indications: 1. DDD (degenerative disc disease), lumbar   2. Chronic low back pain (Primary area of Pain) (Bilateral) (L>R) w/ sciatica (Left)   3. Chronic lower extremity pain (Secondary Area of Pain) (Left)   4. Failed back surgical syndrome   5. 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 (year old), female patient, seen today for interventional treatment. She  has a past surgical history that includes Joint replacement; Total knee arthroplasty; Abdominal hysterectomy; Cholecystectomy; Colonoscopy with esophagogastroduodenoscopy (egd); carpal tunnell; Eye surgery; Cataract extraction; Back surgery; Lumbar fusion; Upper esophageal endoscopic ultrasound  (eus) (N/A, 01/03/2016); and Esophagogastroduodenoscopy (egd) with propofol (N/A, 02/16/2017). Lynn Potter has a current medication list which includes the following prescription(s): accu-chek aviva plus, accu-chek softclix lancets, amlodipine, vitamin c, aspirin, azelastine, baclofen, accu-chek aviva plus, cholecalciferol, combivent respimat, ferrous sulfate, fluconazole, hydroxyzine, insulin aspart, insulin lispro, jardiance, klor-con m10, latanoprost, levemir flextouch, metformin, methocarbamol, olmesartan-hydrochlorothiazide, pantoprazole, pregabalin, ropinirole, atorvastatin, and chantix, and the following Facility-Administered Medications: fentanyl and midazolam. Her primarily concern today is the Back Pain  Initial Vital Signs:  Pulse/HCG Rate: 70ECG Heart Rate: 77 Temp: 98.5 F (36.9 C) Resp: 17 BP: (!) 102/59 SpO2: 100 %  BMI: Estimated body mass index is 35.7 kg/m as calculated from the following:   Height as of this encounter: 5\' 4"  (1.626 m).   Weight as of this encounter: 208 lb (94.3 kg).  Risk Assessment: Allergies: Reviewed. She has No Known Allergies.  Allergy Precautions: None required Coagulopathies: Reviewed. None identified.  Blood-thinner therapy: None at this time Active Infection(s): Reviewed. None identified. Lynn Potter is afebrile  Site Confirmation: Lynn Potter was asked to confirm the procedure and laterality before marking the site Procedure checklist: Completed Consent: Before the procedure and under the influence of no sedative(s), amnesic(s), or anxiolytics, the patient was informed of the treatment options, risks and possible complications. To fulfill our ethical and legal obligations, as recommended by the American Medical Association's Code of Ethics, I have informed the patient of my clinical impression; the nature and purpose of the treatment or procedure; the risks, benefits, and possible complications of the intervention; the alternatives, including doing  nothing; the risk(s) and benefit(s) of the alternative treatment(s) or procedure(s); and the risk(s) and benefit(s) of doing nothing. The patient was provided information about the general risks and possible complications associated with the procedure. These may include, but are not limited to: failure  to achieve desired goals, infection, bleeding, organ or nerve damage, allergic reactions, paralysis, and death. In addition, the patient was informed of those risks and complications associated to Spine-related procedures, such as failure to decrease pain; infection (i.e.: Meningitis, epidural or intraspinal abscess); bleeding (i.e.: epidural hematoma, subarachnoid hemorrhage, or any other type of intraspinal or peri-dural bleeding); organ or nerve damage (i.e.: Any type of peripheral nerve, nerve root, or spinal cord injury) with subsequent damage to sensory, motor, and/or autonomic systems, resulting in permanent pain, numbness, and/or weakness of one or several areas of the body; allergic reactions; (i.e.: anaphylactic reaction); and/or death. Furthermore, the patient was informed of those risks and complications associated with the medications. These include, but are not limited to: allergic reactions (i.e.: anaphylactic or anaphylactoid reaction(s)); adrenal axis suppression; blood sugar elevation that in diabetics may result in ketoacidosis or comma; water retention that in patients with history of congestive heart failure may result in shortness of breath, pulmonary edema, and decompensation with resultant heart failure; weight gain; swelling or edema; medication-induced neural toxicity; particulate matter embolism and blood vessel occlusion with resultant organ, and/or nervous system infarction; and/or aseptic necrosis of one or more joints. Finally, the patient was informed that Medicine is not an exact science; therefore, there is also the possibility of unforeseen or unpredictable risks and/or possible  complications that may result in a catastrophic outcome. The patient indicated having understood very clearly. We have given the patient no guarantees and we have made no promises. Enough time was given to the patient to ask questions, all of which were answered to the patient's satisfaction. Lynn Potter has indicated that she wanted to continue with the procedure. Attestation: I, the ordering provider, attest that I have discussed with the patient the benefits, risks, side-effects, alternatives, likelihood of achieving goals, and potential problems during recovery for the procedure that I have provided informed consent. Date  Time: 03/29/2019  8:37 AM  Pre-Procedure Preparation:  Monitoring: As per clinic protocol. Respiration, ETCO2, SpO2, BP, heart rate and rhythm monitor placed and checked for adequate function Safety Precautions: Patient was assessed for positional comfort and pressure points before starting the procedure. Time-out: I initiated and conducted the "Time-out" before starting the procedure, as per protocol. The patient was asked to participate by confirming the accuracy of the "Time Out" information. Verification of the correct person, site, and procedure were performed and confirmed by me, the nursing staff, and the patient. "Time-out" conducted as per Joint Commission's Universal Protocol (UP.01.01.01). Time: 0941  Description of Procedure #1:  Target Area: The inferior and lateral portion of the pedicle, just lateral to a line created by the 6:00 position of the pedicle and the superior articular process of the vertebral body below. On the lateral view, this target lies just posterior to the anterior aspect of the lamina and posterior to the midpoint created between the anterior and the posterior aspect of the neural foramina. Approach: Posterior paravertebral approach. Area Prepped: Entire Posterior Lumbosacral Region Prepping solution: DuraPrep (Iodine Povacrylex [0.7% available  iodine] and Isopropyl Alcohol, 74% w/w) Safety Precautions: Aspiration looking for blood return was conducted prior to all injections. At no point did we inject any substances, as a needle was being advanced. No attempts were made at seeking any paresthesias. Safe injection practices and needle disposal techniques used. Medications properly checked for expiration dates. SDV (single dose vial) medications used.  Description of the Procedure: Protocol guidelines were followed. The patient was placed in position over the fluoroscopy table.  The target area was identified and the area prepped in the usual manner. Skin & deeper tissues infiltrated with local anesthetic. Appropriate amount of time allowed to pass for local anesthetics to take effect. The procedure needles were then advanced to the target area. Proper needle placement secured. Negative aspiration confirmed. Solution injected in intermittent fashion, asking for systemic symptoms every 0.2cc of injectate. The needles were then removed and the area cleansed, making sure to leave some of the prepping solution back to take advantage of its long term bactericidal properties.  Start Time: 0941 hrs.  Materials:  Needle(s) Type: Spinal Needle Gauge: 22G Length: 3.5-in Medication(s): Please see orders for medications and dosing details.  Description of Procedure #2:  Target Area: The  interlaminar space, initially targeting the lower border of the superior vertebral body lamina. Approach: Posterior paramedial approach. Area Prepped: Same as above Prepping solution: Same as above Safety Precautions: Same as above  Description of the Procedure: Protocol guidelines were followed. The patient was placed in position over the fluoroscopy table. The target area was identified and the area prepped in the usual manner. Skin & deeper tissues infiltrated with local anesthetic. Appropriate amount of time allowed to pass for local anesthetics to take effect.  The procedure needle was introduced through the skin, ipsilateral to the reported pain, and advanced to the target area. Bone was contacted and the needle walked caudad, until the lamina was cleared. The ligamentum flavum was engaged and loss-of-resistance technique used as the epidural needle was advanced. The epidural space was identified using "loss-of-resistance technique" with 2-3 ml of PF-NaCl (0.9% NSS), in a 5cc LOR glass syringe. Proper needle placement secured. Negative aspiration confirmed. Solution injected in intermittent fashion, asking for systemic symptoms every 0.5cc of injectate. The needles were then removed and the area cleansed, making sure to leave some of the prepping solution back to take advantage of its long term bactericidal properties.  Vitals:   03/29/19 0950 03/29/19 1000 03/29/19 1010 03/29/19 1020  BP: (!) 93/57 (!) 95/59 108/63 106/60  Pulse: 64     Resp: 16 (!) 23 (!) 21 20  Temp:  (!) 97.1 F (36.2 C)  98.1 F (36.7 C)  SpO2: 98% 100% 100% 100%  Weight:      Height:        End Time: 0950 hrs.  Materials:  Needle(s) Type: Epidural needle Gauge: 17G Length: 3.5-in Medication(s): Please see orders for medications and dosing details.  Imaging Guidance (Spinal):          Type of Imaging Technique: Fluoroscopy Guidance (Spinal) Indication(s): Assistance in needle guidance and placement for procedures requiring needle placement in or near specific anatomical locations not easily accessible without such assistance. Exposure Time: Please see nurses notes. Contrast: Before injecting any contrast, we confirmed that the patient did not have an allergy to iodine, shellfish, or radiological contrast. Once satisfactory needle placement was completed at the desired level, radiological contrast was injected. Contrast injected under live fluoroscopy. No contrast complications. See chart for type and volume of contrast used. Fluoroscopic Guidance: I was personally present  during the use of fluoroscopy. "Tunnel Vision Technique" used to obtain the best possible view of the target area. Parallax error corrected before commencing the procedure. "Direction-depth-direction" technique used to introduce the needle under continuous pulsed fluoroscopy. Once target was reached, antero-posterior, oblique, and lateral fluoroscopic projection used confirm needle placement in all planes. Images permanently stored in EMR. Interpretation: I personally interpreted the imaging intraoperatively. Adequate needle placement confirmed  in multiple planes. Appropriate spread of contrast into desired area was observed. No evidence of afferent or efferent intravascular uptake. No intrathecal or subarachnoid spread observed. Permanent images saved into the patient's record.  Antibiotic Prophylaxis:   Anti-infectives (From admission, onward)   None     Indication(s): None identified  Post-operative Assessment:  Post-procedure Vital Signs:  Pulse/HCG Rate: 6478 Temp: 98.1 F (36.7 C) Resp: 20 BP: 106/60 SpO2: 100 %  EBL: None  Complications: No immediate post-treatment complications observed by team, or reported by patient.  Note: The patient tolerated the entire procedure well. A repeat set of vitals were taken after the procedure and the patient was kept under observation following institutional policy, for this type of procedure. Post-procedural neurological assessment was performed, showing return to baseline, prior to discharge. The patient was provided with post-procedure discharge instructions, including a section on how to identify potential problems. Should any problems arise concerning this procedure, the patient was given instructions to immediately contact us, at any time, without hesitation. In any case, we plan to contact the patient by telephone for a follow-up status report regarding this interventional procedure.  Comments:  No additional relevant information.  Plan of  Care  Orders:  Orders Placed This Encounter  Procedures  . Lumbar Transforaminal Epidural    Scheduling Instructions:     Side: Left-sided     Level: L3     Sedation: With Sedation.     Timeframe: Today    Order Specific Question:   Where will this procedure be performed?    Answer:   [facility]  . Lumbar Epidural Injection    Scheduling Instructions:     Procedure: Interlaminar LESI L2-3     Laterality: Right-sided     Sedation: Patient's choice     Timeframe:  Today    Order Specific Question:   Where will this procedure be performed?    Answer:   [facility]  . DG PAIN CLIN[facility] C-ARM 1-60 MIN NO REPORT    Intraoperative interpretation by procedural physician at [place].    Standing Status:   Standing    Number of Occurrences:   1    Order Specific Question:   Reason for exam:    Answer:   Assistance in needle guidance and placement for procedures requiring needle placement in or near specific anatomical locations not easily accessible without such assistance.  . Provider attestation of informed consent for procedure/surgical case    I, the ordering provider, attest that I have discussed with the patient the benefits, risks, side effects, alternatives, likelihood of achieving goals and potential problems during recovery for the procedure that I have provided informed consent.    Standing Status:   Standing    Number of Occurrences:   1  . Informed Consent Details: Transcribe to consent form and obtain patient signature    Standing Status:   Standing    Number of Occurrences:   1    Order Specific Question:   Procedure    Answer:   Lumbar epidural steroid injection under fluoroscopic guidance. (See notes for level and laterality.)    Order Specific Question:   Surgeon    Answer:   Charlet Harr A. Dossie Arbour, MD    Order Specific Question:   Indication/Reason    Answer:   Low back pain and/or leg pain secondary to lumbar radiculitis/radiculopathy  .  Informed Consent Details: Transcribe to consent form and obtain patient signature    Standing  Status:   Standing    Number of Occurrences:   1    Order Specific Question:   Procedure    Answer:   Lumbar transforaminal epidural steroid injection under fluoroscopic guidance. (See notes for level and laterality.)    Order Specific Question:   Surgeon    Answer:   Cliffie Gingras A. Dossie Arbour, MD    Order Specific Question:   Indication/Reason    Answer:   Lumbar radiculopathy/radiculitis   Medications ordered for procedure: Meds ordered this encounter  Medications  . iohexol (OMNIPAQUE) 180 MG/ML injection 10 mL    Must be Myelogram-compatible. If not available, you may substitute with a water-soluble, non-ionic, hypoallergenic, myelogram-compatible radiological contrast medium.  Marland Kitchen lidocaine (XYLOCAINE) 2 % (with pres) injection 400 mg  . lactated ringers infusion 1,000 mL  . midazolam (VERSED) 5 MG/5ML injection 1-2 mg    Make sure Flumazenil is available in the pyxis when using this medication. If oversedation occurs, administer 0.2 mg IV over 15 sec. If after 45 sec no response, administer 0.2 mg again over 1 min; may repeat at 1 min intervals; not to exceed 4 doses (1 mg)  . fentaNYL (SUBLIMAZE) injection 25-50 mcg    Make sure Narcan is available in the pyxis when using this medication. In the event of respiratory depression (RR< 8/min): Titrate NARCAN (naloxone) in increments of 0.1 to 0.2 mg IV at 2-3 minute intervals, until desired degree of reversal.  . sodium chloride flush (NS) 0.9 % injection 2 mL  . ropivacaine (PF) 2 mg/mL (0.2%) (NAROPIN) injection 2 mL  . triamcinolone acetonide (KENALOG-40) injection 40 mg  . sodium chloride flush (NS) 0.9 % injection 1 mL  . ropivacaine (PF) 2 mg/mL (0.2%) (NAROPIN) injection 1 mL  . dexamethasone (DECADRON) injection 10 mg   Medications administered: We administered iohexol, lidocaine, lactated ringers, midazolam, fentaNYL, sodium chloride  flush, ropivacaine (PF) 2 mg/mL (0.2%), triamcinolone acetonide, sodium chloride flush, ropivacaine (PF) 2 mg/mL (0.2%), and dexamethasone.  See the medical record for exact dosing, route, and time of administration.  Disposition: Discharge home  Discharge Date & Time: 03/29/2019; 1021 hrs.   Follow-up plan:   Return in about 2 weeks (around 04/12/2019) for (VV), E/M (PP).     Recent Visits Date Type Provider Dept  03/21/19 Office Visit Milinda Pointer, MD [facility]  03/10/19 Procedure visit Milinda Pointer, MD [facility]  01/10/19 Office Visit Milinda Pointer, MD [facility]  Showing recent visits within past 90 days and meeting all other requirements   Today's Visits Date Type Provider Dept  03/29/19 Procedure visit Milinda Pointer, MD [facility]  Showing today's visits and meeting all other requirements   Future Appointments Date Type Provider Dept  04/27/19 Appointment Milinda Pointer, MD [facility]  Showing future appointments within next 90 days and meeting all other requirements   Primary Care Physician: Remi Haggard, FNP Location: [facility] Note by: Gaspar Cola, MD Date: 03/29/2019; Time: 12:39 PM  Disclaimer:  Medicine is not an exact science. The only guarantee in medicine is that nothing is guaranteed. It is important to note that the decision to proceed with this intervention was based on the information collected from the patient. The Data and conclusions were drawn from the patient's questionnaire, the interview, and the physical examination. Because the information was provided in large part by the patient, it cannot be guaranteed that it has not been purposely or unconsciously manipulated. Every  effort has been made to obtain as much relevant data as possible for this evaluation. It is important to note that the conclusions that lead to this procedure are  derived in large part from the available data. Always take into account that the treatment will also be dependent on availability of resources and existing treatment guidelines, considered by other Pain Management Practitioners as being common knowledge and practice, at the time of the intervention. For Medico-Legal purposes, it is also important to point out that variation in procedural techniques and pharmacological choices are the acceptable norm. The indications, contraindications, technique, and results of the above procedure should only be interpreted and judged by a Board-Certified Interventional Pain Specialist with extensive familiarity and expertise in the same exact procedure and technique.

## 2019-03-28 NOTE — Patient Instructions (Signed)

## 2019-03-29 ENCOUNTER — Ambulatory Visit (HOSPITAL_BASED_OUTPATIENT_CLINIC_OR_DEPARTMENT_OTHER): Payer: Medicare Other | Admitting: Pain Medicine

## 2019-03-29 ENCOUNTER — Encounter: Payer: Self-pay | Admitting: Pain Medicine

## 2019-03-29 ENCOUNTER — Ambulatory Visit
Admission: RE | Admit: 2019-03-29 | Discharge: 2019-03-29 | Disposition: A | Discharge status: Home / Self Care | Payer: Medicare Other | Source: Ambulatory Visit | Attending: Pain Medicine | Admitting: Pain Medicine

## 2019-03-29 ENCOUNTER — Other Ambulatory Visit: Payer: Self-pay

## 2019-03-29 VITALS — BP 106/60 | HR 64 | Temp 98.1°F | Resp 20 | Ht 64.0 in | Wt 208.0 lb

## 2019-03-29 DIAGNOSIS — M79605 Pain in left leg: Secondary | ICD-10-CM | POA: Diagnosis present

## 2019-03-29 DIAGNOSIS — M48062 Spinal stenosis, lumbar region with neurogenic claudication: Secondary | ICD-10-CM | POA: Diagnosis not present

## 2019-03-29 DIAGNOSIS — M5136 Other intervertebral disc degeneration, lumbar region: Secondary | ICD-10-CM | POA: Diagnosis present

## 2019-03-29 DIAGNOSIS — M5442 Lumbago with sciatica, left side: Secondary | ICD-10-CM | POA: Insufficient documentation

## 2019-03-29 DIAGNOSIS — G8929 Other chronic pain: Secondary | ICD-10-CM

## 2019-03-29 DIAGNOSIS — M961 Postlaminectomy syndrome, not elsewhere classified: Secondary | ICD-10-CM

## 2019-03-29 MED ORDER — DEXAMETHASONE SODIUM PHOSPHATE 10 MG/ML IJ SOLN
10.0000 mg | Freq: Once | INTRAMUSCULAR | Status: AC
  Administered 2019-03-29: 10 mg

## 2019-03-29 MED ORDER — TRIAMCINOLONE ACETONIDE 40 MG/ML IJ SUSP
INTRAMUSCULAR | Status: AC
  Filled 2019-03-29: qty 1

## 2019-03-29 MED ORDER — FENTANYL CITRATE (PF) 100 MCG/2ML IJ SOLN
25.0000 ug | INTRAMUSCULAR | Status: DC | PRN
  Administered 2019-03-29: 09:00:00 50 ug via INTRAVENOUS

## 2019-03-29 MED ORDER — LIDOCAINE HCL 2 % IJ SOLN
INTRAMUSCULAR | Status: AC
  Filled 2019-03-29: qty 20

## 2019-03-29 MED ORDER — FENTANYL CITRATE (PF) 100 MCG/2ML IJ SOLN
INTRAMUSCULAR | Status: AC
  Filled 2019-03-29: qty 2

## 2019-03-29 MED ORDER — MIDAZOLAM HCL 5 MG/5ML IJ SOLN
INTRAMUSCULAR | Status: AC
  Filled 2019-03-29: qty 5

## 2019-03-29 MED ORDER — MIDAZOLAM HCL 5 MG/5ML IJ SOLN
1.0000 mg | INTRAMUSCULAR | Status: DC | PRN
  Administered 2019-03-29: 2 mg via INTRAVENOUS

## 2019-03-29 MED ORDER — ROPIVACAINE HCL 2 MG/ML IJ SOLN
1.0000 mL | Freq: Once | INTRAMUSCULAR | Status: AC
  Administered 2019-03-29: 1 mL via EPIDURAL

## 2019-03-29 MED ORDER — ROPIVACAINE HCL 2 MG/ML IJ SOLN
INTRAMUSCULAR | Status: AC
  Filled 2019-03-29: qty 10

## 2019-03-29 MED ORDER — SODIUM CHLORIDE (PF) 0.9 % IJ SOLN
INTRAMUSCULAR | Status: AC
  Filled 2019-03-29: qty 10

## 2019-03-29 MED ORDER — TRIAMCINOLONE ACETONIDE 40 MG/ML IJ SUSP
40.0000 mg | Freq: Once | INTRAMUSCULAR | Status: AC
  Administered 2019-03-29: 40 mg

## 2019-03-29 MED ORDER — SODIUM CHLORIDE 0.9% FLUSH
1.0000 mL | Freq: Once | INTRAVENOUS | Status: AC
  Administered 2019-03-29: 1 mL

## 2019-03-29 MED ORDER — SODIUM CHLORIDE 0.9% FLUSH
2.0000 mL | Freq: Once | INTRAVENOUS | Status: AC
  Administered 2019-03-29: 2 mL

## 2019-03-29 MED ORDER — ROPIVACAINE HCL 2 MG/ML IJ SOLN
2.0000 mL | Freq: Once | INTRAMUSCULAR | Status: AC
  Administered 2019-03-29: 2 mL via EPIDURAL

## 2019-03-29 MED ORDER — LIDOCAINE HCL 2 % IJ SOLN
20.0000 mL | Freq: Once | INTRAMUSCULAR | Status: AC
  Administered 2019-03-29: 400 mg

## 2019-03-29 MED ORDER — DEXAMETHASONE SODIUM PHOSPHATE 10 MG/ML IJ SOLN
INTRAMUSCULAR | Status: AC
  Filled 2019-03-29: qty 1

## 2019-03-29 MED ORDER — IOHEXOL 180 MG/ML  SOLN
10.0000 mL | Freq: Once | INTRAMUSCULAR | Status: AC
  Administered 2019-03-29: 10:00:00 10 mL via EPIDURAL

## 2019-03-29 MED ORDER — LACTATED RINGERS IV SOLN
1000.0000 mL | Freq: Once | INTRAVENOUS | Status: AC
  Administered 2019-03-29: 1000 mL via INTRAVENOUS

## 2019-03-30 ENCOUNTER — Telehealth: Payer: Self-pay | Admitting: *Deleted

## 2019-03-30 NOTE — Telephone Encounter (Signed)
Spoke with patient reports no problems or concerns from procedure on yesterday.

## 2019-04-19 ENCOUNTER — Encounter: Payer: Self-pay | Admitting: Podiatry

## 2019-04-19 ENCOUNTER — Other Ambulatory Visit: Payer: Self-pay

## 2019-04-19 ENCOUNTER — Ambulatory Visit (INDEPENDENT_AMBULATORY_CARE_PROVIDER_SITE_OTHER): Payer: Medicare Other | Admitting: Podiatry

## 2019-04-19 VITALS — Temp 97.9°F

## 2019-04-19 DIAGNOSIS — M79676 Pain in unspecified toe(s): Secondary | ICD-10-CM | POA: Diagnosis not present

## 2019-04-19 DIAGNOSIS — E0843 Diabetes mellitus due to underlying condition with diabetic autonomic (poly)neuropathy: Secondary | ICD-10-CM | POA: Diagnosis not present

## 2019-04-19 DIAGNOSIS — B351 Tinea unguium: Secondary | ICD-10-CM | POA: Diagnosis not present

## 2019-04-21 NOTE — Progress Notes (Signed)
   SUBJECTIVE Patient with a history of diabetes mellitus presents to office today complaining of elongated, thickened nails that cause pain while ambulating in shoes. She is unable to trim her own nails. Patient is here for further evaluation and treatment.   Past Medical History:  Diagnosis Date  . AF (atrial fibrillation) ([place])   . Arthritis   . Asthma   . Chicken pox   . Diabetes mellitus without complication ([place])   . Glaucoma   . Hemorrhoids   . History of hiatal hernia   . Hypertension   . Migraines   . Obesity   . Opiate use 03/16/2017  . Personal history of tobacco use, presenting hazards to health 06/18/2015  . Sleep apnea     OBJECTIVE General Patient is awake, alert, and oriented x 3 and in no acute distress. Derm Skin is dry and supple bilateral. Negative open lesions or macerations. Remaining integument unremarkable. Nails are tender, long, thickened and dystrophic with subungual debris, consistent with onychomycosis, 1-5 bilateral. No signs of infection noted. Vasc  DP and PT pedal pulses palpable bilaterally. Temperature gradient within normal limits.  Neuro Epicritic and protective threshold sensation diminished bilaterally.  Musculoskeletal Exam No symptomatic pedal deformities noted bilateral. Muscular strength within normal limits.  ASSESSMENT 1. Diabetes Mellitus w/ peripheral neuropathy 2. Onychomycosis of nail due to dermatophyte bilateral 3. Pain in foot bilateral  PLAN OF CARE 1. Patient evaluated today. 2. Instructed to maintain good pedal hygiene and foot care. Stressed importance of controlling blood sugar.  3. Mechanical debridement of nails 1-5 bilaterally performed using a nail nipper. Filed with dremel without incident.  4. Return to clinic in 3 mos.     Edrick Kins, DPM [facility]  Dr. Edrick Kins, [address]                                        [place], [place] [address]                Office [phone]  Fax [phone]

## 2019-04-26 NOTE — Progress Notes (Signed)
Pain Management Virtual Encounter Note - Virtual Visit via Telephone Telehealth (real-time audio visits between healthcare provider and patient).   Patient's Phone No. & Preferred Pharmacy:  [phone] (home); [phone] (mobile); (Preferred) [phone] No e-mail address on record  [place], [place] [address] [address] Phone: [phone] Fax: [phone]  [id number] - [place], [place] [address] [name] [address] Phone: [phone] Fax: [phone]    Pre-screening note:  Our staff contacted Lynn Potter and offered her an "in person", "face-to-face" appointment versus a telephone encounter. She indicated preferring the telephone encounter, at this time.   Reason for Virtual Visit: COVID-19*  Social distancing based on CDC and AMA recommendations.   I contacted Lynn Potter on 04/27/2019 via telephone.      I clearly identified myself as Gaspar Cola, MD. I verified that I was speaking with the correct person using two identifiers (Name: Lynn Potter, and date of birth: 1946/06/01).  Advanced Informed Consent I sought verbal advanced consent from Lynn Potter for virtual visit interactions. I informed Lynn Potter of possible security and privacy concerns, risks, and limitations associated with providing "not-in-person" medical evaluation and management services. I also informed Lynn Potter of the availability of "in-person" appointments. Finally, I informed her that there would be a charge for the virtual visit and that she could be  personally, fully or partially, financially responsible for it. Lynn Potter expressed understanding and agreed to proceed.   Historic Elements   Lynn Potter is a 73 y.o. year old, female patient evaluated today after her last encounter by our practice on 03/30/2019. Lynn Potter  has a past medical history of AF (atrial fibrillation) ([place]), Arthritis,  Asthma, Chicken pox, Diabetes mellitus without complication ([place]), Glaucoma, Hemorrhoids, History of hiatal hernia, Hypertension, Migraines, Obesity, Opiate use (03/16/2017), Personal history of tobacco use, presenting hazards to health (06/18/2015), and Sleep apnea. She also  has a past surgical history that includes Joint replacement; Total knee arthroplasty; Abdominal hysterectomy; Cholecystectomy; Colonoscopy with esophagogastroduodenoscopy (egd); carpal tunnell; Eye surgery; Cataract extraction; Back surgery; Lumbar fusion; Upper esophageal endoscopic ultrasound (eus) (N/A, 01/03/2016); and Esophagogastroduodenoscopy (egd) with propofol (N/A, 02/16/2017). Lynn Potter has a current medication list which includes the following prescription(s): accu-chek aviva plus, accu-chek softclix lancets, amlodipine, vitamin c, aspirin, atorvastatin, azelastine, baclofen, accu-chek aviva plus, chantix, cholecalciferol, combivent respimat, ferrous sulfate, fluconazole, hydroxyzine, insulin aspart, insulin lispro, jardiance, klor-con m10, latanoprost, levemir flextouch, metformin, methocarbamol, olmesartan-hydrochlorothiazide, pantoprazole, pregabalin, ropinirole, and atorvastatin. She  reports that she has been smoking cigarettes. She has been smoking about 1.00 pack per day. She has never used smokeless tobacco. She reports that she does not drink alcohol or use drugs. Lynn Potter has No Known Allergies.   HPI  Today, she is being contacted for a post-procedure assessment.  Post-Procedure Evaluation  Procedure: Diagnostic/therapeutic left L3 TFESI #3 + right L2-3 LESI #3 under fluoroscopic guidance and IV sedation Pre-procedure pain level:  5/10 Post-procedure: 0/10 (100% relief)  Sedation: Sedation provided.  Effectiveness during initial hour after procedure(Ultra-Short Term Relief): 100 %   Local anesthetic used: Long-acting (4-6 hours) Effectiveness: Defined as any analgesic benefit obtained secondary to the  administration of local anesthetics. This carries significant diagnostic value as to the etiological location, or anatomical origin, of the pain. Duration of benefit is expected to coincide with the duration of the local anesthetic used.  Effectiveness during initial 4-6  hours after procedure(Short-Term Relief): 100 %   Long-term benefit: Defined as any relief past the pharmacologic duration of the local anesthetics.  Effectiveness past the initial 6 hours after procedure(Long-Term Relief): 100 %(relief lasted about 1 week)   Current benefits: Defined as benefit that persist at this time.   Analgesia:  >50% relief Function: Lynn Potter reports improvement in function ROM: Lynn Potter reports improvement in ROM  Pharmacotherapy Assessment  Analgesic:  No opioid analgesics from our practice.    Monitoring: Pharmacotherapy: No side-effects or adverse reactions reported.  PMP: PDMP reviewed during this encounter.       Compliance: No problems identified. Effectiveness: Clinically acceptable. Plan: Refer to "POC".  Pertinent Labs   SAFETY SCREENING Profile Lab Results  Component Value Date   SARSCOV2NAA NOT DETECTED 03/25/2019   COVIDSOURCE NASOPHARYNGEAL 03/25/2019   MRSAPCR NEGATIVE 07/28/2017   Renal Function Lab Results  Component Value Date   BUN 20 03/26/2019   CREATININE 0.81 03/26/2019   BCR 10 (L) 11/09/2018   GFRAA >60 03/26/2019   GFRNONAA >60 03/26/2019   Hepatic Function Lab Results  Component Value Date   AST 11 11/09/2018   ALT 152 (H) 05/24/2018   ALBUMIN 3.9 11/09/2018   UDS Summary  Date Value Ref Range Status  11/09/2018 FINAL  Final    Comment:    ==================================================================== TOXASSURE COMP DRUG ANALYSIS,UR ==================================================================== Test                             Result       Flag       Units Drug Present and Declared for Prescription Verification   Pregabalin                      PRESENT      EXPECTED   Baclofen                       PRESENT      EXPECTED   Methocarbamol                  PRESENT      EXPECTED   Hydroxyzine                    PRESENT      EXPECTED Drug Absent but Declared for Prescription Verification   Salicylate                     Not Detected UNEXPECTED    Aspirin, as indicated in the declared medication list, is not    always detected even when used as directed. ==================================================================== Test                      Result    Flag   Units      Ref Range   Creatinine              58               mg/dL      >=20 ==================================================================== Declared Medications:  The flagging and interpretation on this report are based on the  following declared medications.  Unexpected results may arise from  inaccuracies in the declared medications.  **Note: The testing scope of this panel includes these medications:  Baclofen  Hydroxyzine  Methocarbamol  Pregabalin (Lyrica)  **Note: The testing scope of this panel does not  include small to  moderate amounts of these reported medications:  Aspirin (Aspirin 81)  **Note: The testing scope of this panel does not include following  reported medications:  Amlodipine  Ascorbic Acid  Atorvastatin  Azelastine  Benazepril  Cholecalciferol  Empagliflozin (Jardiance)  Fluconazole (Diflucan)  Insulin  Iron (Ferrous Sulfate)  Latanoprost  Metformin  Olmesartan (Benicar)  Pantoprazole  Ropinirole (Requip) ==================================================================== For clinical consultation, please call [phone]. ====================================================================    Note: Above Lab results reviewed.  Recent imaging  DG PAIN CLINIC C-ARM 1-60 MIN NO REPORT Fluoro was used, but no Radiologist interpretation will be provided.  Please refer to "NOTES" tab for provider progress  note. DG PAIN CLINIC C-ARM 1-60 MIN NO REPORT Fluoro was used, but no Radiologist interpretation will be provided.  Please refer to "NOTES" tab for provider progress note.  Assessment  The primary encounter diagnosis was Chronic pain syndrome. 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  Plan of Care  I am having Lynn Potter maintain her aspirin, ferrous sulfate, latanoprost, pregabalin, vitamin C, pantoprazole, metFORMIN, insulin aspart, azelastine, Cholecalciferol, Jardiance, hydrOXYzine, Levemir FlexTouch, olmesartan-hydrochlorothiazide, atorvastatin, amLODipine, fluconazole, Accu-Chek Aviva Plus, baclofen, insulin lispro, Accu-Chek Aviva Plus, Accu-Chek Softclix Lancets, methocarbamol, Combivent Respimat, Klor-Con M10, Chantix, rOPINIRole, and atorvastatin.  Pharmacotherapy (Medications Ordered): No orders of the defined types were placed in this encounter.  Orders:  No orders of the defined types were placed in this encounter.  Follow-up plan:   Return if symptoms worsen or fail to improve.      Interventional management options: Considering: Diagnosticright L3-4LESI Diagnostic bilateral L3 transforaminal LESI Diagnostic bilateral lumbar facet block#2 Possible bilateral lumbar facetRFA Diagnostic bilateralsuprascapularnerve block Possible bilateral suprascapularRFA Diagnostic bilateral T11 transforaminal TESI   Palliative PRN treatment(s): Palliative bilateral lumbar facet blocks Diagnostic/therapeutic left L3 TFESI #4 + right L2-3 LESI #4 (100/100/100/>50)    Recent Visits Date Type Provider Dept  03/29/19 Procedure visit Milinda Pointer, MD [facility]  03/21/19 Office Visit Milinda Pointer, MD [facility]  03/10/19 Procedure visit Milinda Pointer, MD [facility]  Showing recent visits within past 90 days and meeting all other requirements   Today's Visits Date Type Provider Dept  04/27/19 Office Visit Milinda Pointer, MD [facility]  Showing today's visits and meeting all other requirements   Future Appointments No visits were found meeting these conditions.  Showing future appointments within next 90 days and meeting all other requirements   I discussed the assessment and treatment plan with the patient. The patient was provided an opportunity to ask questions and all were answered. The patient agreed with the plan and demonstrated an understanding of the instructions.  Patient advised to call back or seek an in-person evaluation if the symptoms or condition worsens.  Total duration of non-face-to-face encounter: 12 minutes.  Note by: Gaspar Cola, MD Date: 04/27/2019; Time: 9:23 AM  Note: This dictation was prepared with Dragon dictation. Any transcriptional errors that may result from this process are unintentional.  Disclaimer:  * Given the special circumstances of the COVID-19 pandemic, the federal government has announced that the Office for Civil Rights (OCR) will exercise its enforcement discretion and will not impose penalties on physicians using telehealth in the event of noncompliance with regulatory requirements under the  Health Insurance Portability and Accountability Act (HIPAA) in connection with the good faith provision of telehealth during the NWGNF-62 national public health emergency. (AMA)

## 2019-04-27 ENCOUNTER — Encounter: Payer: Self-pay | Admitting: Pain Medicine

## 2019-04-27 ENCOUNTER — Other Ambulatory Visit: Payer: Self-pay

## 2019-04-27 ENCOUNTER — Ambulatory Visit: Payer: Medicare Other | Attending: Pain Medicine | Admitting: Pain Medicine

## 2019-04-27 DIAGNOSIS — M5442 Lumbago with sciatica, left side: Secondary | ICD-10-CM

## 2019-04-27 DIAGNOSIS — M48062 Spinal stenosis, lumbar region with neurogenic claudication: Secondary | ICD-10-CM

## 2019-04-27 DIAGNOSIS — M79605 Pain in left leg: Secondary | ICD-10-CM | POA: Diagnosis not present

## 2019-04-27 DIAGNOSIS — G8929 Other chronic pain: Secondary | ICD-10-CM

## 2019-04-27 DIAGNOSIS — M961 Postlaminectomy syndrome, not elsewhere classified: Secondary | ICD-10-CM

## 2019-04-27 DIAGNOSIS — G894 Chronic pain syndrome: Secondary | ICD-10-CM | POA: Diagnosis not present

## 2019-06-29 ENCOUNTER — Ambulatory Visit: Payer: Medicare Other

## 2019-07-05 ENCOUNTER — Other Ambulatory Visit: Payer: Self-pay

## 2019-07-05 ENCOUNTER — Encounter: Payer: Self-pay | Admitting: Internal Medicine

## 2019-07-05 ENCOUNTER — Inpatient Hospital Stay: Payer: Medicare Other | Attending: Internal Medicine | Admitting: Internal Medicine

## 2019-07-05 ENCOUNTER — Inpatient Hospital Stay: Payer: Medicare Other

## 2019-07-05 DIAGNOSIS — Z794 Long term (current) use of insulin: Secondary | ICD-10-CM | POA: Diagnosis not present

## 2019-07-05 DIAGNOSIS — E785 Hyperlipidemia, unspecified: Secondary | ICD-10-CM | POA: Diagnosis not present

## 2019-07-05 DIAGNOSIS — I10 Essential (primary) hypertension: Secondary | ICD-10-CM | POA: Diagnosis not present

## 2019-07-05 DIAGNOSIS — M549 Dorsalgia, unspecified: Secondary | ICD-10-CM | POA: Diagnosis not present

## 2019-07-05 DIAGNOSIS — D509 Iron deficiency anemia, unspecified: Secondary | ICD-10-CM

## 2019-07-05 DIAGNOSIS — M25559 Pain in unspecified hip: Secondary | ICD-10-CM

## 2019-07-05 DIAGNOSIS — Z833 Family history of diabetes mellitus: Secondary | ICD-10-CM | POA: Diagnosis not present

## 2019-07-05 DIAGNOSIS — I4891 Unspecified atrial fibrillation: Secondary | ICD-10-CM | POA: Diagnosis not present

## 2019-07-05 DIAGNOSIS — Z79899 Other long term (current) drug therapy: Secondary | ICD-10-CM

## 2019-07-05 DIAGNOSIS — Z7982 Long term (current) use of aspirin: Secondary | ICD-10-CM | POA: Diagnosis not present

## 2019-07-05 DIAGNOSIS — R5383 Other fatigue: Secondary | ICD-10-CM | POA: Insufficient documentation

## 2019-07-05 DIAGNOSIS — E119 Type 2 diabetes mellitus without complications: Secondary | ICD-10-CM | POA: Insufficient documentation

## 2019-07-05 DIAGNOSIS — F17210 Nicotine dependence, cigarettes, uncomplicated: Secondary | ICD-10-CM | POA: Insufficient documentation

## 2019-07-05 DIAGNOSIS — C7A010 Malignant carcinoid tumor of the duodenum: Secondary | ICD-10-CM | POA: Diagnosis not present

## 2019-07-05 DIAGNOSIS — Z7984 Long term (current) use of oral hypoglycemic drugs: Secondary | ICD-10-CM | POA: Diagnosis not present

## 2019-07-05 LAB — CBC WITH DIFFERENTIAL/PLATELET
Abs Immature Granulocytes: 0.03 10*3/uL (ref 0.00–0.07)
Basophils Absolute: 0 10*3/uL (ref 0.0–0.1)
Basophils Relative: 0 %
Eosinophils Absolute: 0.2 10*3/uL (ref 0.0–0.5)
Eosinophils Relative: 2 %
HCT: 30.5 % — ABNORMAL LOW (ref 36.0–46.0)
Hemoglobin: 9.1 g/dL — ABNORMAL LOW (ref 12.0–15.0)
Immature Granulocytes: 0 %
Lymphocytes Relative: 26 %
Lymphs Abs: 1.9 10*3/uL (ref 0.7–4.0)
MCH: 21.6 pg — ABNORMAL LOW (ref 26.0–34.0)
MCHC: 29.8 g/dL — ABNORMAL LOW (ref 30.0–36.0)
MCV: 72.3 fL — ABNORMAL LOW (ref 80.0–100.0)
Monocytes Absolute: 0.5 10*3/uL (ref 0.1–1.0)
Monocytes Relative: 6 %
Neutro Abs: 4.9 10*3/uL (ref 1.7–7.7)
Neutrophils Relative %: 66 %
Platelets: 312 10*3/uL (ref 150–400)
RBC: 4.22 MIL/uL (ref 3.87–5.11)
RDW: 16.4 % — ABNORMAL HIGH (ref 11.5–15.5)
WBC: 7.5 10*3/uL (ref 4.0–10.5)
nRBC: 0 % (ref 0.0–0.2)

## 2019-07-05 LAB — IRON AND TIBC
Iron: 17 ug/dL — ABNORMAL LOW (ref 28–170)
Saturation Ratios: 4 % — ABNORMAL LOW (ref 10.4–31.8)
TIBC: 401 ug/dL (ref 250–450)
UIBC: 384 ug/dL

## 2019-07-05 LAB — LACTATE DEHYDROGENASE: LDH: 90 U/L — ABNORMAL LOW (ref 98–192)

## 2019-07-05 LAB — RETICULOCYTES
Immature Retic Fract: 15.7 % (ref 2.3–15.9)
RBC.: 4.22 MIL/uL (ref 3.87–5.11)
Retic Count, Absolute: 74.3 10*3/uL (ref 19.0–186.0)
Retic Ct Pct: 1.8 % (ref 0.4–3.1)

## 2019-07-05 LAB — VITAMIN B12: Vitamin B-12: 140 pg/mL — ABNORMAL LOW (ref 180–914)

## 2019-07-05 LAB — FOLATE: Folate: 36 ng/mL (ref >5.9)

## 2019-07-05 LAB — FERRITIN: Ferritin: 12 ng/mL (ref 11–307)

## 2019-07-05 NOTE — Assessment & Plan Note (Addendum)
#   Chronic mild anemia microcytic-however more recent worsening noted hemoglobin around 9.  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  #Recommend repeat lab CBC/LDH haptoglobin reticulocyte count iron studies ferritin [id number] folic acid myeloma panel.  Patient is currently on p.o. iron; feels tired.  Discussed regarding IV iron infusions-if noted to have iron deficiency. Discussed the potential acute infusion reactions with IV iron; which are quite rare.  Patient understands the risk; will proceed with infusions-if recommended.  # NET remote history-stage I.  Low risk of recurrence.  S/p bowel re-section; clinically no dense of recurrence.  Await EGD as planned  # Smoking-  Discussed with the patient regarding the ill effects of smoking- including but not limited to cardiac lung and vascular diseases and malignancies. Counseled against smoking.  Defer to PCP for further recommendations.  Discussed regarding lung cancer screening program at length; which includes low-dose CT scan on annual basis for 5 years.  Based upon the findings patient would be recommended surveillance/biopsies/or surgery.  Lung cancer program has shown to save lives by early detection of lung cancer.  Patient was asked to inform us if she was interested in pursuing the program.    # DISPOSITION:  # labs today # follow up TBD- Dr.B

## 2019-07-05 NOTE — Progress Notes (Signed)
[facility] NOTE  Patient Care Team: Remi Haggard, FNP as PCP - General (Family Medicine)  CHIEF COMPLAINTS/PURPOSE OF CONSULTATION: Anemia  Oncology History Overview Note  # 2014-  DUODENAL NEUROENDOCRINE TUMOR, ENDOSCOPIC RESECTION:  - WELL-DIFFERENTIATED NEUROENDOCRINE TUMOR.; Tumor size: 0.6 cm  Tumor focality: single focus; Histologic type: well differentiated neuroendocrine tumor  Histologic grade: grade 1; Mitotic rate: < 2 mitosis in 10 hpf and < 3% KI-67 index   #Chronic mild anemia microcytic-baseline-11-12; June September 2020-9; microcytic; [multiple prior EGD/colonoscopies; [facility]]; no bone marrow biopsy  # DM/active smoker/remote history of TIA   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  female has been referred to [place] for further evaluation/work-up for anemia.  Patient states that she had been anemic all her life.  However more recently noted to have her hemoglobin running lower.  She has been more tired.  Denies any blood in stools or black-colored stools.  No blood in urine.  Mild difficulty swallowing-for which she was evaluated by GI; patient is currently awaiting EGD/colonoscopy through [facility] October 27th.   Patient is currently on p.o. iron.  Denies any significant weight loss.  Denies any blood in urine denies any vaginal bleeding.  Review of Systems  Constitutional: Positive for malaise/fatigue. Negative for chills, diaphoresis, fever and weight loss.  HENT: Negative for nosebleeds and sore throat.   Eyes: Negative for double vision.  Respiratory: Negative for cough, hemoptysis, sputum production, shortness of breath and wheezing.   Cardiovascular: Negative for chest pain, palpitations, orthopnea and leg swelling.  Gastrointestinal: Negative for abdominal pain, blood in stool, constipation,  diarrhea, heartburn, melena, nausea and vomiting.  Genitourinary: Negative for dysuria, frequency and urgency.  Musculoskeletal: Positive for back pain and joint pain.  Skin: Negative.  Negative for itching and rash.  Neurological: Negative for dizziness, tingling, focal weakness, weakness and headaches.  Endo/Heme/Allergies: Does not bruise/bleed easily.  Psychiatric/Behavioral: Negative for depression. The patient is not nervous/anxious and does not have insomnia.     MEDICAL HISTORY:  Past Medical History:  Diagnosis Date  . AF (atrial fibrillation) ([place])   . Arthritis   . Asthma   . Chicken pox   . Diabetes mellitus without complication ([place])   . Glaucoma   . Hemorrhoids   . History of hiatal hernia   . Hypertension   . Migraines   . Obesity   . Opiate use 03/16/2017  . Personal history of tobacco use, presenting hazards to health 06/18/2015  . Sleep apnea     SURGICAL HISTORY: Past Surgical History:  Procedure Laterality Date  . ABDOMINAL HYSTERECTOMY    . BACK SURGERY    . carpal tunnell    . CATARACT EXTRACTION    . CHOLECYSTECTOMY    . COLONOSCOPY WITH ESOPHAGOGASTRODUODENOSCOPY (EGD)    . ESOPHAGOGASTRODUODENOSCOPY (EGD) WITH PROPOFOL N/A 02/16/2017   Procedure: ESOPHAGOGASTRODUODENOSCOPY (EGD) WITH PROPOFOL;  Surgeon: Manya Silvas, MD;  Location[facility] END[facility]COPY;  Service: Endoscopy;  Laterality: N/A;  . EYE SURGERY    . JOINT REPLACEMENT    . LUMBAR FUSION    . TOTAL KNEE ARTHROPLASTY    . UPPER ESOPHAGEAL ENDOSCOPIC ULTRASOUND (EUS) N/A 01/03/2016   Procedure: UPPER ESOPHAGEAL ENDOSCOPIC ULTRASOUND (EUS);  Surgeon: Holly Bodily, MD;  Location: [facility] ENDOSCOPY;  Service: Gastroenterology;  Laterality: N/A;    SOCIAL  HISTORY: Social History   Socioeconomic History  . Marital status: Single    Spouse name: Not on file  . Number of children: Not on file  . Years of education: Not on file  . Highest education level: Not on file  Occupational  History  . Not on file  Social Needs  . Financial resource strain: Not on file  . Food insecurity    Worry: Not on file    Inability: Not on file  . Transportation needs    Medical: Not on file    Non-medical: Not on file  Tobacco Use  . Smoking status: Current Every Day Smoker    Packs/day: 1.00    Types: Cigarettes  . Smokeless tobacco: Never Used  Substance and Sexual Activity  . Alcohol use: No  . Drug use: No  . Sexual activity: Not on file  Lifestyle  . Physical activity    Days per week: Not on file    Minutes per session: Not on file  . Stress: Not on file  Relationships  . Social Herbalist on phone: Not on file    Gets together: Not on file    Attends religious service: Not on file    Active member of club or organization: Not on file    Attends meetings of clubs or organizations: Not on file    Relationship status: Not on file  . Intimate partner violence    Fear of current or ex partner: Not on file    Emotionally abused: Not on file    Physically abused: Not on file    Forced sexual activity: Not on file  Other Topics Concern  . Not on file  Social History Narrative   Lives in [place], lives alone. Rolling walker sec to back/hip pain. 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 Smoke ~1ppd/>50; No alcohol.     FAMILY HISTORY: Family History  Problem Relation Age of Onset  . Diabetes Mother   . Diabetes Sister     ALLERGIES:  has No Known Allergies.  MEDICATIONS:  Current Outpatient Medications  Medication Sig Dispense Refill  . ACCU-CHEK AVIVA PLUS test strip     . ACCU-CHEK SOFTCLIX LANCETS lancets 2 (two) times daily.    Marland Kitchen amLODipine (NORVASC) 10 MG tablet Take 10 mg by mouth daily.  5  . Ascorbic Acid (VITAMIN C) 1000 MG tablet Take 1,000 mg by mouth daily.    Marland Kitchen aspirin 81 MG tablet Take 81 mg by mouth daily.    Marland Kitchen atorvastatin (LIPITOR) 20 MG tablet Take 20 mg by mouth daily.    Marland Kitchen azelastine (ASTELIN) 0.1 % nasal spray Place 1 spray into both  nostrils 2 (two) times daily as needed for rhinitis or allergies.   11  . baclofen (LIORESAL) 10 MG tablet 10 mg 2 (two) times daily.     . Blood Glucose Monitoring Suppl (ACCU-CHEK AVIVA PLUS) w/Device KIT EVERY DAY    . CHANTIX 0.5 MG tablet     . Cholecalciferol (D-5000) 5000 units TABS Take 5,000 Units by mouth daily.     . COMBIVENT RESPIMAT 20-100 MCG/ACT AERS respimat USE 1 VIAL VIA NEBULIZER 4 TIMES A DAY AS NEEDED    . ferrous sulfate 325 (65 FE) MG EC tablet Take 325 mg by mouth 2 (two) times daily.     . fluconazole (DIFLUCAN) 150 MG tablet Take 150 mg by mouth daily.  0  . hydrOXYzine (VISTARIL) 25 MG capsule Take 25 mg by mouth  every 12 (twelve) hours.   0  . insulin aspart (NOVOLOG) 100 UNIT/ML injection Inject 0-9 Units into the skin 3 (three) times daily with meals. 10 mL 11  . insulin lispro (HUMALOG KWIKPEN) 100 UNIT/ML KwikPen Inject into the skin.    Marland Kitchen JARDIANCE 25 MG TABS tablet Take 25 mg by mouth daily.     Marland Kitchen KLOR-CON M10 10 MEQ tablet Take 10 mEq by mouth daily.    Marland Kitchen latanoprost (XALATAN) 0.005 % ophthalmic solution Place 1 drop into both eyes at bedtime.     Marland Kitchen LEVEMIR FLEXTOUCH 100 UNIT/ML Pen Inject 25 Units into the skin daily. Patient reports she only takes Levemir if her glucose is >150 mg/dl which is rare for her  5  . metFORMIN (GLUCOPHAGE) 500 MG tablet Take 1 tablet (500 mg total) by mouth 2 (two) times daily with a meal. 60 tablet 0  . methocarbamol (ROBAXIN) 500 MG tablet Take 500 mg by mouth 3 (three) times daily.    Marland Kitchen olmesartan-hydrochlorothiazide (BENICAR HCT) 40-25 MG tablet Take 1 tablet by mouth daily.  5  . pantoprazole (PROTONIX) 40 MG tablet Take 1 tablet by mouth 2 (two) times daily.     . pregabalin (LYRICA) 150 MG capsule Take 150 mg by mouth 2 (two) times daily.     Marland Kitchen rOPINIRole (REQUIP) 1 MG tablet TAKE 1 TABLET BY MOUTH EVERYDAY AT BEDTIME    . atorvastatin (LIPITOR) 40 MG tablet Take 1 tablet (40 mg total) by mouth daily. 30 tablet 0   No  current facility-administered medications for this visit.       PHYSICAL EXAMINATION:   Vitals:   07/05/19 1113  BP: 119/74  Pulse: (!) 101  Temp: 97.8 F (36.6 C)   Filed Weights   07/05/19 1113  Weight: 206 lb (93.4 kg)    Physical Exam  Constitutional: She is oriented to person, place, and time and well-developed, well-nourished, and in no distress.  Obese.  Walking with a rolling walker.  She is alone.  HENT:  Head: Normocephalic and atraumatic.  Mouth/Throat: Oropharynx is clear and moist. No oropharyngeal exudate.  Eyes: Pupils are equal, round, and reactive to light.  Neck: Normal range of motion. Neck supple.  Cardiovascular: Normal rate and regular rhythm.  Pulmonary/Chest: No respiratory distress. She has no wheezes.  Decreased air entry bilaterally.  Abdominal: Soft. Bowel sounds are normal. She exhibits no distension and no mass. There is no abdominal tenderness. There is no rebound and no guarding.  Musculoskeletal: Normal range of motion.        General: No tenderness or edema.  Neurological: She is alert and oriented to person, place, and time.  Skin: Skin is warm.  Psychiatric: Affect normal.    LABORATORY DATA:  I have reviewed the data as listed Lab Results  Component Value Date   WBC 7.5 07/05/2019   HGB 9.1 (L) 07/05/2019   HCT 30.5 (L) 07/05/2019   MCV 72.3 (L) 07/05/2019   PLT 312 07/05/2019   Recent Labs    11/09/18 1120 03/26/19 2128  NA 138 130*  K 3.9 3.5  CL 99 97*  CO2  --  23  GLUCOSE 188* 144*  BUN 9 20  CREATININE 0.86 0.81  CALCIUM 9.4 8.8*  GFRNONAA 67 >60  GFRAA 77 >60  PROT 7.0  --   ALBUMIN 3.9  --   AST 11  --   ALKPHOS 91  --   BILITOT 0.4  --  No results found.  Microcytic anemia # Chronic mild anemia microcytic-however more recent worsening noted hemoglobin around 9.  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  #Recommend repeat lab CBC/LDH haptoglobin reticulocyte count iron  studies ferritin D67 folic acid myeloma panel.  Patient is currently on p.o. iron; feels tired.  Discussed regarding IV iron infusions-if noted to have iron deficiency. Discussed the potential acute infusion reactions with IV iron; which are quite rare.  Patient understands the risk; will proceed with infusions-if recommended.  # NET remote history-stage I.  Low risk of recurrence.  S/p bowel re-section; clinically no dense of recurrence.  Await EGD as planned  # Smoking-  Discussed with the patient regarding the ill effects of smoking- including but not limited to cardiac lung and vascular diseases and malignancies. Counseled against smoking.  Defer to PCP for further recommendations.  Discussed regarding lung cancer screening program at length; which includes low-dose CT scan on annual basis for 5 years.  Based upon the findings patient would be recommended surveillance/biopsies/or surgery.  Lung cancer program has shown to save lives by early detection of lung cancer.  Patient was asked to inform us if she was interested in pursuing the program.    # DISPOSITION:  # labs today # follow up TBD- Dr.B   All questions were answered. The patient knows to call the clinic with any problems, questions or concerns.    Cammie Sickle, MD 07/05/2019 12:19 PM

## 2019-07-06 LAB — KAPPA/LAMBDA LIGHT CHAINS
Kappa free light chain: 50.6 mg/L — ABNORMAL HIGH (ref 3.3–19.4)
Kappa, lambda light chain ratio: 1.21 (ref 0.26–1.65)
Lambda free light chains: 41.9 mg/L — ABNORMAL HIGH (ref 5.7–26.3)

## 2019-07-06 LAB — HAPTOGLOBIN: Haptoglobin: 175 mg/dL (ref 42–346)

## 2019-07-07 LAB — MULTIPLE MYELOMA PANEL, SERUM
Albumin SerPl Elph-Mcnc: 3.3 g/dL (ref 2.9–4.4)
Albumin/Glob SerPl: 1 (ref 0.7–1.7)
Alpha 1: 0.2 g/dL (ref 0.0–0.4)
Alpha2 Glob SerPl Elph-Mcnc: 0.8 g/dL (ref 0.4–1.0)
B-Globulin SerPl Elph-Mcnc: 1.3 g/dL (ref 0.7–1.3)
Gamma Glob SerPl Elph-Mcnc: 1.1 g/dL (ref 0.4–1.8)
Globulin, Total: 3.5 g/dL (ref 2.2–3.9)
IgA: 428 mg/dL — ABNORMAL HIGH (ref 64–422)
IgG (Immunoglobin G), Serum: 1391 mg/dL (ref 586–1602)
IgM (Immunoglobulin M), Srm: 33 mg/dL (ref 26–217)
Total Protein ELP: 6.8 g/dL (ref 6.0–8.5)

## 2019-07-19 ENCOUNTER — Other Ambulatory Visit: Payer: Self-pay

## 2019-07-19 ENCOUNTER — Encounter: Payer: Self-pay | Admitting: Podiatry

## 2019-07-19 ENCOUNTER — Ambulatory Visit (INDEPENDENT_AMBULATORY_CARE_PROVIDER_SITE_OTHER): Payer: Medicare Other | Admitting: Podiatry

## 2019-07-19 DIAGNOSIS — M65272 Calcific tendinitis, left ankle and foot: Secondary | ICD-10-CM | POA: Diagnosis not present

## 2019-07-19 DIAGNOSIS — M79676 Pain in unspecified toe(s): Secondary | ICD-10-CM

## 2019-07-19 DIAGNOSIS — M65271 Calcific tendinitis, right ankle and foot: Secondary | ICD-10-CM | POA: Diagnosis not present

## 2019-07-19 DIAGNOSIS — E0843 Diabetes mellitus due to underlying condition with diabetic autonomic (poly)neuropathy: Secondary | ICD-10-CM | POA: Diagnosis not present

## 2019-07-19 DIAGNOSIS — B351 Tinea unguium: Secondary | ICD-10-CM | POA: Diagnosis not present

## 2019-07-19 DIAGNOSIS — M659 Synovitis and tenosynovitis, unspecified: Secondary | ICD-10-CM

## 2019-07-21 ENCOUNTER — Emergency Department: Payer: Medicare Other

## 2019-07-21 ENCOUNTER — Other Ambulatory Visit: Payer: Self-pay

## 2019-07-21 ENCOUNTER — Inpatient Hospital Stay
Admission: EM | Admit: 2019-07-21 | Discharge: 2019-07-24 | DRG: 683 | Disposition: A | Discharge status: Home Health | Payer: Medicare Other | Attending: Internal Medicine | Admitting: Internal Medicine

## 2019-07-21 DIAGNOSIS — R531 Weakness: Secondary | ICD-10-CM

## 2019-07-21 DIAGNOSIS — Z833 Family history of diabetes mellitus: Secondary | ICD-10-CM

## 2019-07-21 DIAGNOSIS — R6510 Systemic inflammatory response syndrome (SIRS) of non-infectious origin without acute organ dysfunction: Secondary | ICD-10-CM | POA: Diagnosis present

## 2019-07-21 DIAGNOSIS — N179 Acute kidney failure, unspecified: Principal | ICD-10-CM | POA: Diagnosis present

## 2019-07-21 DIAGNOSIS — F17210 Nicotine dependence, cigarettes, uncomplicated: Secondary | ICD-10-CM | POA: Diagnosis present

## 2019-07-21 DIAGNOSIS — I959 Hypotension, unspecified: Secondary | ICD-10-CM

## 2019-07-21 DIAGNOSIS — Z981 Arthrodesis status: Secondary | ICD-10-CM

## 2019-07-21 DIAGNOSIS — E119 Type 2 diabetes mellitus without complications: Secondary | ICD-10-CM | POA: Diagnosis present

## 2019-07-21 DIAGNOSIS — H409 Unspecified glaucoma: Secondary | ICD-10-CM | POA: Diagnosis present

## 2019-07-21 DIAGNOSIS — E860 Dehydration: Secondary | ICD-10-CM | POA: Diagnosis present

## 2019-07-21 DIAGNOSIS — J0110 Acute frontal sinusitis, unspecified: Secondary | ICD-10-CM | POA: Diagnosis present

## 2019-07-21 DIAGNOSIS — I10 Essential (primary) hypertension: Secondary | ICD-10-CM | POA: Diagnosis present

## 2019-07-21 DIAGNOSIS — G4730 Sleep apnea, unspecified: Secondary | ICD-10-CM | POA: Diagnosis present

## 2019-07-21 DIAGNOSIS — Z96659 Presence of unspecified artificial knee joint: Secondary | ICD-10-CM | POA: Diagnosis present

## 2019-07-21 DIAGNOSIS — J0100 Acute maxillary sinusitis, unspecified: Secondary | ICD-10-CM | POA: Diagnosis present

## 2019-07-21 DIAGNOSIS — G4733 Obstructive sleep apnea (adult) (pediatric): Secondary | ICD-10-CM | POA: Diagnosis present

## 2019-07-21 DIAGNOSIS — M1990 Unspecified osteoarthritis, unspecified site: Secondary | ICD-10-CM | POA: Diagnosis present

## 2019-07-21 DIAGNOSIS — Z20828 Contact with and (suspected) exposure to other viral communicable diseases: Secondary | ICD-10-CM | POA: Diagnosis present

## 2019-07-21 DIAGNOSIS — R112 Nausea with vomiting, unspecified: Secondary | ICD-10-CM | POA: Diagnosis not present

## 2019-07-21 DIAGNOSIS — I4891 Unspecified atrial fibrillation: Secondary | ICD-10-CM | POA: Diagnosis present

## 2019-07-21 DIAGNOSIS — Z7951 Long term (current) use of inhaled steroids: Secondary | ICD-10-CM

## 2019-07-21 DIAGNOSIS — K449 Diaphragmatic hernia without obstruction or gangrene: Secondary | ICD-10-CM | POA: Diagnosis present

## 2019-07-21 DIAGNOSIS — Z7982 Long term (current) use of aspirin: Secondary | ICD-10-CM

## 2019-07-21 DIAGNOSIS — Z794 Long term (current) use of insulin: Secondary | ICD-10-CM

## 2019-07-21 DIAGNOSIS — G43909 Migraine, unspecified, not intractable, without status migrainosus: Secondary | ICD-10-CM | POA: Diagnosis present

## 2019-07-21 HISTORY — DX: Acute kidney failure, unspecified: N17.9

## 2019-07-21 LAB — URINALYSIS, COMPLETE (UACMP) WITH MICROSCOPIC
Bacteria, UA: NONE SEEN
Bilirubin Urine: NEGATIVE
Glucose, UA: >=500 mg/dL — AB
Hgb urine dipstick: NEGATIVE
Ketones, ur: NEGATIVE mg/dL
Nitrite: NEGATIVE
Protein, ur: NEGATIVE mg/dL
Specific Gravity, Urine: 1.009 (ref 1.005–1.030)
pH: 6 (ref 5.0–8.0)

## 2019-07-21 LAB — CBC
HCT: 32.6 % — ABNORMAL LOW (ref 36.0–46.0)
Hemoglobin: 9.7 g/dL — ABNORMAL LOW (ref 12.0–15.0)
MCH: 21.4 pg — ABNORMAL LOW (ref 26.0–34.0)
MCHC: 29.8 g/dL — ABNORMAL LOW (ref 30.0–36.0)
MCV: 72 fL — ABNORMAL LOW (ref 80.0–100.0)
Platelets: 388 10*3/uL (ref 150–400)
RBC: 4.53 MIL/uL (ref 3.87–5.11)
RDW: 16.8 % — ABNORMAL HIGH (ref 11.5–15.5)
WBC: 17 10*3/uL — ABNORMAL HIGH (ref 4.0–10.5)
nRBC: 0 % (ref 0.0–0.2)

## 2019-07-21 LAB — LACTIC ACID, PLASMA
Lactic Acid, Venous: 3.3 mmol/L (ref 0.5–1.9)
Lactic Acid, Venous: 1.9 mmol/L (ref 0.5–1.9)

## 2019-07-21 LAB — BASIC METABOLIC PANEL
Anion gap: 9 (ref 5–15)
BUN: 22 mg/dL (ref 8–23)
CO2: 24 mmol/L (ref 22–32)
Calcium: 9.8 mg/dL (ref 8.9–10.3)
Chloride: 102 mmol/L (ref 98–111)
Creatinine, Ser: 1.21 mg/dL — ABNORMAL HIGH (ref 0.44–1.00)
GFR calc Af Amer: 51 mL/min — ABNORMAL LOW (ref >60)
GFR calc non Af Amer: 44 mL/min — ABNORMAL LOW (ref >60)
Glucose, Bld: 116 mg/dL — ABNORMAL HIGH (ref 70–99)
Potassium: 3.2 mmol/L — ABNORMAL LOW (ref 3.5–5.1)
Sodium: 135 mmol/L (ref 135–145)

## 2019-07-21 LAB — TROPONIN I (HIGH SENSITIVITY): Troponin I (High Sensitivity): 3 ng/L (ref <18)

## 2019-07-21 LAB — GLUCOSE, CAPILLARY
Glucose-Capillary: 124 mg/dL — ABNORMAL HIGH (ref 70–99)
Glucose-Capillary: 121 mg/dL — ABNORMAL HIGH (ref 70–99)

## 2019-07-21 LAB — PROCALCITONIN: Procalcitonin: <0.1 ng/mL

## 2019-07-21 MED ORDER — PANTOPRAZOLE SODIUM 40 MG PO TBEC
40.0000 mg | DELAYED_RELEASE_TABLET | Freq: Two times a day (BID) | ORAL | Status: DC
  Administered 2019-07-21 – 2019-07-24 (×6): 40 mg via ORAL
  Filled 2019-07-21 (×6): qty 1

## 2019-07-21 MED ORDER — ASPIRIN 81 MG PO CHEW
81.0000 mg | CHEWABLE_TABLET | Freq: Every day | ORAL | Status: DC
  Administered 2019-07-22 – 2019-07-24 (×3): 81 mg via ORAL
  Filled 2019-07-21 (×3): qty 1

## 2019-07-21 MED ORDER — INSULIN ASPART 100 UNIT/ML ~~LOC~~ SOLN: 0.0000 [IU] | Freq: Every day | SUBCUTANEOUS | Status: DC

## 2019-07-21 MED ORDER — SODIUM CHLORIDE 0.9 % IV BOLUS
1000.0000 mL | Freq: Once | INTRAVENOUS | Status: AC
  Administered 2019-07-21: 1000 mL via INTRAVENOUS

## 2019-07-21 MED ORDER — METRONIDAZOLE IN NACL 5-0.79 MG/ML-% IV SOLN
500.0000 mg | Freq: Once | INTRAVENOUS | Status: AC
  Administered 2019-07-21: 500 mg via INTRAVENOUS
  Filled 2019-07-21: qty 100

## 2019-07-21 MED ORDER — ONDANSETRON HCL 4 MG/2ML IJ SOLN: 4.0000 mg | Freq: Four times a day (QID) | INTRAMUSCULAR | Status: DC | PRN

## 2019-07-21 MED ORDER — SODIUM CHLORIDE 0.9 % IV SOLN
2.0000 g | Freq: Once | INTRAVENOUS | Status: AC
  Administered 2019-07-21: 2 g via INTRAVENOUS
  Filled 2019-07-21: qty 2

## 2019-07-21 MED ORDER — VANCOMYCIN HCL IN DEXTROSE 1-5 GM/200ML-% IV SOLN
1000.0000 mg | Freq: Once | INTRAVENOUS | Status: AC
  Administered 2019-07-21: 1000 mg via INTRAVENOUS
  Filled 2019-07-21: qty 200

## 2019-07-21 MED ORDER — SODIUM CHLORIDE 0.9 % IV SOLN
2.0000 g | Freq: Two times a day (BID) | INTRAVENOUS | Status: DC
  Administered 2019-07-22: 09:00:00 2 g via INTRAVENOUS
  Filled 2019-07-21 (×2): qty 2

## 2019-07-21 MED ORDER — SODIUM CHLORIDE 0.9 % IV SOLN
INTRAVENOUS | Status: AC
  Administered 2019-07-21: 23:00:00 via INTRAVENOUS

## 2019-07-21 MED ORDER — INSULIN ASPART 100 UNIT/ML ~~LOC~~ SOLN
0.0000 [IU] | Freq: Three times a day (TID) | SUBCUTANEOUS | Status: DC
  Administered 2019-07-22 – 2019-07-23 (×5): 1 [IU] via SUBCUTANEOUS
  Filled 2019-07-21 (×6): qty 1

## 2019-07-21 MED ORDER — VANCOMYCIN HCL IN DEXTROSE 1-5 GM/200ML-% IV SOLN
1000.0000 mg | Freq: Once | INTRAVENOUS | Status: DC
  Filled 2019-07-21: qty 200

## 2019-07-21 MED ORDER — ACETAMINOPHEN 650 MG RE SUPP: 650.0000 mg | Freq: Four times a day (QID) | RECTAL | Status: DC | PRN

## 2019-07-21 MED ORDER — PREGABALIN 75 MG PO CAPS
150.0000 mg | ORAL_CAPSULE | Freq: Two times a day (BID) | ORAL | Status: DC
  Administered 2019-07-21 – 2019-07-24 (×6): 150 mg via ORAL
  Filled 2019-07-21 (×6): qty 2

## 2019-07-21 MED ORDER — ONDANSETRON HCL 4 MG PO TABS: 4.0000 mg | ORAL_TABLET | Freq: Four times a day (QID) | ORAL | Status: DC | PRN

## 2019-07-21 MED ORDER — VANCOMYCIN HCL IN DEXTROSE 1-5 GM/200ML-% IV SOLN
1000.0000 mg | Freq: Once | INTRAVENOUS | Status: AC
  Administered 2019-07-21: 1000 mg via INTRAVENOUS

## 2019-07-21 MED ORDER — ACETAMINOPHEN 325 MG PO TABS
650.0000 mg | ORAL_TABLET | Freq: Four times a day (QID) | ORAL | Status: DC | PRN
  Administered 2019-07-21 – 2019-07-23 (×2): 650 mg via ORAL
  Filled 2019-07-21 (×2): qty 2

## 2019-07-21 MED ORDER — ENOXAPARIN SODIUM 40 MG/0.4ML ~~LOC~~ SOLN
40.0000 mg | SUBCUTANEOUS | Status: DC
  Administered 2019-07-21 – 2019-07-23 (×3): 40 mg via SUBCUTANEOUS
  Filled 2019-07-21 (×3): qty 0.4

## 2019-07-21 MED ORDER — ROPINIROLE HCL 1 MG PO TABS
1.0000 mg | ORAL_TABLET | Freq: Every day | ORAL | Status: DC
  Administered 2019-07-21 – 2019-07-23 (×3): 1 mg via ORAL
  Filled 2019-07-21 (×3): qty 1

## 2019-07-21 NOTE — ED Triage Notes (Signed)
Pt arrives via [facility] from home after an episode of feeling hot, dizzy and vomting, she has also had diarrhea. A&Ox4 at this time, NAD

## 2019-07-21 NOTE — ED Provider Notes (Signed)
[facility] Emergency Department Provider Note   ____________________________________________   First MD Initiated Contact with Patient 07/21/19 1725     (approximate)  I have reviewed the triage vital signs and the nursing notes.   HISTORY  Chief Complaint Emesis  Who reports that she began experiencing feeling of dizziness and lightheadedness  She feels very fatigued  HPI Lynn Potter is a 73 y.o. female history of A. fib, hypertension diabetes  Patient reports earlier today a little bit before arrival she was out sitting in her car listening to the radio when she started noticed that she was feeling very fatigued.  She is felt a bit tired, but started to feel very generally weak.  Became somewhat nauseated, threw up once nonblack nonbloody.  She reports she has had this in the past where she was very dehydrated and reports at that time it was due to a very high blood sugar  Denies fevers or chills.  No known exposure to Covid.  She reports she was tested negative a few weeks ago  No chest pain.  No weakness in any arm or leg.  No facial droop.  She also reports she just had a mild feeling of achiness and fatigue off and on for at least the last 2 to 3 days but had an early month thought of it  Denies pain or burning with urination   Past Medical History:  Diagnosis Date   AF (atrial fibrillation) (HCC)    AKI (acute kidney injury) ([place]) 07/21/2019   Arthritis    Asthma    Chicken pox    Diabetes mellitus without complication (HCC)    Glaucoma    Hemorrhoids    History of hiatal hernia    Hypertension    Migraines    Obesity    Opiate use 03/16/2017   Personal history of tobacco use, presenting hazards to health 06/18/2015   Sleep apnea     Patient Active Problem List   Diagnosis Date Noted   SIRS (systemic inflammatory response syndrome) ([place]) 07/21/2019   HTN (hypertension) 07/21/2019   Diabetes ([place]) 07/21/2019    OSA (obstructive sleep apnea) 07/21/2019   AKI (acute kidney injury) ([place]) 07/21/2019   Microcytic anemia 07/05/2019   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   Preop testing 03/03/2019   Abnormal MRI, lumbar spine (09/26/18) 12/15/2018   Spondylosis without myelopathy or radiculopathy, lumbosacral region 12/02/2018   Chronic low back pain (Primary area of Pain) (Bilateral) (L>R) w/o sciatica 12/02/2018   Abnormal CT of  lumbar spine (10/11/2018) 12/02/2018   Lumbar facet hypertrophy (Multilevel) 12/02/2018   DDD (degenerative disc disease), lumbar 12/02/2018   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), w/ neurogenic claudication 12/02/2018   Osteoarthritis of facet joint of lumbar spine 12/02/2018   Osteoarthritis of lumbar spine 12/02/2018   Lumbar spondylosis 12/02/2018   Chronic hip pain (Left) 11/29/2018   Osteoarthritis of hip (Left) 11/29/2018   Lumbar facet syndrome (Bilateral) (L>R) 11/29/2018   Vitamin B12 deficiency 11/29/2018   Elevated C-reactive protein (CRP) 11/15/2018   Elevated sed rate 11/15/2018   Chronic low back pain (Primary area of Pain) (Bilateral) (L>R) w/ sciatica (Left) 11/09/2018   Chronic lower extremity pain (Secondary Area of Pain) (Left) 11/09/2018   Chronic shoulder pain [facility] Area of Pain) (Bilateral) (R>L) 11/09/2018   Pharmacologic therapy 11/09/2018   Disorder of skeletal system 11/09/2018   Problems influencing health status 11/09/2018  Acute diverticulitis 05/23/2018   Hyperglycemia 07/28/2017   Vitamin D insufficiency 03/25/2017   Chronic pain syndrome 03/16/2017   Encounter for long-term opiate analgesic use 03/16/2017   Long term current use of opiate analgesic 03/16/2017   Opiate use 03/16/2017   Degenerative joint disease (DJD) of hip 03/16/2017   Sacroiliac joint pain 03/16/2017   Sepsis ([place]) 09/30/2016   Osteoarthritis of shoulder  (Right) 10/17/2015   Personal history of tobacco use, presenting hazards to health 06/18/2015   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   Rotator cuff tendinitis 03/10/2014   History of rotator cuff surgery (Bilateral) 03/10/2014    Past Surgical History:  Procedure Laterality Date   ABDOMINAL HYSTERECTOMY     BACK SURGERY     carpal tunnell     CATARACT EXTRACTION     CHOLECYSTECTOMY     COLONOSCOPY WITH ESOPHAGOGASTRODUODENOSCOPY (EGD)     ESOPHAGOGASTRODUODENOSCOPY (EGD) WITH PROPOFOL N/A 02/16/2017   Procedure: ESOPHAGOGASTRODUODENOSCOPY (EGD) WITH PROPOFOL;  Surgeon: Manya Silvas, MD;  Location: [facility] ENDOSCOPY;  Service: Endoscopy;  Laterality: N/A;   EYE SURGERY     JOINT REPLACEMENT     LUMBAR FUSION     TOTAL KNEE ARTHROPLASTY     UPPER ESOPHAGEAL ENDOSCOPIC ULTRASOUND (EUS) N/A 01/03/2016   Procedure: UPPER ESOPHAGEAL ENDOSCOPIC ULTRASOUND (EUS);  Surgeon: Holly Bodily, MD;  Location: [facility] ENDOSCOPY;  Service: Gastroenterology;  Laterality: N/A;    Prior to Admission medications   Medication Sig Start Date End Date Taking? Authorizing Provider  amLODipine (NORVASC) 10 MG tablet Take 10 mg by mouth daily. 05/24/18  Yes [provider]  Ascorbic Acid (VITAMIN C) 1000 MG tablet Take 1,000 mg by mouth daily.   Yes [provider]  aspirin 81 MG tablet Take 81 mg by mouth daily.   Yes [provider]  atorvastatin (LIPITOR) 20 MG tablet Take 20 mg by mouth daily. 04/25/19  Yes [provider]  azelastine (ASTELIN) 0.1 % nasal spray Place 1 spray into both nostrils 2 (two) times daily as needed for rhinitis or allergies.  03/12/18  Yes [provider]  baclofen (LIORESAL) 10 MG tablet 10 mg 2 (two) times daily.  10/21/18  Yes [provider]  Cholecalciferol (D-5000) 5000 units TABS Take 5,000 Units by mouth daily.    Yes [provider]  COMBIVENT RESPIMAT 20-100 MCG/ACT AERS respimat  USE 1 VIAL VIA NEBULIZER 4 TIMES A DAY AS NEEDED 12/13/18  Yes [provider]  ferrous sulfate 325 (65 FE) MG EC tablet Take 325 mg by mouth 2 (two) times daily.    Yes [provider]  insulin lispro (HUMALOG KWIKPEN) 100 UNIT/ML KwikPen Inject 3 Units into the skin 3 (three) times daily.  08/31/18  Yes [provider]  JARDIANCE 25 MG TABS tablet Take 25 mg by mouth daily.  03/25/18  Yes [provider]  KLOR-CON M10 10 MEQ tablet Take 10 mEq by mouth daily. 12/13/18  Yes [provider]  latanoprost (XALATAN) 0.005 % ophthalmic solution Place 1 drop into both eyes at bedtime.    Yes [provider]  metFORMIN (GLUCOPHAGE) 500 MG tablet Take 1 tablet (500 mg total) by mouth 2 (two) times daily with a meal. 07/31/17  Yes Epifanio Lesches, MD  methocarbamol (ROBAXIN) 500 MG tablet Take 500 mg by mouth 3 (three) times daily.   Yes [provider]  olmesartan-hydrochlorothiazide (BENICAR HCT) 40-25 MG tablet Take 1 tablet by mouth daily. 03/17/18  Yes [provider]  pantoprazole (PROTONIX) 40 MG tablet Take 1 tablet by mouth 2 (two) times daily.    Yes [provider]  pregabalin (LYRICA) 150 MG capsule Take 150 mg by mouth 2 (two) times daily.    Yes [provider]  rOPINIRole (REQUIP) 1 MG tablet TAKE 1 TABLET BY MOUTH EVERYDAY AT BEDTIME 11/11/18  Yes [provider]  ACCU-CHEK AVIVA PLUS test strip  07/07/18   [provider]  ACCU-CHEK SOFTCLIX LANCETS lancets 2 (two) times daily. 07/16/18   [provider]  atorvastatin (LIPITOR) 40 MG tablet Take 1 tablet (40 mg total) by mouth daily. 05/25/18 03/17/19  Max Sane, MD  Blood Glucose Monitoring Suppl (ACCU-CHEK AVIVA PLUS) w/Device KIT EVERY DAY 07/16/18   [provider]  CHANTIX 0.5 MG tablet  01/18/19   [provider]  fluconazole (DIFLUCAN) 150 MG tablet Take 150 mg by mouth daily. 06/03/18   [provider]  hydrOXYzine (VISTARIL) 25 MG capsule Take 25 mg by mouth every 12 (twelve) hours.  03/22/18   [provider]  insulin aspart (NOVOLOG) 100 UNIT/ML injection Inject 0-9 Units into the skin 3 (three) times daily with meals. Patient not taking: Reported on 07/21/2019 07/31/17   Epifanio Lesches, MD  LEVEMIR FLEXTOUCH 100 UNIT/ML Pen Inject 25 Units into the skin daily. Patient reports she only takes Levemir if her glucose is >150 mg/dl which is rare for her 03/18/18   [provider]    Allergies Patient has no known allergies.  Family History  Problem Relation Age of Onset   Diabetes Mother    Diabetes Sister     Social History Social History   Tobacco Use   Smoking status: Current Every Day Smoker    Packs/day: 1.00    Types: Cigarettes   Smokeless tobacco: Never Used  Substance Use Topics   Alcohol use: No   Drug use: No    Review of Systems Constitutional: No fever/chills but feeling fatigued and just generally unwell little bit off the last few days Eyes: No visual changes. ENT: No sore throat.  Feels very thirsty and dry. Cardiovascular: Denies chest pain. Respiratory: Denies shortness of breath. Gastrointestinal: No abdominal pain.  Vomited once but denies having pain or any ongoing nausea. Genitourinary: Negative for dysuria.  Had one loose stool Musculoskeletal: Negative for back pain. Skin: Negative for rash. Neurological: Negative for headaches, areas of focal weakness or numbness.    ____________________________________________   PHYSICAL EXAM:  VITAL SIGNS: ED Triage Vitals  Enc Vitals Group     BP 07/21/19 1722 (!) 150/108     Pulse Rate 07/21/19 1722 77     Resp 07/21/19 1722 18     Temp 07/21/19 1722 98.2 F (36.8 C)     Temp Source 07/21/19 1722 Oral     SpO2 07/21/19 1722 96 %     Weight 07/21/19 1710 205 lb 0.4 oz (93 kg)     Height 07/21/19 1710 5' 4"  (1.626 m)     Head Circumference --      Peak  Flow --      Pain Score 07/21/19 1709 4     Pain Loc --      Pain Edu? --      Excl. in [place]? --     Constitutional: Alert and oriented.  Generally fatigued and feels her looks just slightly pale but no acute extremities.  She is able to stand and ambulates to the commode but appears  quite fatigued afterward, encourage the patient to remain in bed and she is comfortable with that plan. Eyes: Conjunctivae are normal. Head: Atraumatic. Nose: No congestion/rhinnorhea. Mouth/Throat: Mucous membranes are dry. Neck: No stridor.  Cardiovascular: Normal rate, regular rhythm. Grossly normal heart sounds.  Good peripheral circulation. Respiratory: Normal respiratory effort.  No retractions. Lungs CTAB. Gastrointestinal: Soft and nontender. No distention. Musculoskeletal: No lower extremity tenderness nor edema. Neurologic:  Normal speech and language. No gross focal neurologic deficits are appreciated.  Generally fatigued, but no focal deficits.  Equal grip strength and movement of all extremities.  No facial droop. Skin:  Skin is warm, dry and intact. No rash noted. Psychiatric: Mood and affect are normal. Speech and behavior are normal.  ____________________________________________   LABS (all labs ordered are listed, but only abnormal results are displayed)  Labs Reviewed  GLUCOSE, CAPILLARY - Abnormal; Notable for the following components:      Result Value   Glucose-Capillary 124 (*)    All other components within normal limits  CBC - Abnormal; Notable for the following components:   WBC 17.0 (*)    Hemoglobin 9.7 (*)    HCT 32.6 (*)    MCV 72.0 (*)    MCH 21.4 (*)    MCHC 29.8 (*)    RDW 16.8 (*)    All other components within normal limits  BASIC METABOLIC PANEL - Abnormal; Notable for the following components:   Potassium 3.2 (*)    Glucose, Bld 116 (*)    Creatinine, Ser 1.21 (*)    GFR calc non Af Amer 44 (*)    GFR calc Af Amer 51 (*)    All other components within normal  limits  LACTIC ACID, PLASMA - Abnormal; Notable for the following components:   Lactic Acid, Venous 3.3 (*)    All other components within normal limits  URINALYSIS, COMPLETE (UACMP) WITH MICROSCOPIC - Abnormal; Notable for the following components:   Color, Urine YELLOW (*)    APPearance CLEAR (*)    Glucose, UA >=500 (*)    Leukocytes,Ua TRACE (*)    All other components within normal limits  CULTURE, BLOOD (ROUTINE X 2)  CULTURE, BLOOD (ROUTINE X 2)  SARS CORONAVIRUS 2 (TAT 6-24 HRS)  PROCALCITONIN  LACTIC ACID, PLASMA  TROPONIN I (HIGH SENSITIVITY)   ____________________________________________  EKG  Reviewed enterotomy at 1810 Heart rate 80 QRS 100 QTC 420 Normal sinus rhythm, biphasic T waves notable in V2.  Possibly indicative of ischemia, no ST elevation.  T wave biphasic in lead I as well. ____________________________________________  RADIOLOGY  Dg Chest Port 1 View  Result Date: 07/21/2019 CLINICAL DATA:  Dyspnea EXAM: PORTABLE CHEST 1 VIEW COMPARISON:  05/23/2018 FINDINGS: The heart size and mediastinal contours are stable. Mild pulmonary vascular congestion. No focal airspace consolidation. No pleural effusion or pneumothorax. IMPRESSION: Cardiomegaly with mild pulmonary vascular congestion. Electronically Signed   By: Davina Poke M.D.   On: 07/21/2019 19:17     ____________________________________________   PROCEDURES  Procedure(s) performed: None  Procedures  Critical Care performed: Yes, see critical care note(s)  CRITICAL CARE Performed by: Delman Kitten   Total critical care time: 35 minutes  Critical care time was exclusive of separately billable procedures and treating other patients.  Critical care was necessary to treat or prevent imminent or life-threatening deterioration.  Critical care was time spent personally by me on the following activities: development of treatment plan with patient and/or surrogate as well as nursing,  discussions  with consultants, evaluation of patient's response to treatment, examination of patient, obtaining history from patient or surrogate, ordering and performing treatments and interventions, ordering and review of laboratory studies, ordering and review of radiographic studies, pulse oximetry and re-evaluation of patient's condition.  Patient with notable hypotension blood pressure in the mid 60s, responsive to fluid bolusing.  ____________________________________________   INITIAL IMPRESSION / ASSESSMENT AND PLAN / ED COURSE  Pertinent labs & imaging results that were available during my care of the patient were reviewed by me and considered in my medical decision making (see chart for details).   Patient returns for evaluation of not feeling too well for last couple days having some just slight symptoms of fatigue may be little achiness, but today had pretty significant worsening while in her car started feeling very nauseated and threw up once.  She appears generally ill but no focal deficits.  No acute distress.  After being in the ER briefly, her blood pressure was noted to drop into the 60s which we affirmed in both arms and cuff repositioning as well and she felt quite fatigued and symptomatic during that period.  She is responded well to fluids, her white count is notably elevated and given her history and presentation have not yet identified exact cause but I am concerned this could represent severe sepsis with her elevated white count and symptomatology over the last couple days.  Source evaluation pending, patient was dry on and out cath will reattempt later.  Responding well to fluid bolusing.  No chest pain, no focal neurologic deficits,  Clinical Course as of Jul 20 2213  Thu Jul 21, 2019  1732 Patient currently up to commode with walker.   [MQ]  4132 Confirmed with repositioning in both arms, patient hypotensive, blood pressure in the mid 44W systolic.  IV fluid bolus  started, patient alert oriented without focal deficit, reports she just does not feel well very fatigued and generally weak.  Afebrile, denying any recent illness.  Etiology of hypotension not yet clear, awaiting EKG and further testing.   [MQ]  1920 Patient improved. BP much improved. She looks and appears notably better.    [MQ]  1925 None discontinuing 30 mL/kg fluid bolus as patient demonstrates cardiomegaly and possible early signs of vascular congestion on chest x-ray.  Concern for significant potential for volume overload, and the patient clinically has much improved   [MQ]    Clinical Course User Index [MQ] Delman Kitten, MD    Patient continues to rest comfortably, now admitted to the hospitalist service.  Further work-up,  JANESIA JOSWICK was evaluated in Emergency Department on 07/21/2019 for the symptoms described in the history of present illness. She was evaluated in the context of the global COVID-19 pandemic, which necessitated consideration that the patient might be at risk for infection with the SARS-CoV-2 virus that causes COVID-19. Institutional protocols and algorithms that pertain to the evaluation of patients at risk for COVID-19 are in a state of rapid change based on information released by regulatory bodies including the CDC and federal and state organizations. These policies and algorithms were followed during the patient's care in the ED.   ____________________________________________   FINAL CLINICAL IMPRESSION(S) / ED DIAGNOSES  Final diagnoses:  General weakness  Severe hypotension  SIRS (systemic inflammatory response syndrome) ([place])        Note:  This document was prepared using Dragon voice recognition software and may include unintentional dictation errors       Hina Gupta,  [place]ta [place], MD 07/21/19 2215

## 2019-07-21 NOTE — ED Notes (Signed)
BS 124

## 2019-07-21 NOTE — ED Notes (Signed)
ED TO INPATIENT HANDOFF REPORT  ED Nurse Name and Phone #: Johny Shock [phone]  S Name/Age/Gender Lynn Potter 73 y.o. female Room/Bed: ED25A/ED25A  Code Status   Code Status: Prior  Home/SNF/Other Home Patient oriented to: self, place, time and situation Is this baseline? Yes   Triage Complete: Triage complete  Chief Complaint weakness  Triage Note Pt arrives via [facility] from home after an episode of feeling hot, dizzy and vomting, she has also had diarrhea. A&Ox4 at this time, NAD   Allergies No Known Allergies  Level of Care/Admitting Diagnosis ED Disposition    ED Disposition Condition [facility] Area:  [100120]  Level of Care: Med-Surg [16]  Covid Evaluation: Asymptomatic Screening Protocol (No Symptoms)  Diagnosis: SIRS (systemic inflammatory response syndrome) [facility]) [id number]  Admitting Physician: Lance Coon [id number]  Attending Physician: Jannifer Franklin, DAVID [phone]  PT Class (Do Not Modify): Observation [104]  PT Acc Code (Do Not Modify): Observation [10022]       B Medical/Surgery History Past Medical History:  Diagnosis Date  . AF (atrial fibrillation) ([place])   . AKI (acute kidney injury) ([place]) 07/21/2019  . Arthritis   . Asthma   . Chicken pox   . Diabetes mellitus without complication ([place])   . Glaucoma   . Hemorrhoids   . History of hiatal hernia   . Hypertension   . Migraines   . Obesity   . Opiate use 03/16/2017  . Personal history of tobacco use, presenting hazards to health 06/18/2015  . Sleep apnea    Past Surgical History:  Procedure Laterality Date  . ABDOMINAL HYSTERECTOMY    . BACK SURGERY    . carpal tunnell    . CATARACT EXTRACTION    . CHOLECYSTECTOMY    . COLONOSCOPY WITH ESOPHAGOGASTRODUODENOSCOPY (EGD)    . ESOPHAGOGASTRODUODENOSCOPY (EGD) WITH PROPOFOL N/A 02/16/2017   Procedure: ESOPHAGOGASTRODUODENOSCOPY (EGD) WITH PROPOFOL;  Surgeon: Manya Silvas, MD;  [facility]  END[facility];  Service: Endoscopy;  Laterality: N/A;  . EYE SURGERY    . JOINT REPLACEMENT    . LUMBAR FUSION    . TOTAL KNEE ARTHROPLASTY    . UPPER ESOPHAGEAL ENDOSCOPIC ULTRASOUND (EUS) N/A 01/03/2016   Procedure: UPPER ESOPHAGEAL ENDOSCOPIC ULTRASOUND (EUS);  Surgeon: Holly Bodily, MD;  Location[facility] ENDOSCOPY;  Service: Gastroenterology;  Laterality: N/A;     A IV Location/Drains/Wounds Patient Lines/Drains/Airways Status   Active Line/Drains/Airways    Name:   Placement date:   Placement time:   Site:   Days:   Peripheral IV 07/21/19 Right Hand   07/21/19    1755    Hand   less than 1   Peripheral IV 07/21/19 Left Arm   07/21/19    1831    Arm   less than 1   Epidural Catheter 12/21/18   12/21/18    1018     212          Intake/Output Last 24 hours No intake or output data in the 24 hours ending 07/21/19 2149  Labs/Imaging Results for orders placed or performed during the hospital encounter of 07/21/19 (from the past 48 hour(s))  Glucose, capillary     Status: Abnormal   Collection Time: 07/21/19  5:43 PM  Result Value Ref Range   Glucose-Capillary 124 (H) 70 - 99 mg/dL  CBC     Status: Abnormal   Collection Time: 07/21/19  6:00 PM  Result Value Ref Range   WBC  17.0 (H) 4.0 - 10.5 K/uL   RBC 4.53 3.87 - 5.11 MIL/uL   Hemoglobin 9.7 (L) 12.0 - 15.0 g/dL   HCT 32.6 (L) 36.0 - 46.0 %   MCV 72.0 (L) 80.0 - 100.0 fL   MCH 21.4 (L) 26.0 - 34.0 pg   MCHC 29.8 (L) 30.0 - 36.0 g/dL   RDW 16.8 (H) 11.5 - 15.5 %   Platelets 388 150 - 400 K/uL   nRBC 0.0 0.0 - 0.2 %    Comment: Performed at [facility], [address] [place], [place] [id number]  Basic metabolic panel     Status: Abnormal   Collection Time: 07/21/19  6:00 PM  Result Value Ref Range   Sodium 135 135 - 145 mmol/L   Potassium 3.2 (L) 3.5 - 5.1 mmol/L   Chloride 102 98 - 111 mmol/L   CO2 24 22 - 32 mmol/L   Glucose, Bld 116 (H) 70 - 99 mg/dL   BUN 22 8 - 23 mg/dL   Creatinine, Ser 1.21 (H)  0.44 - 1.00 mg/dL   Calcium 9.8 8.9 - 10.3 mg/dL   GFR calc non Af Amer 44 (L) >60 mL/min   GFR calc Af Amer 51 (L) >60 mL/min   Anion gap 9 5 - 15    Comment: Performed at [facility], [address] [place], [place] [address]  Procalcitonin     Status: None   Collection Time: 07/21/19  6:00 PM  Result Value Ref Range   Procalcitonin <0.10 ng/mL    Comment:        Interpretation: PCT (Procalcitonin) <= 0.5 ng/mL: Systemic infection (sepsis) is not likely. Local bacterial infection is possible. (NOTE)       Sepsis PCT Algorithm           Lower Respiratory Tract                                      Infection PCT Algorithm    ----------------------------     ----------------------------         PCT < 0.25 ng/mL                PCT < 0.10 ng/mL         Strongly encourage             Strongly discourage   discontinuation of antibiotics    initiation of antibiotics    ----------------------------     -----------------------------       PCT 0.25 - 0.50 ng/mL            PCT 0.10 - 0.25 ng/mL               OR       >80% decrease in PCT            Discourage initiation of                                            antibiotics      Encourage discontinuation           of antibiotics    ----------------------------     -----------------------------         PCT >= 0.50 ng/mL  PCT 0.26 - 0.50 ng/mL               AND        <80% decrease in PCT             Encourage initiation of                                             antibiotics       Encourage continuation           of antibiotics    ----------------------------     -----------------------------        PCT >= 0.50 ng/mL                  PCT > 0.50 ng/mL               AND         increase in PCT                  Strongly encourage                                      initiation of antibiotics    Strongly encourage escalation           of antibiotics                                      -----------------------------                                           PCT <= 0.25 ng/mL                                                 OR                                        > 80% decrease in PCT                                     Discontinue / Do not initiate                                             antibiotics Performed at [facility], [address], [place] [address]   Troponin I (High Sensitivity)     Status: None   Collection Time: 07/21/19  6:00 PM  Result Value Ref Range   Troponin I (High Sensitivity) 3 <18 ng/L    Comment: (NOTE) Elevated high sensitivity troponin I (hsTnI) values and significant  changes across serial measurements may suggest ACS but many other  chronic and acute conditions are known to elevate hsTnI results.  Refer to the "Links" section for chest pain algorithms and additional  guidance. Performed at [facility], [address] [place], [place] [address]   Lactic acid, plasma     Status: Abnormal   Collection Time: 07/21/19  6:24 PM  Result Value Ref Range   Lactic Acid, Venous 3.3 (HH) 0.5 - 1.9 mmol/L    Comment: CRITICAL RESULT CALLED TO, READ BACK BY AND VERIFIED WITH KALA KEEN 07/21/19 1852 KLW Performed at [facility], [address] [place], [place] [address]   Urinalysis, Complete w Microscopic     Status: Abnormal   Collection Time: 07/21/19  8:43 PM  Result Value Ref Range   Color, Urine YELLOW (A) YELLOW   APPearance CLEAR (A) CLEAR   Specific Gravity, Urine 1.009 1.005 - 1.030   pH 6.0 5.0 - 8.0   Glucose, UA >=500 (A) NEGATIVE mg/dL   Hgb urine dipstick NEGATIVE NEGATIVE   Bilirubin Urine NEGATIVE NEGATIVE   Ketones, ur NEGATIVE NEGATIVE mg/dL   Protein, ur NEGATIVE NEGATIVE mg/dL   Nitrite NEGATIVE NEGATIVE   Leukocytes,Ua TRACE (A) NEGATIVE   WBC, UA 0-5 0 - 5 WBC/hpf   Bacteria, UA NONE SEEN NONE SEEN   Squamous Epithelial / LPF 0-5 0 - 5   Mucus PRESENT    Hyaline  Casts, UA PRESENT     Comment: Performed at [facility], [address] [place], [place] [address]   Dg Chest Port 1 View  Result Date: 07/21/2019 CLINICAL DATA:  Dyspnea EXAM: PORTABLE CHEST 1 VIEW COMPARISON:  05/23/2018 FINDINGS: The heart size and mediastinal contours are stable. Mild pulmonary vascular congestion. No focal airspace consolidation. No pleural effusion or pneumothorax. IMPRESSION: Cardiomegaly with mild pulmonary vascular congestion. Electronically Signed   By: Davina Poke M.D.   On: 07/21/2019 19:17    Pending Labs Unresulted Labs (From admission, onward)    Start     Ordered   07/21/19 1846  SARS CORONAVIRUS 2 (TAT 6-24 HRS) Nasopharyngeal Nasopharyngeal Swab  (Asymptomatic/Tier 2 Patients Labs)  ONCE - STAT,   STAT    Question Answer Comment  Is this test for diagnosis or screening Screening   Symptomatic for COVID-19 as defined by CDC No   Hospitalized for COVID-19 No   Admitted to ICU for COVID-19 No   Previously tested for COVID-19 Yes   Resident in a congregate (group) care setting Unknown   Employed in healthcare setting No   Pregnant No      07/21/19 1845   07/21/19 1820  Blood Culture (routine x 2)  BLOOD CULTURE X 2,   STAT     07/21/19 1819   07/21/19 1819  Lactic acid, plasma  Now then every 2 hours,   STAT     07/21/19 1819   Signed and Held  Hemoglobin A1c  Once,   R    Comments: To assess prior glycemic control    Signed and Held   Signed and Held  CBC  (enoxaparin (LOVENOX)    CrCl >/= 30 ml/min)  Once,   R    Comments: Baseline for enoxaparin therapy IF NOT ALREADY DRAWN.  Notify MD if PLT < 100 K.    Signed and Held   Signed and Held  Creatinine, serum  (enoxaparin (LOVENOX)    CrCl >/= 30 ml/min)  Once,   R    Comments: Baseline for enoxaparin therapy IF NOT ALREADY DRAWN.    Signed and Held   Signed and  Held  Creatinine, serum  (enoxaparin (LOVENOX)    CrCl >/= 30 ml/min)  Weekly,   R    Comments: while on enoxaparin  therapy    Signed and Held   Signed and Held  Basic metabolic panel  Tomorrow morning,   R     Signed and Held   Signed and Held  CBC  Tomorrow morning,   R     Signed and Held          Vitals/Pain Today's Vitals   07/21/19 1722 07/21/19 1800 07/21/19 1900 07/21/19 2030  BP: (!) 150/108 (!) 93/56 (!) 95/50 106/61  Pulse: 77 83 64 (!) 56  Resp: 18  16 11   Temp: 98.2 F (36.8 C)     TempSrc: Oral     SpO2: 96% 100% 98% 97%  Weight:      Height:      PainSc:        Isolation Precautions No active isolations  Medications Medications  vancomycin (VANCOCIN) IVPB 1000 mg/200 mL premix (has no administration in time range)  ceFEPIme (MAXIPIME) 2 g in sodium chloride 0.9 % 100 mL IVPB (has no administration in time range)  sodium chloride 0.9 % bolus 1,000 mL (0 mLs Intravenous Stopped 07/21/19 2038)  sodium chloride 0.9 % bolus 1,000 mL (0 mLs Intravenous Stopped 07/21/19 2038)  ceFEPIme (MAXIPIME) 2 g in sodium chloride 0.9 % 100 mL IVPB (0 g Intravenous Stopped 07/21/19 2003)  metroNIDAZOLE (FLAGYL) IVPB 500 mg (0 mg Intravenous Stopped 07/21/19 2038)  vancomycin (VANCOCIN) IVPB 1000 mg/200 mL premix (0 mg Intravenous Stopped 07/21/19 2140)    Mobility walks with device High fall risk   Focused Assessments Pulmonary Assessment Handoff:  Lung sounds:   O2 Device: Room Air        R Recommendations: See Admitting Provider Note  Report given to:   Additional Notes: Pt's daughter is @ [facility].

## 2019-07-21 NOTE — Progress Notes (Signed)
PHARMACY -  BRIEF ANTIBIOTIC NOTE   Pharmacy has received consult(s) for vancomycin and cefepime from an ED provider.  The patient's profile has been reviewed for ht/wt/allergies/indication/available labs.    One time order(s) placed for vanc 1 g + 1 g (total 2 g LD) + cefepime 2 g  Further antibiotics/pharmacy consults should be ordered by admitting physician if indicated.                       Thank you,  Tawnya Crook, PharmD 07/21/2019  6:51 PM

## 2019-07-21 NOTE — H&P (Signed)
Lynn Potter at [place] NAME: Lynn Potter    MR#:  [id number]  DATE OF BIRTH:  1946-06-20  DATE OF ADMISSION:  07/21/2019  PRIMARY CARE PHYSICIAN: Remi Haggard, FNP   REQUESTING/REFERRING PHYSICIAN: Jacqualine Code, MD  CHIEF COMPLAINT:   Chief Complaint  Patient presents with  . Emesis    HISTORY OF PRESENT ILLNESS:  Lynn Potter  is a 73 y.o. female who presents with chief complaint as above.  Patient presents to the ED with a complaint of an episode of emesis, with some weakness.  She states that this occurred today while she was her car.  She started to feel tired and weak, and then vomited.  She says this is happened to her before when she had a very elevated glucose.  On evaluation in the ED tonight her glucose was only mildly elevated.  She did meet sirs criteria initially, and was profoundly hypotensive.  Her blood pressure corrected quickly with IV fluids.  Infectious work-up thus far is largely unrevealing.  Hospitalist were called for admission and further evaluation  PAST MEDICAL HISTORY:   Past Medical History:  Diagnosis Date  . AF (atrial fibrillation) ([place])   . AKI (acute kidney injury) ([place]) 07/21/2019  . Arthritis   . Asthma   . Chicken pox   . Diabetes mellitus without complication ([place])   . Glaucoma   . Hemorrhoids   . History of hiatal hernia   . Hypertension   . Migraines   . Obesity   . Opiate use 03/16/2017  . Personal history of tobacco use, presenting hazards to health 06/18/2015  . Sleep apnea      PAST SURGICAL HISTORY:   Past Surgical History:  Procedure Laterality Date  . ABDOMINAL HYSTERECTOMY    . BACK SURGERY    . carpal tunnell    . CATARACT EXTRACTION    . CHOLECYSTECTOMY    . COLONOSCOPY WITH ESOPHAGOGASTRODUODENOSCOPY (EGD)    . ESOPHAGOGASTRODUODENOSCOPY (EGD) WITH PROPOFOL N/A 02/16/2017   Procedure: ESOPHAGOGASTRODUODENOSCOPY (EGD) WITH PROPOFOL;  Surgeon: Manya Silvas, MD;   Location[facility] ENDOSCOPY;  Service: Endoscopy;  Laterality: N/A;  . EYE SURGERY    . JOINT REPLACEMENT    . LUMBAR FUSION    . TOTAL KNEE ARTHROPLASTY    . UPPER ESOPHAGEAL ENDOSCOPIC ULTRASOUND (EUS) N/A 01/03/2016   Procedure: UPPER ESOPHAGEAL ENDOSCOPIC ULTRASOUND (EUS);  Surgeon: Holly Bodily, MD;  Location: [facility] ENDOSCOPY;  Service: Gastroenterology;  Laterality: N/A;     SOCIAL HISTORY:   Social History   Tobacco Use  . Smoking status: Current Every Day Smoker    Packs/day: 1.00    Types: Cigarettes  . Smokeless tobacco: Never Used  Substance Use Topics  . Alcohol use: No     FAMILY HISTORY:   Family History  Problem Relation Age of Onset  . Diabetes Mother   . Diabetes Sister      DRUG ALLERGIES:  No Known Allergies  MEDICATIONS AT HOME:   Prior to Admission medications   Medication Sig Start Date End Date Taking? Authorizing Provider  amLODipine (NORVASC) 10 MG tablet Take 10 mg by mouth daily. 05/24/18  Yes [provider]  Ascorbic Acid (VITAMIN C) 1000 MG tablet Take 1,000 mg by mouth daily.   Yes [provider]  aspirin 81 MG tablet Take 81 mg by mouth daily.   Yes [provider]  atorvastatin (LIPITOR) 20 MG tablet Take 20 mg by mouth  daily. 04/25/19  Yes [provider]  azelastine (ASTELIN) 0.1 % nasal spray Place 1 spray into both nostrils 2 (two) times daily as needed for rhinitis or allergies.  03/12/18  Yes [provider]  baclofen (LIORESAL) 10 MG tablet 10 mg 2 (two) times daily.  10/21/18  Yes [provider]  Cholecalciferol (D-5000) 5000 units TABS Take 5,000 Units by mouth daily.    Yes [provider]  COMBIVENT RESPIMAT 20-100 MCG/ACT AERS respimat USE 1 VIAL VIA NEBULIZER 4 TIMES A DAY AS NEEDED 12/13/18  Yes [provider]  ferrous sulfate 325 (65 FE) MG EC tablet Take 325 mg by mouth 2 (two) times daily.    Yes [provider]  insulin lispro (HUMALOG  KWIKPEN) 100 UNIT/ML KwikPen Inject 3 Units into the skin 3 (three) times daily.  08/31/18  Yes [provider]  JARDIANCE 25 MG TABS tablet Take 25 mg by mouth daily.  03/25/18  Yes [provider]  KLOR-CON M10 10 MEQ tablet Take 10 mEq by mouth daily. 12/13/18  Yes [provider]  latanoprost (XALATAN) 0.005 % ophthalmic solution Place 1 drop into both eyes at bedtime.    Yes [provider]  metFORMIN (GLUCOPHAGE) 500 MG tablet Take 1 tablet (500 mg total) by mouth 2 (two) times daily with a meal. 07/31/17  Yes Epifanio Lesches, MD  methocarbamol (ROBAXIN) 500 MG tablet Take 500 mg by mouth 3 (three) times daily.   Yes [provider]  olmesartan-hydrochlorothiazide (BENICAR HCT) 40-25 MG tablet Take 1 tablet by mouth daily. 03/17/18  Yes [provider]  pantoprazole (PROTONIX) 40 MG tablet Take 1 tablet by mouth 2 (two) times daily.    Yes [provider]  pregabalin (LYRICA) 150 MG capsule Take 150 mg by mouth 2 (two) times daily.    Yes [provider]  rOPINIRole (REQUIP) 1 MG tablet TAKE 1 TABLET BY MOUTH EVERYDAY AT BEDTIME 11/11/18  Yes [provider]  ACCU-CHEK AVIVA PLUS test strip  07/07/18   [provider]  ACCU-CHEK SOFTCLIX LANCETS lancets 2 (two) times daily. 07/16/18   [provider]  atorvastatin (LIPITOR) 40 MG tablet Take 1 tablet (40 mg total) by mouth daily. 05/25/18 03/17/19  Max Sane, MD  Blood Glucose Monitoring Suppl (ACCU-CHEK AVIVA PLUS) w/Device KIT EVERY DAY 07/16/18   [provider]  CHANTIX 0.5 MG tablet  01/18/19   [provider]  fluconazole (DIFLUCAN) 150 MG tablet Take 150 mg by mouth daily. 06/03/18   [provider]  hydrOXYzine (VISTARIL) 25 MG capsule Take 25 mg by mouth every 12 (twelve) hours.  03/22/18   [provider]  insulin aspart (NOVOLOG) 100 UNIT/ML injection Inject 0-9 Units into the skin 3 (three) times daily  with meals. Patient not taking: Reported on 07/21/2019 07/31/17   Epifanio Lesches, MD  LEVEMIR FLEXTOUCH 100 UNIT/ML Pen Inject 25 Units into the skin daily. Patient reports she only takes Levemir if her glucose is >150 mg/dl which is rare for her 03/18/18   [provider]    REVIEW OF SYSTEMS:  Review of Systems  Constitutional: Positive for malaise/fatigue. Negative for chills, fever and weight loss.  HENT: Negative for ear pain, hearing loss and tinnitus.   Eyes: Negative for blurred vision, double vision, pain and redness.  Respiratory: Negative for cough, hemoptysis and shortness of breath.   Cardiovascular: Negative for chest pain, palpitations, orthopnea and leg swelling.  Gastrointestinal: Positive for vomiting. Negative for  abdominal pain, constipation, diarrhea and nausea.  Genitourinary: Negative for dysuria, frequency and hematuria.  Musculoskeletal: Negative for back pain, joint pain and neck pain.  Skin:       No acne, rash, or lesions  Neurological: Positive for weakness. Negative for dizziness, tremors and focal weakness.  Endo/Heme/Allergies: Negative for polydipsia. Does not bruise/bleed easily.  Psychiatric/Behavioral: Negative for depression. The patient is not nervous/anxious and does not have insomnia.      VITAL SIGNS:   Vitals:   07/21/19 1722 07/21/19 1800 07/21/19 1900 07/21/19 2030  BP: (!) 150/108 (!) 93/56 (!) 95/50 106/61  Pulse: 77 83 64 (!) 56  Resp: 18  16 11   Temp: 98.2 F (36.8 C)     TempSrc: Oral     SpO2: 96% 100% 98% 97%  Weight:      Height:       Wt Readings from Last 3 Encounters:  07/21/19 93 kg  07/05/19 93.4 kg  03/29/19 94.3 kg    PHYSICAL EXAMINATION:  Physical Exam  Vitals reviewed. Constitutional: She is oriented to person, place, and time. She appears well-developed and well-nourished. No distress.  HENT:  Head: Normocephalic and atraumatic.  Dry mucous membranes  Eyes: Pupils are equal, round, and  reactive to light. Conjunctivae and EOM are normal. No scleral icterus.  Neck: Normal range of motion. Neck supple. No JVD present. No thyromegaly present.  Cardiovascular: Normal rate, regular rhythm and intact distal pulses. Exam reveals no gallop and no friction rub.  No murmur heard. Respiratory: Effort normal and breath sounds normal. No respiratory distress. She has no wheezes. She has no rales.  GI: Soft. Bowel sounds are normal. She exhibits no distension. There is no abdominal tenderness.  Musculoskeletal: Normal range of motion.        General: No edema.     Comments: No arthritis, no gout  Lymphadenopathy:    She has no cervical adenopathy.  Neurological: She is alert and oriented to person, place, and time. No cranial nerve deficit.  No dysarthria, no aphasia  Skin: Skin is warm and dry. No rash noted. No erythema.  Psychiatric: She has a normal mood and affect. Her behavior is normal. Judgment and thought content normal.    LABORATORY PANEL:   CBC Recent Labs  Lab 07/21/19 1800  WBC 17.0*  HGB 9.7*  HCT 32.6*  PLT 388   ------------------------------------------------------------------------------------------------------------------  Chemistries  Recent Labs  Lab 07/21/19 1800  NA 135  K 3.2*  CL 102  CO2 24  GLUCOSE 116*  BUN 22  CREATININE 1.21*  CALCIUM 9.8   ------------------------------------------------------------------------------------------------------------------  Cardiac Enzymes No results for input(s): TROPONINI in the last 168 hours. ------------------------------------------------------------------------------------------------------------------  RADIOLOGY:  Dg Chest Port 1 View  Result Date: 07/21/2019 CLINICAL DATA:  Dyspnea EXAM: PORTABLE CHEST 1 VIEW COMPARISON:  05/23/2018 FINDINGS: The heart size and mediastinal contours are stable. Mild pulmonary vascular congestion. No focal airspace consolidation. No pleural effusion or  pneumothorax. IMPRESSION: Cardiomegaly with mild pulmonary vascular congestion. Electronically Signed   By: Davina Poke M.D.   On: 07/21/2019 19:17    EKG:   Orders placed or performed during the hospital encounter of 07/21/19  . ED EKG  . ED EKG    IMPRESSION AND PLAN:  Principal Problem:   SIRS (systemic inflammatory response syndrome) (HCC) -unclear etiology upfront.  Patient's infectious work-up so far has not indicated a clear infection.  Her procalcitonin was also negative.  Her blood pressure has corrected with fluids, though  her lactic acid has remained somewhat elevated.  We will continue to check her lactic acid until within normal limits.  Cultures were sent.  IV antibiotics were given.  We will recheck her labs again after fluid resuscitation to see if her leukocytosis had anything to do with possible hemoconcentration. Active Problems:   AKI (acute kidney injury) ([place]) -IV fluids as above, avoid nephrotoxins and monitor   HTN (hypertension) -hold antihypertensives for now as the patient's blood pressure is still mildly hypotensive to borderline normotensive   Diabetes (HCC) -sliding scale insulin coverage   OSA (obstructive sleep apnea) -CPAP nightly  Chart review performed and case discussed with ED provider. Labs, imaging and/or ECG reviewed by provider and discussed with patient/family. Management plans discussed with the patient and/or family.  COVID-19 status: Pending  DVT PROPHYLAXIS: SubQ lovenox   GI PROPHYLAXIS:  None  ADMISSION STATUS: Observation    CODE STATUS: Full Code Status History    Date Active Date Inactive Code Status Order ID Comments User Context   05/23/2018 2309 05/25/2018 1554 Full Code [id number]  Nicholes Mango, MD Inpatient   05/23/2018 2306 05/23/2018 2309 Full Code [id number]  Nicholes Mango, MD Inpatient   07/28/2017 1855 07/31/2017 2033 Full Code [id number]  Vaughan Basta, MD Inpatient   09/30/2016 1713 10/02/2016 2013 Full Code  [id number]  Hower, Aaron Mose, MD ED   Advance Care Planning Activity      TOTAL TIME TAKING CARE OF THIS PATIENT: 40 minutes.   This patient was evaluated in the context of the global COVID-19 pandemic, which necessitated consideration that the patient might be at risk for infection with the SARS-CoV-2 virus that causes COVID-19. Institutional protocols and algorithms that pertain to the evaluation of patients at risk for COVID-19 are in a state of rapid change based on information released by regulatory bodies including the CDC and federal and state organizations. These policies and algorithms were followed to the best of this provider's knowledge to date during the patient's care at this facility.  Ethlyn Daniels 07/21/2019, 9:36 PM  [facility]  Office  [phone]  CC: Primary care physician; Remi Haggard, FNP  Note:  This document was prepared using Dragon voice recognition software and may include unintentional dictation errors.

## 2019-07-21 NOTE — Progress Notes (Signed)
CODE SEPSIS - PHARMACY COMMUNICATION  **Broad Spectrum Antibiotics should be administered within 1 hour of Sepsis diagnosis**  Time Code Sepsis Called/Page Received: 1847  Antibiotics Ordered: vanc/cefepime/Flagyl  Time of 1st antibiotic administration: 1923  Additional action taken by pharmacy: Bernice ,PharmD Clinical Pharmacist  07/21/2019  7:33 PM

## 2019-07-22 ENCOUNTER — Inpatient Hospital Stay: Payer: Medicare Other

## 2019-07-22 ENCOUNTER — Observation Stay: Payer: Medicare Other

## 2019-07-22 DIAGNOSIS — R6510 Systemic inflammatory response syndrome (SIRS) of non-infectious origin without acute organ dysfunction: Secondary | ICD-10-CM

## 2019-07-22 DIAGNOSIS — Z794 Long term (current) use of insulin: Secondary | ICD-10-CM | POA: Diagnosis not present

## 2019-07-22 DIAGNOSIS — J0100 Acute maxillary sinusitis, unspecified: Secondary | ICD-10-CM | POA: Diagnosis present

## 2019-07-22 DIAGNOSIS — I4891 Unspecified atrial fibrillation: Secondary | ICD-10-CM | POA: Diagnosis present

## 2019-07-22 DIAGNOSIS — R112 Nausea with vomiting, unspecified: Secondary | ICD-10-CM | POA: Diagnosis present

## 2019-07-22 DIAGNOSIS — H409 Unspecified glaucoma: Secondary | ICD-10-CM | POA: Diagnosis present

## 2019-07-22 DIAGNOSIS — Z981 Arthrodesis status: Secondary | ICD-10-CM | POA: Diagnosis not present

## 2019-07-22 DIAGNOSIS — F17210 Nicotine dependence, cigarettes, uncomplicated: Secondary | ICD-10-CM | POA: Diagnosis present

## 2019-07-22 DIAGNOSIS — J0110 Acute frontal sinusitis, unspecified: Secondary | ICD-10-CM | POA: Diagnosis present

## 2019-07-22 DIAGNOSIS — N179 Acute kidney failure, unspecified: Secondary | ICD-10-CM | POA: Diagnosis present

## 2019-07-22 DIAGNOSIS — E119 Type 2 diabetes mellitus without complications: Secondary | ICD-10-CM | POA: Diagnosis present

## 2019-07-22 DIAGNOSIS — Z833 Family history of diabetes mellitus: Secondary | ICD-10-CM | POA: Diagnosis not present

## 2019-07-22 DIAGNOSIS — M1990 Unspecified osteoarthritis, unspecified site: Secondary | ICD-10-CM | POA: Diagnosis present

## 2019-07-22 DIAGNOSIS — Z7982 Long term (current) use of aspirin: Secondary | ICD-10-CM | POA: Diagnosis not present

## 2019-07-22 DIAGNOSIS — E860 Dehydration: Secondary | ICD-10-CM | POA: Diagnosis present

## 2019-07-22 DIAGNOSIS — G4730 Sleep apnea, unspecified: Secondary | ICD-10-CM | POA: Diagnosis present

## 2019-07-22 DIAGNOSIS — G4733 Obstructive sleep apnea (adult) (pediatric): Secondary | ICD-10-CM | POA: Diagnosis present

## 2019-07-22 DIAGNOSIS — I959 Hypotension, unspecified: Secondary | ICD-10-CM | POA: Diagnosis present

## 2019-07-22 DIAGNOSIS — I10 Essential (primary) hypertension: Secondary | ICD-10-CM | POA: Diagnosis present

## 2019-07-22 DIAGNOSIS — Z20828 Contact with and (suspected) exposure to other viral communicable diseases: Secondary | ICD-10-CM | POA: Diagnosis present

## 2019-07-22 DIAGNOSIS — G43909 Migraine, unspecified, not intractable, without status migrainosus: Secondary | ICD-10-CM | POA: Diagnosis present

## 2019-07-22 DIAGNOSIS — K449 Diaphragmatic hernia without obstruction or gangrene: Secondary | ICD-10-CM | POA: Diagnosis present

## 2019-07-22 DIAGNOSIS — Z96659 Presence of unspecified artificial knee joint: Secondary | ICD-10-CM | POA: Diagnosis present

## 2019-07-22 DIAGNOSIS — Z7951 Long term (current) use of inhaled steroids: Secondary | ICD-10-CM | POA: Diagnosis not present

## 2019-07-22 LAB — CBC
HCT: 28.1 % — ABNORMAL LOW (ref 36.0–46.0)
Hemoglobin: 8.3 g/dL — ABNORMAL LOW (ref 12.0–15.0)
MCH: 21.5 pg — ABNORMAL LOW (ref 26.0–34.0)
MCHC: 29.5 g/dL — ABNORMAL LOW (ref 30.0–36.0)
MCV: 72.8 fL — ABNORMAL LOW (ref 80.0–100.0)
Platelets: 327 10*3/uL (ref 150–400)
RBC: 3.86 MIL/uL — ABNORMAL LOW (ref 3.87–5.11)
RDW: 16.9 % — ABNORMAL HIGH (ref 11.5–15.5)
WBC: 12.5 10*3/uL — ABNORMAL HIGH (ref 4.0–10.5)
nRBC: 0 % (ref 0.0–0.2)

## 2019-07-22 LAB — HEPATIC FUNCTION PANEL
ALT: 10 U/L (ref 0–44)
AST: 11 U/L — ABNORMAL LOW (ref 15–41)
Albumin: 2.8 g/dL — ABNORMAL LOW (ref 3.5–5.0)
Alkaline Phosphatase: 57 U/L (ref 38–126)
Bilirubin, Direct: <0.1 mg/dL (ref 0.0–0.2)
Total Bilirubin: 0.6 mg/dL (ref 0.3–1.2)
Total Protein: 6.1 g/dL — ABNORMAL LOW (ref 6.5–8.1)

## 2019-07-22 LAB — BASIC METABOLIC PANEL
Anion gap: 8 (ref 5–15)
BUN: 24 mg/dL — ABNORMAL HIGH (ref 8–23)
CO2: 24 mmol/L (ref 22–32)
Calcium: 8.3 mg/dL — ABNORMAL LOW (ref 8.9–10.3)
Chloride: 106 mmol/L (ref 98–111)
Creatinine, Ser: 1.04 mg/dL — ABNORMAL HIGH (ref 0.44–1.00)
GFR calc Af Amer: >60 mL/min (ref >60)
GFR calc non Af Amer: 53 mL/min — ABNORMAL LOW (ref >60)
Glucose, Bld: 130 mg/dL — ABNORMAL HIGH (ref 70–99)
Potassium: 3.6 mmol/L (ref 3.5–5.1)
Sodium: 138 mmol/L (ref 135–145)

## 2019-07-22 LAB — GLUCOSE, CAPILLARY
Glucose-Capillary: 103 mg/dL — ABNORMAL HIGH (ref 70–99)
Glucose-Capillary: 139 mg/dL — ABNORMAL HIGH (ref 70–99)
Glucose-Capillary: 124 mg/dL — ABNORMAL HIGH (ref 70–99)
Glucose-Capillary: 130 mg/dL — ABNORMAL HIGH (ref 70–99)

## 2019-07-22 LAB — SARS CORONAVIRUS 2 (TAT 6-24 HRS): SARS Coronavirus 2: NEGATIVE

## 2019-07-22 LAB — HEMOGLOBIN A1C
Hgb A1c MFr Bld: 6.4 % — ABNORMAL HIGH (ref 4.8–5.6)
Mean Plasma Glucose: 136.98 mg/dL

## 2019-07-22 LAB — MAGNESIUM: Magnesium: 2.1 mg/dL (ref 1.7–2.4)

## 2019-07-22 MED ORDER — FERROUS SULFATE 325 (65 FE) MG PO TABS
325.0000 mg | ORAL_TABLET | Freq: Two times a day (BID) | ORAL | Status: DC
  Administered 2019-07-22 – 2019-07-24 (×4): 325 mg via ORAL
  Filled 2019-07-22 (×4): qty 1

## 2019-07-22 MED ORDER — VANCOMYCIN HCL 10 G IV SOLR: 1750.0000 mg | INTRAVENOUS | Status: DC

## 2019-07-22 MED ORDER — VITAMIN B-12 1000 MCG PO TABS
1000.0000 ug | ORAL_TABLET | Freq: Every day | ORAL | Status: DC
  Administered 2019-07-22 – 2019-07-24 (×3): 1000 ug via ORAL
  Filled 2019-07-22 (×3): qty 1

## 2019-07-22 MED ORDER — VITAMIN C 500 MG PO TABS
500.0000 mg | ORAL_TABLET | Freq: Every day | ORAL | Status: DC
  Administered 2019-07-22 – 2019-07-24 (×3): 500 mg via ORAL
  Filled 2019-07-22 (×3): qty 1

## 2019-07-22 MED ORDER — SODIUM CHLORIDE 0.9 % IV SOLN
INTRAVENOUS | Status: DC
  Administered 2019-07-22 – 2019-07-24 (×5): via INTRAVENOUS

## 2019-07-22 MED ORDER — VITAMIN D 25 MCG (1000 UNIT) PO TABS
5000.0000 [IU] | ORAL_TABLET | Freq: Every day | ORAL | Status: DC
  Administered 2019-07-23 – 2019-07-24 (×2): 5000 [IU] via ORAL
  Filled 2019-07-22 (×2): qty 5

## 2019-07-22 MED ORDER — ATORVASTATIN CALCIUM 20 MG PO TABS
40.0000 mg | ORAL_TABLET | Freq: Every day | ORAL | Status: DC
  Administered 2019-07-22 – 2019-07-23 (×2): 40 mg via ORAL
  Filled 2019-07-22 (×2): qty 2

## 2019-07-22 NOTE — Consult Note (Signed)
PHARMACY - PHYSICIAN COMMUNICATION CRITICAL VALUE ALERT - BLOOD CULTURE IDENTIFICATION (BCID)  Lynn Potter is an 73 y.o. female who presented to [facility] on 07/21/2019 with a chief complaint of Emesis  Assessment:  1 out of 4 bottles positive for gram positive cocci. 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 Pt is afeb, soft BP, lactic acid 3.3 > 1.9, procal < 0.10, Wbc 17 > 12. Was started on cefepime and vancomycin.   Name of physician (or Provider) Contacted: Dr. Anselm Jungling.   Current antibiotics: None  Changes to prescribed antibiotics recommended: None.

## 2019-07-22 NOTE — Progress Notes (Signed)
Patient has refused cpap. She states she uses one at night occasionally however has chosen not to use one while here

## 2019-07-22 NOTE — Consult Note (Addendum)
Reason for Consult: RUE and LLE tremors  Referring Physician: Dr. Anselm Jungling  CC: LLE weakness   HPI: Lynn Potter is an 73 y.o. female comes in with complaint of an episode of emesis, with some weakness.  She states that this occurred yesterday while she was her car.  She started to feel tired and weak, and then vomited.  She says this is happened to her before when she had a very elevated glucose.  On evaluation in the ED tonight her glucose was only mildly elevated.  She did meet sirs criteria initially, and was profoundly hypotensive.  Her blood pressure corrected quickly with IV fluids.  Neurology consulted for weakness which appears mostly LLE and has some tremors in the RUE.   Past Medical History:  Diagnosis Date  . AF (atrial fibrillation) ([place])   . AKI (acute kidney injury) ([place]) 07/21/2019  . Arthritis   . Asthma   . Chicken pox   . Diabetes mellitus without complication ([place])   . Glaucoma   . Hemorrhoids   . History of hiatal hernia   . Hypertension   . Migraines   . Obesity   . Opiate use 03/16/2017  . Personal history of tobacco use, presenting hazards to health 06/18/2015  . Sleep apnea     Past Surgical History:  Procedure Laterality Date  . ABDOMINAL HYSTERECTOMY    . BACK SURGERY    . carpal tunnell    . CATARACT EXTRACTION    . CHOLECYSTECTOMY    . COLONOSCOPY WITH ESOPHAGOGASTRODUODENOSCOPY (EGD)    . ESOPHAGOGASTRODUODENOSCOPY (EGD) WITH PROPOFOL N/A 02/16/2017   Procedure: ESOPHAGOGASTRODUODENOSCOPY (EGD) WITH PROPOFOL;  Surgeon: Manya Silvas, MD;  Location: [facility] ENDOSCOPY;  Service: Endoscopy;  Laterality: N/A;  . EYE SURGERY    . JOINT REPLACEMENT    . LUMBAR FUSION    . TOTAL KNEE ARTHROPLASTY    . UPPER ESOPHAGEAL ENDOSCOPIC ULTRASOUND (EUS) N/A 01/03/2016   Procedure: UPPER ESOPHAGEAL ENDOSCOPIC ULTRASOUND (EUS);  Surgeon: Holly Bodily, MD;  Location: [facility] ENDOSCOPY;  Service: Gastroenterology;  Laterality: N/A;    Family History  Problem  Relation Age of Onset  . Diabetes Mother   . Diabetes Sister     Social History:  reports that she has been smoking cigarettes. She has been smoking about 1.00 pack per day. She has never used smokeless tobacco. She reports that she does not drink alcohol or use drugs.  No Known Allergies  Medications: I have reviewed the patient's current medications.  ROS: Unable to obtain due to periods of confusion   Physical Examination: Blood pressure 101/65, pulse 62, temperature 98.1 F (36.7 C), temperature source Oral, resp. rate 18, height 5\' 4"  (1.626 m), weight 93 kg, SpO2 100 %.   Neurological Examination   Mental Status: Alert to name  Cranial Nerves: II: Discs flat bilaterally; Visual fields grossly normal, pupils equal, round, reactive to light and accommodation III,IV, VI: ptosis not present, extra-ocular motions intact bilaterally V,VII: smile symmetric, facial light touch sensation normal bilaterally VIII: hearing normal bilaterally IX,X: gag reflex present XI: bilateral shoulder shrug XII: midline tongue extension Motor: Right : Upper extremity   4+/5    Left:     Upper extremity   5/5  Lower extremity   5/5     Lower extremity   2/5 Tone and bulk:normal tone throughout; no atrophy noted Sensory: Pinprick and light touch intact throughout, bilaterally Deep Tendon Reflexes: 1+ and symmetric throughout Plantars: Right: downgoing   Left:  downgoing Cerebellar: Not tested.     Laboratory Studies:   Basic Metabolic Panel: Recent Labs  Lab 07/21/19 1800 07/22/19 0618  NA 135 138  K 3.2* 3.6  CL 102 106  CO2 24 24  GLUCOSE 116* 130*  BUN 22 24*  CREATININE 1.21* 1.04*  CALCIUM 9.8 8.3*    Liver Function Tests: No results for input(s): AST, ALT, ALKPHOS, BILITOT, PROT, ALBUMIN in the last 168 hours. No results for input(s): LIPASE, AMYLASE in the last 168 hours. No results for input(s): AMMONIA in the last 168 hours.  CBC: Recent Labs  Lab 07/21/19 1800  07/22/19 0618  WBC 17.0* 12.5*  HGB 9.7* 8.3*  HCT 32.6* 28.1*  MCV 72.0* 72.8*  PLT 388 327    Cardiac Enzymes: No results for input(s): CKTOTAL, CKMB, CKMBINDEX, TROPONINI in the last 168 hours.  BNP: Invalid input(s): POCBNP  CBG: Recent Labs  Lab 07/21/19 1743 07/21/19 2308 07/22/19 0741 07/22/19 1157  GLUCAP 124* 121* 103* 139*    Microbiology: Results for orders placed or performed during the hospital encounter of 07/21/19  Blood Culture (routine x 2)     Status: None (Preliminary result)   Collection Time: 07/21/19  5:45 PM   Specimen: BLOOD  Result Value Ref Range Status   Specimen Description BLOOD BLOOD RIGHT HAND  Final   Special Requests   Final    BOTTLES DRAWN AEROBIC AND ANAEROBIC Blood Culture results may not be optimal due to an inadequate volume of blood received in culture bottles   Culture  Setup Time   Final    GRAM POSITIVE COCCI ANAEROBIC BOTTLE ONLY CRITICAL RESULT CALLED TO, READ BACK BY AND VERIFIED WITHDennison Mascot AT 1219 07/22/2019 [place] Performed at [facility], [address] [place], [place] [address]    Culture GRAM POSITIVE COCCI  Final   Report Status PENDING  Incomplete  Blood Culture (routine x 2)     Status: None (Preliminary result)   Collection Time: 07/21/19  6:25 PM   Specimen: BLOOD  Result Value Ref Range Status   Specimen Description BLOOD BLOOD RIGHT HAND  Final   Special Requests   Final    BOTTLES DRAWN AEROBIC AND ANAEROBIC Blood Culture results may not be optimal due to an inadequate volume of blood received in culture bottles   Culture   Final    NO GROWTH < 12 HOURS Performed at [facility], [address] [place], [place] [address]    Report Status PENDING  Incomplete  SARS CORONAVIRUS 2 (TAT 6-24 HRS) Nasopharyngeal Nasopharyngeal Swab     Status: None   Collection Time: 07/21/19  7:27 PM   Specimen: Nasopharyngeal Swab  Result Value Ref Range Status   SARS Coronavirus 2  NEGATIVE NEGATIVE Final    Comment: (NOTE) SARS-CoV-2 target nucleic acids are NOT DETECTED. The SARS-CoV-2 RNA is generally detectable in upper and lower respiratory specimens during the acute phase of infection. Negative results do not preclude SARS-CoV-2 infection, do not rule out co-infections with other pathogens, and should not be used as the sole basis for treatment or other patient management decisions. Negative results must be combined with clinical observations, patient history, and epidemiological information. The expected result is Negative. Fact Sheet for Patients: SugarRoll.be Fact Sheet for Healthcare Providers: https://www.woods-mathews.com/ This test is not yet approved or cleared by the [place] FDA and  has been authorized for detection and/or diagnosis of SARS-CoV-2 by FDA under an Emergency Use Authorization (EUA). This  EUA will remain  in effect (meaning this test can be used) for the duration of the COVID-19 declaration under Section 56 4(b)(1) of the Act, 21 U.S.C. section 360bbb-3(b)(1), unless the authorization is terminated or revoked sooner. Performed at [facility], [place] [address] [place], [place] [address]     Coagulation Studies: No results for input(s): LABPROT, INR in the last 72 hours.  Urinalysis:  Recent Labs  Lab 07/21/19 2043  COLORURINE YELLOW*  LABSPEC 1.009  PHURINE 6.0  GLUCOSEU >=500*  HGBUR NEGATIVE  BILIRUBINUR NEGATIVE  KETONESUR NEGATIVE  PROTEINUR NEGATIVE  NITRITE NEGATIVE  LEUKOCYTESUR TRACE*    Lipid Panel:     Component Value Date/Time   CHOL 149 05/24/2018 0506   TRIG 260 (H) 05/24/2018 0506   HDL 35 (L) 05/24/2018 0506   CHOLHDL 4.3 05/24/2018 0506   VLDL 52 (H) 05/24/2018 0506   LDLCALC 62 05/24/2018 0506    HgbA1C:  Lab Results  Component Value Date   HGBA1C 6.4 (H) 07/21/2019    Urine Drug Screen:  No results found for: LABOPIA, COCAINSCRNUR,  LABBENZ, AMPHETMU, THCU, LABBARB  Alcohol Level: No results for input(s): ETH in the last 168 hours.  Imaging: Dg Chest Port 1 View  Result Date: 07/21/2019 CLINICAL DATA:  Dyspnea EXAM: PORTABLE CHEST 1 VIEW COMPARISON:  05/23/2018 FINDINGS: The heart size and mediastinal contours are stable. Mild pulmonary vascular congestion. No focal airspace consolidation. No pleural effusion or pneumothorax. IMPRESSION: Cardiomegaly with mild pulmonary vascular congestion. Electronically Signed   By: Davina Poke M.D.   On: 07/21/2019 19:17     Assessment/Plan:  73 y.o. female comes in with complaint of an episode of emesis, with some weakness.  She states that this occurred yesterday while she was her car.  She started to feel tired and weak, and then vomited.  She says this is happened to her before when she had a very elevated glucose.  On evaluation in the ED tonight her glucose was only mildly elevated.  She did meet sirs criteria initially, and was profoundly hypotensive.  Her blood pressure corrected quickly with IV fluids.  Neurology consulted for weakness which appears mostly LLE and has some tremors in the RUE.   - She does have hx of A-fib without any anticoagulation so this could be embolic or small watershed stroke in the setting of hypotension.  - Agree with MRI of brain and if that's normal would obtain MRI T/L spine  - anti platelet therapy if possible - will follow imaging  - Unknown last known well so not TPA/intervention candidate 07/22/2019, 1:41 PM

## 2019-07-22 NOTE — Progress Notes (Signed)
[facility] NAME: Lynn Potter    MR#:  [id number]  DATE OF BIRTH:  May 12, 1946  SUBJECTIVE:  CHIEF COMPLAINT:   Chief Complaint  Patient presents with  . Emesis   Patient came with worsening nausea symptoms and was feeling dizzy she was noted to be hypotensive.  Lactic acid was elevated.  Responded to IV fluid.  Patient denies any fever or chills.  She has complains of chronic diarrhea for long time. She has complains of right upper extremity and left lower extremity weakness. REVIEW OF SYSTEMS:  CONSTITUTIONAL: No fever, have fatigue or weakness.  EYES: No blurred or double vision.  EARS, NOSE, AND THROAT: No tinnitus or ear pain.  RESPIRATORY: No cough, shortness of breath, wheezing or hemoptysis.  CARDIOVASCULAR: No chest pain, orthopnea, edema.  GASTROINTESTINAL: No nausea, vomiting, diarrhea or abdominal pain.  GENITOURINARY: No dysuria, hematuria.  ENDOCRINE: No polyuria, nocturia,  HEMATOLOGY: No anemia, easy bruising or bleeding SKIN: No rash or lesion. MUSCULOSKELETAL: No joint pain or arthritis.   NEUROLOGIC: No tingling, numbness, right upper extremity and left lower extremity weakness and some shaking.  PSYCHIATRY: No anxiety or depression.   ROS  DRUG ALLERGIES:  No Known Allergies  VITALS:  Blood pressure (!) 96/54, pulse (!) 55, temperature 98.4 F (36.9 C), temperature source Oral, resp. rate 18, height 5\' 4"  (1.626 m), weight 93 kg, SpO2 99 %.  PHYSICAL EXAMINATION:  GENERAL:  73 y.o.-year-old patient lying in the bed with no acute distress.  EYES: Pupils equal, round, reactive to light and accommodation. No scleral icterus. Extraocular muscles intact.  HEENT: Head atraumatic, normocephalic. Oropharynx and nasopharynx clear.  NECK:  Supple, no jugular venous distention. No thyroid enlargement, no tenderness.  LUNGS: Normal breath sounds bilaterally, no wheezing, rales,rhonchi or crepitation. No use of  accessory muscles of respiration.  CARDIOVASCULAR: S1, S2 normal. No murmurs, rubs, or gallops.  ABDOMEN: Soft, nontender, nondistended. Bowel sounds present. No organomegaly or mass.  EXTREMITIES: No pedal edema, cyanosis, or clubbing.  NEUROLOGIC: Cranial nerves II through XII are intact.   Right:Upper extremity   4+/5                                       Left:     Upper extremity   5/5             Lower extremity   5/5                                                  Lower extremity   2/5 . Gait not checked.  PSYCHIATRIC: The patient is alert and oriented x 3.  SKIN: No obvious rash, lesion, or ulcer.   Physical Exam LABORATORY PANEL:   CBC Recent Labs  Lab 07/22/19 0618  WBC 12.5*  HGB 8.3*  HCT 28.1*  PLT 327   ------------------------------------------------------------------------------------------------------------------  Chemistries  Recent Labs  Lab 07/22/19 0618  NA 138  K 3.6  CL 106  CO2 24  GLUCOSE 130*  BUN 24*  CREATININE 1.04*  CALCIUM 8.3*   ------------------------------------------------------------------------------------------------------------------  Cardiac Enzymes No results for input(s): TROPONINI in the last 168 hours. ------------------------------------------------------------------------------------------------------------------  RADIOLOGY:  Mr Brain Wo Contrast  Result Date: 07/22/2019 CLINICAL  DATA:  Facial weakness. Additional history provided: Emesis, weakness, left lower extremity weakness, right upper extremity tremors. EXAM: MRI HEAD WITHOUT CONTRAST TECHNIQUE: Multiplanar, multiecho pulse sequences of the brain and surrounding structures were obtained without intravenous contrast. COMPARISON:  Noncontrast head CT 03/27/2019, brain MRI/MRA 05/24/2018 FINDINGS: Brain: Multiple sequences are significantly motion degraded. This includes mild to moderate motion degradation of the diffusion-weighted imaging. Also of note, there is  moderate motion degradation of the sagittal and axial T1 weighted imaging. No convincing evidence of acute infarct on motion degraded diffusion-weighted imaging. No evidence of intracranial mass. No midline shift or extra-axial fluid collection. No chronic intracranial blood products. Moderate scattered T2/FLAIR hyperintensity within the cerebral white matter, consistent with chronic small vessel ischemic disease, similar to MRI 05/24/2018. Stable mild generalized parenchymal atrophy. Vascular: Flow voids maintained within the proximal large arterial vessels. Skull and upper cervical spine: Significantly limited evaluation of the upper cervical spine due to motion degradation. No focal marrow lesion identified. Sinuses/Orbits: Visualized orbits demonstrate no acute abnormality. No significant paranasal sinus disease or mastoid effusion IMPRESSION: 1. Motion degraded examination. 2. No evidence of acute intracranial abnormality, including acute infarction. 3. Mild generalized parenchymal atrophy with moderate chronic small vessel ischemic disease, unchanged. Electronically Signed   By: Kellie Simmering   On: 07/22/2019 15:24   Dg Chest Port 1 View  Result Date: 07/21/2019 CLINICAL DATA:  Dyspnea EXAM: PORTABLE CHEST 1 VIEW COMPARISON:  05/23/2018 FINDINGS: The heart size and mediastinal contours are stable. Mild pulmonary vascular congestion. No focal airspace consolidation. No pleural effusion or pneumothorax. IMPRESSION: Cardiomegaly with mild pulmonary vascular congestion. Electronically Signed   By: Davina Poke M.D.   On: 07/21/2019 19:17    ASSESSMENT AND PLAN:   Principal Problem:   SIRS (systemic inflammatory response syndrome) (HCC) Active Problems:   HTN (hypertension)   Diabetes (H[facility])   OSA (obstructive sleep apnea)   AKI (acute kidney injury) ([place])  *SIRS-suspected on admission. Patient does not have fever. Her procalcitonin is not high. White blood cell count and lactic acid were  high on admission but patient was also dehydrated and having hypotension. Which recovered with IV fluids. I do not think she was having acute infection. Blood cultures are sent.  She was started on broad-spectrum antibiotics.  I will stop the antibiotic and watch her without antibiotics here. Her urinalysis and chest x-ray are clear.  COVID-19 test is negative. Her blood culture has 1 out of 4 bottles developing gram-positive cocci. This could be contamination, continue to watch.  *Right upper extremity and left lower extremity weakness Patient has history of A. Fib. Not on anticoagulation. This could be embolic phenomenon or watershed infarct due to hypotension. Called neurology consult. Ordered MRI of brain.  Negative for acute finding. Neurology suggested to get MRI of thoracic and lumbar spine. Started on aspirin.  *Acute kidney injury Due to dehydration, avoid nephrotoxic medication and give IV fluids.  Improving.  *Hypertension Hold antihypertensive medications currently because of borderline and normal blood pressure.  *Obstructive sleep apnea-continue CPAP with  *Diabetes Keep on sliding scale coverage.   All the records are reviewed and case discussed with Care Management/Social Workerr. Management plans discussed with the patient, family and they are in agreement.  CODE STATUS: Full  TOTAL TIME TAKING CARE OF THIS PATIENT: 35 minutes.     POSSIBLE D/C IN 2-3 DAYS, DEPENDING ON CLINICAL CONDITION.   Vaughan Basta M.D on 07/22/2019   Between 7am to 6pm - Pager - [phone]  After 6pm go to [email or web address] - password [email or web address] [facility]  Office  [phone]  CC: Primary care physician; Remi Haggard, FNP  Note: This dictation was prepared with Dragon dictation along with smaller phrase technology. Any transcriptional errors that result from this process are unintentional.

## 2019-07-22 NOTE — Progress Notes (Signed)
Family Meeting Note  Advance Directive:yes  Today a meeting took place with the Patient.   The following clinical team members were present during this meeting:MD  The following were discussed:Patient's diagnosis: Weakness, hypotension, hyperlipidemia, chronic diarrhea, Patient's progosis: Unable to determine and Goals for treatment: DNR  Additional follow-up to be provided: Neurologist  Time spent during discussion:20 minutes  Vaughan Basta, MD

## 2019-07-22 NOTE — Progress Notes (Signed)
Pharmacy Antibiotic Note  Lynn Potter is a 73 y.o. female admitted on 07/21/2019 with sepsis.  Pharmacy has been consulted for Vancomycin and Cefepime dosing.  Initial doses given in ED, Cefepime 2gm, Vanc 2gm  Plan: Cefepime 2gm IV q12hrs  Vancomycin 1750 mg IV Q 36 hrs. Goal AUC 400-550. Expected AUC: 518 SCr used: 1.21   Height: 5\' 4"  (162.6 cm) Weight: 205 lb 0.4 oz (93 kg) IBW/kg (Calculated) : 54.7  Temp (24hrs), Avg:98.1 F (36.7 C), Min:98 F (36.7 C), Max:98.2 F (36.8 C)  Recent Labs  Lab 07/21/19 1800 07/21/19 1824 07/21/19 2309  WBC 17.0*  --   --   CREATININE 1.21*  --   --   LATICACIDVEN  --  3.3* 1.9    Estimated Creatinine Clearance: 45.1 mL/min (A) (by C-G formula based on SCr of 1.21 mg/dL (H)).    No Known Allergies  Antimicrobials this admission: Cefepime 10/15 >>  Vancomycin 10/15 >>  Flagyl 10/15 x 1  Dose adjustments this admission:   Microbiology results:  BCx:   UCx:    Sputum:    MRSA PCR:   Thank you for allowing pharmacy to be a part of this patient's care.  Hart Robinsons A 07/22/2019 12:26 AM

## 2019-07-23 ENCOUNTER — Other Ambulatory Visit: Payer: Self-pay | Admitting: Internal Medicine

## 2019-07-23 ENCOUNTER — Telehealth: Payer: Self-pay | Admitting: Internal Medicine

## 2019-07-23 DIAGNOSIS — D509 Iron deficiency anemia, unspecified: Secondary | ICD-10-CM

## 2019-07-23 DIAGNOSIS — D500 Iron deficiency anemia secondary to blood loss (chronic): Secondary | ICD-10-CM | POA: Insufficient documentation

## 2019-07-23 LAB — BASIC METABOLIC PANEL
Anion gap: 7 (ref 5–15)
BUN: 18 mg/dL (ref 8–23)
CO2: 24 mmol/L (ref 22–32)
Calcium: 7.9 mg/dL — ABNORMAL LOW (ref 8.9–10.3)
Chloride: 107 mmol/L (ref 98–111)
Creatinine, Ser: 0.72 mg/dL (ref 0.44–1.00)
GFR calc Af Amer: >60 mL/min (ref >60)
GFR calc non Af Amer: >60 mL/min (ref >60)
Glucose, Bld: 141 mg/dL — ABNORMAL HIGH (ref 70–99)
Potassium: 3.7 mmol/L (ref 3.5–5.1)
Sodium: 138 mmol/L (ref 135–145)

## 2019-07-23 LAB — CBC
HCT: 27.6 % — ABNORMAL LOW (ref 36.0–46.0)
Hemoglobin: 8.3 g/dL — ABNORMAL LOW (ref 12.0–15.0)
MCH: 21.9 pg — ABNORMAL LOW (ref 26.0–34.0)
MCHC: 30.1 g/dL (ref 30.0–36.0)
MCV: 72.8 fL — ABNORMAL LOW (ref 80.0–100.0)
Platelets: 285 10*3/uL (ref 150–400)
RBC: 3.79 MIL/uL — ABNORMAL LOW (ref 3.87–5.11)
RDW: 17 % — ABNORMAL HIGH (ref 11.5–15.5)
WBC: 9.7 10*3/uL (ref 4.0–10.5)
nRBC: 0 % (ref 0.0–0.2)

## 2019-07-23 LAB — GLUCOSE, CAPILLARY
Glucose-Capillary: 134 mg/dL — ABNORMAL HIGH (ref 70–99)
Glucose-Capillary: 131 mg/dL — ABNORMAL HIGH (ref 70–99)
Glucose-Capillary: 128 mg/dL — ABNORMAL HIGH (ref 70–99)
Glucose-Capillary: 164 mg/dL — ABNORMAL HIGH (ref 70–99)

## 2019-07-23 MED ORDER — AMOXICILLIN-POT CLAVULANATE 875-125 MG PO TABS
1.0000 | ORAL_TABLET | Freq: Two times a day (BID) | ORAL | Status: DC
  Administered 2019-07-23 – 2019-07-24 (×3): 1 via ORAL
  Filled 2019-07-23 (×3): qty 1

## 2019-07-23 NOTE — Progress Notes (Signed)
[facility] [place] NAME: Lynn Potter    MR#:  [id number]  DATE OF BIRTH:  12-04-1945  SUBJECTIVE:  CHIEF COMPLAINT:   Chief Complaint  Patient presents with  . Emesis   Patient came with worsening nausea symptoms and was feeling dizzy she was noted to be hypotensive.  Lactic acid was elevated.  Responded to IV fluid.  Patient denies any fever or chills.  She has complains of chronic diarrhea for long time. She has complains of right upper extremity and left lower extremity weakness. Talk to patient's daughter.  As per her patient does have weakness and shaking in her left lower limb since long time and she walks with a walker. Patient also told her left side of face is hurting.  I found on local palpation she has significant tenderness on mastoid and frontal sinus area pain  REVIEW OF SYSTEMS:  CONSTITUTIONAL: No fever, have fatigue or weakness.  EYES: No blurred or double vision.  EARS, NOSE, AND THROAT: No tinnitus or ear pain.  RESPIRATORY: No cough, shortness of breath, wheezing or hemoptysis.  CARDIOVASCULAR: No chest pain, orthopnea, edema.  GASTROINTESTINAL: No nausea, vomiting, diarrhea or abdominal pain.  GENITOURINARY: No dysuria, hematuria.  ENDOCRINE: No polyuria, nocturia,  HEMATOLOGY: No anemia, easy bruising or bleeding SKIN: No rash or lesion. MUSCULOSKELETAL: No joint pain or arthritis.   NEUROLOGIC: No tingling, numbness, right upper extremity and left lower extremity weakness and some shaking.  PSYCHIATRY: No anxiety or depression.   ROS  DRUG ALLERGIES:  No Known Allergies  VITALS:  Blood pressure (!) 141/80, pulse 74, temperature 98.1 F (36.7 C), temperature source Oral, resp. rate 18, height 5\' 4"  (1.626 m), weight 93 kg, SpO2 100 %.  PHYSICAL EXAMINATION:  GENERAL:  73 y.o.-year-old patient lying in the bed with no acute distress.  EYES: Pupils equal, round, reactive to light and accommodation. No scleral  icterus. Extraocular muscles intact.  HEENT: Head atraumatic, normocephalic. Oropharynx and nasopharynx clear.  Tenderness on local palpation and pressure on left maxillary and frontal sinus. NECK:  Supple, no jugular venous distention. No thyroid enlargement, no tenderness.  LUNGS: Normal breath sounds bilaterally, no wheezing, rales,rhonchi or crepitation. No use of accessory muscles of respiration.  CARDIOVASCULAR: S1, S2 normal. No murmurs, rubs, or gallops.  ABDOMEN: Soft, nontender, nondistended. Bowel sounds present. No organomegaly or mass.  EXTREMITIES: No pedal edema, cyanosis, or clubbing.  NEUROLOGIC: Cranial nerves II through XII are intact.   Right:Upper extremity   4+/5                                       Left:     Upper extremity   5/5             Lower extremity   5/5                                                  Lower extremity   2/5 . Gait not checked.  She has shaking on left lower extremity when trying to move. PSYCHIATRIC: The patient is alert and oriented x 3.  SKIN: No obvious rash, lesion, or ulcer.   Physical Exam LABORATORY PANEL:   CBC Recent Labs  Lab 07/23/19  0857  WBC 9.7  HGB 8.3*  HCT 27.6*  PLT 285   ------------------------------------------------------------------------------------------------------------------  Chemistries  Recent Labs  Lab 07/22/19 0618 07/23/19 0857  NA 138 138  K 3.6 3.7  CL 106 107  CO2 24 24  GLUCOSE 130* 141*  BUN 24* 18  CREATININE 1.04* 0.72  CALCIUM 8.3* 7.9*  MG 2.1  --   AST 11*  --   ALT 10  --   ALKPHOS 57  --   BILITOT 0.6  --    ------------------------------------------------------------------------------------------------------------------  Cardiac Enzymes No results for input(s): TROPONINI in the last 168 hours. ------------------------------------------------------------------------------------------------------------------  RADIOLOGY:  Mr Brain Wo Contrast  Result Date:  07/22/2019 CLINICAL DATA:  Facial weakness. Additional history provided: Emesis, weakness, left lower extremity weakness, right upper extremity tremors. EXAM: MRI HEAD WITHOUT CONTRAST TECHNIQUE: Multiplanar, multiecho pulse sequences of the brain and surrounding structures were obtained without intravenous contrast. COMPARISON:  Noncontrast head CT 03/27/2019, brain MRI/MRA 05/24/2018 FINDINGS: Brain: Multiple sequences are significantly motion degraded. This includes mild to moderate motion degradation of the diffusion-weighted imaging. Also of note, there is moderate motion degradation of the sagittal and axial T1 weighted imaging. No convincing evidence of acute infarct on motion degraded diffusion-weighted imaging. No evidence of intracranial mass. No midline shift or extra-axial fluid collection. No chronic intracranial blood products. Moderate scattered T2/FLAIR hyperintensity within the cerebral white matter, consistent with chronic small vessel ischemic disease, similar to MRI 05/24/2018. Stable mild generalized parenchymal atrophy. Vascular: Flow voids maintained within the proximal large arterial vessels. Skull and upper cervical spine: Significantly limited evaluation of the upper cervical spine due to motion degradation. No focal marrow lesion identified. Sinuses/Orbits: Visualized orbits demonstrate no acute abnormality. No significant paranasal sinus disease or mastoid effusion IMPRESSION: 1. Motion degraded examination. 2. No evidence of acute intracranial abnormality, including acute infarction. 3. Mild generalized parenchymal atrophy with moderate chronic small vessel ischemic disease, unchanged. Electronically Signed   By: Kellie Simmering   On: 07/22/2019 15:24   Mr Thoracic Spine Wo Contrast  Result Date: 07/22/2019 CLINICAL DATA:  Generalized weakness with particular involvement of the right arm and left leg. EXAM: MRI THORACIC SPINE WITHOUT CONTRAST TECHNIQUE: Multiplanar, multisequence MR  imaging of the thoracic spine was performed. No intravenous contrast was administered. COMPARISON:  None. FINDINGS: Alignment:  Normal Vertebrae: No thoracic vertebral fracture or focal bone lesion. Cord:  No cord compression or primary cord lesion. Paraspinal and other soft tissues: Negative Disc levels: No abnormality at T1-2. From T2-3 through T12-L1, there is ordinary age related disc degeneration with small endplate osteophytes and disc bulges. These cause minor indentations upon the ventral subarachnoid space but do not cause central canal stenosis or cord compression. There is no evidence of advanced facet arthropathy. No compressive foraminal stenosis is identified. IMPRESSION: No likely significant finding in the thoracic region. Ordinary age related spondylosis but without apparent compressive narrowing of the canal or foramina. Electronically Signed   By: Nelson Chimes M.D.   On: 07/22/2019 22:31   Mr Lumbar Spine Wo Contrast  Result Date: 07/22/2019 CLINICAL DATA:  Muscle weakness. Particularly right arm and left leg. EXAM: MRI LUMBAR SPINE WITHOUT CONTRAST TECHNIQUE: Multiplanar, multisequence MR imaging of the lumbar spine was performed. No intravenous contrast was administered. COMPARISON:  CT 10/11/2018.  MRI 09/26/2018. FINDINGS: Segmentation:  5 lumbar type vertebral bodies. Alignment:  Anterolisthesis L3-4 of 2 mm. Vertebrae:  Previous fusion procedure L4 to sacrum. Conus medullaris and cauda equina: Conus extends to the L1  level. Conus and cauda equina appear normal. Paraspinal and other soft tissues: Negative Disc levels: Mild non-compressive disc bulges at T12-L1, L1-2 and L2-3. Mild facet hypertrophy at L2-3. No compressive stenosis in that region. L3-4: Advanced bilateral facet arthropathy with 2 mm of anterolisthesis. Endplate osteophytes and disc protrusion. Severe spinal stenosis at this level likely to cause neural compression. L4 to sacrum: Previous posterior decompression,  diskectomy and fusion has a good appearance with wide patency of canal and foramina. Compared to the study of last year, findings are similar. 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 Electronically Signed   By: Nelson Chimes M.D.   On: 07/22/2019 22:28   Dg Chest Port 1 View  Result Date: 07/21/2019 CLINICAL DATA:  Dyspnea EXAM: PORTABLE CHEST 1 VIEW COMPARISON:  05/23/2018 FINDINGS: The heart size and mediastinal contours are stable. Mild pulmonary vascular congestion. No focal airspace consolidation. No pleural effusion or pneumothorax. IMPRESSION: Cardiomegaly with mild pulmonary vascular congestion. Electronically Signed   By: Davina Poke M.D.   On: 07/21/2019 19:17    ASSESSMENT AND PLAN:   Principal Problem:   SIRS (systemic inflammatory response syndrome) (HCC) Active Problems:   HTN (hypertension)   Diabetes (HCC)   OSA (obstructive sleep apnea)   AKI (acute kidney injury) ([place])  *SIRS-suspected on admission. Patient does not have fever. Her procalcitonin is not high. White blood cell count and lactic acid were high on admission but patient was also dehydrated and having hypotension. Which recovered with IV fluids. Blood cultures are sent.  She was started on broad-spectrum antibiotics. Her urinalysis and chest x-ray are clear.  COVID-19 test is negative. Her blood culture has 1 out of 4 bottles developing gram-positive cocci.  Coagulase-negative staph could be contamination.  *Sinusitis She has tenderness on left frontal and maxillary sinus area. I will start on Augmentin.  *Right upper extremity and left lower extremity weakness Patient has history of A. Fib. Not on anticoagulation. This could be embolic phenomenon or watershed infarct due to hypotension. Called neurology consult. Ordered MRI of brain.  Negative for acute finding. Neurology suggested to get  MRI of thoracic and lumbar spine.-No acute findings.  Chronic age-related changes and stenosis. Started on aspirin.  *Acute kidney injury Due to dehydration, avoid nephrotoxic medication and give IV fluids.  Improving.  *Hypertension Hold antihypertensive medications currently because of borderline and normal blood pressure.  *Obstructive sleep apnea-continue CPAP with  *Diabetes Keep on sliding scale coverage.   All the records are reviewed and case discussed with Care Management/Social Workerr. Management plans discussed with the patient, family and they are in agreement.  CODE STATUS: Full  TOTAL TIME TAKING CARE OF THIS PATIENT: 35 minutes.    POSSIBLE D/C IN 2-3 DAYS, DEPENDING ON CLINICAL CONDITION.   Vaughan Basta M.D on 07/23/2019   Between 7am to 6pm - Pager - [phone]  After 6pm go to [email or web address] - password [email or web address] [facility]  Office  [phone]  CC: Primary care physician; Remi Haggard, FNP  Note: This dictation was prepared with Dragon dictation along with smaller phrase technology. Any transcriptional errors that result from this process are unintentional.

## 2019-07-23 NOTE — Progress Notes (Signed)
Physical Therapy Evaluation Patient Details Name: Lynn Potter MRN: [id number] DOB: [birth year] Today's Date: 07/23/2019   History of Present Illness  Patient presents to the ED with a complaint of an episode of emesis, with some weakness.   Clinical Impression  Patient performs bed mobility with extra time and min assist. She performs sit to stand transfer with extra time and RW. She takes 2 steps to commode and transfers to commode. She needs several attempts for each sit to stand transfer with RW. She has decreased BLE strength 3/5 and will benefit from skilled PT to improve mobility and strength.      Follow Up Recommendations Home health PT    Equipment Recommendations  Rolling walker with 5" wheels    Recommendations for Other Services       Precautions / Restrictions Restrictions Weight Bearing Restrictions: No      Mobility  Bed Mobility Overal bed mobility: Modified Independent             General bed mobility comments: slow and needs extra time  Transfers Overall transfer level: Needs assistance Equipment used: Rolling walker (2 wheeled)             General transfer comment: needs several attempts to stand up from bedside.  Ambulation/Gait Ambulation/Gait assistance: (not able due to fatigue)              Stairs            Wheelchair Mobility    Modified Rankin (Stroke Patients Only)       Balance Overall balance assessment: Modified Independent                                           Pertinent Vitals/Pain Pain Assessment: No/denies pain    Home Living Family/patient expects to be discharged to:: Private residence Living Arrangements: Alone Available Help at Discharge: Family;Friend(s) Type of Home: Apartment Home Access: Elevator              Prior Function Level of Independence: Independent with assistive device(s)               Hand Dominance        Extremity/Trunk Assessment   Upper Extremity Assessment Upper Extremity Assessment: Overall WFL for tasks assessed    Lower Extremity Assessment Lower Extremity Assessment: Overall WFL for tasks assessed       Communication   Communication: No difficulties  Cognition Arousal/Alertness: Awake/alert Behavior During Therapy: WFL for tasks assessed/performed Overall Cognitive Status: Within Functional Limits for tasks assessed                                        General Comments      Exercises     Assessment/Plan    PT Assessment Patient needs continued PT services  PT Problem List Decreased strength;Decreased balance;Decreased mobility;Obesity       PT Treatment Interventions Gait training;Therapeutic activities;Therapeutic exercise    PT Goals (Current goals can be found in the Care Plan section)  Acute Rehab PT Goals Patient Stated Goal: to walk better and get stronger PT Goal Formulation: With patient Time For Goal Achievement: 08/06/19 Potential to Achieve Goals: Good    Frequency Min 2X/week   Barriers to discharge Decreased caregiver support  Co-evaluation               AM-PAC PT "6 Clicks" Mobility  Outcome Measure Help needed turning from your back to your side while in a flat bed without using bedrails?: A Little Help needed moving from lying on your back to sitting on the side of a flat bed without using bedrails?: A Lot Help needed moving to and from a bed to a chair (including a wheelchair)?: A Lot Help needed standing up from a chair using your arms (e.g., wheelchair or bedside chair)?: A Little Help needed to walk in hospital room?: A Lot Help needed climbing 3-5 steps with a railing? : A Lot 6 Click Score: 14    End of Session Equipment Utilized During Treatment: Gait belt Activity Tolerance: Patient tolerated treatment well Patient left: with bed alarm set   PT Visit Diagnosis: Muscle weakness (generalized) (M62.81);Difficulty in walking, not  elsewhere classified (R26.2)    Time: [id number] PT Time Calculation (min) (ACUTE ONLY): 25 min   Charges:   PT Evaluation $PT Eval Low Complexity: 1 Low PT Treatments $Therapeutic Activity: 23-37 mins          Alanson Puls, PT DPT 07/23/2019, 3:27 PM

## 2019-07-23 NOTE — Telephone Encounter (Signed)
Patient was recently seen for anemia-question iron deficiency/B12 deficiency.  Patient currently admitted to hospital for unrelated reason.  Heather - please have the patient follow-up in the next 2 weeks or so post discharge from hospital  # X-MD; labs-CBC BMP/iron infusion-Venofer/B12 injections. Orders are in.

## 2019-07-24 LAB — CULTURE, BLOOD (ROUTINE X 2)

## 2019-07-24 LAB — GLUCOSE, CAPILLARY
Glucose-Capillary: 96 mg/dL (ref 70–99)
Glucose-Capillary: 127 mg/dL — ABNORMAL HIGH (ref 70–99)

## 2019-07-24 MED ORDER — CYANOCOBALAMIN 1000 MCG PO TABS: 1000.0000 ug | ORAL_TABLET | Freq: Every day | ORAL | 1 refills | Status: AC

## 2019-07-24 MED ORDER — AMOXICILLIN-POT CLAVULANATE 875-125 MG PO TABS: 1.0000 | ORAL_TABLET | Freq: Two times a day (BID) | ORAL | 0 refills | Status: AC

## 2019-07-24 NOTE — Consult Note (Signed)
Subjective: back to baseline. Minimal LLE weakness but that was baseline   Past Medical History:  Diagnosis Date  . AF (atrial fibrillation) ([place])   . AKI (acute kidney injury) ([place]) 07/21/2019  . Arthritis   . Asthma   . Chicken pox   . Diabetes mellitus without complication ([place])   . Glaucoma   . Hemorrhoids   . History of hiatal hernia   . Hypertension   . Migraines   . Obesity   . Opiate use 03/16/2017  . Personal history of tobacco use, presenting hazards to health 06/18/2015  . Sleep apnea     Past Surgical History:  Procedure Laterality Date  . ABDOMINAL HYSTERECTOMY    . BACK SURGERY    . carpal tunnell    . CATARACT EXTRACTION    . CHOLECYSTECTOMY    . COLONOSCOPY WITH ESOPHAGOGASTRODUODENOSCOPY (EGD)    . ESOPHAGOGASTRODUODENOSCOPY (EGD) WITH PROPOFOL N/A 02/16/2017   Procedure: ESOPHAGOGASTRODUODENOSCOPY (EGD) WITH PROPOFOL;  Surgeon: Manya Silvas, MD;  Location: [facility] ENDOSCOPY;  Service: Endoscopy;  Laterality: N/A;  . EYE SURGERY    . JOINT REPLACEMENT    . LUMBAR FUSION    . TOTAL KNEE ARTHROPLASTY    . UPPER ESOPHAGEAL ENDOSCOPIC ULTRASOUND (EUS) N/A 01/03/2016   Procedure: UPPER ESOPHAGEAL ENDOSCOPIC ULTRASOUND (EUS);  Surgeon: Holly Bodily, MD;  Location: [facility] ENDOSCOPY;  Service: Gastroenterology;  Laterality: N/A;    Family History  Problem Relation Age of Onset  . Diabetes Mother   . Diabetes Sister     Social History:  reports that she has been smoking cigarettes. She has been smoking about 1.00 pack per day. She has never used smokeless tobacco. She reports that she does not drink alcohol or use drugs.  No Known Allergies  Medications: I have reviewed the patient's current medications.   Physical Examination: Blood pressure (!) 158/89, pulse 73, temperature 98.9 F (37.2 C), temperature source Oral, resp. rate 18, height 5\' 4"  (1.626 m), weight 93 kg, SpO2 100 %.   Neurological Examination   Mental Status: Alert to name   Cranial Nerves: II: Discs flat bilaterally; Visual fields grossly normal, pupils equal, round, reactive to light and accommodation III,IV, VI: ptosis not present, extra-ocular motions intact bilaterally V,VII: smile symmetric, facial light touch sensation normal bilaterally VIII: hearing normal bilaterally IX,X: gag reflex present XI: bilateral shoulder shrug XII: midline tongue extension Motor: Right : Upper extremity   5/5    Left:     Upper extremity   5/5  Lower extremity   5/5     Lower extremity   4+/5 Tone and bulk:normal tone throughout; no atrophy noted Sensory: Pinprick and light touch intact throughout, bilaterally Deep Tendon Reflexes: 1+ and symmetric throughout Plantars: Right: downgoing   Left: downgoing Cerebellar: Not tested.     Laboratory Studies:   Basic Metabolic Panel: Recent Labs  Lab 07/21/19 1800 07/22/19 0618 07/23/19 0857  NA 135 138 138  K 3.2* 3.6 3.7  CL 102 106 107  CO2 24 24 24   GLUCOSE 116* 130* 141*  BUN 22 24* 18  CREATININE 1.21* 1.04* 0.72  CALCIUM 9.8 8.3* 7.9*  MG  --  2.1  --     Liver Function Tests: Recent Labs  Lab 07/22/19 0618  AST 11*  ALT 10  ALKPHOS 57  BILITOT 0.6  PROT 6.1*  ALBUMIN 2.8*   No results for input(s): LIPASE, AMYLASE in the last 168 hours. No results for input(s): AMMONIA in the last  168 hours.  CBC: Recent Labs  Lab 07/21/19 1800 07/22/19 0618 07/23/19 0857  WBC 17.0* 12.5* 9.7  HGB 9.7* 8.3* 8.3*  HCT 32.6* 28.1* 27.6*  MCV 72.0* 72.8* 72.8*  PLT 388 327 285    Cardiac Enzymes: No results for input(s): CKTOTAL, CKMB, CKMBINDEX, TROPONINI in the last 168 hours.  BNP: Invalid input(s): POCBNP  CBG: Recent Labs  Lab 07/23/19 0801 07/23/19 1205 07/23/19 1651 07/23/19 1953 07/24/19 0758  GLUCAP 134* 131* 128* 164* 96    Microbiology: Results for orders placed or performed during the hospital encounter of 07/21/19  Blood Culture (routine x 2)     Status: Abnormal    Collection Time: 07/21/19  5:45 PM   Specimen: BLOOD  Result Value Ref Range Status   Specimen Description   Final    BLOOD BLOOD RIGHT HAND Performed at [facility], [address] [place], [place] [address]    Special Requests   Final    BOTTLES DRAWN AEROBIC AND ANAEROBIC Blood Culture results may not be optimal due to an inadequate volume of blood received in culture bottles Performed at [facility], [address] [place], [place] [address]    Culture  Setup Time   Final    GRAM POSITIVE COCCI ANAEROBIC BOTTLE ONLY CRITICAL RESULT CALLED TO, READ BACK BY AND VERIFIED WITHJuanda Crumble [facility] AT [id number] 07/22/2019 [place] Performed at [facility], [address] [place], [place] [address]    Culture (A)  Final    STAPHYLOCOCCUS SPECIES (COAGULASE NEGATIVE) THE SIGNIFICANCE OF ISOLATING THIS ORGANISM FROM A SINGLE SET OF BLOOD CULTURES WHEN MULTIPLE SETS ARE DRAWN IS UNCERTAIN. PLEASE NOTIFY THE MICROBIOLOGY DEPARTMENT WITHIN ONE WEEK IF SPECIATION AND SENSITIVITIES ARE REQUIRED. Performed at [facility], [place] [address], [place], [place] [address]    Report Status 07/24/2019 FINAL  Final  Blood Culture (routine x 2)     Status: None (Preliminary result)   Collection Time: 07/21/19  6:25 PM   Specimen: BLOOD  Result Value Ref Range Status   Specimen Description BLOOD BLOOD RIGHT HAND  Final   Special Requests   Final    BOTTLES DRAWN AEROBIC AND ANAEROBIC Blood Culture results may not be optimal due to an inadequate volume of blood received in culture bottles   Culture   Final    NO GROWTH 3 DAYS Performed at [facility], [address] [place], [place] [address]    Report Status PENDING  Incomplete  SARS CORONAVIRUS 2 (TAT 6-24 HRS) Nasopharyngeal Nasopharyngeal Swab     Status: None   Collection Time: 07/21/19  7:27 PM   Specimen: Nasopharyngeal Swab  Result Value Ref Range Status   SARS Coronavirus 2 NEGATIVE NEGATIVE Final     Comment: (NOTE) SARS-CoV-2 target nucleic acids are NOT DETECTED. The SARS-CoV-2 RNA is generally detectable in upper and lower respiratory specimens during the acute phase of infection. Negative results do not preclude SARS-CoV-2 infection, do not rule out co-infections with other pathogens, and should not be used as the sole basis for treatment or other patient management decisions. Negative results must be combined with clinical observations, patient history, and epidemiological information. The expected result is Negative. Fact Sheet for Patients: SugarRoll.be Fact Sheet for Healthcare Providers: https://www.woods-mathews.com/ This test is not yet approved or cleared by the [place] FDA and  has been authorized for detection and/or diagnosis of SARS-CoV-2 by FDA under an Emergency Use Authorization (EUA). This EUA will remain  in effect (meaning this test can be used) for the duration of the COVID-19 declaration under Section 56 4(b)(1) of the Act, 21 U.S.C. section 360bbb-3(b)(1), unless the authorization is terminated or revoked sooner. Performed at [facility], [place] [address], [place], [place] [address]     Coagulation Studies: No results for input(s): LABPROT, INR in the last 72 hours.  Urinalysis:  Recent Labs  Lab 07/21/19 2043  COLORURINE YELLOW*  LABSPEC 1.009  PHURINE 6.0  GLUCOSEU >=500*  HGBUR NEGATIVE  BILIRUBINUR NEGATIVE  KETONESUR NEGATIVE  PROTEINUR NEGATIVE  NITRITE NEGATIVE  LEUKOCYTESUR TRACE*    Lipid Panel:     Component Value Date/Time   CHOL 149 05/24/2018 0506   TRIG 260 (H) 05/24/2018 0506   HDL 35 (L) 05/24/2018 0506   CHOLHDL 4.3 05/24/2018 0506   VLDL 52 (H) 05/24/2018 0506   LDLCALC 62 05/24/2018 0506    HgbA1C:  Lab Results  Component Value Date   HGBA1C 6.4 (H) 07/21/2019    Urine Drug Screen:  No results found for: LABOPIA, COCAINSCRNUR, LABBENZ, AMPHETMU, THCU,  LABBARB  Alcohol Level: No results for input(s): ETH in the last 168 hours.  Imaging: Mr Brain Wo Contrast  Result Date: 07/22/2019 CLINICAL DATA:  Facial weakness. Additional history provided: Emesis, weakness, left lower extremity weakness, right upper extremity tremors. EXAM: MRI HEAD WITHOUT CONTRAST TECHNIQUE: Multiplanar, multiecho pulse sequences of the brain and surrounding structures were obtained without intravenous contrast. COMPARISON:  Noncontrast head CT 03/27/2019, brain MRI/MRA 05/24/2018 FINDINGS: Brain: Multiple sequences are significantly motion degraded. This includes mild to moderate motion degradation of the diffusion-weighted imaging. Also of note, there is moderate motion degradation of the sagittal and axial T1 weighted imaging. No convincing evidence of acute infarct on motion degraded diffusion-weighted imaging. No evidence of intracranial mass. No midline shift or extra-axial fluid collection. No chronic intracranial blood products. Moderate scattered T2/FLAIR hyperintensity within the cerebral white matter, consistent with chronic small vessel ischemic disease, similar to MRI 05/24/2018. Stable mild generalized parenchymal atrophy. Vascular: Flow voids maintained within the proximal large arterial vessels. Skull and upper cervical spine: Significantly limited evaluation of the upper cervical spine due to motion degradation. No focal marrow lesion identified. Sinuses/Orbits: Visualized orbits demonstrate no acute abnormality. No significant paranasal sinus disease or mastoid effusion IMPRESSION: 1. Motion degraded examination. 2. No evidence of acute intracranial abnormality, including acute infarction. 3. Mild generalized parenchymal atrophy with moderate chronic small vessel ischemic disease, unchanged. Electronically Signed   By: Kellie Simmering   On: 07/22/2019 15:24   Mr Thoracic Spine Wo Contrast  Result Date: 07/22/2019 CLINICAL DATA:  Generalized weakness with particular  involvement of the right arm and left leg. EXAM: MRI THORACIC SPINE WITHOUT CONTRAST TECHNIQUE: Multiplanar, multisequence MR imaging of the thoracic spine was performed. No intravenous contrast was administered. COMPARISON:  None. FINDINGS: Alignment:  Normal Vertebrae: No thoracic vertebral fracture or focal bone lesion. Cord:  No cord compression or primary cord lesion. Paraspinal and other soft tissues: Negative Disc levels: No abnormality at T1-2. From T2-3 through T12-L1, there is ordinary age related disc degeneration with small endplate osteophytes and disc bulges. These cause minor indentations upon the ventral subarachnoid space but do not cause central canal stenosis or cord compression. There is no evidence of advanced facet arthropathy. No compressive foraminal stenosis is identified. IMPRESSION: No likely significant finding in the thoracic region. Ordinary age related spondylosis but without apparent compressive narrowing of the canal or foramina. Electronically Signed  By: Nelson Chimes M.D.   On: 07/22/2019 22:31   Mr Lumbar Spine Wo Contrast  Result Date: 07/22/2019 CLINICAL DATA:  Muscle weakness. Particularly right arm and left leg. EXAM: MRI LUMBAR SPINE WITHOUT CONTRAST TECHNIQUE: Multiplanar, multisequence MR imaging of the lumbar spine was performed. No intravenous contrast was administered. COMPARISON:  CT 10/11/2018.  MRI 09/26/2018. FINDINGS: Segmentation:  5 lumbar type vertebral bodies. Alignment:  Anterolisthesis L3-4 of 2 mm. Vertebrae:  Previous fusion procedure L4 to sacrum. Conus medullaris and cauda equina: Conus extends to the L1 level. Conus and cauda equina appear normal. Paraspinal and other soft tissues: Negative Disc levels: Mild non-compressive disc bulges at T12-L1, L1-2 and L2-3. Mild facet hypertrophy at L2-3. No compressive stenosis in that region. L3-4: Advanced bilateral facet arthropathy with 2 mm of anterolisthesis. Endplate osteophytes and disc protrusion.  Severe spinal stenosis at this level likely to cause neural compression. L4 to sacrum: Previous posterior decompression, diskectomy and fusion has a good appearance with wide patency of canal and foramina. Compared to the study of last year, findings are similar. 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 Electronically Signed   By: Nelson Chimes M.D.   On: 07/22/2019 22:28     Assessment/Plan:  73 y.o. female comes in with complaint of an episode of emesis, with some weakness.  She states that this occurred yesterday while she was her car.  She started to feel tired and weak, and then vomited.  She says this is happened to her before when she had a very elevated glucose.  On evaluation in the ED tonight her glucose was only mildly elevated.  She did meet sirs criteria initially, and was profoundly hypotensive.  Her blood pressure corrected quickly with IV fluids.  Neurology consulted for weakness which appears mostly LLE and has some tremors in the RUE.   - MRI brain, T and L spine no acute abnormalities  - No further imaging from neurological stand point as back to baseline - glycemic control

## 2019-07-24 NOTE — TOC Transition Note (Signed)
Transition of Care [facility]) - CM/SW Discharge Note   Patient Details  Name: BREANN ROGNE MRN: [id number] Date of Birth: December 28, 1945  Transition of Care [facility]) CM/SW Contact:  Latanya Maudlin, RN Phone Number: 07/24/2019, 1:03 PM   Clinical Narrative:  Patient to be discharged per MD order. Orders in place for home health services. CMS Medicare.gov Compare Post Acute Care list reviewed with patient and she has no preference of agency. Referral placed with Malachy Mood at [facility]. No DME needs, patient uses rollator at baseline. Family to transport .    Final next level of care: Home w Home Health Services Barriers to Discharge: No Barriers Identified   Patient Goals and CMS Choice        Discharge Placement                       Discharge Plan and Services                          HH Arranged: PT, Nurse's Aide HH Agency: [place] Date [place]: 07/24/19 Time [place]: [place] Representative spoke with at [place]: [place] ([place]) Interventions     Readmission Risk Interventions Readmission Risk Prevention Plan 07/24/2019  Transportation Screening Complete  PCP or Specialist Appt within 3-5 Days Not Complete  HRI or [place] Complete  Social Work Consult for [place] Planning/Counseling Complete  Palliative Care Screening Complete  Medication Review Press photographer) Complete  Some recent data might be hidden

## 2019-07-24 NOTE — Progress Notes (Signed)
Pt has been discharged home.  Discharge papers given and explained to pt. Pt verbalized understanding.  Meds and f/u appointments reviewed. Rx sent to pharmacy. Pt made aware.

## 2019-07-24 NOTE — Progress Notes (Signed)
   SUBJECTIVE Patient with a history of diabetes mellitus presents to office today complaining of elongated, thickened nails that cause pain while ambulating in shoes. She is unable to trim her own nails. Patient is here for further evaluation and treatment.   Past Medical History:  Diagnosis Date  . AF (atrial fibrillation) ([place])   . AKI (acute kidney injury) ([place]) 07/21/2019  . Arthritis   . Asthma   . Chicken pox   . Diabetes mellitus without complication ([place])   . Glaucoma   . Hemorrhoids   . History of hiatal hernia   . Hypertension   . Migraines   . Obesity   . Opiate use 03/16/2017  . Personal history of tobacco use, presenting hazards to health 06/18/2015  . Sleep apnea     OBJECTIVE General Patient is awake, alert, and oriented x 3 and in no acute distress. Derm Skin is dry and supple bilateral. Negative open lesions or macerations. Remaining integument unremarkable. Nails are tender, long, thickened and dystrophic with subungual debris, consistent with onychomycosis, 1-5 bilateral. No signs of infection noted. Vasc  DP and PT pedal pulses palpable bilaterally. Temperature gradient within normal limits.  Neuro Epicritic and protective threshold sensation diminished bilaterally.  Musculoskeletal Exam Pain with palpation noted to the anterior, lateral and medial aspects of the bilateral ankle joints. No symptomatic pedal deformities noted bilateral. Muscular strength within normal limits.  ASSESSMENT 1. Diabetes Mellitus w/ peripheral neuropathy 2. Onychomycosis of nail due to dermatophyte bilateral 3. Bilateral ankle synovitis   PLAN OF CARE 1. Patient evaluated today. 2. Instructed to maintain good pedal hygiene and foot care. Stressed importance of controlling blood sugar.  3. Mechanical debridement of nails 1-5 bilaterally performed using a nail nipper. Filed with dremel without incident.  4. Injection of 0.5 mLs Celestone Soluspan injected into the bilateral ankle  joints.  5. Return to clinic in 3 mos.     Edrick Kins, DPM [facility]  Dr. Edrick Kins, [place]                                        [place], [place] [address]                Office [phone]  Fax [phone]

## 2019-07-24 NOTE — Discharge Summary (Signed)
[facility] [place] NAME: Lynn Potter    MR#:  [id number]  DATE OF BIRTH:  December 13, 1945  DATE OF ADMISSION:  07/21/2019 ADMITTING PHYSICIAN: Lance Coon, MD  DATE OF DISCHARGE: 07/24/2019   PRIMARY CARE PHYSICIAN: Remi Haggard, FNP    ADMISSION DIAGNOSIS:  Severe hypotension [I95.9] General weakness [R53.1] SIRS (systemic inflammatory response syndrome) (HCC) [R65.10]  DISCHARGE DIAGNOSIS:  Principal Problem:   SIRS (systemic inflammatory response syndrome) (HCC) Active Problems:   HTN (hypertension)   Diabetes (HCC)   OSA (obstructive sleep apnea)   AKI (acute kidney injury) ([place])   SECONDARY DIAGNOSIS:   Past Medical History:  Diagnosis Date  . AF (atrial fibrillation) ([place])   . AKI (acute kidney injury) ([place]) 07/21/2019  . Arthritis   . Asthma   . Chicken pox   . Diabetes mellitus without complication ([place])   . Glaucoma   . Hemorrhoids   . History of hiatal hernia   . Hypertension   . Migraines   . Obesity   . Opiate use 03/16/2017  . Personal history of tobacco use, presenting hazards to health 06/18/2015  . Sleep apnea     HOSPITAL COURSE:   *SIRS-suspected on admission. Now treating for sinusitis. Patient does not have fever.  Her procalcitonin is not high. White blood cell count and lactic acid were high on admission but patient was also dehydrated and having hypotension. Which recovered with IV fluids. Blood cultures are sent.  She was started on broad-spectrum antibiotics. Her urinalysis and chest x-ray are clear.  COVID-19 test is negative. Her blood culture has 1 out of 4 bottles developing gram-positive cocci.  Coagulase-negative staph could be contamination.  *Sinusitis She has tenderness on left frontal and maxillary sinus area.  on Augmentin. Will give 4 days more of Abx.  *Right upper extremity and left lower extremity weakness Patient has history of A. Fib. Not on  anticoagulation. This could be embolic phenomenon or watershed infarct due to hypotension. Called neurology consult. Ordered MRI of brain.  Negative for acute finding. Neurology suggested to get MRI of thoracic and lumbar spine.-No acute findings.  Chronic age-related changes and stenosis. Started on aspirin. Her weakness has improved so she is able to walk with a walker and states she feels back to her baseline and ready to go home.  I will arrange for home health and PT.  follow as out pt.  *Acute kidney injury Due to dehydration, avoid nephrotoxic medication and give IV fluids.  Improving.  *Hypertension Hold antihypertensive medications currently because of borderline and normal blood pressure.  *Obstructive sleep apnea-continue CPAP with  *Diabetes Keep on sliding scale coverage.   DISCHARGE CONDITIONS:   Stable.  CONSULTS OBTAINED:  Treatment Team:  Vaughan Basta, MD Leotis Pain, MD  DRUG ALLERGIES:  No Known Allergies  DISCHARGE MEDICATIONS:   Allergies as of 07/24/2019   No Known Allergies     Medication List    STOP taking these medications   Accu-Chek Aviva Plus test strip Generic drug: glucose blood   Accu-Chek Aviva Plus w/Device Kit   Accu-Chek Softclix Lancets lancets   amLODipine 10 MG tablet Commonly known as: NORVASC   fluconazole 150 MG tablet Commonly known as: DIFLUCAN   hydrOXYzine 25 MG capsule Commonly known as: VISTARIL   insulin aspart 100 UNIT/ML injection Commonly known as: novoLOG   Klor-Con M10 10 MEQ tablet Generic drug: potassium chloride   metFORMIN 500 MG tablet Commonly known  as: GLUCOPHAGE   methocarbamol 500 MG tablet Commonly known as: ROBAXIN   olmesartan-hydrochlorothiazide 40-25 MG tablet Commonly known as: BENICAR HCT     TAKE these medications   amoxicillin-clavulanate 875-125 MG tablet Commonly known as: AUGMENTIN Take 1 tablet by mouth every 12 (twelve) hours for 4 days.    aspirin 81 MG tablet Take 81 mg by mouth daily.   atorvastatin 40 MG tablet Commonly known as: LIPITOR Take 1 tablet (40 mg total) by mouth daily. What changed: Another medication with the same name was removed. Continue taking this medication, and follow the directions you see here.   azelastine 0.1 % nasal spray Commonly known as: ASTELIN Place 1 spray into both nostrils 2 (two) times daily as needed for rhinitis or allergies.   baclofen 10 MG tablet Commonly known as: LIORESAL 10 mg 2 (two) times daily.   Chantix 0.5 MG tablet Generic drug: varenicline   Combivent Respimat 20-100 MCG/ACT Aers respimat Generic drug: Ipratropium-Albuterol USE 1 VIAL VIA NEBULIZER 4 TIMES A DAY AS NEEDED   cyanocobalamin 1000 MCG tablet Take 1 tablet (1,000 mcg total) by mouth daily. Start taking on: July 25, 2019   D-5000 125 MCG (5000 UT) Tabs Generic drug: Cholecalciferol Take 5,000 Units by mouth daily.   ferrous sulfate 325 (65 FE) MG EC tablet Take 325 mg by mouth 2 (two) times daily.   HumaLOG KwikPen 100 UNIT/ML KwikPen Generic drug: insulin lispro Inject 3 Units into the skin 3 (three) times daily.   Jardiance 25 MG Tabs tablet Generic drug: empagliflozin Take 25 mg by mouth daily.   latanoprost 0.005 % ophthalmic solution Commonly known as: XALATAN Place 1 drop into both eyes at bedtime.   Levemir FlexTouch 100 UNIT/ML Pen Generic drug: Insulin Detemir Inject 25 Units into the skin daily. Patient reports she only takes Levemir if her glucose is >150 mg/dl which is rare for her   pantoprazole 40 MG tablet Commonly known as: PROTONIX Take 1 tablet by mouth 2 (two) times daily.   pregabalin 150 MG capsule Commonly known as: LYRICA Take 150 mg by mouth 2 (two) times daily.   rOPINIRole 1 MG tablet Commonly known as: REQUIP TAKE 1 TABLET BY MOUTH EVERYDAY AT BEDTIME   vitamin C 1000 MG tablet Take 1,000 mg by mouth daily.        DISCHARGE INSTRUCTIONS:    Follow with PMD in 1-2 weeks, Need follow up with neurosurgery as out pt for spinal stenosis.  If you experience worsening of your admission symptoms, develop shortness of breath, life threatening emergency, suicidal or homicidal thoughts you must seek medical attention immediately by calling 911 or calling your MD immediately  if symptoms less severe.  You Must read complete instructions/literature along with all the possible adverse reactions/side effects for all the Medicines you take and that have been prescribed to you. Take any new Medicines after you have completely understood and accept all the possible adverse reactions/side effects.   Please note  You were cared for by a hospitalist during your hospital stay. If you have any questions about your discharge medications or the care you received while you were in the hospital after you are discharged, you can call the unit and asked to speak with the hospitalist on call if the hospitalist that took care of you is not available. Once you are discharged, your primary care physician will handle any further medical issues. Please note that NO REFILLS for any discharge medications will be authorized once  you are discharged, as it is imperative that you return to your primary care physician (or establish a relationship with a primary care physician if you do not have one) for your aftercare needs so that they can reassess your need for medications and monitor your lab values.    Today   CHIEF COMPLAINT:   Chief Complaint  Patient presents with  . Emesis    HISTORY OF PRESENT ILLNESS:  Lynn Potter  is a 73 y.o. female presents with chief complaint as above.  Patient presents to the ED with a complaint of an episode of emesis, with some weakness.  She states that this occurred today while she was her car.  She started to feel tired and weak, and then vomited.  She says this is happened to her before when she had a very elevated glucose.  On  evaluation in the ED tonight her glucose was only mildly elevated.  She did meet sirs criteria initially, and was profoundly hypotensive.  Her blood pressure corrected quickly with IV fluids.  Infectious work-up thus far is largely unrevealing.  Hospitalist were called for admission and further evaluation   VITAL SIGNS:  Blood pressure (!) 158/89, pulse 73, temperature 98.9 F (37.2 C), temperature source Oral, resp. rate 18, height _0  (1.626 m), weight 93 kg, SpO2 100 %.  I/O:  No intake or output data in the 24 hours ending 07/24/19 1143  PHYSICAL EXAMINATION:   GENERAL:  73 y.o.-year-old patient lying in the bed with no acute distress.  EYES: Pupils equal, round, reactive to light and accommodation. No scleral icterus. Extraocular muscles intact.  HEENT: Head atraumatic, normocephalic. Oropharynx and nasopharynx clear.  Tenderness on local palpation and pressure on left maxillary and frontal sinus. NECK:  Supple, no jugular venous distention. No thyroid enlargement, no tenderness.  LUNGS: Normal breath sounds bilaterally, no wheezing, rales,rhonchi or crepitation. No use of accessory muscles of respiration.  CARDIOVASCULAR: S1, S2 normal. No murmurs, rubs, or gallops.  ABDOMEN: Soft, nontender, nondistended. Bowel sounds present. No organomegaly or mass.  EXTREMITIES: No pedal edema, cyanosis, or clubbing.  NEUROLOGIC: Cranial nerves II through XII are intact.  Right:Upper extremity4+/5Left: Upper extremity 5/5 Lower extremity 5/5Lower extremity 3-4/5 . Gait not checked.  She has shaking on left lower extremity when trying to move. PSYCHIATRIC: The patient is alert and oriented x 3.  SKIN: No obvious rash, lesion, or ulcer.    DATA REVIEW:   CBC Recent Labs  Lab 07/23/19 0857  WBC 9.7  HGB 8.3*  HCT 27.6*  PLT 285    Chemistries  Recent Labs  Lab  07/22/19 0618 07/23/19 0857  NA 138 138  K 3.6 3.7  CL 106 107  CO2 24 24  GLUCOSE 130* 141*  BUN 24* 18  CREATININE 1.04* 0.72  CALCIUM 8.3* 7.9*  MG 2.1  --   AST 11*  --   ALT 10  --   ALKPHOS 57  --   BILITOT 0.6  --     Cardiac Enzymes No results for input(s): TROPONINI in the last 168 hours.  Microbiology Results  Results for orders placed or performed during the hospital encounter of 07/21/19  Blood Culture (routine x 2)     Status: Abnormal   Collection Time: 07/21/19  5:45 PM   Specimen: BLOOD  Result Value Ref Range Status   Specimen Description   Final    BLOOD BLOOD RIGHT HAND Performed at [facility], [address] [place],  [name] [address]    Special Requests   Final    BOTTLES DRAWN AEROBIC AND ANAEROBIC Blood Culture results may not be optimal due to an inadequate volume of blood received in culture bottles Performed at [facility], [address], [place], [place] [address]    Culture  Setup Time   Final    GRAM POSITIVE COCCI ANAEROBIC BOTTLE ONLY CRITICAL RESULT CALLED TO, READ BACK BY AND VERIFIED WITHDennison Mascot AT 9373 07/22/2019 [place] Performed at [facility], [address] [place], [place] [address]    Culture (A)  Final    STAPHYLOCOCCUS SPECIES (COAGULASE NEGATIVE) THE SIGNIFICANCE OF ISOLATING THIS ORGANISM FROM A SINGLE SET OF BLOOD CULTURES WHEN MULTIPLE SETS ARE DRAWN IS UNCERTAIN. PLEASE NOTIFY THE MICROBIOLOGY DEPARTMENT WITHIN ONE WEEK IF SPECIATION AND SENSITIVITIES ARE REQUIRED. Performed at [facility], [place] [address], [place], [place] [address]    Report Status 07/24/2019 FINAL  Final  Blood Culture (routine x 2)     Status: None (Preliminary result)   Collection Time: 07/21/19  6:25 PM   Specimen: BLOOD  Result Value Ref Range Status   Specimen Description BLOOD BLOOD RIGHT HAND  Final   Special Requests   Final    BOTTLES DRAWN AEROBIC AND ANAEROBIC Blood Culture results may  not be optimal due to an inadequate volume of blood received in culture bottles   Culture   Final    NO GROWTH 3 DAYS Performed at [facility], [address], [place], [place] [address]    Report Status PENDING  Incomplete  SARS CORONAVIRUS 2 (TAT 6-24 HRS) Nasopharyngeal Nasopharyngeal Swab     Status: None   Collection Time: 07/21/19  7:27 PM   Specimen: Nasopharyngeal Swab  Result Value Ref Range Status   SARS Coronavirus 2 NEGATIVE NEGATIVE Final    Comment: (NOTE) SARS-CoV-2 target nucleic acids are NOT DETECTED. The SARS-CoV-2 RNA is generally detectable in upper and lower respiratory specimens during the acute phase of infection. Negative results do not preclude SARS-CoV-2 infection, do not rule out co-infections with other pathogens, and should not be used as the sole basis for treatment or other patient management decisions. Negative results must be combined with clinical observations, patient history, and epidemiological information. The expected result is Negative. Fact Sheet for Patients: SugarRoll.be Fact Sheet for Healthcare Providers: https://www.woods-mathews.com/ This test is not yet approved or cleared by the [place] FDA and  has been authorized for detection and/or diagnosis of SARS-CoV-2 by FDA under an Emergency Use Authorization (EUA). This EUA will remain  in effect (meaning this test can be used) for the duration of the COVID-19 declaration under Section 56 4(b)(1) of the Act, 21 U.S.C. section 360bbb-3(b)(1), unless the authorization is terminated or revoked sooner. Performed at [facility], [place] [address], [place], [place] [address]     RADIOLOGY:  Mr Brain Wo Contrast  Result Date: 07/22/2019 CLINICAL DATA:  Facial weakness. Additional history provided: Emesis, weakness, left lower extremity weakness, right upper extremity tremors. EXAM: MRI HEAD WITHOUT CONTRAST TECHNIQUE:  Multiplanar, multiecho pulse sequences of the brain and surrounding structures were obtained without intravenous contrast. COMPARISON:  Noncontrast head CT 03/27/2019, brain MRI/MRA 05/24/2018 FINDINGS: Brain: Multiple sequences are significantly motion degraded. This includes mild to moderate motion degradation of the diffusion-weighted imaging. Also of note, there is moderate motion degradation of the sagittal and axial T1 weighted imaging. No convincing evidence of acute infarct on motion degraded diffusion-weighted imaging.  No evidence of intracranial mass. No midline shift or extra-axial fluid collection. No chronic intracranial blood products. Moderate scattered T2/FLAIR hyperintensity within the cerebral white matter, consistent with chronic small vessel ischemic disease, similar to MRI 05/24/2018. Stable mild generalized parenchymal atrophy. Vascular: Flow voids maintained within the proximal large arterial vessels. Skull and upper cervical spine: Significantly limited evaluation of the upper cervical spine due to motion degradation. No focal marrow lesion identified. Sinuses/Orbits: Visualized orbits demonstrate no acute abnormality. No significant paranasal sinus disease or mastoid effusion IMPRESSION: 1. Motion degraded examination. 2. No evidence of acute intracranial abnormality, including acute infarction. 3. Mild generalized parenchymal atrophy with moderate chronic small vessel ischemic disease, unchanged. Electronically Signed   By: Kellie Simmering   On: 07/22/2019 15:24   Mr Thoracic Spine Wo Contrast  Result Date: 07/22/2019 CLINICAL DATA:  Generalized weakness with particular involvement of the right arm and left leg. EXAM: MRI THORACIC SPINE WITHOUT CONTRAST TECHNIQUE: Multiplanar, multisequence MR imaging of the thoracic spine was performed. No intravenous contrast was administered. COMPARISON:  None. FINDINGS: Alignment:  Normal Vertebrae: No thoracic vertebral fracture or focal bone lesion.  Cord:  No cord compression or primary cord lesion. Paraspinal and other soft tissues: Negative Disc levels: No abnormality at T1-2. From T2-3 through T12-L1, there is ordinary age related disc degeneration with small endplate osteophytes and disc bulges. These cause minor indentations upon the ventral subarachnoid space but do not cause central canal stenosis or cord compression. There is no evidence of advanced facet arthropathy. No compressive foraminal stenosis is identified. IMPRESSION: No likely significant finding in the thoracic region. Ordinary age related spondylosis but without apparent compressive narrowing of the canal or foramina. Electronically Signed   By: Nelson Chimes M.D.   On: 07/22/2019 22:31   Mr Lumbar Spine Wo Contrast  Result Date: 07/22/2019 CLINICAL DATA:  Muscle weakness. Particularly right arm and left leg. EXAM: MRI LUMBAR SPINE WITHOUT CONTRAST TECHNIQUE: Multiplanar, multisequence MR imaging of the lumbar spine was performed. No intravenous contrast was administered. COMPARISON:  CT 10/11/2018.  MRI 09/26/2018. FINDINGS: Segmentation:  5 lumbar type vertebral bodies. Alignment:  Anterolisthesis L3-4 of 2 mm. Vertebrae:  Previous fusion procedure L4 to sacrum. Conus medullaris and cauda equina: Conus extends to the L1 level. Conus and cauda equina appear normal. Paraspinal and other soft tissues: Negative Disc levels: Mild non-compressive disc bulges at T12-L1, L1-2 and L2-3. Mild facet hypertrophy at L2-3. No compressive stenosis in that region. L3-4: Advanced bilateral facet arthropathy with 2 mm of anterolisthesis. Endplate osteophytes and disc protrusion. Severe spinal stenosis at this level likely to cause neural compression. L4 to sacrum: Previous posterior decompression, diskectomy and fusion has a good appearance with wide patency of canal and foramina. Compared to the study of last year, findings are similar. IMPRESSION: Redemonstration of severe multifactorial spinal  stenosis at the L3-4 level which could cause neural compression on either or both sides. Electronically Signed   By: Nelson Chimes M.D.   On: 07/22/2019 22:28    EKG:   Orders placed or performed during the hospital encounter of 07/21/19  . ED EKG  . ED EKG      Management plans discussed with the patient, family and they are in agreement.  CODE STATUS: Full.    Code Status Orders  (From admission, onward)         Start     Ordered   07/22/19 1643  Do not attempt resuscitation (DNR)  Continuous    Question  Answer Comment  In the event of cardiac or respiratory ARREST Do not call a "code blue"   In the event of cardiac or respiratory ARREST Do not perform Intubation, CPR, defibrillation or ACLS   In the event of cardiac or respiratory ARREST Use medication by any route, position, wound care, and other measures to relive pain and suffering. May use oxygen, suction and manual treatment of airway obstruction as needed for comfort.      07/22/19 1642        Code Status History    Date Active Date Inactive Code Status Order ID Comments User Context   07/21/2019 2243 07/22/2019 1642 Full Code [id number]  Lance Coon, MD Inpatient   05/23/2018 2309 05/25/2018 1554 Full Code [id number]  Nicholes Mango, MD Inpatient   05/23/2018 2306 05/23/2018 2309 Full Code [id number]  Nicholes Mango, MD Inpatient   07/28/2017 1855 07/31/2017 2033 Full Code [id number]  Vaughan Basta, MD Inpatient   09/30/2016 1713 10/02/2016 2013 Full Code [id number]  Hower, Aaron Mose, MD ED   Advance Care Planning Activity      TOTAL TIME TAKING CARE OF THIS PATIENT: 35 minutes.    Vaughan Basta M.D on 07/24/2019 at 11:43 AM  Between 7am to 6pm - Pager - [phone]  After 6pm go to [email or web address] - password [email or web address] [facility]  Office  [phone]  CC: Primary care physician; Remi Haggard, FNP   Note: This dictation was prepared with Dragon dictation along with smaller  phrase technology. Any transcriptional errors that result from this process are unintentional.

## 2019-07-25 ENCOUNTER — Ambulatory Visit: Payer: Medicare Other | Attending: Otolaryngology

## 2019-07-25 NOTE — Addendum Note (Signed)
Addended by: Sabino Gasser on: 07/25/2019 10:52 AM   Modules accepted: Orders

## 2019-07-26 LAB — CULTURE, BLOOD (ROUTINE X 2): Culture: NO GROWTH

## 2019-07-29 ENCOUNTER — Other Ambulatory Visit
Admission: RE | Admit: 2019-07-29 | Discharge: 2019-07-29 | Disposition: A | Discharge status: Home / Self Care | Payer: Medicare Other | Source: Ambulatory Visit | Attending: Internal Medicine | Admitting: Internal Medicine

## 2019-07-29 DIAGNOSIS — Z20828 Contact with and (suspected) exposure to other viral communicable diseases: Secondary | ICD-10-CM | POA: Insufficient documentation

## 2019-07-29 DIAGNOSIS — Z01812 Encounter for preprocedural laboratory examination: Secondary | ICD-10-CM | POA: Diagnosis present

## 2019-07-29 LAB — SARS CORONAVIRUS 2 (TAT 6-24 HRS): SARS Coronavirus 2: NEGATIVE

## 2019-08-02 ENCOUNTER — Encounter: Payer: Self-pay | Admitting: *Deleted

## 2019-08-03 ENCOUNTER — Ambulatory Visit
Admission: RE | Admit: 2019-08-03 | Discharge: 2019-08-03 | Disposition: A | Discharge status: Home / Self Care | Payer: Medicare Other | Attending: Internal Medicine | Admitting: Internal Medicine

## 2019-08-03 ENCOUNTER — Ambulatory Visit: Payer: Medicare Other | Admitting: Certified Registered Nurse Anesthetist

## 2019-08-03 ENCOUNTER — Encounter: Payer: Self-pay | Admitting: Certified Registered Nurse Anesthetist

## 2019-08-03 ENCOUNTER — Encounter
Admission: RE | Disposition: A | Discharge status: Home / Self Care | Payer: Self-pay | Source: Home / Self Care | Attending: Internal Medicine

## 2019-08-03 DIAGNOSIS — M1990 Unspecified osteoarthritis, unspecified site: Secondary | ICD-10-CM | POA: Insufficient documentation

## 2019-08-03 DIAGNOSIS — J45909 Unspecified asthma, uncomplicated: Secondary | ICD-10-CM | POA: Diagnosis not present

## 2019-08-03 DIAGNOSIS — Z1211 Encounter for screening for malignant neoplasm of colon: Secondary | ICD-10-CM | POA: Insufficient documentation

## 2019-08-03 DIAGNOSIS — Z6834 Body mass index (BMI) 34.0-34.9, adult: Secondary | ICD-10-CM | POA: Diagnosis not present

## 2019-08-03 DIAGNOSIS — F17200 Nicotine dependence, unspecified, uncomplicated: Secondary | ICD-10-CM | POA: Insufficient documentation

## 2019-08-03 DIAGNOSIS — K222 Esophageal obstruction: Secondary | ICD-10-CM | POA: Diagnosis not present

## 2019-08-03 DIAGNOSIS — H409 Unspecified glaucoma: Secondary | ICD-10-CM | POA: Insufficient documentation

## 2019-08-03 DIAGNOSIS — K449 Diaphragmatic hernia without obstruction or gangrene: Secondary | ICD-10-CM | POA: Diagnosis not present

## 2019-08-03 DIAGNOSIS — K219 Gastro-esophageal reflux disease without esophagitis: Secondary | ICD-10-CM | POA: Insufficient documentation

## 2019-08-03 DIAGNOSIS — K5730 Diverticulosis of large intestine without perforation or abscess without bleeding: Secondary | ICD-10-CM | POA: Diagnosis not present

## 2019-08-03 DIAGNOSIS — E669 Obesity, unspecified: Secondary | ICD-10-CM | POA: Diagnosis not present

## 2019-08-03 DIAGNOSIS — I10 Essential (primary) hypertension: Secondary | ICD-10-CM | POA: Insufficient documentation

## 2019-08-03 DIAGNOSIS — G4730 Sleep apnea, unspecified: Secondary | ICD-10-CM | POA: Diagnosis not present

## 2019-08-03 DIAGNOSIS — R1314 Dysphagia, pharyngoesophageal phase: Secondary | ICD-10-CM | POA: Diagnosis not present

## 2019-08-03 DIAGNOSIS — K640 First degree hemorrhoids: Secondary | ICD-10-CM | POA: Insufficient documentation

## 2019-08-03 DIAGNOSIS — Z86010 Personal history of colonic polyps: Secondary | ICD-10-CM | POA: Insufficient documentation

## 2019-08-03 DIAGNOSIS — Z79899 Other long term (current) drug therapy: Secondary | ICD-10-CM | POA: Diagnosis not present

## 2019-08-03 DIAGNOSIS — Z794 Long term (current) use of insulin: Secondary | ICD-10-CM | POA: Insufficient documentation

## 2019-08-03 DIAGNOSIS — Z980 Intestinal bypass and anastomosis status: Secondary | ICD-10-CM | POA: Insufficient documentation

## 2019-08-03 DIAGNOSIS — Z7982 Long term (current) use of aspirin: Secondary | ICD-10-CM | POA: Insufficient documentation

## 2019-08-03 DIAGNOSIS — E1140 Type 2 diabetes mellitus with diabetic neuropathy, unspecified: Secondary | ICD-10-CM | POA: Diagnosis not present

## 2019-08-03 HISTORY — PX: COLONOSCOPY WITH PROPOFOL: SHX5780

## 2019-08-03 HISTORY — DX: Other hemorrhoids: K64.8

## 2019-08-03 HISTORY — DX: Coagulation defect, unspecified: D68.9

## 2019-08-03 HISTORY — DX: Polyneuropathy, unspecified: G62.9

## 2019-08-03 HISTORY — DX: Gastro-esophageal reflux disease without esophagitis: K21.9

## 2019-08-03 HISTORY — DX: Diaphragmatic hernia without obstruction or gangrene: K44.9

## 2019-08-03 HISTORY — PX: ESOPHAGOGASTRODUODENOSCOPY (EGD) WITH PROPOFOL: SHX5813

## 2019-08-03 HISTORY — DX: Diverticulosis of intestine, part unspecified, without perforation or abscess without bleeding: K57.90

## 2019-08-03 HISTORY — DX: Personal history of urinary calculi: Z87.442

## 2019-08-03 HISTORY — DX: Cardiac arrhythmia, unspecified: I49.9

## 2019-08-03 LAB — GLUCOSE, CAPILLARY: Glucose-Capillary: 116 mg/dL — ABNORMAL HIGH (ref 70–99)

## 2019-08-03 SURGERY — ESOPHAGOGASTRODUODENOSCOPY (EGD) WITH PROPOFOL
Anesthesia: General

## 2019-08-03 MED ORDER — PROPOFOL 10 MG/ML IV BOLUS
INTRAVENOUS | Status: DC | PRN
  Administered 2019-08-03 (×2): 18 mg via INTRAVENOUS
  Administered 2019-08-03: 70 mg via INTRAVENOUS

## 2019-08-03 MED ORDER — SODIUM CHLORIDE 0.9 % IV SOLN
INTRAVENOUS | Status: DC
  Administered 2019-08-03: 09:00:00 via INTRAVENOUS

## 2019-08-03 MED ORDER — LIDOCAINE HCL (CARDIAC) PF 100 MG/5ML IV SOSY
PREFILLED_SYRINGE | INTRAVENOUS | Status: DC | PRN
  Administered 2019-08-03: 50 mg via INTRAVENOUS

## 2019-08-03 MED ORDER — PROPOFOL 500 MG/50ML IV EMUL
INTRAVENOUS | Status: DC | PRN
  Administered 2019-08-03: 130 ug/kg/min via INTRAVENOUS

## 2019-08-03 MED ORDER — PROPOFOL 500 MG/50ML IV EMUL
INTRAVENOUS | Status: AC
  Filled 2019-08-03: qty 50

## 2019-08-03 MED ORDER — LIDOCAINE HCL (PF) 2 % IJ SOLN
INTRAMUSCULAR | Status: AC
  Filled 2019-08-03: qty 10

## 2019-08-03 NOTE — Op Note (Signed)
[facility] Gastroenterology Patient Name: Lynn Potter Procedure Date: 08/03/2019 8:16 AM MRN: [id number] Account #: [id number] Date of Birth: 11/10/1945 Admit Type: Outpatient Age: 73 Room: [facility] ENDO ROOM 3 Gender: Female Note Status: Finalized Procedure:            Colonoscopy Indications:          Surveillance: Personal history of adenomatous polyps on                        last colonoscopy > 3 years ago Providers:            Lorie Apley K. Ahlam Piscitelli MD, MD Medicines:            Propofol per Anesthesia Complications:        No immediate complications. Procedure:            Pre-Anesthesia Assessment:                       - The risks and benefits of the procedure and the                        sedation options and risks were discussed with the                        patient. All questions were answered and informed                        consent was obtained.                       - Patient identification and proposed procedure were                        verified prior to the procedure by the nurse. The                        procedure was verified in the procedure room.                       - ASA Grade Assessment: III - A patient with severe                        systemic disease.                       - After reviewing the risks and benefits, the patient                        was deemed in satisfactory condition to undergo the                        procedure.                       After obtaining informed consent, the colonoscope was                        passed under direct vision. Throughout the procedure,                        the patient's blood pressure, pulse, and oxygen  saturations were monitored continuously. The                        Colonoscope was introduced through the anus and                        advanced to the the ileocolonic anastomosis. The                        colonoscopy was performed without difficulty. The                         patient tolerated the procedure well. The quality of                        the bowel preparation was good. Ileocolonic anastomosis                        were photographed. Findings:      The perianal and digital rectal examinations were normal. Pertinent       negatives include normal sphincter tone and no palpable rectal lesions.      Multiple small-mouthed diverticula were found in the left colon.      Non-bleeding internal hemorrhoids were found during retroflexion. The       hemorrhoids were Grade I (internal hemorrhoids that do not prolapse).      There was evidence of a prior end-to-side ileo-colonic anastomosis in       the ascending colon. This was patent and was characterized by healthy       appearing mucosa. The anastomosis was traversed. Estimated blood loss:       none.      Non-bleeding internal hemorrhoids were found during retroflexion. The       hemorrhoids were Grade I (internal hemorrhoids that do not prolapse).      The exam was otherwise without abnormality. Impression:           - Diverticulosis in the left colon.                       - Non-bleeding internal hemorrhoids.                       - Patent end-to-side ileo-colonic anastomosis,                        characterized by healthy appearing mucosa.                       - Non-bleeding internal hemorrhoids.                       - The examination was otherwise normal.                       - No specimens collected. Recommendation:       - Await pathology results from EGD, also performed                        today.                       - Monitor results to esophageal dilation                       -  Patient has a contact number available for                        emergencies. The signs and symptoms of potential                        delayed complications were discussed with the patient.                        Return to normal activities tomorrow. Written discharge                         instructions were provided to the patient.                       - Resume previous diet.                       - Continue present medications.                       - Repeat colonoscopy in 5 years for surveillance.                       - Return to physician assistant in 3 months.                       - Please follow up with Tammi Klippel, PA-C for your                        visit to [facility]enter[facility]. Of course, I                        remain available to you if you need any help. Procedure Code(s):    --- Professional ---                       [id number], Colorectal cancer screening; colonoscopy on                        individual at high risk Diagnosis Code(s):    --- Professional ---                       K57.30, Diverticulosis of large intestine without                        perforation or abscess without bleeding                       Z98.0, Intestinal bypass and anastomosis status                       K64.0, First degree hemorrhoids                       Z86.010, Personal history of colonic polyps CPT copyright 2019 American Medical Association. All rights reserved. The codes documented in this report are preliminary and upon coder review may  be revised to meet current compliance requirements. Efrain Sella MD, MD 08/03/2019 9:25:25 AM This report has been signed electronically. Number of Addenda: 0 Note Initiated On: 08/03/2019 8:16 AM Scope Withdrawal Time:  0 hours 4 minutes 53 seconds  Total Procedure Duration: 0 hours 9 minutes 39 seconds  Estimated Blood Loss: Estimated blood loss: none.      [facility]

## 2019-08-03 NOTE — Anesthesia Postprocedure Evaluation (Signed)
Anesthesia Post Note  Patient: Lynn Potter  Procedure(s) Performed: ESOPHAGOGASTRODUODENOSCOPY (EGD) WITH PROPOFOL (N/A ) COLONOSCOPY WITH PROPOFOL (N/A )  Patient location during evaluation: Endoscopy Anesthesia Type: General Level of consciousness: awake and alert Pain management: pain level controlled Vital Signs Assessment: post-procedure vital signs reviewed and stable Respiratory status: spontaneous breathing and respiratory function stable Cardiovascular status: stable Anesthetic complications: no     Last Vitals:  Vitals:   08/03/19 0923 08/03/19 0943  BP:  122/81  Pulse:  72  Resp:    Temp: (!) 36.1 C   SpO2:  98%    Last Pain:  Vitals:   08/03/19 0810  TempSrc: Tympanic  PainSc: 0-No pain                 Lennie Dunnigan K

## 2019-08-03 NOTE — Anesthesia Post-op Follow-up Note (Signed)
Anesthesia QCDR form completed.        

## 2019-08-03 NOTE — H&P (Signed)
Outpatient short stay form Pre-procedure 08/03/2019 8:24 AM Hillman Attig K. Alice Reichert, M.D.  Primary Physician:  Threasa Alpha, FNP  Reason for visit:  Dysphagia, personal hx of adenomatous colon polyps - March 2016 colonoscopy.  History of present illness:  73 y/o female presents with symptoms of both oropharyngeal and esophageal dysphagia. ENT physician recommended GI evaluation. Patient presents for colonoscopy for a personal hx of colon polyps. The patient denies abdominal pain, abnormal weight loss or rectal bleeding.     Current Facility-Administered Medications:  .  0.9 %  sodium chloride infusion, , Intravenous, Continuous, Tawnya Pujol, Benay Pike, MD  Medications Prior to Admission  Medication Sig Dispense Refill Last Dose  . amLODipine (NORVASC) 10 MG tablet Take 10 mg by mouth daily.   08/01/2019  . azelastine (ASTELIN) 0.1 % nasal spray Place 1 spray into both nostrils 2 (two) times daily as needed for rhinitis or allergies.   11 Past Week at Unknown time  . COMBIVENT RESPIMAT 20-100 MCG/ACT AERS respimat USE 1 VIAL VIA NEBULIZER 4 TIMES A DAY AS NEEDED   Past Month at Unknown time  . folic acid (FOLVITE) 1 MG tablet Take 1 mg by mouth daily.   08/01/2019  . hydrOXYzine (VISTARIL) 25 MG capsule Take 25 mg by mouth every 12 (twelve) hours as needed.   08/01/2019  . metFORMIN (GLUCOPHAGE) 500 MG tablet Take 500 mg by mouth 2 (two) times daily with a meal.   08/01/2019  . olmesartan-hydrochlorothiazide (BENICAR HCT) 40-25 MG tablet Take 1 tablet by mouth daily.   08/01/2019  . Omega-3 Fatty Acids (FISH OIL) 1000 MG CAPS Take by mouth daily.   08/01/2019  . Ascorbic Acid (VITAMIN C) 1000 MG tablet Take 1,000 mg by mouth daily.   08/01/2019  . aspirin 81 MG tablet Take 81 mg by mouth daily.   08/01/2019  . atorvastatin (LIPITOR) 40 MG tablet Take 1 tablet (40 mg total) by mouth daily. 30 tablet 0   . baclofen (LIORESAL) 10 MG tablet 10 mg 2 (two) times daily.    08/01/2019  . CHANTIX 0.5 MG  tablet      . Cholecalciferol (D-5000) 5000 units TABS Take 5,000 Units by mouth daily.    08/01/2019  . ferrous sulfate 325 (65 FE) MG EC tablet Take 325 mg by mouth 2 (two) times daily.    08/01/2019  . insulin lispro (HUMALOG KWIKPEN) 100 UNIT/ML KwikPen Inject 3 Units into the skin 3 (three) times daily.    08/01/2019  . JARDIANCE 25 MG TABS tablet Take 25 mg by mouth daily.    08/01/2019  . latanoprost (XALATAN) 0.005 % ophthalmic solution Place 1 drop into both eyes at bedtime.    08/01/2019  . LEVEMIR FLEXTOUCH 100 UNIT/ML Pen Inject 25 Units into the skin daily. Patient reports she only takes Levemir if her glucose is >150 mg/dl which is rare for her  5 08/01/2019  . pantoprazole (PROTONIX) 40 MG tablet Take 1 tablet by mouth 2 (two) times daily.    08/01/2019  . pregabalin (LYRICA) 150 MG capsule Take 150 mg by mouth 2 (two) times daily.    08/01/2019  . rOPINIRole (REQUIP) 1 MG tablet TAKE 1 TABLET BY MOUTH EVERYDAY AT BEDTIME   08/01/2019  . vitamin B-12 1000 MCG tablet Take 1 tablet (1,000 mcg total) by mouth daily. 30 tablet 1 08/01/2019     No Known Allergies   Past Medical History:  Diagnosis Date  . AF (atrial fibrillation) ([place])   .  AKI (acute kidney injury) ([place]) 07/21/2019  . Arthritis   . Asthma   . Chicken pox   . Clotting disorder ([place])   . Depression   . Diabetes mellitus without complication ([place])   . Diverticulosis   . Dysrhythmia   . GERD (gastroesophageal reflux disease)   . Glaucoma   . Hemorrhoids   . Hemorrhoids, internal   . HH (hiatus hernia)   . History of hiatal hernia   . History of kidney stones   . Hypertension   . Migraines   . Neuropathy   . Obesity   . Opiate use 03/16/2017  . Personal history of tobacco use, presenting hazards to health 06/18/2015  . Sleep apnea   . Tubular adenoma of colon     Review of systems:  Otherwise negative.    Physical Exam  Gen: Alert, oriented. Appears stated age.  HEENT: [place]/AT. PERRLA. Lungs:  CTA, no wheezes. CV: RR nl S1, S2. Abd: soft, benign, no masses. BS+ Ext: No edema. Pulses 2+    Planned procedures: Proceed with EGD and colonoscopy. The patient understands the nature of the planned procedure, indications, risks, alternatives and potential complications including but not limited to bleeding, infection, perforation, damage to internal organs and possible oversedation/side effects from anesthesia. The patient agrees and gives consent to proceed.  Please refer to procedure notes for findings, recommendations and patient disposition/instructions.     Corde Antonini K. Alice Reichert, M.D. Gastroenterology 08/03/2019  8:24 AM

## 2019-08-03 NOTE — Op Note (Signed)
[facility] Gastroenterology Patient Name: Lynn Potter Procedure Date: 08/03/2019 8:16 AM MRN: [id number] Account #: [id number] Date of Birth: Aug 07, 1946 Admit Type: Outpatient Age: 73 Room: [facility] ENDO ROOM 3 Gender: Female Note Status: Finalized Procedure:            Upper GI endoscopy Indications:          Pharyngeal phase dysphagia, Esophageal dysphagia Providers:            Benay Pike. Alice Reichert MD, MD Referring MD:         Jordan Likes. Lavena Bullion (Referring MD) Medicines:            Propofol per Anesthesia Complications:        No immediate complications. Estimated blood loss:                        Minimal. Procedure:            Pre-Anesthesia Assessment:                       - The risks and benefits of the procedure and the                        sedation options and risks were discussed with the                        patient. All questions were answered and informed                        consent was obtained.                       - Patient identification and proposed procedure were                        verified prior to the procedure by the nurse. The                        procedure was verified in the procedure room.                       - ASA Grade Assessment: III - A patient with severe                        systemic disease.                       - After reviewing the risks and benefits, the patient                        was deemed in satisfactory condition to undergo the                        procedure.                       After obtaining informed consent, the endoscope was                        passed under direct vision. Throughout the procedure,  the patient's blood pressure, pulse, and oxygen                        saturations were monitored continuously. The Endoscope                        was introduced through the mouth, and advanced to the                        third part of duodenum. The upper GI endoscopy was                       accomplished without difficulty. Findings:      One benign-appearing, intrinsic mild stenosis was found at the       gastroesophageal junction. This stenosis measured 1.5 cm (inner       diameter) x less than one cm (in length). The stenosis was traversed. A       TTS dilator was passed through the scope. Dilation with an 18-19-20 mm       balloon dilator was performed to 20 mm. The dilation site was examined       following endoscope reinsertion and showed moderate mucosal disruption.       Estimated blood loss was minimal.      Scattered mild mucosal changes characterized by feline appearance were       found in the entire esophagus. Biopsies were obtained from the proximal       and distal esophagus with cold forceps for histology of suspected       eosinophilic esophagitis. Estimated blood loss was minimal.      A 3 cm hiatal hernia was present.      The examined duodenum was normal.      The exam was otherwise without abnormality. Impression:           - Benign-appearing esophageal stenosis. Dilated.                       - Feline appearance mucosa in the esophagus. Biopsied.                       - 3 cm hiatal hernia.                       - Normal examined duodenum.                       - The examination was otherwise normal. Recommendation:       - Await pathology results.                       - Monitor results to esophageal dilation Procedure Code(s):    --- Professional ---                       [phone], Esophagogastroduodenoscopy, flexible, transoral;                        with transendoscopic balloon dilation of esophagus                        (less than 30 mm diameter)  [id number], 59, Esophagogastroduodenoscopy, flexible,                        transoral; with biopsy, single or multiple Diagnosis Code(s):    --- Professional ---                       R13.14, Dysphagia, pharyngoesophageal phase                       R13.13, Dysphagia,  pharyngeal phase                       K44.9, Diaphragmatic hernia without obstruction or                        gangrene                       K22.2, Esophageal obstruction CPT copyright 2019 American Medical Association. All rights reserved. The codes documented in this report are preliminary and upon coder review may  be revised to meet current compliance requirements. Efrain Sella MD, MD 08/03/2019 9:07:20 AM This report has been signed electronically. Number of Addenda: 0 Note Initiated On: 08/03/2019 8:16 AM Estimated Blood Loss: Estimated blood loss was minimal.      [facility]

## 2019-08-03 NOTE — Interval H&P Note (Signed)
History and Physical Interval Note:  08/03/2019 8:30 AM  Lynn Potter  has presented today for surgery, with the diagnosis of PERSONAL HX.OF [place].  The various methods of treatment have been discussed with the patient and family. After consideration of risks, benefits and other options for treatment, the patient has consented to  Procedure(s): ESOPHAGOGASTRODUODENOSCOPY (EGD) WITH PROPOFOL (N/A) COLONOSCOPY WITH PROPOFOL (N/A) as a surgical intervention.  The patient's history has been reviewed, patient examined, no change in status, stable for surgery.  I have reviewed the patient's chart and labs.  Questions were answered to the patient's satisfaction.     [place], [place]

## 2019-08-03 NOTE — Transfer of Care (Signed)
Immediate Anesthesia Transfer of Care Note  Patient: Lynn Potter  Procedure(s) Performed: ESOPHAGOGASTRODUODENOSCOPY (EGD) WITH PROPOFOL (N/A ) COLONOSCOPY WITH PROPOFOL (N/A )  Patient Location: PACU and Endoscopy Unit  Anesthesia Type:General  Level of Consciousness: drowsy  Airway & Oxygen Therapy: Patient Spontanous Breathing and Patient connected to nasal cannula oxygen  Post-op Assessment: Report given to RN and Post -op Vital signs reviewed and stable  Post vital signs: Reviewed and stable  Last Vitals:  Vitals Value Taken Time  BP 99/67 08/03/19 0924  Temp 36.1 C 08/03/19 0923  Pulse 88 08/03/19 0924  Resp 24 08/03/19 0924  SpO2 98 % 08/03/19 0924  Vitals shown include unvalidated device data.  Last Pain:  Vitals:   08/03/19 0810  TempSrc: Tympanic  PainSc: 0-No pain         Complications: No apparent anesthesia complications

## 2019-08-03 NOTE — Anesthesia Preprocedure Evaluation (Signed)
Anesthesia Evaluation    Airway Mallampati: III       Dental   Pulmonary sleep apnea (uses CPAP every other night) and Continuous Positive Airway Pressure Ventilation , COPD,  COPD inhaler, Current Smoker,           Cardiovascular hypertension, Pt. on medications (-) Past MI and (-) CHF + dysrhythmias (occassional palpitations) + Valvular Problems/Murmurs ("leaky vaklves")      Neuro/Psych neg Seizures Depression CVA (Possible visual siturbance, loss of bowel function)    GI/Hepatic Neg liver ROS, hiatal hernia, GERD  Medicated and Controlled,  Endo/Other  diabetes, Type 2, Oral Hypoglycemic AgentsHypothyroidism   Renal/GU negative Renal ROS     Musculoskeletal   Abdominal   Peds  Hematology   Anesthesia Other Findings   Reproductive/Obstetrics                             Anesthesia Physical Anesthesia Plan  ASA: III  Anesthesia Plan: General   Post-op Pain Management:    Induction: Intravenous  PONV Risk Score and Plan: 2 and Propofol infusion and TIVA  Airway Management Planned: Nasal Cannula  Additional Equipment:   Intra-op Plan:   Post-operative Plan:   Informed Consent: I have reviewed the patients History and Physical, chart, labs and discussed the procedure including the risks, benefits and alternatives for the proposed anesthesia with the patient or authorized representative who has indicated his/her understanding and acceptance.       Plan Discussed with:   Anesthesia Plan Comments:         Anesthesia Quick Evaluation

## 2019-08-04 ENCOUNTER — Encounter: Payer: Self-pay | Admitting: Internal Medicine

## 2019-08-04 LAB — SURGICAL PATHOLOGY

## 2019-08-08 ENCOUNTER — Inpatient Hospital Stay: Payer: Medicare Other | Attending: Nurse Practitioner

## 2019-08-08 ENCOUNTER — Inpatient Hospital Stay: Payer: Medicare Other | Admitting: Nurse Practitioner

## 2019-08-08 ENCOUNTER — Inpatient Hospital Stay: Payer: Medicare Other

## 2019-08-18 DIAGNOSIS — E041 Nontoxic single thyroid nodule: Secondary | ICD-10-CM | POA: Insufficient documentation

## 2019-10-18 ENCOUNTER — Other Ambulatory Visit: Payer: Self-pay

## 2019-10-18 ENCOUNTER — Ambulatory Visit (INDEPENDENT_AMBULATORY_CARE_PROVIDER_SITE_OTHER): Payer: Medicare Other | Admitting: Podiatry

## 2019-10-18 DIAGNOSIS — M79676 Pain in unspecified toe(s): Secondary | ICD-10-CM | POA: Diagnosis not present

## 2019-10-18 DIAGNOSIS — M722 Plantar fascial fibromatosis: Secondary | ICD-10-CM

## 2019-10-18 DIAGNOSIS — E0843 Diabetes mellitus due to underlying condition with diabetic autonomic (poly)neuropathy: Secondary | ICD-10-CM | POA: Diagnosis not present

## 2019-10-18 DIAGNOSIS — B351 Tinea unguium: Secondary | ICD-10-CM

## 2019-10-18 DIAGNOSIS — L989 Disorder of the skin and subcutaneous tissue, unspecified: Secondary | ICD-10-CM | POA: Diagnosis not present

## 2019-10-18 MED ORDER — MELOXICAM 15 MG PO TABS: 15.0000 mg | ORAL_TABLET | Freq: Every day | ORAL | 1 refills | Status: DC

## 2019-10-21 NOTE — Progress Notes (Signed)
Subjective: Patient is a 75 y.o. female presenting to the office today with a chief complaint of painful callus lesion(s) noted to the bilateral feet that have been present for the past several weeks. Walking and bearing weight increases the pain. She has not had any treatment for the symptoms.  Patient also complains of elongated, thickened nails that cause pain while ambulating in shoes. She is unable to trim her own nails.  She also complains of bilateral heel pain that began 2-3 months ago. She states the pain is worse in the morning when she first gets up. She has had injections in the past which help alleviate the pain. Being on the feet increases her symptoms. Patient presents today for further treatment and evaluation.   Past Medical History:  Diagnosis Date  . AF (atrial fibrillation) ([name])   . AKI (acute kidney injury) ([place]) 07/21/2019  . Arthritis   . Asthma   . Chicken pox   . Clotting disorder ([place])   . Depression   . Diabetes mellitus without complication ([place])   . Diverticulosis   . Dysrhythmia   . GERD (gastroesophageal reflux disease)   . Glaucoma   . Hemorrhoids   . Hemorrhoids, internal   . HH (hiatus hernia)   . History of hiatal hernia   . History of kidney stones   . Hypertension   . Migraines   . Neuropathy   . Obesity   . Opiate use 03/16/2017  . Personal history of tobacco use, presenting hazards to health 06/18/2015  . Sleep apnea   . Tubular adenoma of colon     Objective:  Physical Exam General: Alert and oriented x3 in no acute distress  Dermatology: Hyperkeratotic lesion(s) present on the bilateral feet. Pain on palpation with a central nucleated core noted. Skin is warm, dry and supple bilateral lower extremities. Negative for open lesions or macerations. Nails are tender, long, thickened and dystrophic with subungual debris, consistent with onychomycosis, 1-5 bilateral. No signs of infection noted.  Vascular: Palpable pedal pulses  bilaterally. No edema or erythema noted. Capillary refill within normal limits.  Neurological: Epicritic and protective threshold diminished bilaterally.   Musculoskeletal Exam: Pain on palpation at the keratotic lesion(s) noted. Tenderness to palpation to the plantar aspect of the bilateral heels along the plantar fascia. Range of motion within normal limits bilateral. Muscle strength 5/5 in all groups bilateral.  Assessment: 1. Onychodystrophic nails 1-5 bilateral with hyperkeratosis of nails.  2. Onychomycosis of nail due to dermatophyte bilateral 3. Pre-ulcerative callus lesions noted to the bilateral feet x 2 4. Plantar fasciitis bilateral    Plan of Care:  1. Patient evaluated. 2. Excisional debridement of keratoic lesion(s) using a chisel blade was performed without incident.  3. Dressed with light dressing. 4. Mechanical debridement of nails 1-5 bilaterally performed using a nail nipper. Filed with dremel without incident.  5. Injection of 0.5 mLs Celestone Soluspan injected into the bilateral heels.  6. Prescription for Meloxicam provided to patient. 7. Patient is to return to the clinic in 3 months.   Edrick Kins, DPM [facility]  Dr. Edrick Kins, [address]                                        [place], [place] [address]  Office [phone]  Fax [phone]

## 2019-11-22 ENCOUNTER — Telehealth: Payer: Self-pay | Admitting: *Deleted

## 2019-11-22 DIAGNOSIS — Z87891 Personal history of nicotine dependence: Secondary | ICD-10-CM

## 2019-11-22 NOTE — Telephone Encounter (Signed)
(  11/22/2019) Left message for patient to notify them that it is time to schedule annual low dose lung cancer screening CT scan. Instructed patient to call back to verify information prior to the scan being scheduled °SRW °  ° ° °

## 2019-11-25 NOTE — Addendum Note (Signed)
Addended by: Lieutenant Diego on: 11/25/2019 09:56 AM   Modules accepted: Orders

## 2019-11-25 NOTE — Telephone Encounter (Signed)
Patient has been notified that annual lung cancer screening low dose CT scan is due currently or will be in near future. Confirmed that patient is within the age range of 55-77, and asymptomatic, (no signs or symptoms of lung cancer). Patient denies illness that would prevent curative treatment for lung cancer if found. Verified smoking history, (current, 54.25 pack year). The shared decision making visit was done 03/27/14. Patient is agreeable for CT scan being scheduled.

## 2019-11-29 ENCOUNTER — Encounter: Payer: Self-pay | Admitting: *Deleted

## 2019-12-02 ENCOUNTER — Other Ambulatory Visit: Payer: Self-pay

## 2019-12-02 ENCOUNTER — Ambulatory Visit
Admission: RE | Admit: 2019-12-02 | Discharge: 2019-12-02 | Disposition: A | Discharge status: Home / Self Care | Payer: Medicare Other | Source: Ambulatory Visit | Attending: Nurse Practitioner | Admitting: Nurse Practitioner

## 2019-12-02 DIAGNOSIS — Z87891 Personal history of nicotine dependence: Secondary | ICD-10-CM | POA: Insufficient documentation

## 2019-12-07 ENCOUNTER — Encounter: Payer: Self-pay | Admitting: *Deleted

## 2019-12-12 ENCOUNTER — Other Ambulatory Visit: Payer: Self-pay | Admitting: Podiatry

## 2020-01-17 ENCOUNTER — Ambulatory Visit (INDEPENDENT_AMBULATORY_CARE_PROVIDER_SITE_OTHER): Payer: Medicare Other | Admitting: Podiatry

## 2020-01-17 ENCOUNTER — Other Ambulatory Visit: Payer: Self-pay

## 2020-01-17 DIAGNOSIS — M79676 Pain in unspecified toe(s): Secondary | ICD-10-CM

## 2020-01-17 DIAGNOSIS — E0843 Diabetes mellitus due to underlying condition with diabetic autonomic (poly)neuropathy: Secondary | ICD-10-CM | POA: Diagnosis not present

## 2020-01-17 DIAGNOSIS — B351 Tinea unguium: Secondary | ICD-10-CM

## 2020-01-17 DIAGNOSIS — M722 Plantar fascial fibromatosis: Secondary | ICD-10-CM

## 2020-01-22 NOTE — Progress Notes (Signed)
    Subjective: Patient is a 74 y.o. female presenting to the office today for follow up evaluation of painful callus lesion(s) noted to the bilateral feet. Walking and bearing weight increases the pain. She has not had any treatment for the symptoms.  Patient also complains of elongated, thickened nails that cause pain while ambulating in shoes. She is unable to trim her own nails.  She is also here for follow up evaluation of plantar fasciitis of the bilateral feet. She states the pain has been sharp and ongoing since her last visit three months ago. She has been taking Meloxicam for treatment. Being on the feet increases the pain. Patient presents today for further treatment and evaluation.   Past Medical History:  Diagnosis Date  . AF (atrial fibrillation) ([place])   . AKI (acute kidney injury) ([place]) 07/21/2019  . Arthritis   . Asthma   . Chicken pox   . Clotting disorder ([place])   . Depression   . Diabetes mellitus without complication ([place])   . Diverticulosis   . Dysrhythmia   . GERD (gastroesophageal reflux disease)   . Glaucoma   . Hemorrhoids   . Hemorrhoids, internal   . HH (hiatus hernia)   . History of hiatal hernia   . History of kidney stones   . Hypertension   . Migraines   . Neuropathy   . Obesity   . Opiate use 03/16/2017  . Personal history of tobacco use, presenting hazards to health 06/18/2015  . Sleep apnea   . Tubular adenoma of colon     Objective:  Physical Exam General: Alert and oriented x3 in no acute distress  Dermatology: Hyperkeratotic lesion(s) present on the bilateral feet. Pain on palpation with a central nucleated core noted. Skin is warm, dry and supple bilateral lower extremities. Negative for open lesions or macerations. Nails are tender, long, thickened and dystrophic with subungual debris, consistent with onychomycosis, 1-5 bilateral. No signs of infection noted.  Vascular: Palpable pedal pulses bilaterally. No edema or erythema noted.  Capillary refill within normal limits.  Neurological: Epicritic and protective threshold diminished bilaterally.   Musculoskeletal Exam: Pain on palpation at the keratotic lesion(s) noted. Tenderness to palpation to the plantar aspect of the bilateral heels along the plantar fascia. Range of motion within normal limits bilateral. Muscle strength 5/5 in all groups bilateral.  Assessment: 1. Onychodystrophic nails 1-5 bilateral with hyperkeratosis of nails.  2. Onychomycosis of nail due to dermatophyte bilateral 3. Pre-ulcerative callus lesions noted to the bilateral feet x 2 4. Plantar fasciitis bilateral    Plan of Care:  1. Patient evaluated. 2. Excisional debridement of keratoic lesion(s) using a chisel blade was performed without incident.  3. Dressed with light dressing. 4. Mechanical debridement of nails 1-5 bilaterally performed using a nail nipper. Filed with dremel without incident.  5. Injection of 0.5 mLs Celestone Soluspan injected into the bilateral heels.  6. Patient is to return to the clinic in 3 months.   Edrick Kins, DPM [facility]  Dr. Edrick Kins, [address]                                        [place], [place] [address]                Office [phone]  Fax [phone]

## 2020-02-03 ENCOUNTER — Other Ambulatory Visit: Payer: Self-pay | Admitting: Student

## 2020-02-03 DIAGNOSIS — R1031 Right lower quadrant pain: Secondary | ICD-10-CM

## 2020-02-03 DIAGNOSIS — R1010 Upper abdominal pain, unspecified: Secondary | ICD-10-CM

## 2020-02-03 DIAGNOSIS — R1032 Left lower quadrant pain: Secondary | ICD-10-CM

## 2020-02-14 ENCOUNTER — Other Ambulatory Visit: Payer: Self-pay

## 2020-02-14 ENCOUNTER — Ambulatory Visit
Admission: RE | Admit: 2020-02-14 | Discharge: 2020-02-14 | Disposition: A | Discharge status: Home / Self Care | Payer: Medicare Other | Source: Ambulatory Visit | Attending: Student | Admitting: Student

## 2020-02-14 DIAGNOSIS — R1010 Upper abdominal pain, unspecified: Secondary | ICD-10-CM | POA: Insufficient documentation

## 2020-02-14 DIAGNOSIS — R1031 Right lower quadrant pain: Secondary | ICD-10-CM | POA: Diagnosis present

## 2020-02-14 DIAGNOSIS — R1032 Left lower quadrant pain: Secondary | ICD-10-CM | POA: Diagnosis present

## 2020-02-14 LAB — POCT I-STAT CREATININE: Creatinine, Ser: 0.6 mg/dL (ref 0.44–1.00)

## 2020-02-14 MED ORDER — IOHEXOL 300 MG/ML  SOLN
100.0000 mL | Freq: Once | INTRAMUSCULAR | Status: AC | PRN
  Administered 2020-02-14: 100 mL via INTRAVENOUS

## 2020-04-17 ENCOUNTER — Ambulatory Visit (INDEPENDENT_AMBULATORY_CARE_PROVIDER_SITE_OTHER): Payer: Medicare Other | Admitting: Podiatry

## 2020-04-17 ENCOUNTER — Other Ambulatory Visit: Payer: Self-pay

## 2020-04-17 DIAGNOSIS — M79674 Pain in right toe(s): Secondary | ICD-10-CM

## 2020-04-17 DIAGNOSIS — L989 Disorder of the skin and subcutaneous tissue, unspecified: Secondary | ICD-10-CM

## 2020-04-17 DIAGNOSIS — B351 Tinea unguium: Secondary | ICD-10-CM

## 2020-04-17 DIAGNOSIS — M79675 Pain in left toe(s): Secondary | ICD-10-CM | POA: Diagnosis not present

## 2020-04-17 DIAGNOSIS — E0843 Diabetes mellitus due to underlying condition with diabetic autonomic (poly)neuropathy: Secondary | ICD-10-CM

## 2020-04-17 NOTE — Progress Notes (Signed)
   SUBJECTIVE Patient with a history of diabetes mellitus presents to office today complaining of elongated, thickened nails that cause pain while ambulating in shoes.  She is unable to trim her own nails.  Patient also complains of symptomatic callus lesions noted to the bilateral feet.  They are very painful with shoe gear and walking.  Patient is here for further evaluation and treatment.   Past Medical History:  Diagnosis Date  . AF (atrial fibrillation) ([place])   . AKI (acute kidney injury) ([place]) 07/21/2019  . Arthritis   . Asthma   . Chicken pox   . Clotting disorder ([place])   . Depression   . Diabetes mellitus without complication ([place])   . Diverticulosis   . Dysrhythmia   . GERD (gastroesophageal reflux disease)   . Glaucoma   . Hemorrhoids   . Hemorrhoids, internal   . HH (hiatus hernia)   . History of hiatal hernia   . History of kidney stones   . Hypertension   . Migraines   . Neuropathy   . Obesity   . Opiate use 03/16/2017  . Personal history of tobacco use, presenting hazards to health 06/18/2015  . Sleep apnea   . Tubular adenoma of colon     OBJECTIVE General Patient is awake, alert, and oriented x 3 and in no acute distress. Derm Skin is dry and supple bilateral. Negative open lesions or macerations. Remaining integument unremarkable. Nails are tender, long, thickened and dystrophic with subungual debris, consistent with onychomycosis, 1-5 bilateral. No signs of infection noted.  Hyperkeratotic callus tissue noted to the plantar aspect of the bilateral forefoot with sensitivity to palpation. Vasc  DP and PT pedal pulses palpable bilaterally. Temperature gradient within normal limits.  Neuro Epicritic and protective threshold sensation diminished bilaterally.  Musculoskeletal Exam No symptomatic pedal deformities noted bilateral. Muscular strength within normal limits.  ASSESSMENT 1. Diabetes Mellitus w/ peripheral neuropathy 2. Onychomycosis of nail due to  dermatophyte bilateral 3.  Preulcerative callus lesions bilateral feet  4.  Pain in foot bilateral  PLAN OF CARE 1. Patient evaluated today. 2. Instructed to maintain good pedal hygiene and foot care. Stressed importance of controlling blood sugar.  3. Mechanical debridement of nails 1-5 bilaterally performed using a nail nipper. Filed with dremel without incident.  4.  Excisional debridement of the hyperkeratotic callus tissue was performed using a chisel blade without incident or bleeding  5.  Recommend good supportive sneakers return to clinic in 3 mos.     Edrick Kins, DPM [facility]  Dr. Edrick Kins, [address]                                        [place], [place] [address]                Office [phone]  Fax [phone]

## 2020-07-16 NOTE — Progress Notes (Signed)
PROVIDER NOTE: Information contained herein reflects review and annotations entered in association with encounter. Interpretation of such information and data should be left to medically-trained personnel. Information provided to patient can be located elsewhere in the medical record under "Patient Instructions". Document created using STT-dictation technology, any transcriptional errors that may result from process are unintentional.    Patient: Lynn Potter  Service Category: E/M  Provider: Gaspar Cola, MD  DOB: 1946-03-09  DOS: 07/17/2020  Specialty: Interventional Pain Management  MRN: [id number]  Setting: Ambulatory outpatient  PCP: Remi Haggard, FNP  Type: Established Patient    Referring Provider: Remi Haggard, FNP  Location: Office  Delivery: Face-to-face     HPI  [name], a 74 y.o. year old female, is here today because of her Chronic pain syndrome [G89.4]. Ms. Wisher primary complain today is Back Pain (left, lower) and Hip Pain (right) Last encounter: My last encounter with her was on Visit date not found. Pertinent problems: Ms. Manges has Osteoarthritis of shoulder (Bilateral); Osteoarthritis of shoulder (Right); Rotator cuff tendinitis; History of rotator cuff surgery (Bilateral); Chronic pain syndrome; Degenerative joint disease (DJD) of hip; Sacroiliac joint pain; Chronic low back pain (1ry area of Pain) (Bilateral) (L>R) w/ sciatica (Left); Chronic lower extremity pain (2ry area of Pain) (Left); Chronic shoulder pain (3ry area of Pain) (Bilateral) (R>L); Chronic hip pain (Left); Osteoarthritis of hip (Left); Lumbar facet syndrome (Bilateral) (L>R); Spondylosis without myelopathy or radiculopathy, lumbosacral region; Chronic low back pain (1ry area of Pain) (Bilateral) (L>R) w/o sciatica; Abnormal CT of  lumbar spine (10/11/2018); Lumbar facet hypertrophy (Multilevel); DDD (degenerative disc disease), lumbar; Failed back surgical syndrome; 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 Pain Assessment: Severity of Chronic pain is reported as a 10-Worst pain ever/10. Location: Back Left, Lower/left leg to the foot. Onset: More than a month ago. Quality: Stabbing, Sharp. Timing: Constant. Modifying factor(s): nothing. Vitals:  height is _0  (1.651 m) and weight is 188 lb (85.3 kg). Her temporal temperature is 98.4 F (36.9 C). Her blood pressure is 155/102 (abnormal) and her pulse is 75. Her respiration is 16 and oxygen saturation is 100%.   Reason for encounter: worsening of previously known (established) problem.  The patient comes in today requesting an evaluation for possible interventional procedure.  The patient comes into the clinic today with a flareup of her low back pain and lower extremity pain.  She indicates that the low back pain is worse than the lower extremity pain.  The low back pain is bilateral but with the left side being worse than the right.  In both instances the pain seems to go to the area of the hip but the left hip seems to be more painful.  In the case of her left lower extremity pain that goes all the way down through the top of the foot into her big toe and what appears to be an L5 dermatomal distribution.  The patient recently had an MRI done around October of last year which demonstrated severe L3-4 spinal stenosis with a 2 mm anterolisthesis of L3-4.  Today I will be ordering x-rays of her lumbar spine on flexion and extension to evaluate on whether or not this anterolisthesis is stable.  Due to the fact that that MRI  showed the stenosis to be severe and she is having recurrence of her pain, I will also be getting a neurosurgical consult for a possible surgical evaluation and to see  if she may be a good candidate for decompression at that level.  According to the patient she has had prior surgery by Dr. Mauri Pole, many years ago when he was still working at the [facility].   According to the patient her current pain he is a cycle is same as the one that she had last year which I treated with a series of 3 left-sided L3 transforaminal epidural steroid injections + right sided L2-3 LESI.  With this the patient attained excellent relief of the pain until recently when it returned.  Today we have talked to the patient about repeating those injections and getting the neurosurgical consult as well as the x-ray on flexion and extension.  We will try to do the injections as soon as possible.  Pharmacotherapy Assessment   Analgesic: No opioid analgesics from our practice.    Monitoring: [place] PMP: PDMP reviewed during this encounter.       Pharmacotherapy: No side-effects or adverse reactions reported. Compliance: No problems identified. Effectiveness: Clinically acceptable.  Landis Martins, RN  07/17/2020 12:51 PM  Sign when Signing Visit Safety precautions to be maintained throughout the outpatient stay will include: orient to surroundings, keep bed in low position, maintain call bell within reach at all times, provide assistance with transfer out of bed and ambulation.     UDS:  Summary  Date Value Ref Range Status  11/09/2018 FINAL  Final    Comment:    ==================================================================== [facility]RE COMP DRUG ANALYSIS,UR ==================================================================== Test                             Result       Flag       Units Drug Present and Declared for Prescription Verification   Pregabalin                     PRESENT      EXPECTED   Baclofen                       PRESENT      EXPECTED   Methocarbamol                  PRESENT      EXPECTED   Hydroxyzine                    PRESENT      EXPECTED Drug Absent but  Declared for Prescription Verification   Salicylate                     Not Detected UNEXPECTED    Aspirin, as indicated in the declared medication list, is not    always detected even when used as directed. ==================================================================== Test                      Result    Flag   Units      Ref Range   Creatinine              58               mg/dL      >=20 ==================================================================== Declared Medications:  The flagging and interpretation on this  report are based on the  following declared medications.  Unexpected results may arise from  inaccuracies in the declared medications.  **Note: The testing scope of this panel includes these medications:  Baclofen  Hydroxyzine  Methocarbamol  Pregabalin (Lyrica)  **Note: The testing scope of this panel does not include small to  moderate amounts of these reported medications:  Aspirin (Aspirin 81)  **Note: The testing scope of this panel does not include following  reported medications:  Amlodipine  Ascorbic Acid  Atorvastatin  Azelastine  Benazepril  Cholecalciferol  Empagliflozin (Jardiance)  Fluconazole (Diflucan)  Insulin  Iron (Ferrous Sulfate)  Latanoprost  Metformin  Olmesartan (Benicar)  Pantoprazole  Ropinirole (Requip) ==================================================================== For clinical consultation, please call [phone]. ====================================================================      ROS  Constitutional: Denies any fever or chills Gastrointestinal: No reported hemesis, hematochezia, vomiting, or acute GI distress Musculoskeletal: Denies any acute onset joint swelling, redness, loss of ROM, or weakness Neurological: No reported episodes of acute onset apraxia, aphasia, dysarthria, agnosia, amnesia, paralysis, loss of coordination, or loss of consciousness  Medication Review  Cholecalciferol, Fish Oil,  Ipratropium-Albuterol, amLODipine, aspirin, atorvastatin, azelastine, baclofen, cyanocobalamin, empagliflozin, ferrous sulfate, folic acid, hydrOXYzine, insulin detemir, insulin lispro, latanoprost, meloxicam, metFORMIN, olmesartan-hydrochlorothiazide, pantoprazole, pregabalin, rOPINIRole, varenicline, and vitamin C  History Review  Allergy: Ms. Lantigua has No Known Allergies. Drug: Ms. Jeancharles  reports no history of drug use. Alcohol:  reports no history of alcohol use. Tobacco:  reports that she has been smoking cigarettes. She has been smoking about 1.00 pack per day. She has never used smokeless tobacco. Social: Ms. Games  reports that she has been smoking cigarettes. She has been smoking about 1.00 pack per day. She has never used smokeless tobacco. She reports that she does not drink alcohol and does not use drugs. Medical:  has a past medical history of AF (atrial fibrillation) ([place]), AKI (acute kidney injury) ([place]) (07/21/2019), Arthritis, Asthma, Chicken pox, Clotting disorder ([place]), Depression, Diabetes mellitus without complication ([place]), Diverticulosis, Dysrhythmia, GERD (gastroesophageal reflux disease), Glaucoma, Hemorrhoids, Hemorrhoids, internal, HH (hiatus hernia), History of hiatal hernia, History of kidney stones, Hypertension, Migraines, Neuropathy, Obesity, Opiate use (03/16/2017), Personal history of tobacco use, presenting hazards to health (06/18/2015), Sleep apnea, and Tubular adenoma of colon. Surgical: Ms. Gandolfi  has a past surgical history that includes Total knee arthroplasty; Abdominal hysterectomy; Cholecystectomy; Colonoscopy with esophagogastroduodenoscopy (egd); carpal tunnell; Eye surgery; Cataract extraction; Back surgery; Lumbar fusion; Upper esophageal endoscopic ultrasound (eus) (N/A, 01/03/2016); Esophagogastroduodenoscopy (egd) with propofol (N/A, 02/16/2017); Carpal tunnel release; REPAIR ROTATOR CUFF TEAR; Joint replacement (Bilateral, 1999); Esophagogastroduodenoscopy  (egd) with propofol (N/A, 08/03/2019); and Colonoscopy with propofol (N/A, 08/03/2019). Family: family history includes Diabetes in her mother and sister.  Laboratory Chemistry Profile   Renal Lab Results  Component Value Date   BUN 18 07/23/2019   CREATININE 0.60 02/14/2020   BCR 10 (L) 11/09/2018   GFRAA >60 07/23/2019   GFRNONAA >60 07/23/2019     Hepatic Lab Results  Component Value Date   AST 11 (L) 07/22/2019   ALT 10 07/22/2019   ALBUMIN 2.8 (L) 07/22/2019   ALKPHOS 57 07/22/2019   LIPASE 21 05/23/2018     Electrolytes Lab Results  Component Value Date   NA 138 07/23/2019   K 3.7 07/23/2019   CL 107 07/23/2019   CALCIUM 7.9 (L) 07/23/2019   MG 2.1 07/22/2019     Bone Lab Results  Component Value Date   25OHVITD1 33 11/29/2018   25OHVITD2 2.5 11/29/2018  25OHVITD3 30 11/29/2018     Inflammation (CRP: Acute Phase) (ESR: Chronic Phase) Lab Results  Component Value Date   CRP 16 (H) 11/09/2018   ESRSEDRATE 77 (H) 11/09/2018   LATICACIDVEN 1.9 07/21/2019       Note: Above Lab results reviewed.  Recent Imaging Review  CT ABDOMEN PELVIS W CONTRAST CLINICAL DATA:  Mid to lower abdominal pain over the last several years.  EXAM: CT ABDOMEN AND PELVIS WITH CONTRAST  TECHNIQUE: Multidetector CT imaging of the abdomen and pelvis was performed using the standard protocol following bolus administration of intravenous contrast.  CONTRAST:  136m OMNIPAQUE IOHEXOL 300 MG/ML  SOLN  COMPARISON:  05/23/2018  FINDINGS: Lower chest: Lung bases clear. Hiatal hernia containing fat and a small amount of the stomach.  Hepatobiliary: Previous cholecystectomy. No liver parenchymal lesion.  Pancreas: Normal  Spleen: Normal  Adrenals/Urinary Tract: Adrenal glands are normal. Kidneys are normal. Bladder is normal.  Stomach/Bowel: No evidence of bowel obstruction or ileus. Patient has chronic diverticulosis of the sigmoid colon region but without evidence  of active diverticulitis.  Vascular/Lymphatic: Aortic atherosclerosis. No aneurysm. IVC is normal. No retroperitoneal adenopathy.  Reproductive: Previous hysterectomy. Residual ovaries. No pelvic mass.  Other: No free fluid or air. Previous anterior abdominal wall surgery, possibly hernia repair. No evidence of residual or recurrent hernia.  Musculoskeletal: Previous lumbosacral spine fusion surgery. Adjacent segment degenerative disease at L3-4 could be painful.  IMPRESSION: No acute abdominal or pelvic pathology.  Previous cholecystectomy and hysterectomy.  Hiatal hernia containing fat and a small amount of the stomach.  Previous anterior abdominal wall surgery, possibly mesh repair of hernia. No evidence of residual or recurrent hernia.  Chronic sigmoid diverticulosis without evidence of acute diverticulitis.  Lumbosacral fusion surgeries. Adjacent segment degenerative disease at L3-4 that could be painful.  Electronically Signed   By: MNelson ChimesM.D.   On: 02/15/2020 09:00 Note: Reviewed        Physical Exam  General appearance: Well nourished, well developed, and well hydrated. In no apparent acute distress Mental status: Alert, oriented x 3 (person, place, & time)       Respiratory: No evidence of acute respiratory distress Eyes: PERLA Vitals: BP (!) 155/102   Pulse 75   Temp 98.4 F (36.9 C) (Temporal)   Resp 16   Ht _0  (1.651 m)   Wt 188 lb (85.3 kg)   SpO2 100%   BMI 31.28 kg/m  BMI: Estimated body mass index is 31.28 kg/m as calculated from the following:   Height as of this encounter: _1  (1.651 m).   Weight as of this encounter: 188 lb (85.3 kg). Ideal: Ideal body weight: 57 kg (125 lb 10.6 oz) Adjusted ideal body weight: 68.3 kg (150 lb 9.6 oz)  Assessment   Status Diagnosis  Controlled Recurring Recurring 1. Chronic pain syndrome   2. Chronic low back pain (1ry area of Pain) (Bilateral) (L>R) w/o sciatica   3. Chronic lower  extremity pain (2ry area of Pain) (Left)   4. Chronic shoulder pain (3ry area of Pain) (Bilateral) (R>L)   5. Lumbar facet syndrome (Bilateral) (L>R)   6. DDD (degenerative disc disease), lumbar   7. 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), w/ neurogenic claudication   8. Grade 1 Anterolisthesis of lumbar spine (268m (L3-4)      Updated Problems: No problems updated.  Plan of Care  Problem-specific:  No problem-specific Assessment & Plan notes found for this encounter.  Ms. RoMISHA VANOVERBEKE  has a current medication list which includes the following long-term medication(s): ferrous sulfate, insulin lispro, levemir flextouch, pregabalin, and atorvastatin.  Pharmacotherapy (Medications Ordered): No orders of the defined types were placed in this encounter.  Orders:  Orders Placed This Encounter  Procedures  . Lumbar Epidural Injection    Standing Status:   Future    Standing Expiration Date:   08/17/2020    Scheduling Instructions:     Procedure: Interlaminar Lumbar Epidural Steroid injection (LESI)  L2-3     Laterality: Right-sided     Sedation: Patient's choice.     Timeframe: ASAA    Order Specific Question:   Where will this procedure be performed?    Answer:   [facility]  . Lumbar Transforaminal Epidural    Standing Status:   Future    Standing Expiration Date:   08/17/2020    Scheduling Instructions:     Side: Left-sided     Level: L3     Sedation: Patient's choice.     Timeframe: ASAP    Order Specific Question:   Where will this procedure be performed?    Answer:   [facility]  . DG Lumbar Spine Complete W/Bend    Patient presents with axial pain with possible radicular component.  In addition to any acute findings, please report on:  1. Facet (Zygapophyseal) joint DJD (Hypertrophy, space narrowing, subchondral sclerosis, and/or osteophyte formation) 2. DDD and/or IVDD (Loss of disc height, desiccation or "Black disc disease") 3. Pars  defects 4. Spondylolisthesis, spondylosis, and/or spondyloarthropathies (include Degree/Grade of displacement in mm) 5. Vertebral body Fractures, including age (old, new/acute) 29. Modic Type Changes 7. Demineralization 8. Bone pathology 9. Central, Lateral Recess, and/or Foraminal Stenosis (include AP diameter of stenosis in mm) 10. Surgical changes (hardware type, status, and presence of fibrosis)  NOTE: Please specify level(s) and laterality. If applicable: Please indicate ROM and/or evidence of instability (>15m displacement between flexion and extension views)    Standing Status:   Future    Number of Occurrences:   1    Standing Expiration Date:   08/17/2020    Scheduling Instructions:     Imaging must be done as soon as possible. Inform patient that order will expire within 30 days and I will not renew it.    Order Specific Question:   Reason for Exam (SYMPTOM  OR DIAGNOSIS REQUIRED)    Answer:   Low back pain    Order Specific Question:   Preferred imaging location?    Answer:   [facility]    Order Specific Question:   Call Results- Best Contact Number?    Answer:   [phone]([facility]    Order Specific Question:   Radiology Contrast Protocol - do NOT remove file path    Answer:   [email or web address]    Order Specific Question:   Release to patient    Answer:   Immediate  . Ambulatory referral to Neurosurgery    Referral Priority:   Routine    Referral Type:   Surgical    Referral Reason:   Specialty Services Required    Requested Specialty:   Neurosurgery    Number of Visits Requested:   1   Follow-up plan:   Return for Procedure (w/ sedation): (L) L3 TFESI + (R) L2-3 LESI #1/S2.      Interventional management options: Considering: Diagnosticright L3-4LESI Diagnostic bilateral L3 transforaminal LESI Diagnostic bilateral lumbar facet block#2 Possible bilateral lumbar facetRFA Diagnostic  bilateralsuprascapularnerve  block Possible bilateral suprascapularRFA Diagnostic bilateral T11 transforaminal TESI   Palliative PRN treatment(s): Palliative bilateral lumbar facet block #2 Diagnostic/therapeutic left L3 TFESI #4 + right L2-3 LESI #4 (100/100/100/>50)    Recent Visits Date Type Provider Dept  07/17/20 Office Visit Milinda Pointer, MD [facility]  Showing recent visits within past 90 days and meeting all other requirements Future Appointments Date Type Provider Dept  07/26/20 Appointment Milinda Pointer, MD [facility]  Showing future appointments within next 90 days and meeting all other requirements  I discussed the assessment and treatment plan with the patient. The patient was provided an opportunity to ask questions and all were answered. The patient agreed with the plan and demonstrated an understanding of the instructions.  Patient advised to call back or seek an in-person evaluation if the symptoms or condition worsens.  Duration of encounter: 30 minutes.  Note by: Gaspar Cola, MD Date: 07/17/2020; Time: 6:19 PM

## 2020-07-17 ENCOUNTER — Ambulatory Visit
Admission: RE | Admit: 2020-07-17 | Discharge: 2020-07-17 | Disposition: A | Discharge status: Home / Self Care | Payer: Medicare Other | Attending: Pain Medicine | Admitting: Pain Medicine

## 2020-07-17 ENCOUNTER — Ambulatory Visit
Admission: RE | Admit: 2020-07-17 | Discharge: 2020-07-17 | Disposition: A | Discharge status: Home / Self Care | Payer: Medicare Other | Source: Ambulatory Visit | Attending: Pain Medicine | Admitting: Pain Medicine

## 2020-07-17 ENCOUNTER — Encounter: Payer: Self-pay | Admitting: Pain Medicine

## 2020-07-17 ENCOUNTER — Ambulatory Visit (HOSPITAL_BASED_OUTPATIENT_CLINIC_OR_DEPARTMENT_OTHER): Payer: Medicare Other | Admitting: Pain Medicine

## 2020-07-17 ENCOUNTER — Other Ambulatory Visit: Payer: Self-pay

## 2020-07-17 VITALS — BP 155/102 | HR 75 | Temp 98.4°F | Resp 16 | Ht 65.0 in | Wt 188.0 lb

## 2020-07-17 DIAGNOSIS — M25511 Pain in right shoulder: Secondary | ICD-10-CM | POA: Insufficient documentation

## 2020-07-17 DIAGNOSIS — M25512 Pain in left shoulder: Secondary | ICD-10-CM

## 2020-07-17 DIAGNOSIS — M48062 Spinal stenosis, lumbar region with neurogenic claudication: Secondary | ICD-10-CM

## 2020-07-17 DIAGNOSIS — M51369 Other intervertebral disc degeneration, lumbar region without mention of lumbar back pain or lower extremity pain: Secondary | ICD-10-CM

## 2020-07-17 DIAGNOSIS — M4316 Spondylolisthesis, lumbar region: Secondary | ICD-10-CM

## 2020-07-17 DIAGNOSIS — M47816 Spondylosis without myelopathy or radiculopathy, lumbar region: Secondary | ICD-10-CM | POA: Insufficient documentation

## 2020-07-17 DIAGNOSIS — M79605 Pain in left leg: Secondary | ICD-10-CM | POA: Insufficient documentation

## 2020-07-17 DIAGNOSIS — M5136 Other intervertebral disc degeneration, lumbar region: Secondary | ICD-10-CM | POA: Insufficient documentation

## 2020-07-17 DIAGNOSIS — G894 Chronic pain syndrome: Secondary | ICD-10-CM | POA: Insufficient documentation

## 2020-07-17 DIAGNOSIS — M545 Low back pain, unspecified: Secondary | ICD-10-CM | POA: Insufficient documentation

## 2020-07-17 DIAGNOSIS — G8929 Other chronic pain: Secondary | ICD-10-CM | POA: Insufficient documentation

## 2020-07-17 NOTE — Patient Instructions (Addendum)
____________________________________________________________________________________________  Preparing for Procedure with Sedation  Procedure appointments are limited to planned procedures: . No Prescription Refills. . No disability issues will be discussed. . No medication changes will be discussed.  Instructions: . Oral Intake: Do not eat or drink anything for at least 8 hours prior to your procedure. (Exception: Blood Pressure Medication. See below.) . Transportation: Unless otherwise stated by your physician, you may drive yourself after the procedure. . Blood Pressure Medicine: Do not forget to take your blood pressure medicine with a sip of water the morning of the procedure. If your Diastolic (lower reading)is above 100 mmHg, elective cases will be cancelled/rescheduled. . Blood thinners: These will need to be stopped for procedures. Notify our staff if you are taking any blood thinners. Depending on which one you take, there will be specific instructions on how and when to stop it. . Diabetics on insulin: Notify the staff so that you can be scheduled 1st case in the morning. If your diabetes requires high dose insulin, take only  of your normal insulin dose the morning of the procedure and notify the staff that you have done so. . Preventing infections: Shower with an antibacterial soap the morning of your procedure. . Build-up your immune system: Take 1000 mg of Vitamin C with every meal (3 times a day) the day prior to your procedure. . Antibiotics: Inform the staff if you have a condition or reason that requires you to take antibiotics before dental procedures. . Pregnancy: If you are pregnant, call and cancel the procedure. . Sickness: If you have a cold, fever, or any active infections, call and cancel the procedure. . Arrival: You must be in the facility at least 30 minutes prior to your scheduled procedure. . Children: Do not bring children with you. . Dress appropriately:  Bring dark clothing that you would not mind if they get stained. . Valuables: Do not bring any jewelry or valuables.  Reasons to call and reschedule or cancel your procedure: (Following these recommendations will minimize the risk of a serious complication.) . Surgeries: Avoid having procedures within 2 weeks of any surgery. (Avoid for 2 weeks before or after any surgery). . Flu Shots: Avoid having procedures within 2 weeks of a flu shots or . (Avoid for 2 weeks before or after immunizations). . Barium: Avoid having a procedure within 7-10 days after having had a radiological study involving the use of radiological contrast. (Myelograms, Barium swallow or enema study). . Heart attacks: Avoid any elective procedures or surgeries for the initial 6 months after a "Myocardial Infarction" (Heart Attack). . Blood thinners: It is imperative that you stop these medications before procedures. Let us know if you if you take any blood thinner.  . Infection: Avoid procedures during or within two weeks of an infection (including chest colds or gastrointestinal problems). Symptoms associated with infections include: Localized redness, fever, chills, night sweats or profuse sweating, burning sensation when voiding, cough, congestion, stuffiness, runny nose, sore throat, diarrhea, nausea, vomiting, cold or Flu symptoms, recent or current infections. It is specially important if the infection is over the area that we intend to treat. . Heart and lung problems: Symptoms that may suggest an active cardiopulmonary problem include: cough, chest pain, breathing difficulties or shortness of breath, dizziness, ankle swelling, uncontrolled high or unusually low blood pressure, and/or palpitations. If you are experiencing any of these symptoms, cancel your procedure and contact your primary care physician for an evaluation.  Remember:  Regular Business hours are:    Monday to Thursday 8:00 AM to 4:00 PM  Provider's  Schedule: Bernice Mcauliffe, MD:  Procedure days: Tuesday and Thursday 7:30 AM to 4:00 PM  Bilal Lateef, MD:  Procedure days: Monday and Wednesday 7:30 AM to 4:00 PM ____________________________________________________________________________________________   ____________________________________________________________________________________________  General Risks and Possible Complications  Patient Responsibilities: It is important that you read this as it is part of your informed consent. It is our duty to inform you of the risks and possible complications associated with treatments offered to you. It is your responsibility as a patient to read this and to ask questions about anything that is not clear or that you believe was not covered in this document.  Patient's Rights: You have the right to refuse treatment. You also have the right to change your mind, even after initially having agreed to have the treatment done. However, under this last option, if you wait until the last second to change your mind, you may be charged for the materials used up to that point.  Introduction: Medicine is not an exact science. Everything in Medicine, including the lack of treatment(s), carries the potential for danger, harm, or loss (which is by definition: Risk). In Medicine, a complication is a secondary problem, condition, or disease that can aggravate an already existing one. All treatments carry the risk of possible complications. The fact that a side effects or complications occurs, does not imply that the treatment was conducted incorrectly. It must be clearly understood that these can happen even when everything is done following the highest safety standards.  No treatment: You can choose not to proceed with the proposed treatment alternative. The "PRO(s)" would include: avoiding the risk of complications associated with the therapy. The "CON(s)" would include: not getting any of the treatment  benefits. These benefits fall under one of three categories: diagnostic; therapeutic; and/or palliative. Diagnostic benefits include: getting information which can ultimately lead to improvement of the disease or symptom(s). Therapeutic benefits are those associated with the successful treatment of the disease. Finally, palliative benefits are those related to the decrease of the primary symptoms, without necessarily curing the condition (example: decreasing the pain from a flare-up of a chronic condition, such as incurable terminal cancer).  General Risks and Complications: These are associated to most interventional treatments. They can occur alone, or in combination. They fall under one of the following six (6) categories: no benefit or worsening of symptoms; bleeding; infection; nerve damage; allergic reactions; and/or death. 1. No benefits or worsening of symptoms: In Medicine there are no guarantees, only probabilities. No healthcare provider can ever guarantee that a medical treatment will work, they can only state the probability that it may. Furthermore, there is always the possibility that the condition may worsen, either directly, or indirectly, as a consequence of the treatment. 2. Bleeding: This is more common if the patient is taking a blood thinner, either prescription or over the counter (example: Goody Powders, Fish oil, Aspirin, Garlic, etc.), or if suffering a condition associated with impaired coagulation (example: Hemophilia, cirrhosis of the liver, low platelet counts, etc.). However, even if you do not have one on these, it can still happen. If you have any of these conditions, or take one of these drugs, make sure to notify your treating physician. 3. Infection: This is more common in patients with a compromised immune system, either due to disease (example: diabetes, cancer, human immunodeficiency virus [HIV], etc.), or due to medications or treatments (example: therapies used to treat  cancer and   rheumatological diseases). However, even if you do not have one on these, it can still happen. If you have any of these conditions, or take one of these drugs, make sure to notify your treating physician. 4. Nerve Damage: This is more common when the treatment is an invasive one, but it can also happen with the use of medications, such as those used in the treatment of cancer. The damage can occur to small secondary nerves, or to large primary ones, such as those in the spinal cord and brain. This damage may be temporary or permanent and it may lead to impairments that can range from temporary numbness to permanent paralysis and/or brain death. 5. Allergic Reactions: Any time a substance or material comes in contact with our body, there is the possibility of an allergic reaction. These can range from a mild skin rash (contact dermatitis) to a severe systemic reaction (anaphylactic reaction), which can result in death. 6. Death: In general, any medical intervention can result in death, most of the time due to an unforeseen complication. ____________________________________________________________________________________________ A referral to Neurosurgery was ordered. Please get your x-ray as soon as possible.

## 2020-07-17 NOTE — Progress Notes (Signed)
Safety precautions to be maintained throughout the outpatient stay will include: orient to surroundings, keep bed in low position, maintain call bell within reach at all times, provide assistance with transfer out of bed and ambulation.  

## 2020-07-20 ENCOUNTER — Ambulatory Visit (INDEPENDENT_AMBULATORY_CARE_PROVIDER_SITE_OTHER): Payer: Medicare Other | Admitting: Podiatry

## 2020-07-20 ENCOUNTER — Encounter: Payer: Self-pay | Admitting: Podiatry

## 2020-07-20 ENCOUNTER — Other Ambulatory Visit: Payer: Self-pay

## 2020-07-20 DIAGNOSIS — L989 Disorder of the skin and subcutaneous tissue, unspecified: Secondary | ICD-10-CM | POA: Diagnosis not present

## 2020-07-20 DIAGNOSIS — M79675 Pain in left toe(s): Secondary | ICD-10-CM

## 2020-07-20 DIAGNOSIS — E0843 Diabetes mellitus due to underlying condition with diabetic autonomic (poly)neuropathy: Secondary | ICD-10-CM | POA: Diagnosis not present

## 2020-07-20 DIAGNOSIS — M79674 Pain in right toe(s): Secondary | ICD-10-CM

## 2020-07-20 DIAGNOSIS — B351 Tinea unguium: Secondary | ICD-10-CM | POA: Diagnosis not present

## 2020-07-20 NOTE — Progress Notes (Signed)
   SUBJECTIVE Patient with a history of diabetes mellitus presents to office today complaining of elongated, thickened nails that cause pain while ambulating in shoes.  She is unable to trim her own nails.  Patient states that there is no new complaints at this time and she is here for the routine foot care.  Past Medical History:  Diagnosis Date   AF (atrial fibrillation) (HCC)    AKI (acute kidney injury) (HCC) 07/21/2019   Arthritis    Asthma    Chicken pox    Clotting disorder (HCC)    Depression    Diabetes mellitus without complication (HCC)    Diverticulosis    Dysrhythmia    GERD (gastroesophageal reflux disease)    Glaucoma    Hemorrhoids    Hemorrhoids, internal    HH (hiatus hernia)    History of hiatal hernia    History of kidney stones    Hypertension    Migraines    Neuropathy    Obesity    Opiate use 03/16/2017   Personal history of tobacco use, presenting hazards to health 06/18/2015   Sleep apnea    Tubular adenoma of colon     OBJECTIVE General Patient is awake, alert, and oriented x 3 and in no acute distress. Derm Skin is dry and supple bilateral. Negative open lesions or macerations. Remaining integument unremarkable. Nails are tender, long, thickened and dystrophic with subungual debris, consistent with onychomycosis, 1-5 bilateral. No signs of infection noted.  Hyperkeratotic callus tissue noted to the plantar aspect of the bilateral forefoot with sensitivity to palpation. Vasc  DP and PT pedal pulses palpable bilaterally. Temperature gradient within normal limits.  Neuro Epicritic and protective threshold sensation diminished bilaterally.  Musculoskeletal Exam No symptomatic pedal deformities noted bilateral. Muscular strength within normal limits.  ASSESSMENT 1. Diabetes Mellitus w/ peripheral neuropathy 2. Onychomycosis of nail due to dermatophyte bilateral 3.  Preulcerative callus lesions bilateral feet  4.  Pain in foot bilateral  PLAN OF  CARE 1. Patient evaluated today. 2. Instructed to maintain good pedal hygiene and foot care. Stressed importance of controlling blood sugar.  3. Mechanical debridement of nails 1-5 bilaterally performed using a nail nipper. Filed with dremel without incident.  4.  Excisional debridement of the hyperkeratotic callus tissue was performed using a chisel blade without incident or bleeding  5.  Recommend good supportive sneakers return to clinic in 3 mos.     Refujio Haymer M. Rosalind Guido, DPM [facility]  Dr. Halla Chopp M. Kennady Zimmerle, DPM    [address]                                        [place], [place] [address]                Office [phone]  Fax [phone]      

## 2020-07-26 ENCOUNTER — Encounter: Payer: Self-pay | Admitting: Pain Medicine

## 2020-07-26 ENCOUNTER — Ambulatory Visit (HOSPITAL_BASED_OUTPATIENT_CLINIC_OR_DEPARTMENT_OTHER): Payer: Medicare Other | Admitting: Pain Medicine

## 2020-07-26 ENCOUNTER — Ambulatory Visit
Admission: RE | Admit: 2020-07-26 | Discharge: 2020-07-26 | Disposition: A | Discharge status: Home / Self Care | Payer: Medicare Other | Source: Ambulatory Visit | Attending: Pain Medicine | Admitting: Pain Medicine

## 2020-07-26 ENCOUNTER — Other Ambulatory Visit: Payer: Self-pay

## 2020-07-26 VITALS — BP 124/81 | HR 72 | Temp 98.1°F | Resp 11 | Ht 65.0 in | Wt 188.0 lb

## 2020-07-26 DIAGNOSIS — M5136 Other intervertebral disc degeneration, lumbar region: Secondary | ICD-10-CM | POA: Insufficient documentation

## 2020-07-26 DIAGNOSIS — M4316 Spondylolisthesis, lumbar region: Secondary | ICD-10-CM

## 2020-07-26 DIAGNOSIS — M961 Postlaminectomy syndrome, not elsewhere classified: Secondary | ICD-10-CM

## 2020-07-26 DIAGNOSIS — M79605 Pain in left leg: Secondary | ICD-10-CM | POA: Diagnosis present

## 2020-07-26 DIAGNOSIS — M51369 Other intervertebral disc degeneration, lumbar region without mention of lumbar back pain or lower extremity pain: Secondary | ICD-10-CM

## 2020-07-26 DIAGNOSIS — M48062 Spinal stenosis, lumbar region with neurogenic claudication: Secondary | ICD-10-CM | POA: Diagnosis present

## 2020-07-26 DIAGNOSIS — M5442 Lumbago with sciatica, left side: Secondary | ICD-10-CM | POA: Insufficient documentation

## 2020-07-26 DIAGNOSIS — G8929 Other chronic pain: Secondary | ICD-10-CM

## 2020-07-26 MED ORDER — MIDAZOLAM HCL 5 MG/5ML IJ SOLN
1.0000 mg | INTRAMUSCULAR | Status: DC | PRN
  Filled 2020-07-26: qty 5

## 2020-07-26 MED ORDER — SODIUM CHLORIDE 0.9% FLUSH
2.0000 mL | Freq: Once | INTRAVENOUS | Status: AC
  Administered 2020-07-26: 2 mL

## 2020-07-26 MED ORDER — LACTATED RINGERS IV SOLN
1000.0000 mL | Freq: Once | INTRAVENOUS | Status: AC
  Administered 2020-07-26: 1000 mL via INTRAVENOUS

## 2020-07-26 MED ORDER — ROPIVACAINE HCL 2 MG/ML IJ SOLN
2.0000 mL | Freq: Once | INTRAMUSCULAR | Status: AC
  Administered 2020-07-26: 2 mL via EPIDURAL
  Filled 2020-07-26: qty 10

## 2020-07-26 MED ORDER — SODIUM CHLORIDE 0.9% FLUSH
1.0000 mL | Freq: Once | INTRAVENOUS | Status: AC
  Administered 2020-07-26: 1 mL

## 2020-07-26 MED ORDER — TRIAMCINOLONE ACETONIDE 40 MG/ML IJ SUSP
40.0000 mg | Freq: Once | INTRAMUSCULAR | Status: AC
  Administered 2020-07-26: 40 mg
  Filled 2020-07-26: qty 1

## 2020-07-26 MED ORDER — ROPIVACAINE HCL 2 MG/ML IJ SOLN
1.0000 mL | Freq: Once | INTRAMUSCULAR | Status: AC
  Administered 2020-07-26: 1 mL via EPIDURAL

## 2020-07-26 MED ORDER — FENTANYL CITRATE (PF) 100 MCG/2ML IJ SOLN
25.0000 ug | INTRAMUSCULAR | Status: DC | PRN
  Administered 2020-07-26: 100 ug via INTRAVENOUS
  Filled 2020-07-26: qty 2

## 2020-07-26 MED ORDER — DEXAMETHASONE SODIUM PHOSPHATE 10 MG/ML IJ SOLN
10.0000 mg | Freq: Once | INTRAMUSCULAR | Status: AC
  Administered 2020-07-26: 10 mg
  Filled 2020-07-26: qty 1

## 2020-07-26 MED ORDER — LIDOCAINE HCL 2 % IJ SOLN
20.0000 mL | Freq: Once | INTRAMUSCULAR | Status: AC
  Administered 2020-07-26: 400 mg
  Filled 2020-07-26: qty 40

## 2020-07-26 MED ORDER — IOHEXOL 180 MG/ML  SOLN
10.0000 mL | Freq: Once | INTRAMUSCULAR | Status: AC
  Administered 2020-07-26: 10 mL via EPIDURAL

## 2020-07-26 NOTE — Progress Notes (Signed)
PROVIDER NOTE: Information contained herein reflects review and annotations entered in association with encounter. Interpretation of such information and data should be left to medically-trained personnel. Information provided to patient can be located elsewhere in the medical record under "Patient Instructions". Document created using STT-dictation technology, any transcriptional errors that may result from process are unintentional.    Patient: Lynn Potter  Service Category: Procedure  Provider: Gaspar Cola, MD  DOB: 1946-05-05  DOS: 07/26/2020  Location: [facility]  MRN: [id number]  Setting: Ambulatory - outpatient  Referring Provider: Remi Haggard, FNP  Type: Established Patient  Specialty: Interventional Pain Management  PCP: Remi Haggard, FNP   Primary Reason for Visit: Interventional Pain Management Treatment. CC: Back Pain (lumbar left )  Procedure #1:  Anesthesia, Analgesia, Anxiolysis:  Type: Palliative Trans-Foraminal Epidural Steroid Injection   #1/S2  Region: Lumbar Level: L3 Paravertebral Laterality: Left Paravertebral   Type: Moderate (Conscious) Sedation combined with Local Anesthesia Indication(s): Analgesia and Anxiety Route: Intravenous (IV) IV Access: Secured Sedation: Meaningful verbal contact was maintained at all times during the procedure  Local Anesthetic: Lidocaine 1-2%  Position: Prone  Procedure #2:    Type: Palliative Inter-Laminar Epidural Steroid Injection   #1/S2  Region: Lumbar Level: L2-3 Level. Laterality: Right Paramedial     Indications: 1. Failed back surgical syndrome   2. DDD (degenerative disc disease), lumbar   3. 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 Grade 1 Anterolisthesis of lumbar spine (94mm) (L3-4)   5. Chronic low back pain (1ry area of Pain) (Bilateral) (L>R) w/ sciatica (Left)   6. Chronic lower extremity pain (2ry area of Pain) (Left)    Pain  Score: Pre-procedure: 10-Worst pain ever/10 Post-procedure: 4 /10   Pre-op Assessment:  Lynn Potter is a 74 y.o. (year old), female patient, seen today for interventional treatment. She  has a past surgical history that includes Total knee arthroplasty; Abdominal hysterectomy; Cholecystectomy; Colonoscopy with esophagogastroduodenoscopy (egd); carpal tunnell; Eye surgery; Cataract extraction; Back surgery; Lumbar fusion; Upper esophageal endoscopic ultrasound (eus) (N/A, 01/03/2016); Esophagogastroduodenoscopy (egd) with propofol (N/A, 02/16/2017); Carpal tunnel release; REPAIR ROTATOR CUFF TEAR; Joint replacement (Bilateral, 1999); Esophagogastroduodenoscopy (egd) with propofol (N/A, 08/03/2019); and Colonoscopy with propofol (N/A, 08/03/2019). Lynn Potter has a current medication list which includes the following prescription(s): amlodipine, vitamin c, aspirin, azelastine, baclofen, chantix, cholecalciferol, combivent respimat, ferrous sulfate, folic acid, hydroxyzine, insulin lispro, jardiance, latanoprost, levemir flextouch, meloxicam, metformin, olmesartan-hydrochlorothiazide, fish oil, pantoprazole, pregabalin, ropinirole, trazodone, cyanocobalamin, and atorvastatin, and the following Facility-Administered Medications: fentanyl and midazolam. Her primarily concern today is the Back Pain (lumbar left )  Initial Vital Signs:  Pulse/HCG Rate: 72ECG Heart Rate: 92 Temp: 98.1 F (36.7 C) Resp: 16 BP: (!) 178/82 SpO2: 100 %  BMI: Estimated body mass index is 31.28 kg/m as calculated from the following:   Height as of this encounter: 5\' 5"  (1.651 m).   Weight as of this encounter: 188 lb (85.3 kg).  Risk Assessment: Allergies: Reviewed. She has No Known Allergies.  Allergy Precautions: None required Coagulopathies: Reviewed. None identified.  Blood-thinner therapy: None at this time Active Infection(s): Reviewed. None identified. Lynn Potter is afebrile  Site Confirmation: Lynn Potter was asked  to confirm the procedure and laterality before marking the site Procedure checklist: Completed Consent: Before the procedure and under the influence of no sedative(s), amnesic(s), or anxiolytics, the patient was informed of the treatment options, risks and possible complications. To fulfill our ethical and legal  obligations, as recommended by the American Medical Association's Code of Ethics, I have informed the patient of my clinical impression; the nature and purpose of the treatment or procedure; the risks, benefits, and possible complications of the intervention; the alternatives, including doing nothing; the risk(s) and benefit(s) of the alternative treatment(s) or procedure(s); and the risk(s) and benefit(s) of doing nothing. The patient was provided information about the general risks and possible complications associated with the procedure. These may include, but are not limited to: failure to achieve desired goals, infection, bleeding, organ or nerve damage, allergic reactions, paralysis, and death. In addition, the patient was informed of those risks and complications associated to Spine-related procedures, such as failure to decrease pain; infection (i.e.: Meningitis, epidural or intraspinal abscess); bleeding (i.e.: epidural hematoma, subarachnoid hemorrhage, or any other type of intraspinal or peri-dural bleeding); organ or nerve damage (i.e.: Any type of peripheral nerve, nerve root, or spinal cord injury) with subsequent damage to sensory, motor, and/or autonomic systems, resulting in permanent pain, numbness, and/or weakness of one or several areas of the body; allergic reactions; (i.e.: anaphylactic reaction); and/or death. Furthermore, the patient was informed of those risks and complications associated with the medications. These include, but are not limited to: allergic reactions (i.e.: anaphylactic or anaphylactoid reaction(s)); adrenal axis suppression; blood sugar elevation that in  diabetics may result in ketoacidosis or comma; water retention that in patients with history of congestive heart failure may result in shortness of breath, pulmonary edema, and decompensation with resultant heart failure; weight gain; swelling or edema; medication-induced neural toxicity; particulate matter embolism and blood vessel occlusion with resultant organ, and/or nervous system infarction; and/or aseptic necrosis of one or more joints. Finally, the patient was informed that Medicine is not an exact science; therefore, there is also the possibility of unforeseen or unpredictable risks and/or possible complications that may result in a catastrophic outcome. The patient indicated having understood very clearly. We have given the patient no guarantees and we have made no promises. Enough time was given to the patient to ask questions, all of which were answered to the patient's satisfaction. Ms. Hebenstreit has indicated that she wanted to continue with the procedure. Attestation: I, the ordering provider, attest that I have discussed with the patient the benefits, risks, side-effects, alternatives, likelihood of achieving goals, and potential problems during recovery for the procedure that I have provided informed consent. Date  Time: 07/26/2020  9:17 AM  Pre-Procedure Preparation:  Monitoring: As per clinic protocol. Respiration, ETCO2, SpO2, BP, heart rate and rhythm monitor placed and checked for adequate function Safety Precautions: Patient was assessed for positional comfort and pressure points before starting the procedure. Time-out: I initiated and conducted the "Time-out" before starting the procedure, as per protocol. The patient was asked to participate by confirming the accuracy of the "Time Out" information. Verification of the correct person, site, and procedure were performed and confirmed by me, the nursing staff, and the patient. "Time-out" conducted as per Joint Commission's Universal  Protocol (UP.01.01.01). Time: 0950  Description of Procedure #1:  Target Area: The inferior and lateral portion of the pedicle, just lateral to a line created by the 6:00 position of the pedicle and the superior articular process of the vertebral body below. On the lateral view, this target lies just posterior to the anterior aspect of the lamina and posterior to the midpoint created between the anterior and the posterior aspect of the neural foramina. Approach: Posterior paravertebral approach. Area Prepped: Entire Posterior Lumbosacral Region DuraPrep (  Iodine Povacrylex [0.7% available iodine] and Isopropyl Alcohol, 74% w/w) Safety Precautions: Aspiration looking for blood return was conducted prior to all injections. At no point did we inject any substances, as a needle was being advanced. No attempts were made at seeking any paresthesias. Safe injection practices and needle disposal techniques used. Medications properly checked for expiration dates. SDV (single dose vial) medications used.  Description of the Procedure: Protocol guidelines were followed. The patient was placed in position over the fluoroscopy table. The target area was identified and the area prepped in the usual manner. Skin & deeper tissues infiltrated with local anesthetic. Appropriate amount of time allowed to pass for local anesthetics to take effect. The procedure needles were then advanced to the target area. Proper needle placement secured. Negative aspiration confirmed. Solution injected in intermittent fashion, asking for systemic symptoms every 0.2cc of injectate. The needles were then removed and the area cleansed, making sure to leave some of the prepping solution back to take advantage of its long term bactericidal properties.  Start Time: 0950 hrs.  Materials:  Needle(s) Type: Spinal Needle Gauge: 22G Length: 3.5-in Medication(s): Please see orders for medications and dosing details.  Description of Procedure  #2:  Target Area: The  interlaminar space, initially targeting the lower border of the superior vertebral body lamina. Approach: Posterior paramedial approach. Area Prepped: Same as above Prepping solution: Same as above Safety Precautions: Same as above  Description of the Procedure: Protocol guidelines were followed. The patient was placed in position over the fluoroscopy table. The target area was identified and the area prepped in the usual manner. Skin & deeper tissues infiltrated with local anesthetic. Appropriate amount of time allowed to pass for local anesthetics to take effect. The procedure needle was introduced through the skin, ipsilateral to the reported pain, and advanced to the target area. Bone was contacted and the needle walked caudad, until the lamina was cleared. The ligamentum flavum was engaged and loss-of-resistance technique used as the epidural needle was advanced. The epidural space was identified using "loss-of-resistance technique" with 2-3 ml of PF-NaCl (0.9% NSS), in a 5cc LOR glass syringe. Proper needle placement secured. Negative aspiration confirmed. Solution injected in intermittent fashion, asking for systemic symptoms every 0.5cc of injectate. The needles were then removed and the area cleansed, making sure to leave some of the prepping solution back to take advantage of its long term bactericidal properties.  Vitals:   07/26/20 1000 07/26/20 1010 07/26/20 1021 07/26/20 1030  BP: (!) 142/94 117/69 129/68 124/81  Pulse:      Resp: 16 15 20 11   Temp:      TempSrc:      SpO2: 95% 99% 100% 100%  Weight:      Height:        End Time: 1000 hrs.  Materials:  Needle(s) Type: Epidural needle Gauge: 17G Length: 3.5-in Medication(s): Please see orders for medications and dosing details.  Imaging Guidance (Spinal):          Type of Imaging Technique: Fluoroscopy Guidance (Spinal) Indication(s): Assistance in needle guidance and placement for procedures  requiring needle placement in or near specific anatomical locations not easily accessible without such assistance. Exposure Time: Please see nurses notes. Contrast: Before injecting any contrast, we confirmed that the patient did not have an allergy to iodine, shellfish, or radiological contrast. Once satisfactory needle placement was completed at the desired level, radiological contrast was injected. Contrast injected under live fluoroscopy. No contrast complications. See chart for type and  volume of contrast used. Fluoroscopic Guidance: I was personally present during the use of fluoroscopy. "Tunnel Vision Technique" used to obtain the best possible view of the target area. Parallax error corrected before commencing the procedure. "Direction-depth-direction" technique used to introduce the needle under continuous pulsed fluoroscopy. Once target was reached, antero-posterior, oblique, and lateral fluoroscopic projection used confirm needle placement in all planes. Images permanently stored in EMR. Interpretation: I personally interpreted the imaging intraoperatively. Adequate needle placement confirmed in multiple planes. Appropriate spread of contrast into desired area was observed. No evidence of afferent or efferent intravascular uptake. No intrathecal or subarachnoid spread observed. Permanent images saved into the patient's record.  Antibiotic Prophylaxis:   Anti-infectives (From admission, onward)   None     Indication(s): None identified  Post-operative Assessment:  Post-procedure Vital Signs:  Pulse/HCG Rate: 7290 Temp: 98.1 F (36.7 C) Resp: 11 BP: 124/81 SpO2: 100 %  EBL: None  Complications: No immediate post-treatment complications observed by team, or reported by patient.  Note: The patient tolerated the entire procedure well. A repeat set of vitals were taken after the procedure and the patient was kept under observation following institutional policy, for this type of  procedure. Post-procedural neurological assessment was performed, showing return to baseline, prior to discharge. The patient was provided with post-procedure discharge instructions, including a section on how to identify potential problems. Should any problems arise concerning this procedure, the patient was given instructions to immediately contact us, at any time, without hesitation. In any case, we plan to contact the patient by telephone for a follow-up status report regarding this interventional procedure.  Comments:  No additional relevant information.  Plan of Care  Orders:  Orders Placed This Encounter  Procedures  . Lumbar Epidural Injection    Scheduling Instructions:     Procedure: Interlaminar LESI L2-3     Laterality: Right-sided     Sedation: Patient's choice     Timeframe:  Today    Order Specific Question:   Where will this procedure be performed?    Answer:   [facility]  . Lumbar Transforaminal Epidural    Scheduling Instructions:     Side: Left-sided     Level: L3     Sedation: Patient's choice.     Timeframe: Today    Order Specific Question:   Where will this procedure be performed?    Answer:   [facility]  . DG [facility] C-ARM 1-60 MIN NO REPORT    Intraoperative interpretation by procedural physician at [facility].    Standing Status:   Standing    Number of Occurrences:   1    Order Specific Question:   Reason for exam:    Answer:   Assistance in needle guidance and placement for procedures requiring needle placement in or near specific anatomical locations not easily accessible without such assistance.  . Informed Consent Details: Physician/Practitioner Attestation; Transcribe to consent form and obtain patient signature    Note: Always confirm laterality of pain with Ms. Arruda, before procedure. Transcribe to consent form and obtain patient signature.    Order Specific Question:   Physician/Practitioner attestation of  informed consent for procedure/surgical case    Answer:   I, the physician/practitioner, attest that I have discussed with the patient the benefits, risks, side effects, alternatives, likelihood of achieving goals and potential problems during recovery for the procedure that I have provided informed consent.    Order Specific Question:   Procedure  Answer:   Lumbar epidural steroid injection under fluoroscopic guidance    Order Specific Question:   Physician/Practitioner performing the procedure    Answer:   Abdulla Pooley A. Dossie Arbour, MD    Order Specific Question:   Indication/Reason    Answer:   Low back and/or lower extremity pain secondary to lumbar radiculitis  . Informed Consent Details: Physician/Practitioner Attestation; Transcribe to consent form and obtain patient signature    Provider Attestation: I, Union Dossie Arbour, MD, (Pain Management Specialist), the physician/practitioner, attest that I have discussed with the patient the benefits, risks, side effects, alternatives, likelihood of achieving goals and potential problems during recovery for the procedure that I have provided informed consent.    Scheduling Instructions:     Note: Always confirm laterality of pain with Ms. Delehanty, before procedure.     Transcribe to consent form and obtain patient signature.    Order Specific Question:   Physician/Practitioner attestation of informed consent for procedure/surgical case    Answer:   I, the physician/practitioner, attest that I have discussed with the patient the benefits, risks, side effects, alternatives, likelihood of achieving goals and potential problems during recovery for the procedure that I have provided informed consent.    Order Specific Question:   Procedure    Answer:   Diagnostic lumbar transforaminal epidural steroid injection under fluoroscopic guidance. (See notes for level and laterality.)    Order Specific Question:   Physician/Practitioner performing the procedure     Answer:   Viola Placeres A. 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 Provide equipment / supplies at bedside    "Epidural Tray" (Disposable  single use) Catheter: NOT required    Standing Status:   Standing    Number of Occurrences:   1    Order Specific Question:   Specify    Answer:   Epidural Tray   Chronic Opioid Analgesic:  No opioid analgesics from our practice.    Medications ordered for procedure: Meds ordered this encounter  Medications  . iohexol (OMNIPAQUE) 180 MG/ML injection 10 mL    Must be Myelogram-compatible. If not available, you may substitute with a water-soluble, non-ionic, hypoallergenic, myelogram-compatible radiological contrast medium.  Marland Kitchen lidocaine (XYLOCAINE) 2 % (with pres) injection 400 mg  . lactated ringers infusion 1,000 mL  . midazolam (VERSED) 5 MG/5ML injection 1-2 mg    Make sure Flumazenil is available in the pyxis when using this medication. If oversedation occurs, administer 0.2 mg IV over 15 sec. If after 45 sec no response, administer 0.2 mg again over 1 min; may repeat at 1 min intervals; not to exceed 4 doses (1 mg)  . fentaNYL (SUBLIMAZE) injection 25-50 mcg    Make sure Narcan is available in the pyxis when using this medication. In the event of respiratory depression (RR< 8/min): Titrate NARCAN (naloxone) in increments of 0.1 to 0.2 mg IV at 2-3 minute intervals, until desired degree of reversal.  . sodium chloride flush (NS) 0.9 % injection 2 mL  . ropivacaine (PF) 2 mg/mL (0.2%) (NAROPIN) injection 2 mL  . triamcinolone acetonide (KENALOG-40) injection 40 mg  . sodium chloride flush (NS) 0.9 % injection 1 mL  . ropivacaine (PF) 2 mg/mL (0.2%) (NAROPIN) injection 1 mL  . dexamethasone (DECADRON) injection 10 mg   Medications administered: We administered iohexol, lidocaine, lactated ringers, fentaNYL, sodium chloride flush, ropivacaine (PF) 2  mg/mL (0.2%), triamcinolone acetonide,  sodium chloride flush, ropivacaine (PF) 2 mg/mL (0.2%), and dexamethasone.  See the medical record for exact dosing, route, and time of administration.  Follow-up plan:   No follow-ups on file.       Interventional management options: Considering: Diagnosticright L3-4LESI Diagnostic bilateral L3 transforaminal LESI Diagnostic bilateral lumbar facet block#2 Possible bilateral lumbar facetRFA Diagnostic bilateralsuprascapularnerve block Possible bilateral suprascapularRFA Diagnostic bilateral T11 transforaminal TESI   Palliative PRN treatment(s): Palliative bilateral lumbar facet block #2 Diagnostic/therapeutic left L3 TFESI #4 + right L2-3 LESI #4 (100/100/100/>50)     Recent Visits Date Type Provider Dept  07/17/20 Office Visit Milinda Pointer, MD [facility]  Showing recent visits within past 90 days and meeting all other requirements Today's Visits Date Type Provider Dept  07/26/20 Procedure visit Milinda Pointer, MD [facility]  Showing today's visits and meeting all other requirements Future Appointments Date Type Provider Dept  08/08/20 Appointment Milinda Pointer, MD [facility]  Showing future appointments within next 90 days and meeting all other requirements  Disposition: Discharge home  Discharge (Date  Time): 07/26/2020; 1035 hrs.   Primary Care Physician: Remi Haggard, FNP Location: [facility] Note by: Gaspar Cola, MD Date: 07/26/2020; Time: 10:35 AM  Disclaimer:  Medicine is not an exact science. The only guarantee in medicine is that nothing is guaranteed. It is important to note that the decision to proceed with this intervention was based on the information collected from the patient. The Data and conclusions were drawn from the patient's questionnaire, the interview, and the physical examination. Because the information  was provided in large part by the patient, it cannot be guaranteed that it has not been purposely or unconsciously manipulated. Every effort has been made to obtain as much relevant data as possible for this evaluation. It is important to note that the conclusions that lead to this procedure are derived in large part from the available data. Always take into account that the treatment will also be dependent on availability of resources and existing treatment guidelines, considered by other Pain Management Practitioners as being common knowledge and practice, at the time of the intervention. For Medico-Legal purposes, it is also important to point out that variation in procedural techniques and pharmacological choices are the acceptable norm. The indications, contraindications, technique, and results of the above procedure should only be interpreted and judged by a Board-Certified Interventional Pain Specialist with extensive familiarity and expertise in the same exact procedure and technique.

## 2020-07-26 NOTE — Patient Instructions (Signed)

## 2020-07-27 ENCOUNTER — Telehealth: Payer: Self-pay

## 2020-07-27 NOTE — Telephone Encounter (Signed)
Post procedure follow up.  LM 

## 2020-08-02 ENCOUNTER — Other Ambulatory Visit: Payer: Self-pay

## 2020-08-02 DIAGNOSIS — D500 Iron deficiency anemia secondary to blood loss (chronic): Secondary | ICD-10-CM

## 2020-08-06 ENCOUNTER — Inpatient Hospital Stay: Payer: Medicare Other | Attending: Internal Medicine

## 2020-08-06 ENCOUNTER — Encounter: Payer: Self-pay | Admitting: Internal Medicine

## 2020-08-06 ENCOUNTER — Other Ambulatory Visit: Payer: Self-pay

## 2020-08-06 ENCOUNTER — Inpatient Hospital Stay (HOSPITAL_BASED_OUTPATIENT_CLINIC_OR_DEPARTMENT_OTHER): Payer: Medicare Other | Admitting: Internal Medicine

## 2020-08-06 DIAGNOSIS — D509 Iron deficiency anemia, unspecified: Secondary | ICD-10-CM

## 2020-08-06 DIAGNOSIS — E119 Type 2 diabetes mellitus without complications: Secondary | ICD-10-CM | POA: Insufficient documentation

## 2020-08-06 DIAGNOSIS — D500 Iron deficiency anemia secondary to blood loss (chronic): Secondary | ICD-10-CM

## 2020-08-06 DIAGNOSIS — F17210 Nicotine dependence, cigarettes, uncomplicated: Secondary | ICD-10-CM | POA: Diagnosis not present

## 2020-08-06 LAB — CBC WITH DIFFERENTIAL/PLATELET
Abs Immature Granulocytes: 0.16 10*3/uL — ABNORMAL HIGH (ref 0.00–0.07)
Basophils Absolute: 0.1 10*3/uL (ref 0.0–0.1)
Basophils Relative: 1 %
Eosinophils Absolute: 0.2 10*3/uL (ref 0.0–0.5)
Eosinophils Relative: 1 %
HCT: 39.9 % (ref 36.0–46.0)
Hemoglobin: 13.1 g/dL (ref 12.0–15.0)
Immature Granulocytes: 1 %
Lymphocytes Relative: 27 %
Lymphs Abs: 3.3 10*3/uL (ref 0.7–4.0)
MCH: 27.3 pg (ref 26.0–34.0)
MCHC: 32.8 g/dL (ref 30.0–36.0)
MCV: 83.3 fL (ref 80.0–100.0)
Monocytes Absolute: 0.8 10*3/uL (ref 0.1–1.0)
Monocytes Relative: 7 %
Neutro Abs: 7.5 10*3/uL (ref 1.7–7.7)
Neutrophils Relative %: 63 %
Platelets: 351 10*3/uL (ref 150–400)
RBC: 4.79 MIL/uL (ref 3.87–5.11)
RDW: 15.1 % (ref 11.5–15.5)
WBC: 12 10*3/uL — ABNORMAL HIGH (ref 4.0–10.5)
nRBC: 0 % (ref 0.0–0.2)

## 2020-08-06 LAB — COMPREHENSIVE METABOLIC PANEL
ALT: 16 U/L (ref 0–44)
AST: 16 U/L (ref 15–41)
Albumin: 3.9 g/dL (ref 3.5–5.0)
Alkaline Phosphatase: 74 U/L (ref 38–126)
Anion gap: 9 (ref 5–15)
BUN: 17 mg/dL (ref 8–23)
CO2: 27 mmol/L (ref 22–32)
Calcium: 10.1 mg/dL (ref 8.9–10.3)
Chloride: 99 mmol/L (ref 98–111)
Creatinine, Ser: 0.84 mg/dL (ref 0.44–1.00)
GFR, Estimated: >60 mL/min (ref >60)
Glucose, Bld: 108 mg/dL — ABNORMAL HIGH (ref 70–99)
Potassium: 3.9 mmol/L (ref 3.5–5.1)
Sodium: 135 mmol/L (ref 135–145)
Total Bilirubin: 0.8 mg/dL (ref 0.3–1.2)
Total Protein: 8.3 g/dL — ABNORMAL HIGH (ref 6.5–8.1)

## 2020-08-06 LAB — IRON AND TIBC
Iron: 39 ug/dL (ref 28–170)
Saturation Ratios: 9 % — ABNORMAL LOW (ref 10.4–31.8)
TIBC: 451 ug/dL — ABNORMAL HIGH (ref 250–450)
UIBC: 412 ug/dL

## 2020-08-06 LAB — LACTATE DEHYDROGENASE: LDH: 99 U/L (ref 98–192)

## 2020-08-06 LAB — FERRITIN: Ferritin: 38 ng/mL (ref 11–307)

## 2020-08-06 LAB — VITAMIN B12: Vitamin B-12: 452 pg/mL (ref 180–914)

## 2020-08-06 NOTE — Progress Notes (Signed)
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, ENDOSCOPIC RESECTION:  - WELL-DIFFERENTIATED NEUROENDOCRINE TUMOR.; Tumor size: 0.6 cm  Tumor focality: single focus; Histologic type: well differentiated neuroendocrine tumor  Histologic grade: grade 1; Mitotic rate: < 2 mitosis in 10 hpf and < 3% KI-67 index   #Chronic mild anemia microcytic-baseline-11-12; June September 2020-9; microcytic; [multiple prior EGD/colonoscopies; [facility]]; no bone marrow biopsy; October 2020 EGD colonoscopy-no acute bleeding etiology [Dr.Toledo]  # DM/active smoker/remote history of TIA   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  female is here for follow-up of anemia.  Patient was evaluated in October 2020-for severe microcytic anemia iron deficiency hemoglobin 8.3.  Patient received EGD colonoscopy-no obvious evidence of bleeding.  Otherwise patient denies any blood in stools or black-colored stool.  Review of Systems  Constitutional: Positive for malaise/fatigue. Negative for chills, diaphoresis, fever and weight loss.  HENT: Negative for nosebleeds and sore throat.   Eyes: Negative for double vision.  Respiratory: Negative for cough, hemoptysis, sputum production, shortness of breath and wheezing.   Cardiovascular: Negative for chest pain, palpitations, orthopnea and leg swelling.  Gastrointestinal: Negative for abdominal pain, blood in stool, constipation, diarrhea, heartburn, melena, nausea and vomiting.  Genitourinary: Negative for dysuria, frequency and urgency.  Musculoskeletal: Positive for back pain and joint pain.  Skin: Negative.  Negative for itching and rash.   Neurological: Negative for dizziness, tingling, focal weakness, weakness and headaches.  Endo/Heme/Allergies: Does not bruise/bleed easily.  Psychiatric/Behavioral: Negative for depression. The patient is not nervous/anxious and does not have insomnia.     MEDICAL HISTORY:  Past Medical History:  Diagnosis Date  . AF (atrial fibrillation) ([place])   . AKI (acute kidney injury) ([place]) 07/21/2019  . Arthritis   . Asthma   . Chicken pox   . Clotting disorder ([place])   . Depression   . Diabetes mellitus without complication ([place])   . Diverticulosis   . Dysrhythmia   . GERD (gastroesophageal reflux disease)   . Glaucoma   . Hemorrhoids   . Hemorrhoids, internal   . HH (hiatus hernia)   . History of hiatal hernia   . History of kidney stones   . Hypertension   . Migraines   . Neuropathy   . Obesity   . Opiate use 03/16/2017  . Personal history of tobacco use, presenting hazards to health 06/18/2015  . Sleep apnea   . Tubular adenoma of colon     SURGICAL HISTORY: Past Surgical History:  Procedure Laterality Date  . ABDOMINAL HYSTERECTOMY    . BACK SURGERY    . CARPAL TUNNEL RELEASE    . carpal tunnell    . CATARACT EXTRACTION    . CHOLECYSTECTOMY    . COLONOSCOPY WITH ESOPHAGOGASTRODUODENOSCOPY (EGD)    . COLONOSCOPY WITH PROPOFOL N/A 08/03/2019   Procedure: COLONOSCOPY WITH PROPOFOL;  Surgeon: Toledo, Benay Pike, MD;  Location: [facility] ENDOSCOPY;  Service: Gastroenterology;  Laterality: N/A;  . ESOPHAGOGASTRODUODENOSCOPY (EGD) WITH PROPOFOL N/A 02/16/2017   Procedure: ESOPHAGOGASTRODUODENOSCOPY (EGD) WITH PROPOFOL;  Surgeon: Manya Silvas, MD;  Location: [facility] ENDOSCOPY;  Service: Endoscopy;  Laterality: N/A;  . ESOPHAGOGASTRODUODENOSCOPY (EGD) WITH PROPOFOL N/A 08/03/2019   Procedure: ESOPHAGOGASTRODUODENOSCOPY (  EGD) WITH PROPOFOL;  Surgeon: Toledo, Benay Pike, MD;  Location: [facility] ENDOSCOPY;  Service: Gastroenterology;  Laterality: N/A;  . EYE SURGERY    . JOINT REPLACEMENT  Bilateral 1999   TOTAL KNEE  . LUMBAR FUSION    . REPAIR ROTATOR CUFF TEAR    . TOTAL KNEE ARTHROPLASTY    . UPPER ESOPHAGEAL ENDOSCOPIC ULTRASOUND (EUS) N/A 01/03/2016   Procedure: UPPER ESOPHAGEAL ENDOSCOPIC ULTRASOUND (EUS);  Surgeon: Holly Bodily, MD;  Location: [facility] ENDOSCOPY;  Service: Gastroenterology;  Laterality: N/A;    SOCIAL HISTORY: Social History   Socioeconomic History  . Marital status: Single    Spouse name: Not on file  . Number of children: Not on file  . Years of education: Not on file  . Highest education level: Not on file  Occupational History  . Not on file  Tobacco Use  . Smoking status: Current Every Day Smoker    Packs/day: 1.00    Types: Cigarettes  . Smokeless tobacco: Never Used  Vaping Use  . Vaping Use: Never used  Substance and Sexual Activity  . Alcohol use: No  . Drug use: No  . Sexual activity: Not on file  Other Topics Concern  . Not on file  Social History Narrative   Lives in [place], lives alone. Rolling walker sec to back/hip pain. 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 Smoke ~1ppd/>50; No alcohol.    Social Determinants of Health   Financial Resource Strain:   . Difficulty of Paying Living Expenses: Not on file  Food Insecurity:   . Worried About Charity fundraiser in the Last Year: Not on file  . Ran Out of Food in the Last Year: Not on file  Transportation Needs:   . Lack of Transportation (Medical): Not on file  . Lack of Transportation (Non-Medical): Not on file  Physical Activity:   . Days of Exercise per Week: Not on file  . Minutes of Exercise per Session: Not on file  Stress:   . Feeling of Stress : Not on file  Social Connections:   . Frequency of Communication with Friends and Family: Not on file  . Frequency of Social Gatherings with Friends and Family: Not on file  . Attends Religious Services: Not on file  . Active Member of Clubs or Organizations: Not on file  . Attends Archivist Meetings: Not on file   . Marital Status: Not on file  Intimate Partner Violence:   . Fear of Current or Ex-Partner: Not on file  . Emotionally Abused: Not on file  . Physically Abused: Not on file  . Sexually Abused: Not on file    FAMILY HISTORY: Family History  Problem Relation Age of Onset  . Diabetes Mother   . Diabetes Sister     ALLERGIES:  has No Known Allergies.  MEDICATIONS:  Current Outpatient Medications  Medication Sig Dispense Refill  . amLODipine (NORVASC) 10 MG tablet Take 10 mg by mouth daily.    . Ascorbic Acid (VITAMIN C) 1000 MG tablet Take 1,000 mg by mouth daily.    Marland Kitchen aspirin 81 MG tablet Take 81 mg by mouth daily.    Marland Kitchen atorvastatin (LIPITOR) 40 MG tablet Take 1 tablet (40 mg total) by mouth daily. 30 tablet 0  . Cholecalciferol (D-5000) 5000 units TABS Take 5,000 Units by mouth daily.     . COMBIVENT RESPIMAT 20-100 MCG/ACT AERS respimat USE 1 VIAL VIA NEBULIZER 4 TIMES A DAY AS NEEDED    .  ferrous sulfate 325 (65 FE) MG EC tablet Take 325 mg by mouth 2 (two) times daily.     . folic acid (FOLVITE) 1 MG tablet Take 1 mg by mouth daily.    . hydrOXYzine (VISTARIL) 25 MG capsule Take 25 mg by mouth every 12 (twelve) hours as needed.    . insulin lispro (HUMALOG KWIKPEN) 100 UNIT/ML KwikPen Inject 3 Units into the skin 3 (three) times daily.     Marland Kitchen JARDIANCE 25 MG TABS tablet Take 25 mg by mouth daily.     Marland Kitchen latanoprost (XALATAN) 0.005 % ophthalmic solution Place 1 drop into both eyes at bedtime.     Marland Kitchen LEVEMIR FLEXTOUCH 100 UNIT/ML Pen Inject 25 Units into the skin daily. Patient reports she only takes Levemir if her glucose is >150 mg/dl which is rare for her  5  . metFORMIN (GLUCOPHAGE) 500 MG tablet Take 500 mg by mouth 2 (two) times daily with a meal.    . olmesartan-hydrochlorothiazide (BENICAR HCT) 40-25 MG tablet Take 1 tablet by mouth daily.    . Omega-3 Fatty Acids (FISH OIL) 1000 MG CAPS Take by mouth daily.    . pantoprazole (PROTONIX) 40 MG tablet Take 1 tablet by mouth 2  (two) times daily.     . pregabalin (LYRICA) 150 MG capsule Take 150 mg by mouth 2 (two) times daily.     Marland Kitchen rOPINIRole (REQUIP) 1 MG tablet TAKE 1 TABLET BY MOUTH EVERYDAY AT BEDTIME    . traZODone (DESYREL) 50 MG tablet     . vitamin B-12 1000 MCG tablet Take 1 tablet (1,000 mcg total) by mouth daily. 30 tablet 1   No current facility-administered medications for this visit.      PHYSICAL EXAMINATION:   Vitals:   08/06/20 1307  BP: 126/85  Pulse: 81  Temp: 99.1 F (37.3 C)  SpO2: 100%   Filed Weights   08/06/20 1307  Weight: 191 lb (86.6 kg)    Physical Exam Constitutional:      Comments: Obese.  Walking with a rolling walker.  She is alone.  HENT:     Head: Normocephalic and atraumatic.     Mouth/Throat:     Pharynx: No oropharyngeal exudate.  Eyes:     Pupils: Pupils are equal, round, and reactive to light.  Cardiovascular:     Rate and Rhythm: Normal rate and regular rhythm.  Pulmonary:     Effort: No respiratory distress.     Breath sounds: No wheezing.  Abdominal:     General: Bowel sounds are normal. There is no distension.     Palpations: Abdomen is soft. There is no mass.     Tenderness: There is no abdominal tenderness. There is no guarding or rebound.  Musculoskeletal:        General: No tenderness. Normal range of motion.     Cervical back: Normal range of motion and neck supple.  Skin:    General: Skin is warm.  Neurological:     Mental Status: She is alert and oriented to person, place, and time.  Psychiatric:        Mood and Affect: Affect normal.     LABORATORY DATA:  I have reviewed the data as listed Lab Results  Component Value Date   WBC 12.0 (H) 08/06/2020   HGB 13.1 08/06/2020   HCT 39.9 08/06/2020   MCV 83.3 08/06/2020   PLT 351 08/06/2020   Recent Labs    02/14/20 1442 08/06/20 1256  NA  --  135  K  --  3.9  CL  --  99  CO2  --  27  GLUCOSE  --  108*  BUN  --  17  CREATININE 0.60 0.84  CALCIUM  --  10.1  GFRNONAA   --  >60  PROT  --  8.3*  ALBUMIN  --  3.9  AST  --  16  ALT  --  16  ALKPHOS  --  74  BILITOT  --  0.8     DG Lumbar Spine Complete W/Bend  Result Date: 07/19/2020 CLINICAL DATA:  Low back pain with bilateral radiculopathy. Left worse than right. Previous lumbar surgery x2 EXAM: LUMBAR SPINE - COMPLETE WITH BENDING VIEWS COMPARISON:  CT lumbar spine 10/11/2018, MRI lumbar spine 07/22/2019 FINDINGS: Five rib-bearing lumbar vertebral bodies are identified. Posterolateral fusion at the L4 through S1 levels with inter pedicular screws, parallel rods, interbody cages. No radiographic finding to suggest loosening or fracture of the surgical hardware. Interval worsening of grade 1 anterolisthesis of L3 on L4 with now 7 mm anterolisthesis (from 3 mm). There is no change in alignment on flexion and extension views. Suggestion of persistent grade 1 anterolisthesis of L4 on L5. Multilevel degenerative changes of the spine are worse at the T12-L1 and L3-L4 levels. Interval increase in intervertebral disc space narrowing. No acute displaced fracture identified. Tacks overlying the pelvis suggestive of a hernia repair. Surgical clips overlying the pelvis. Aortic arch calcifications. IMPRESSION: 1. Interval worsening of grade 1 anterolisthesis of L3 on L4 with no change on flexion/extension views. Patient with L4 through S1 posterolateral fusion. 2. Interval increase intervertebral disc space narrowing at the L3-L4 level. These results will be called to the ordering clinician or representative by the Radiologist Assistant, and communication documented in the PACS or [employer]. Electronically Signed   By: Iven Finn M.D.   On: 07/19/2020 03:25   DG PAIN CLINIC C-ARM 1-60 MIN NO REPORT  Result Date: 07/26/2020 Fluoro was used, but no Radiologist interpretation will be provided. Please refer to "NOTES" tab for provider progress note.   Microcytic anemia #Iron deficiency anemia-currently on p.o. iron.   Hemoglobin today is 13.  Saturation 9%.  Ferritin 30.  Continue p.o. iron.  Hold off any iron infusion.  #Etiology of iron deficiency-unclear.  October 2020-EGD colonoscopy no obvious etiology of bleeding noted.  If continues to have repeated anemia would recommend capsule study.  # NET remote history-stage I.  Low risk of recurrence.  S/p bowel re-section; clinically no dense of recurrence.  Stable.  # Smoking: counseled  quitting smoking; FEb 2021- lung cancer screeing-NEGATIVE; continue surveillance imaging.    # DISPOSITION: will call re: venofer.  # follow up TBD- Dr.B   All questions were answered. The patient knows to call the clinic with any problems, questions or concerns.    Cammie Sickle, MD 08/06/2020 4:22 PM

## 2020-08-06 NOTE — Assessment & Plan Note (Addendum)
#  Iron deficiency anemia-currently on p.o. iron.  Hemoglobin today is 13.  Saturation 9%.  Ferritin 30.  Continue p.o. iron.  Hold off any iron infusion.  #Etiology of iron deficiency-unclear.  October 2020-EGD colonoscopy no obvious etiology of bleeding noted.  If continues to have repeated anemia would recommend capsule study.  # NET remote history-stage I.  Low risk of recurrence.  S/p bowel re-section; clinically no dense of recurrence.  Stable.  # Smoking: counseled  quitting smoking; FEb 2021- lung cancer screeing-NEGATIVE; continue surveillance imaging.    # DISPOSITION: will call re: venofer.  # follow up TBD- Dr.B

## 2020-08-07 ENCOUNTER — Other Ambulatory Visit: Payer: Self-pay | Admitting: Internal Medicine

## 2020-08-07 ENCOUNTER — Telehealth: Payer: Self-pay | Admitting: Internal Medicine

## 2020-08-07 DIAGNOSIS — D500 Iron deficiency anemia secondary to blood loss (chronic): Secondary | ICD-10-CM

## 2020-08-07 NOTE — Progress Notes (Signed)
PROVIDER NOTE: Information contained herein reflects review and annotations entered in association with encounter. Interpretation of such information and data should be left to medically-trained personnel. Information provided to patient can be located elsewhere in the medical record under "Patient Instructions". Document created using STT-dictation technology, any transcriptional errors that may result from process are unintentional.    Patient: Lynn Potter  Service Category: E/M  Provider: Gaspar Cola, MD  DOB: 13-Dec-1945  DOS: 08/08/2020  Specialty: Interventional Pain Management  MRN: [id number]  Setting: Ambulatory outpatient  PCP: Remi Haggard, FNP  Type: Established Patient    Referring Provider: Remi Haggard, FNP  Location: Office  Delivery: Face-to-face     HPI  [name], a 74 y.o. year old female, is here today because of her Chronic pain syndrome [G89.4]. Ms. Rothschild primary complain today is Back Pain Last encounter: My last encounter with her was on 07/26/2020. Pertinent problems: Ms. Billiot has Osteoarthritis of shoulder (Bilateral); Osteoarthritis of shoulder (Right); Rotator cuff tendinitis; History of rotator cuff surgery (Bilateral); Chronic pain syndrome; Degenerative joint disease (DJD) of hip; Sacroiliac joint pain; Chronic low back pain (1ry area of Pain) (Bilateral) (L>R) w/ sciatica (Left); Chronic lower extremity pain (2ry area of Pain) (Left); Chronic shoulder pain (3ry area of Pain) (Bilateral) (R>L); Chronic hip pain (Left); Osteoarthritis of hip (Left); Lumbar facet syndrome (Bilateral) (L>R); Spondylosis without myelopathy or radiculopathy, lumbosacral region; Chronic low back pain (1ry area of Pain) (Bilateral) (L>R) w/o sciatica; Abnormal CT of  lumbar spine (10/11/2018); Lumbar facet hypertrophy (Multilevel); DDD (degenerative disc disease), lumbar; Failed back surgical syndrome; 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 Pain Assessment: Severity of Chronic pain is reported as a 4 /10. Location: Back (left hip) Lower, Left/pain radiaties down left hip and leg. Onset: More than a month ago. Quality: Nagging, Aching. Timing: Constant. Modifying factor(s): meds. Vitals:  height is _0  (1.651 m) and weight is 189 lb (85.7 kg). Her temperature is 97.5 F (36.4 C) (abnormal). Her blood pressure is 112/69 and her pulse is 86. Her oxygen saturation is 100%.   Reason for encounter: post-procedure assessment.  The patient has a prior posterior lumbar decompression from L4 down to S1.  However a recent MRI shows severe multifactorial spinal stenosis at the L3-4 level.  The patient indicates that her last back surgery was done by Dr. [facility]auri Polewhile [facility]  She indicates having done great after this injection but she feels that she may need second 1 done before she goes on vacation for Thanksgiving.  Today I also reviewed the x-rays of the lumbar spine done on 07/17/2020 which show interval worsening of the patient's lumbar spondylosis. The findings were as follows: Five rib-bearing lumbar vertebral bodies are identified. Posterolateral fusion at the L4 through S1 levels with inter pedicular screws, parallel rods, interbody cages. No radiographic finding to suggest loosening or fracture of the surgical hardware. Interval worsening of grade 1 anterolisthesis of L3 on L4 with now 7 mm anterolisthesis (from 3 mm). There is no change in alignment on flexion and extension views. Suggestion of persistent grade 1 anterolisthesis of L4 on L5. Multilevel degenerative changes of the spine are worse at the T12-L1 and L3-L4 levels. Interval increase in intervertebral  disc space narrowing. No acute displaced fracture identified.  IMPRESSION: 1. Interval worsening of grade 1 anterolisthesis of L3 on L4 with no change on flexion/extension views. Patient with L4 through S1 posterolateral fusion. 2. Interval increase intervertebral disc space narrowing at the L3-L4 level.  Post-Procedure Evaluation  Procedure (07/26/2020): Palliative left L3 transforaminal ESI #1/S2 + palliative right L2-3 LESI #1/S2 under fluoroscopic guidance and IV sedation Pre-procedure pain level: 10/10 Post-procedure: 4/10 (> 50% relief)  Sedation: Sedation provided.  Effectiveness during initial hour after procedure(Ultra-Short Term Relief): 100 %.  Local anesthetic used: Long-acting (4-6 hours) Effectiveness: Defined as any analgesic benefit obtained secondary to the administration of local anesthetics. This carries significant diagnostic value as to the etiological location, or anatomical origin, of the pain. Duration of benefit is expected to coincide with the duration of the local anesthetic used.  Effectiveness during initial 4-6 hours after procedure(Short-Term Relief): 100 %.  Long-term benefit: Defined as any relief past the pharmacologic duration of the local anesthetics.  Effectiveness past the initial 6 hours after procedure(Long-Term Relief): 100 % (ongoing).  Current benefits: Defined as benefit that persist at this time.   Analgesia:  100% better Function: Ms. Dockendorf reports improvement in function ROM: Ms. Risdon reports improvement in ROM  Pharmacotherapy Assessment   Analgesic: No opioid analgesics from our practice.    Monitoring: [place] PMP: PDMP reviewed during this encounter.       Pharmacotherapy: No side-effects or adverse reactions reported. Compliance: No problems identified. Effectiveness: Clinically acceptable.  Chauncey Fischer, RN  08/08/2020  9:46 AM  Sign when Signing Visit Safety precautions to be maintained throughout the outpatient stay will  include: orient to surroundings, keep bed in low position, maintain call bell within reach at all times, provide assistance with transfer out of bed and ambulation.     UDS:  Summary  Date Value Ref Range Status  11/09/2018 FINAL  Final    Comment:    ==================================================================== [facility] COMP DRUG ANALYSIS,UR ==================================================================== Test                             Result       Flag       Units Drug Present and Declared for Prescription Verification   Pregabalin                     PRESENT      EXPECTED   Baclofen                       PRESENT      EXPECTED   Methocarbamol                  PRESENT      EXPECTED   Hydroxyzine                    PRESENT      EXPECTED Drug Absent but Declared for Prescription Verification   Salicylate                     Not Detected UNEXPECTED    Aspirin, as indicated in the declared medication list, is not    always detected even when used as directed. ==================================================================== Test                      Result    Flag   Units  Ref Range   Creatinine              58               mg/dL      >=20 ==================================================================== Declared Medications:  The flagging and interpretation on this report are based on the  following declared medications.  Unexpected results may arise from  inaccuracies in the declared medications.  **Note: The testing scope of this panel includes these medications:  Baclofen  Hydroxyzine  Methocarbamol  Pregabalin (Lyrica)  **Note: The testing scope of this panel does not include small to  moderate amounts of these reported medications:  Aspirin (Aspirin 81)  **Note: The testing scope of this panel does not include following  reported medications:  Amlodipine  Ascorbic Acid  Atorvastatin  Azelastine  Benazepril  Cholecalciferol  Empagliflozin  (Jardiance)  Fluconazole (Diflucan)  Insulin  Iron (Ferrous Sulfate)  Latanoprost  Metformin  Olmesartan (Benicar)  Pantoprazole  Ropinirole (Requip) ==================================================================== For clinical consultation, please call [phone]. ====================================================================      ROS  Constitutional: Denies any fever or chills Gastrointestinal: No reported hemesis, hematochezia, vomiting, or acute GI distress Musculoskeletal: Denies any acute onset joint swelling, redness, loss of ROM, or weakness Neurological: No reported episodes of acute onset apraxia, aphasia, dysarthria, agnosia, amnesia, paralysis, loss of coordination, or loss of consciousness  Medication Review  Cholecalciferol, Fish Oil, Ipratropium-Albuterol, amLODipine, aspirin, atorvastatin, cyanocobalamin, empagliflozin, ferrous sulfate, folic acid, hydrOXYzine, insulin detemir, insulin lispro, latanoprost, metFORMIN, olmesartan-hydrochlorothiazide, pantoprazole, pregabalin, rOPINIRole, traZODone, and vitamin C  History Review  Allergy: Ms. Sine has No Known Allergies. Drug: Ms. Deprey  reports no history of drug use. Alcohol:  reports no history of alcohol use. Tobacco:  reports that she has been smoking cigarettes. She has been smoking about 1.00 pack per day. She has never used smokeless tobacco. Social: Ms. Biancardi  reports that she has been smoking cigarettes. She has been smoking about 1.00 pack per day. She has never used smokeless tobacco. She reports that she does not drink alcohol and does not use drugs. Medical:  has a past medical history of AF (atrial fibrillation) ([place]), AKI (acute kidney injury) ([place]) (07/21/2019), Arthritis, Asthma, Chicken pox, Clotting disorder ([place]), Depression, Diabetes mellitus without complication ([place]), Diverticulosis, Dysrhythmia, GERD (gastroesophageal reflux disease), Glaucoma, Hemorrhoids, Hemorrhoids, internal,  HH (hiatus hernia), History of hiatal hernia, History of kidney stones, Hypertension, Migraines, Neuropathy, Obesity, Opiate use (03/16/2017), Personal history of tobacco use, presenting hazards to health (06/18/2015), Sleep apnea, and Tubular adenoma of colon. Surgical: Ms. Cortese  has a past surgical history that includes Total knee arthroplasty; Abdominal hysterectomy; Cholecystectomy; Colonoscopy with esophagogastroduodenoscopy (egd); carpal tunnell; Eye surgery; Cataract extraction; Back surgery; Lumbar fusion; Upper esophageal endoscopic ultrasound (eus) (N/A, 01/03/2016); Esophagogastroduodenoscopy (egd) with propofol (N/A, 02/16/2017); Carpal tunnel release; REPAIR ROTATOR CUFF TEAR; Joint replacement (Bilateral, 1999); Esophagogastroduodenoscopy (egd) with propofol (N/A, 08/03/2019); and Colonoscopy with propofol (N/A, 08/03/2019). Family: family history includes Diabetes in her mother and sister.  Laboratory Chemistry Profile   Renal Lab Results  Component Value Date   BUN 17 08/06/2020   CREATININE 0.84 08/06/2020   BCR 10 (L) 11/09/2018   GFRAA >60 07/23/2019   GFRNONAA >60 08/06/2020     Hepatic Lab Results  Component Value Date   AST 16 08/06/2020   ALT 16 08/06/2020   ALBUMIN 3.9 08/06/2020   ALKPHOS 74 08/06/2020   LIPASE 21 05/23/2018     Electrolytes Lab Results  Component Value Date   NA  135 08/06/2020   K 3.9 08/06/2020   CL 99 08/06/2020   CALCIUM 10.1 08/06/2020   MG 2.1 07/22/2019     Bone Lab Results  Component Value Date   25OHVITD1 33 11/29/2018   25OHVITD2 2.5 11/29/2018   25OHVITD3 30 11/29/2018     Inflammation (CRP: Acute Phase) (ESR: Chronic Phase) Lab Results  Component Value Date   CRP 16 (H) 11/09/2018   ESRSEDRATE 77 (H) 11/09/2018   LATICACIDVEN 1.9 07/21/2019       Note: Above Lab results reviewed.  Recent Imaging Review  DG PAIN CLINIC C-ARM 1-60 MIN NO REPORT Fluoro was used, but no Radiologist interpretation will be provided.   Please refer to "NOTES" tab for provider progress note. Note: Reviewed        Physical Exam  General appearance: Well nourished, well developed, and well hydrated. In no apparent acute distress Mental status: Alert, oriented x 3 (person, place, & time)       Respiratory: No evidence of acute respiratory distress Eyes: PERLA Vitals: BP 112/69   Pulse 86   Temp (!) 97.5 F (36.4 C)   Ht _0  (1.651 m)   Wt 189 lb (85.7 kg)   SpO2 100%   BMI 31.45 kg/m  BMI: Estimated body mass index is 31.45 kg/m as calculated from the following:   Height as of this encounter: _1  (1.651 m).   Weight as of this encounter: 189 lb (85.7 kg). Ideal: Ideal body weight: 57 kg (125 lb 10.6 oz) Adjusted ideal body weight: 68.5 kg (151 lb)  Assessment   Status Diagnosis  Controlled Controlled Controlled 1. Chronic pain syndrome   2. Chronic low back pain (1ry area of Pain) (Bilateral) (L>R) w/ sciatica (Left)   3. Chronic lower extremity pain (2ry area of Pain) (Left)   4. Failed back surgical syndrome   5. Grade 1 Anterolisthesis of lumbar spine (55m) (L3-4)      Updated Problems: No problems updated.  Plan of Care  Problem-specific:  No problem-specific Assessment & Plan notes found for this encounter.  Ms. RDAVON FOLTAhas a current medication list which includes the following long-term medication(s): ferrous sulfate, insulin lispro, levemir flextouch, pregabalin, trazodone, and atorvastatin.  Pharmacotherapy (Medications Ordered): No orders of the defined types were placed in this encounter.  Orders:  No orders of the defined types were placed in this encounter.  Follow-up plan:   Return for Procedure (w/ sedation): (L) L3 TFESI #2 + (R) L2-3 LESI #2.      Interventional management options: Considering: Diagnosticright L3-4LESI Diagnostic bilateral L3 transforaminal LESI Diagnostic bilateral lumbar facet block#2 Possible bilateral lumbar facetRFA Diagnostic  bilateralsuprascapularnerve block Possible bilateral suprascapularRFA Diagnostic bilateral T11 transforaminal TESI   Palliative PRN treatment(s): Palliative bilateral lumbar facet block #2 Diagnostic/therapeutic left L3 TFESI #2/S2 + right L2-3 LESI #2/S2 (100/100/100/100)     Recent Visits Date Type Provider Dept  07/26/20 Procedure visit NMilinda Pointer MD [facility]  07/17/20 Office Visit NMilinda Pointer MD [facility]  Showing recent visits within past 90 days and meeting all other requirements Today's Visits Date Type Provider Dept  08/08/20 Office Visit NMilinda Pointer MD [facility]  Showing today's visits and meeting all other requirements Future Appointments No visits were found meeting these conditions. Showing future appointments within next 90 days and meeting all other requirements  I discussed the assessment and treatment plan with the patient. The patient was provided an opportunity to ask questions and  all were answered. The patient agreed with the plan and demonstrated an understanding of the instructions.  Patient advised to call back or seek an in-person evaluation if the symptoms or condition worsens.  Duration of encounter: 30 minutes.  Note by: Gaspar Cola, MD Date: 08/08/2020; Time: 10:09 AM

## 2020-08-07 NOTE — Telephone Encounter (Signed)
On 11/01-called patient to update the results of her blood work.  Hemoglobin normal at 13; no obvious iron deficiency noted. NO need for iron infusion. Recommend continued p.o. iron.  C-schedule follow-up in 6 months; labs CBC;bmp; iron studies ferritin; possible Venofer.   Thanks GB

## 2020-08-07 NOTE — Addendum Note (Signed)
Addended by: Gloris Ham on: 08/07/2020 09:04 AM   Modules accepted: Orders

## 2020-08-07 NOTE — Progress Notes (Signed)
x

## 2020-08-08 ENCOUNTER — Ambulatory Visit: Payer: Medicare Other | Attending: Pain Medicine | Admitting: Pain Medicine

## 2020-08-08 ENCOUNTER — Other Ambulatory Visit: Payer: Self-pay

## 2020-08-08 ENCOUNTER — Encounter: Payer: Self-pay | Admitting: Pain Medicine

## 2020-08-08 VITALS — BP 112/69 | HR 86 | Temp 97.5°F | Ht 65.0 in | Wt 189.0 lb

## 2020-08-08 DIAGNOSIS — G894 Chronic pain syndrome: Secondary | ICD-10-CM | POA: Diagnosis not present

## 2020-08-08 DIAGNOSIS — M79605 Pain in left leg: Secondary | ICD-10-CM | POA: Insufficient documentation

## 2020-08-08 DIAGNOSIS — M5442 Lumbago with sciatica, left side: Secondary | ICD-10-CM | POA: Diagnosis present

## 2020-08-08 DIAGNOSIS — M4316 Spondylolisthesis, lumbar region: Secondary | ICD-10-CM | POA: Insufficient documentation

## 2020-08-08 DIAGNOSIS — M961 Postlaminectomy syndrome, not elsewhere classified: Secondary | ICD-10-CM | POA: Diagnosis not present

## 2020-08-08 DIAGNOSIS — G8929 Other chronic pain: Secondary | ICD-10-CM | POA: Insufficient documentation

## 2020-08-08 NOTE — Patient Instructions (Signed)
____________________________________________________________________________________________  Preparing for Procedure with Sedation  Procedure appointments are limited to planned procedures: . No Prescription Refills. . No disability issues will be discussed. . No medication changes will be discussed.  Instructions: . Oral Intake: Do not eat or drink anything for at least 8 hours prior to your procedure. (Exception: Blood Pressure Medication. See below.) . Transportation: Unless otherwise stated by your physician, you may drive yourself after the procedure. . Blood Pressure Medicine: Do not forget to take your blood pressure medicine with a sip of water the morning of the procedure. If your Diastolic (lower reading)is above 100 mmHg, elective cases will be cancelled/rescheduled. . Blood thinners: These will need to be stopped for procedures. Notify our staff if you are taking any blood thinners. Depending on which one you take, there will be specific instructions on how and when to stop it. . Diabetics on insulin: Notify the staff so that you can be scheduled 1st case in the morning. If your diabetes requires high dose insulin, take only  of your normal insulin dose the morning of the procedure and notify the staff that you have done so. . Preventing infections: Shower with an antibacterial soap the morning of your procedure. . Build-up your immune system: Take 1000 mg of Vitamin C with every meal (3 times a day) the day prior to your procedure. . Antibiotics: Inform the staff if you have a condition or reason that requires you to take antibiotics before dental procedures. . Pregnancy: If you are pregnant, call and cancel the procedure. . Sickness: If you have a cold, fever, or any active infections, call and cancel the procedure. . Arrival: You must be in the facility at least 30 minutes prior to your scheduled procedure. . Children: Do not bring children with you. . Dress appropriately:  Bring dark clothing that you would not mind if they get stained. . Valuables: Do not bring any jewelry or valuables.  Reasons to call and reschedule or cancel your procedure: (Following these recommendations will minimize the risk of a serious complication.) . Surgeries: Avoid having procedures within 2 weeks of any surgery. (Avoid for 2 weeks before or after any surgery). . Flu Shots: Avoid having procedures within 2 weeks of a flu shots or . (Avoid for 2 weeks before or after immunizations). . Barium: Avoid having a procedure within 7-10 days after having had a radiological study involving the use of radiological contrast. (Myelograms, Barium swallow or enema study). . Heart attacks: Avoid any elective procedures or surgeries for the initial 6 months after a "Myocardial Infarction" (Heart Attack). . Blood thinners: It is imperative that you stop these medications before procedures. Let us know if you if you take any blood thinner.  . Infection: Avoid procedures during or within two weeks of an infection (including chest colds or gastrointestinal problems). Symptoms associated with infections include: Localized redness, fever, chills, night sweats or profuse sweating, burning sensation when voiding, cough, congestion, stuffiness, runny nose, sore throat, diarrhea, nausea, vomiting, cold or Flu symptoms, recent or current infections. It is specially important if the infection is over the area that we intend to treat. . Heart and lung problems: Symptoms that may suggest an active cardiopulmonary problem include: cough, chest pain, breathing difficulties or shortness of breath, dizziness, ankle swelling, uncontrolled high or unusually low blood pressure, and/or palpitations. If you are experiencing any of these symptoms, cancel your procedure and contact your primary care physician for an evaluation.  Remember:  Regular Business hours are:    Monday to Thursday 8:00 AM to 4:00 PM  Provider's  Schedule: Torsha Lemus, MD:  Procedure days: Tuesday and Thursday 7:30 AM to 4:00 PM  Bilal Lateef, MD:  Procedure days: Monday and Wednesday 7:30 AM to 4:00 PM ____________________________________________________________________________________________    

## 2020-08-08 NOTE — Progress Notes (Signed)
Safety precautions to be maintained throughout the outpatient stay will include: orient to surroundings, keep bed in low position, maintain call bell within reach at all times, provide assistance with transfer out of bed and ambulation.  

## 2020-08-10 ENCOUNTER — Other Ambulatory Visit: Payer: Self-pay

## 2020-08-10 ENCOUNTER — Emergency Department
Admission: EM | Admit: 2020-08-10 | Discharge: 2020-08-10 | Disposition: A | Discharge status: Home / Self Care | Payer: Medicare Other | Attending: Emergency Medicine | Admitting: Emergency Medicine

## 2020-08-10 DIAGNOSIS — Z5321 Procedure and treatment not carried out due to patient leaving prior to being seen by health care provider: Secondary | ICD-10-CM | POA: Insufficient documentation

## 2020-08-10 DIAGNOSIS — R0602 Shortness of breath: Secondary | ICD-10-CM | POA: Insufficient documentation

## 2020-08-10 DIAGNOSIS — R251 Tremor, unspecified: Secondary | ICD-10-CM | POA: Insufficient documentation

## 2020-08-10 DIAGNOSIS — D509 Iron deficiency anemia, unspecified: Secondary | ICD-10-CM | POA: Diagnosis not present

## 2020-08-10 LAB — CBC WITH DIFFERENTIAL/PLATELET
Abs Immature Granulocytes: 0.04 10*3/uL (ref 0.00–0.07)
Basophils Absolute: 0 10*3/uL (ref 0.0–0.1)
Basophils Relative: 0 %
Eosinophils Absolute: 0.2 10*3/uL (ref 0.0–0.5)
Eosinophils Relative: 2 %
HCT: 37.6 % (ref 36.0–46.0)
Hemoglobin: 12 g/dL (ref 12.0–15.0)
Immature Granulocytes: 0 %
Lymphocytes Relative: 26 %
Lymphs Abs: 2.4 10*3/uL (ref 0.7–4.0)
MCH: 27.5 pg (ref 26.0–34.0)
MCHC: 31.9 g/dL (ref 30.0–36.0)
MCV: 86 fL (ref 80.0–100.0)
Monocytes Absolute: 0.7 10*3/uL (ref 0.1–1.0)
Monocytes Relative: 7 %
Neutro Abs: 5.8 10*3/uL (ref 1.7–7.7)
Neutrophils Relative %: 65 %
Platelets: 274 10*3/uL (ref 150–400)
RBC: 4.37 MIL/uL (ref 3.87–5.11)
RDW: 14.8 % (ref 11.5–15.5)
Smear Review: NORMAL
WBC: 9.1 10*3/uL (ref 4.0–10.5)
nRBC: 0 % (ref 0.0–0.2)

## 2020-08-10 LAB — BASIC METABOLIC PANEL
Anion gap: 10 (ref 5–15)
BUN: 17 mg/dL (ref 8–23)
CO2: 27 mmol/L (ref 22–32)
Calcium: 9.9 mg/dL (ref 8.9–10.3)
Chloride: 100 mmol/L (ref 98–111)
Creatinine, Ser: 0.87 mg/dL (ref 0.44–1.00)
GFR, Estimated: >60 mL/min (ref >60)
Glucose, Bld: 131 mg/dL — ABNORMAL HIGH (ref 70–99)
Potassium: 3.4 mmol/L — ABNORMAL LOW (ref 3.5–5.1)
Sodium: 137 mmol/L (ref 135–145)

## 2020-08-10 LAB — CBG MONITORING, ED: Glucose-Capillary: 128 mg/dL — ABNORMAL HIGH (ref 70–99)

## 2020-08-10 NOTE — ED Triage Notes (Signed)
Pt comes into the ED via EMS from home , states she woke up at 950am shaking, just doesn't know what is causing it, thought it may be related to sugar but it was 94 at home,  158/96, 130/74, 106/69, CBG 150

## 2020-08-10 NOTE — ED Triage Notes (Signed)
Pt to ED via EMS for "shaking" that started when she got up at 0915. Pt states her arms and legs are shaking, minimal shaking noted with arms extended.  Denies weakness, CP Reports SHOB that started at 1400.  Pt in NAD, alert and oriented, RR even and unlabored, speaking in complete sentences.

## 2020-08-10 NOTE — ED Notes (Signed)
This RN discussed pt with Dr Joni Fears

## 2020-08-21 ENCOUNTER — Other Ambulatory Visit: Payer: Self-pay

## 2020-08-21 ENCOUNTER — Ambulatory Visit (HOSPITAL_BASED_OUTPATIENT_CLINIC_OR_DEPARTMENT_OTHER): Payer: Medicare Other | Admitting: Pain Medicine

## 2020-08-21 ENCOUNTER — Ambulatory Visit
Admission: RE | Admit: 2020-08-21 | Discharge: 2020-08-21 | Disposition: A | Discharge status: Home / Self Care | Payer: Medicare Other | Source: Ambulatory Visit | Attending: Pain Medicine | Admitting: Pain Medicine

## 2020-08-21 ENCOUNTER — Encounter: Payer: Self-pay | Admitting: Pain Medicine

## 2020-08-21 VITALS — BP 128/62 | HR 70 | Temp 97.6°F | Resp 19 | Ht 65.0 in | Wt 189.0 lb

## 2020-08-21 DIAGNOSIS — R937 Abnormal findings on diagnostic imaging of other parts of musculoskeletal system: Secondary | ICD-10-CM | POA: Diagnosis present

## 2020-08-21 DIAGNOSIS — G8929 Other chronic pain: Secondary | ICD-10-CM

## 2020-08-21 DIAGNOSIS — M79605 Pain in left leg: Secondary | ICD-10-CM | POA: Insufficient documentation

## 2020-08-21 DIAGNOSIS — M48062 Spinal stenosis, lumbar region with neurogenic claudication: Secondary | ICD-10-CM

## 2020-08-21 DIAGNOSIS — M4316 Spondylolisthesis, lumbar region: Secondary | ICD-10-CM

## 2020-08-21 DIAGNOSIS — M5136 Other intervertebral disc degeneration, lumbar region: Secondary | ICD-10-CM | POA: Diagnosis present

## 2020-08-21 DIAGNOSIS — M961 Postlaminectomy syndrome, not elsewhere classified: Secondary | ICD-10-CM | POA: Insufficient documentation

## 2020-08-21 DIAGNOSIS — M51369 Other intervertebral disc degeneration, lumbar region without mention of lumbar back pain or lower extremity pain: Secondary | ICD-10-CM

## 2020-08-21 DIAGNOSIS — M5442 Lumbago with sciatica, left side: Secondary | ICD-10-CM | POA: Diagnosis present

## 2020-08-21 MED ORDER — MIDAZOLAM HCL 5 MG/5ML IJ SOLN
INTRAMUSCULAR | Status: AC
  Filled 2020-08-21: qty 5

## 2020-08-21 MED ORDER — MIDAZOLAM HCL 5 MG/5ML IJ SOLN
1.0000 mg | INTRAMUSCULAR | Status: DC | PRN
  Administered 2020-08-21: 1.5 mg via INTRAVENOUS

## 2020-08-21 MED ORDER — LIDOCAINE HCL 2 % IJ SOLN
20.0000 mL | Freq: Once | INTRAMUSCULAR | Status: AC
  Administered 2020-08-21: 400 mg

## 2020-08-21 MED ORDER — SODIUM CHLORIDE 0.9% FLUSH
2.0000 mL | Freq: Once | INTRAVENOUS | Status: AC
  Administered 2020-08-21: 2 mL

## 2020-08-21 MED ORDER — TRIAMCINOLONE ACETONIDE 40 MG/ML IJ SUSP
40.0000 mg | Freq: Once | INTRAMUSCULAR | Status: AC
  Administered 2020-08-21: 40 mg

## 2020-08-21 MED ORDER — ROPIVACAINE HCL 2 MG/ML IJ SOLN
1.0000 mL | Freq: Once | INTRAMUSCULAR | Status: AC
  Administered 2020-08-21: 1 mL via EPIDURAL

## 2020-08-21 MED ORDER — FENTANYL CITRATE (PF) 100 MCG/2ML IJ SOLN
25.0000 ug | INTRAMUSCULAR | Status: DC | PRN
  Administered 2020-08-21: 50 ug via INTRAVENOUS

## 2020-08-21 MED ORDER — SODIUM CHLORIDE 0.9% FLUSH
1.0000 mL | Freq: Once | INTRAVENOUS | Status: AC
  Administered 2020-08-21: 1 mL

## 2020-08-21 MED ORDER — DEXAMETHASONE SODIUM PHOSPHATE 10 MG/ML IJ SOLN
10.0000 mg | Freq: Once | INTRAMUSCULAR | Status: AC
  Administered 2020-08-21: 10 mg

## 2020-08-21 MED ORDER — DEXAMETHASONE SODIUM PHOSPHATE 10 MG/ML IJ SOLN
INTRAMUSCULAR | Status: AC
  Filled 2020-08-21: qty 1

## 2020-08-21 MED ORDER — FENTANYL CITRATE (PF) 100 MCG/2ML IJ SOLN
INTRAMUSCULAR | Status: AC
  Filled 2020-08-21: qty 2

## 2020-08-21 MED ORDER — IOHEXOL 180 MG/ML  SOLN
10.0000 mL | Freq: Once | INTRAMUSCULAR | Status: AC
  Administered 2020-08-21: 10 mL via EPIDURAL

## 2020-08-21 MED ORDER — SODIUM CHLORIDE (PF) 0.9 % IJ SOLN
INTRAMUSCULAR | Status: AC
  Filled 2020-08-21: qty 10

## 2020-08-21 MED ORDER — LACTATED RINGERS IV SOLN
1000.0000 mL | Freq: Once | INTRAVENOUS | Status: AC
  Administered 2020-08-21: 1000 mL via INTRAVENOUS

## 2020-08-21 MED ORDER — TRIAMCINOLONE ACETONIDE 40 MG/ML IJ SUSP
INTRAMUSCULAR | Status: AC
  Filled 2020-08-21: qty 1

## 2020-08-21 MED ORDER — ROPIVACAINE HCL 2 MG/ML IJ SOLN
INTRAMUSCULAR | Status: AC
  Filled 2020-08-21: qty 10

## 2020-08-21 MED ORDER — ROPIVACAINE HCL 2 MG/ML IJ SOLN
2.0000 mL | Freq: Once | INTRAMUSCULAR | Status: AC
  Administered 2020-08-21: 2 mL via EPIDURAL

## 2020-08-21 NOTE — Progress Notes (Signed)
Safety precautions to be maintained throughout the outpatient stay will include: orient to surroundings, keep bed in low position, maintain call bell within reach at all times, provide assistance with transfer out of bed and ambulation.  

## 2020-08-21 NOTE — Progress Notes (Signed)
PROVIDER NOTE: Information contained herein reflects review and annotations entered in association with encounter. Interpretation of such information and data should be left to medically-trained personnel. Information provided to patient can be located elsewhere in the medical record under "Patient Instructions". Document created using STT-dictation technology, any transcriptional errors that may result from process are unintentional.    Patient: Lynn Potter  Service Category: Procedure  Provider: Gaspar Cola, MD  DOB: 09-Mar-1946  DOS: 08/21/2020  Location: [facility]  MRN: [id number]  Setting: Ambulatory - outpatient  Referring Provider: Remi Haggard, FNP  Type: Established Patient  Specialty: Interventional Pain Management  PCP: Remi Haggard, FNP   Primary Reason for Visit: Interventional Pain Management Treatment. CC: Back Pain (left, lower)  Procedure #1:  Anesthesia, Analgesia, Anxiolysis:  Type: Therapeutic Trans-Foraminal Epidural Steroid Injection   #2  Region: Lumbar Level: L3 Paravertebral Laterality: Left Paravertebral   Type: Moderate (Conscious) Sedation combined with Local Anesthesia Indication(s): Analgesia and Anxiety Route: Intravenous (IV) IV Access: Secured Sedation: Meaningful verbal contact was maintained at all times during the procedure  Local Anesthetic: Lidocaine 1-2%  Position: Prone  Procedure #2:    Type: Therapeutic Inter-Laminar Epidural Steroid Injection   #2  Region: Lumbar Level: L2-3 Level. Laterality: Right Paramedial     Indications: 1. DDD (degenerative disc disease), lumbar   2. Grade 1 Anterolisthesis of lumbar spine (13mm) (L3-4)   3. 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 Chronic low back pain (1ry area of Pain) (Bilateral) (L>R) w/ sciatica (Left)   5. Chronic lower extremity pain (2ry area of Pain) (Left)   6. Failed back surgical syndrome   7. Abnormal MRI,  lumbar spine (09/26/18)    Pain Score: Pre-procedure: 10-Worst pain ever/10 Post-procedure: 0-No pain/10   Pre-op Assessment:  Ms. Basara is a 74 y.o. (year old), female patient, seen today for interventional treatment. She  has a past surgical history that includes Total knee arthroplasty; Abdominal hysterectomy; Cholecystectomy; Colonoscopy with esophagogastroduodenoscopy (egd); carpal tunnell; Eye surgery; Cataract extraction; Back surgery; Lumbar fusion; Upper esophageal endoscopic ultrasound (eus) (N/A, 01/03/2016); Esophagogastroduodenoscopy (egd) with propofol (N/A, 02/16/2017); Carpal tunnel release; REPAIR ROTATOR CUFF TEAR; Joint replacement (Bilateral, 1999); Esophagogastroduodenoscopy (egd) with propofol (N/A, 08/03/2019); and Colonoscopy with propofol (N/A, 08/03/2019). Ms. Madariaga has a current medication list which includes the following prescription(s): amlodipine, vitamin c, aspirin, cholecalciferol, combivent respimat, ferrous sulfate, folic acid, hydroxyzine, insulin lispro, jardiance, latanoprost, levemir flextouch, metformin, olmesartan-hydrochlorothiazide, fish oil, pantoprazole, pregabalin, ropinirole, trazodone, cyanocobalamin, and atorvastatin, and the following Facility-Administered Medications: fentanyl and midazolam. Her primarily concern today is the Back Pain (left, lower)  Initial Vital Signs:  Pulse/HCG Rate: 70ECG Heart Rate: 85 Temp: 97.7 F (36.5 C) Resp: 18 BP: 131/88 SpO2: 100 %  BMI: Estimated body mass index is 31.45 kg/m as calculated from the following:   Height as of this encounter: 5\' 5"  (1.651 m).   Weight as of this encounter: 189 lb (85.7 kg).  Risk Assessment: Allergies: Reviewed. She has No Known Allergies.  Allergy Precautions: None required Coagulopathies: Reviewed. None identified.  Blood-thinner therapy: None at this time Active Infection(s): Reviewed. None identified. Ms. Vitali is afebrile  Site Confirmation: Ms. Parslow was asked to  confirm the procedure and laterality before marking the site Procedure checklist: Completed Consent: Before the procedure and under the influence of no sedative(s), amnesic(s), or anxiolytics, the patient was informed of the treatment options, risks and possible complications. To fulfill our ethical and  legal obligations, as recommended by the American Medical Association's Code of Ethics, I have informed the patient of my clinical impression; the nature and purpose of the treatment or procedure; the risks, benefits, and possible complications of the intervention; the alternatives, including doing nothing; the risk(s) and benefit(s) of the alternative treatment(s) or procedure(s); and the risk(s) and benefit(s) of doing nothing. The patient was provided information about the general risks and possible complications associated with the procedure. These may include, but are not limited to: failure to achieve desired goals, infection, bleeding, organ or nerve damage, allergic reactions, paralysis, and death. In addition, the patient was informed of those risks and complications associated to Spine-related procedures, such as failure to decrease pain; infection (i.e.: Meningitis, epidural or intraspinal abscess); bleeding (i.e.: epidural hematoma, subarachnoid hemorrhage, or any other type of intraspinal or peri-dural bleeding); organ or nerve damage (i.e.: Any type of peripheral nerve, nerve root, or spinal cord injury) with subsequent damage to sensory, motor, and/or autonomic systems, resulting in permanent pain, numbness, and/or weakness of one or several areas of the body; allergic reactions; (i.e.: anaphylactic reaction); and/or death. Furthermore, the patient was informed of those risks and complications associated with the medications. These include, but are not limited to: allergic reactions (i.e.: anaphylactic or anaphylactoid reaction(s)); adrenal axis suppression; blood sugar elevation that in diabetics  may result in ketoacidosis or comma; water retention that in patients with history of congestive heart failure may result in shortness of breath, pulmonary edema, and decompensation with resultant heart failure; weight gain; swelling or edema; medication-induced neural toxicity; particulate matter embolism and blood vessel occlusion with resultant organ, and/or nervous system infarction; and/or aseptic necrosis of one or more joints. Finally, the patient was informed that Medicine is not an exact science; therefore, there is also the possibility of unforeseen or unpredictable risks and/or possible complications that may result in a catastrophic outcome. The patient indicated having understood very clearly. We have given the patient no guarantees and we have made no promises. Enough time was given to the patient to ask questions, all of which were answered to the patient's satisfaction. Ms. Pierro has indicated that she wanted to continue with the procedure. Attestation: I, the ordering provider, attest that I have discussed with the patient the benefits, risks, side-effects, alternatives, likelihood of achieving goals, and potential problems during recovery for the procedure that I have provided informed consent. Date  Time: 08/21/2020  9:08 AM  Pre-Procedure Preparation:  Monitoring: As per clinic protocol. Respiration, ETCO2, SpO2, BP, heart rate and rhythm monitor placed and checked for adequate function Safety Precautions: Patient was assessed for positional comfort and pressure points before starting the procedure. Time-out: I initiated and conducted the "Time-out" before starting the procedure, as per protocol. The patient was asked to participate by confirming the accuracy of the "Time Out" information. Verification of the correct person, site, and procedure were performed and confirmed by me, the nursing staff, and the patient. "Time-out" conducted as per Joint Commission's Universal Protocol  (UP.01.01.01). Time: 0945  Description of Procedure #1:  Target Area: The inferior and lateral portion of the pedicle, just lateral to a line created by the 6:00 position of the pedicle and the superior articular process of the vertebral body below. On the lateral view, this target lies just posterior to the anterior aspect of the lamina and posterior to the midpoint created between the anterior and the posterior aspect of the neural foramina. Approach: Posterior paravertebral approach. Area Prepped: Entire Posterior Lumbosacral Region  DuraPrep (Iodine Povacrylex [0.7% available iodine] and Isopropyl Alcohol, 74% w/w) Safety Precautions: Aspiration looking for blood return was conducted prior to all injections. At no point did we inject any substances, as a needle was being advanced. No attempts were made at seeking any paresthesias. Safe injection practices and needle disposal techniques used. Medications properly checked for expiration dates. SDV (single dose vial) medications used.  Description of the Procedure: Protocol guidelines were followed. The patient was placed in position over the fluoroscopy table. The target area was identified and the area prepped in the usual manner. Skin & deeper tissues infiltrated with local anesthetic. Appropriate amount of time allowed to pass for local anesthetics to take effect. The procedure needles were then advanced to the target area. Proper needle placement secured. Negative aspiration confirmed. Solution injected in intermittent fashion, asking for systemic symptoms every 0.2cc of injectate. The needles were then removed and the area cleansed, making sure to leave some of the prepping solution back to take advantage of its long term bactericidal properties.  Start Time: 0945 hrs.  Materials:  Needle(s) Type: Spinal Needle Gauge: 22G Length: 3.5-in Medication(s): Please see orders for medications and dosing details.  Description of Procedure #2:   Target Area: The  interlaminar space, initially targeting the lower border of the superior vertebral body lamina. Approach: Posterior paramedial approach. Area Prepped: Same as above Prepping solution: Same as above Safety Precautions: Same as above  Description of the Procedure: Protocol guidelines were followed. The patient was placed in position over the fluoroscopy table. The target area was identified and the area prepped in the usual manner. Skin & deeper tissues infiltrated with local anesthetic. Appropriate amount of time allowed to pass for local anesthetics to take effect. The procedure needle was introduced through the skin, ipsilateral to the reported pain, and advanced to the target area. Bone was contacted and the needle walked caudad, until the lamina was cleared. The ligamentum flavum was engaged and loss-of-resistance technique used as the epidural needle was advanced. The epidural space was identified using "loss-of-resistance technique" with 2-3 ml of PF-NaCl (0.9% NSS), in a 5cc LOR glass syringe. Proper needle placement secured. Negative aspiration confirmed. Solution injected in intermittent fashion, asking for systemic symptoms every 0.5cc of injectate. The needles were then removed and the area cleansed, making sure to leave some of the prepping solution back to take advantage of its long term bactericidal properties.  Vitals:   08/21/20 0956 08/21/20 1006 08/21/20 1016 08/21/20 1026  BP: (!) 140/95 119/61 125/68 128/62  Pulse:      Resp: 16 18 19 19   Temp:  97.7 F (36.5 C)  97.6 F (36.4 C)  TempSrc:      SpO2: 100% 100% 100% 100%  Weight:      Height:        End Time: 0950 hrs.  Materials:  Needle(s) Type: Epidural needle Gauge: 17G Length: 3.5-in Medication(s): Please see orders for medications and dosing details.  Imaging Guidance (Spinal):          Type of Imaging Technique: Fluoroscopy Guidance (Spinal) Indication(s): Assistance in needle guidance and  placement for procedures requiring needle placement in or near specific anatomical locations not easily accessible without such assistance. Exposure Time: Please see nurses notes. Contrast: Before injecting any contrast, we confirmed that the patient did not have an allergy to iodine, shellfish, or radiological contrast. Once satisfactory needle placement was completed at the desired level, radiological contrast was injected. Contrast injected under live fluoroscopy. No  contrast complications. See chart for type and volume of contrast used. Fluoroscopic Guidance: I was personally present during the use of fluoroscopy. "Tunnel Vision Technique" used to obtain the best possible view of the target area. Parallax error corrected before commencing the procedure. "Direction-depth-direction" technique used to introduce the needle under continuous pulsed fluoroscopy. Once target was reached, antero-posterior, oblique, and lateral fluoroscopic projection used confirm needle placement in all planes. Images permanently stored in EMR. Interpretation: I personally interpreted the imaging intraoperatively. Adequate needle placement confirmed in multiple planes. Appropriate spread of contrast into desired area was observed. No evidence of afferent or efferent intravascular uptake. No intrathecal or subarachnoid spread observed. Permanent images saved into the patient's record.  Antibiotic Prophylaxis:   Anti-infectives (From admission, onward)   None     Indication(s): None identified  Post-operative Assessment:  Post-procedure Vital Signs:  Pulse/HCG Rate: 7072 Temp: 97.6 F (36.4 C) Resp: 19 BP: 128/62 SpO2: 100 %  EBL: None  Complications: No immediate post-treatment complications observed by team, or reported by patient.  Note: The patient tolerated the entire procedure well. A repeat set of vitals were taken after the procedure and the patient was kept under observation following institutional  policy, for this type of procedure. Post-procedural neurological assessment was performed, showing return to baseline, prior to discharge. The patient was provided with post-procedure discharge instructions, including a section on how to identify potential problems. Should any problems arise concerning this procedure, the patient was given instructions to immediately contact us, at any time, without hesitation. In any case, we plan to contact the patient by telephone for a follow-up status report regarding this interventional procedure.  Comments:  No additional relevant information.  Plan of Care  Orders:  Orders Placed This Encounter  Procedures  . Lumbar Epidural Injection    Scheduling Instructions:     Procedure: Interlaminar LESI L2-3     Laterality: Right-sided     Sedation: Patient's choice     Timeframe:  Today    Order Specific Question:   Where will this procedure be performed?    Answer:   [facility]  . Lumbar Transforaminal Epidural    Scheduling Instructions:     Side: Left-sided     Level: L3     Sedation: Patient's choice.     Timeframe: Today    Order Specific Question:   Where will this procedure be performed?    Answer:   [facility]  . DG PAIN [facility] C-ARM 1-60 MIN NO REPORT    Intraoperative interpretation by procedural physician at [place].    Standing Status:   Standing    Number of Occurrences:   1    Order Specific Question:   Reason for exam:    Answer:   Assistance in needle guidance and placement for procedures requiring needle placement in or near specific anatomical locations not easily accessible without such assistance.  . Informed Consent Details: Physician/Practitioner Attestation; Transcribe to consent form and obtain patient signature    Note: Always confirm laterality of pain with Ms. Borrelli, before procedure. Transcribe to consent form and obtain patient signature.    Order Specific Question:    Physician/Practitioner attestation of informed consent for procedure/surgical case    Answer:   I, the physician/practitioner, attest that I have discussed with the patient the benefits, risks, side effects, alternatives, likelihood of achieving goals and potential problems during recovery for the procedure that I have provided informed consent.    Order  Specific Question:   Procedure    Answer:   Lumbar epidural steroid injection under fluoroscopic guidance    Order Specific Question:   Physician/Practitioner performing the procedure    Answer:   Ninoska Goswick A. Dossie Arbour, MD    Order Specific Question:   Indication/Reason    Answer:   Low back and/or lower extremity pain secondary to lumbar radiculitis  . Informed Consent Details: Physician/Practitioner Attestation; Transcribe to consent form and obtain patient signature    Provider Attestation: I, [place] Dossie Arbour, MD, (Pain Management Specialist), the physician/practitioner, attest that I have discussed with the patient the benefits, risks, side effects, alternatives, likelihood of achieving goals and potential problems during recovery for the procedure that I have provided informed consent.    Scheduling Instructions:     Note: Always confirm laterality of pain with Ms. Mcmullen, before procedure.     Transcribe to consent form and obtain patient signature.    Order Specific Question:   Physician/Practitioner attestation of informed consent for procedure/surgical case    Answer:   I, the physician/practitioner, attest that I have discussed with the patient the benefits, risks, side effects, alternatives, likelihood of achieving goals and potential problems during recovery for the procedure that I have provided informed consent.    Order Specific Question:   Procedure    Answer:   Diagnostic lumbar transforaminal epidural steroid injection under fluoroscopic guidance. (See notes for level and laterality.)    Order Specific Question:    Physician/Practitioner performing the procedure    Answer:   Emaleigh Guimond A. 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 Provide equipment / supplies at bedside    "Epidural Tray" (Disposable  single use) Catheter: NOT required    Standing Status:   Standing    Number of Occurrences:   1    Order Specific Question:   Specify    Answer:   Epidural Tray   Chronic Opioid Analgesic:  No opioid analgesics from our practice.    Medications ordered for procedure: Meds ordered this encounter  Medications  . iohexol (OMNIPAQUE) 180 MG/ML injection 10 mL    Must be Myelogram-compatible. If not available, you may substitute with a water-soluble, non-ionic, hypoallergenic, myelogram-compatible radiological contrast medium.  Marland Kitchen lidocaine (XYLOCAINE) 2 % (with pres) injection 400 mg  . lactated ringers infusion 1,000 mL  . midazolam (VERSED) 5 MG/5ML injection 1-2 mg    Make sure Flumazenil is available in the pyxis when using this medication. If oversedation occurs, administer 0.2 mg IV over 15 sec. If after 45 sec no response, administer 0.2 mg again over 1 min; may repeat at 1 min intervals; not to exceed 4 doses (1 mg)  . fentaNYL (SUBLIMAZE) injection 25-50 mcg    Make sure Narcan is available in the pyxis when using this medication. In the event of respiratory depression (RR< 8/min): Titrate NARCAN (naloxone) in increments of 0.1 to 0.2 mg IV at 2-3 minute intervals, until desired degree of reversal.  . sodium chloride flush (NS) 0.9 % injection 2 mL  . ropivacaine (PF) 2 mg/mL (0.2%) (NAROPIN) injection 2 mL  . triamcinolone acetonide (KENALOG-40) injection 40 mg  . sodium chloride flush (NS) 0.9 % injection 1 mL  . ropivacaine (PF) 2 mg/mL (0.2%) (NAROPIN) injection 1 mL  . dexamethasone (DECADRON) injection 10 mg   Medications administered: We administered iohexol, lidocaine, lactated ringers,  midazolam, fentaNYL, sodium  chloride flush, ropivacaine (PF) 2 mg/mL (0.2%), triamcinolone acetonide, sodium chloride flush, ropivacaine (PF) 2 mg/mL (0.2%), and dexamethasone.  See the medical record for exact dosing, route, and time of administration.  Follow-up plan:   Return in about 2 weeks (around 09/04/2020) for (F2F), (PP) Follow-up.       Interventional management options: Considering: Diagnosticright L3-4LESI Diagnostic bilateral L3 transforaminal LESI Diagnostic bilateral lumbar facet block#2 Possible bilateral lumbar facetRFA Diagnostic bilateralsuprascapularnerve block Possible bilateral suprascapularRFA Diagnostic bilateral T11 transforaminal TESI   Palliative PRN treatment(s): Palliative bilateral lumbar facet block #2 Diagnostic/therapeutic left L3 TFESI #2/S2 + right L2-3 LESI #2/S2 (100/100/100/100)      Recent Visits Date Type Provider Dept  08/08/20 Office Visit Milinda Pointer, MD [facility]  07/26/20 Procedure visit Milinda Pointer, MD [facility]  07/17/20 Office Visit Milinda Pointer, MD [facility]  Showing recent visits within past 90 days and meeting all other requirements Today's Visits Date Type Provider Dept  08/21/20 Procedure visit Milinda Pointer, MD [facility]  Showing today's visits and meeting all other requirements Future Appointments Date Type Provider Dept  09/05/20 Appointment Milinda Pointer, MD [facility]  Showing future appointments within next 90 days and meeting all other requirements  Disposition: Discharge home  Discharge (Date  Time): 08/21/2020; 1033 hrs.   Primary Care Physician: Remi Haggard, FNP Location: [facility] Note by: Gaspar Cola, MD Date: 08/21/2020; Time: 10:38 AM  Disclaimer:  Medicine is not an exact science. The only guarantee in medicine is that nothing is guaranteed. It is  important to note that the decision to proceed with this intervention was based on the information collected from the patient. The Data and conclusions were drawn from the patient's questionnaire, the interview, and the physical examination. Because the information was provided in large part by the patient, it cannot be guaranteed that it has not been purposely or unconsciously manipulated. Every effort has been made to obtain as much relevant data as possible for this evaluation. It is important to note that the conclusions that lead to this procedure are derived in large part from the available data. Always take into account that the treatment will also be dependent on availability of resources and existing treatment guidelines, considered by other Pain Management Practitioners as being common knowledge and practice, at the time of the intervention. For Medico-Legal purposes, it is also important to point out that variation in procedural techniques and pharmacological choices are the acceptable norm. The indications, contraindications, technique, and results of the above procedure should only be interpreted and judged by a Board-Certified Interventional Pain Specialist with extensive familiarity and expertise in the same exact procedure and technique.

## 2020-08-21 NOTE — Patient Instructions (Signed)

## 2020-08-22 ENCOUNTER — Telehealth: Payer: Self-pay

## 2020-08-22 NOTE — Telephone Encounter (Signed)
Post procedure follow up call.  Patient states she has a migraine but is doing ok from the procedure.

## 2020-09-05 ENCOUNTER — Ambulatory Visit: Payer: Medicare Other | Admitting: Pain Medicine

## 2020-09-05 NOTE — Progress Notes (Deleted)
No show

## 2020-09-12 ENCOUNTER — Other Ambulatory Visit: Payer: Self-pay | Admitting: Gastroenterology

## 2020-09-12 DIAGNOSIS — Z8719 Personal history of other diseases of the digestive system: Secondary | ICD-10-CM

## 2020-09-12 DIAGNOSIS — R1319 Other dysphagia: Secondary | ICD-10-CM

## 2020-09-12 DIAGNOSIS — K2100 Gastro-esophageal reflux disease with esophagitis, without bleeding: Secondary | ICD-10-CM

## 2020-09-16 NOTE — Progress Notes (Signed)
PROVIDER NOTE: Information contained herein reflects review and annotations entered in association with encounter. Interpretation of such information and data should be left to medically-trained personnel. Information provided to patient can be located elsewhere in the medical record under "Patient Instructions". Document created using STT-dictation technology, any transcriptional errors that may result from process are unintentional.    Patient: Lynn Potter  Service Category: E/M  Provider: Francisco A Naveira, MD  DOB: 08/11/1946  DOS: 09/17/2020  Specialty: Interventional Pain Management  MRN: [id number]  Setting: Ambulatory outpatient  PCP: Lindley, Cheryl P, FNP  Type: Established Patient    Referring Provider: Lindley, Cheryl P, FNP  Location: Office  Delivery: Face-to-face     HPI  [name], a 74 y.o. year old female, is here today because of her Chronic bilateral low back pain with left-sided sciatica [M54.42, G89.29]. [name] primary complain today is No chief complaint on file. Last encounter: My last encounter with her was on 09/05/2020. Pertinent problems: [name] has Osteoarthritis of shoulder (Bilateral); Osteoarthritis of shoulder (Right); Rotator cuff tendinitis; History of rotator cuff surgery (Bilateral); Chronic pain syndrome; Degenerative joint disease (DJD) of hip; Sacroiliac joint pain; Chronic low back pain (1ry area of Pain) (Bilateral) (L>R) w/ sciatica (Left); Chronic lower extremity pain (2ry area of Pain) (Left); Chronic shoulder pain (3ry area of Pain) (Bilateral) (R>L); Chronic hip pain (Left); Osteoarthritis of hip (Left); Lumbar facet syndrome (Bilateral) (L>R); Spondylosis without myelopathy or radiculopathy, lumbosacral region; Chronic low back pain (1ry area of Pain) (Bilateral) (L>R) w/o sciatica; Abnormal CT of  lumbar spine (10/11/2018); Lumbar facet hypertrophy (Multilevel); DDD (degenerative disc disease), lumbar; Failed back surgical syndrome; 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 Pain Assessment: Severity of Chronic pain is reported as a 7 /10. Location: Back Lower,Left,Right/into left hip and leg. Onset: More than a month ago. Quality: Discomfort,Constant,Sharp,Cramping,Throbbing,Aching. Timing: Constant. Modifying factor(s): procedure helped and continues to help but the pain is starting back.. Vitals:  height is 5' 4" (1.626 m) and weight is 189 lb (85.7 kg). Her temporal temperature is 97.1 F (36.2 C) (abnormal). Her blood pressure is 112/73 and her pulse is 103 (abnormal). Her respiration is 16 and oxygen saturation is 100%.   Reason for encounter: post-procedure assessment.  The patient indicates having attained 100% relief of the pain that lasted for a couple weeks, unfortunately the pain is beginning to come back.  We have already done to of the epidural steroid injections and today we will talk to the patient about bleeding the series of 3.  Post-Procedure Evaluation  Procedure (09/05/2020): Therapeutic left L3 TFESI #2 + right L2-3 LESI #2 under fluoroscopic guidance and IV sedation Pre-procedure pain level: 10/10 Post-procedure: 0/10 (100% relief)  Sedation: Sedation provided.  Effectiveness during initial hour after procedure(Ultra-Short Term Relief): 100 %.  Local anesthetic used: Long-acting (4-6 hours) Effectiveness: Defined as any analgesic benefit obtained secondary to the administration of local anesthetics. This carries significant diagnostic value as to the etiological location, or anatomical origin, of the pain. Duration of benefit is expected to coincide with the duration of the local anesthetic used.  Effectiveness during initial 4-6 hours after  procedure(Short-Term Relief): 100 %.  Long-term benefit: Defined as any relief past the pharmacologic duration of the local anesthetics.  Effectiveness past the   initial 6 hours after procedure(Long-Term Relief): 100 % (pain relief was at 100% up until last thursday and now the pain is gradually working its way back up.).  Current benefits: Defined as benefit that persist at this time.   Analgesia:  90-100% better, but slowly returning. Function: Lynn Potter reports improvement in function ROM: Lynn Potter reports improvement in ROM  Pharmacotherapy Assessment   Analgesic: No opioid analgesics from our practice.    Monitoring: [place] PMP: PDMP reviewed during this encounter.       Pharmacotherapy: No side-effects or adverse reactions reported. Compliance: No problems identified. Effectiveness: Clinically acceptable.  No notes on file  UDS:  Summary  Date Value Ref Range Status  11/09/2018 FINAL  Final    Comment:    ==================================================================== [facility] COMP DRUG ANALYSIS,UR ==================================================================== Test                             Result       Flag       Units Drug Present and Declared for Prescription Verification   Pregabalin                     PRESENT      EXPECTED   Baclofen                       PRESENT      EXPECTED   Methocarbamol                  PRESENT      EXPECTED   Hydroxyzine                    PRESENT      EXPECTED Drug Absent but Declared for Prescription Verification   Salicylate                     Not Detected UNEXPECTED    Aspirin, as indicated in the declared medication list, is not    always detected even when used as directed. ==================================================================== Test                      Result    Flag   Units      Ref Range   Creatinine              58               mg/dL       >=20 ==================================================================== Declared Medications:  The flagging and interpretation on this report are based on the  following declared medications.  Unexpected results may arise from  inaccuracies in the declared medications.  **Note: The testing scope of this panel includes these medications:  Baclofen  Hydroxyzine  Methocarbamol  Pregabalin (Lyrica)  **Note: The testing scope of this panel does not include small to  moderate amounts of these reported medications:  Aspirin (Aspirin 81)  **Note: The testing scope of this panel does not include following  reported medications:  Amlodipine  Ascorbic Acid  Atorvastatin  Azelastine  Benazepril  Cholecalciferol  Empagliflozin (Jardiance)  Fluconazole (Diflucan)  Insulin  Iron (Ferrous Sulfate)  Latanoprost  Metformin  Olmesartan (Benicar)  Pantoprazole  Ropinirole (Requip) ==================================================================== For clinical consultation, please call [phone]. ====================================================================      ROS  Constitutional: Denies any fever or chills Gastrointestinal: No reported hemesis, hematochezia, vomiting, or acute GI distress Musculoskeletal:   Denies any acute onset joint swelling, redness, loss of ROM, or weakness Neurological: No reported episodes of acute onset apraxia, aphasia, dysarthria, agnosia, amnesia, paralysis, loss of coordination, or loss of consciousness  Medication Review  Cholecalciferol, Fish Oil, Ipratropium-Albuterol, amLODipine, aspirin, atorvastatin, cyanocobalamin, empagliflozin, ferrous sulfate, folic acid, hydrOXYzine, insulin detemir, insulin lispro, latanoprost, metFORMIN, olmesartan-hydrochlorothiazide, pantoprazole, pregabalin, rOPINIRole, traZODone, and vitamin C  History Review  Allergy: [name] has No Known Allergies. Drug: [name]  reports no history of drug  use. Alcohol:  reports no history of alcohol use. Tobacco:  reports that she has been smoking cigarettes. She has been smoking about 1.00 pack per day. She has never used smokeless tobacco. Social: [name]  reports that she has been smoking cigarettes. She has been smoking about 1.00 pack per day. She has never used smokeless tobacco. She reports that she does not drink alcohol and does not use drugs. Medical:  has a past medical history of AF (atrial fibrillation) (HCC), AKI (acute kidney injury) (HCC) (07/21/2019), Arthritis, Asthma, Chicken pox, Clotting disorder (HCC), Depression, Diabetes mellitus without complication (HCC), Diverticulosis, Dysrhythmia, GERD (gastroesophageal reflux disease), Glaucoma, Hemorrhoids, Hemorrhoids, internal, HH (hiatus hernia), History of hiatal hernia, History of kidney stones, Hypertension, Migraines, Neuropathy, Obesity, Opiate use (03/16/2017), Personal history of tobacco use, presenting hazards to health (06/18/2015), Sleep apnea, and Tubular adenoma of colon. Surgical: [name]  has a past surgical history that includes Total knee arthroplasty; Abdominal hysterectomy; Cholecystectomy; Colonoscopy with esophagogastroduodenoscopy (egd); carpal tunnell; Eye surgery; Cataract extraction; Back surgery; Lumbar fusion; Upper esophageal endoscopic ultrasound (eus) (N/A, 01/03/2016); Esophagogastroduodenoscopy (egd) with propofol (N/A, 02/16/2017); Carpal tunnel release; REPAIR ROTATOR CUFF TEAR; Joint replacement (Bilateral, 1999); Esophagogastroduodenoscopy (egd) with propofol (N/A, 08/03/2019); and Colonoscopy with propofol (N/A, 08/03/2019). Family: family history includes Diabetes in her mother and sister.  Laboratory Chemistry Profile   Renal Lab Results  Component Value Date   BUN 17 08/10/2020   CREATININE 0.87 08/10/2020   BCR 10 (L) 11/09/2018   GFRAA >60 07/23/2019   GFRNONAA >60 08/10/2020     Hepatic Lab Results  Component Value Date   AST 16  08/06/2020   ALT 16 08/06/2020   ALBUMIN 3.9 08/06/2020   ALKPHOS 74 08/06/2020   LIPASE 21 05/23/2018     Electrolytes Lab Results  Component Value Date   NA 137 08/10/2020   K 3.4 (L) 08/10/2020   CL 100 08/10/2020   CALCIUM 9.9 08/10/2020   MG 2.1 07/22/2019     Bone Lab Results  Component Value Date   25OHVITD1 33 11/29/2018   25OHVITD2 2.5 11/29/2018   25OHVITD3 30 11/29/2018     Inflammation (CRP: Acute Phase) (ESR: Chronic Phase) Lab Results  Component Value Date   CRP 16 (H) 11/09/2018   ESRSEDRATE 77 (H) 11/09/2018   LATICACIDVEN 1.9 07/21/2019       Note: Above Lab results reviewed.  Recent Imaging Review  DG PAIN CLINIC C-ARM 1-60 MIN NO REPORT Fluoro was used, but no Radiologist interpretation will be provided.  Please refer to "NOTES" tab for provider progress note. Note: Reviewed        Physical Exam  General appearance: Well nourished, well developed, and well hydrated. In no apparent acute distress Mental status: Alert, oriented x 3 (person, place, & time)       Respiratory: No evidence of acute respiratory distress Eyes: PERLA Vitals: BP 112/73 (BP Location: Left Arm, Patient Position: Sitting, Cuff Size: Large)   Pulse (!) 103   Temp (!)   97.1 F (36.2 C) (Temporal)   Resp 16   Ht 5' 4" (1.626 m)   Wt 189 lb (85.7 kg)   SpO2 100%   BMI 32.44 kg/m  BMI: Estimated body mass index is 32.44 kg/m as calculated from the following:   Height as of this encounter: 5' 4" (1.626 m).   Weight as of this encounter: 189 lb (85.7 kg). Ideal: Ideal body weight: 54.7 kg (120 lb 9.5 oz) Adjusted ideal body weight: 67.1 kg (147 lb 15.3 oz)  Assessment   Status Diagnosis  Controlled Controlled Controlled 1. Chronic low back pain (1ry area of Pain) (Bilateral) (L>R) w/ sciatica (Left)   2. Chronic lower extremity pain (2ry area of Pain) (Left)   3. DDD (degenerative disc disease), lumbar   4. Grade 1 Anterolisthesis of lumbar spine (2mm) (L3-4)   5.  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 Failed back surgical syndrome      Updated Problems: No problems updated.  Plan of Care  Problem-specific:  No problem-specific Assessment & Plan notes found for this encounter.  [name] has a current medication list which includes the following long-term medication(s): ferrous sulfate, insulin lispro, levemir flextouch, pregabalin, trazodone, atorvastatin, and atorvastatin.  Pharmacotherapy (Medications Ordered): No orders of the defined types were placed in this encounter.  Orders:  Orders Placed This Encounter  Procedures  . Lumbar Epidural Injection    Standing Status:   Future    Standing Expiration Date:   10/18/2020    Scheduling Instructions:     Procedure: Interlaminar Lumbar Epidural Steroid injection (LESI)  L2-3     Laterality: Right-sided     Sedation: Patient's choice.     Timeframe: ASAA    Order Specific Question:   Where will this procedure be performed?    Answer:   [facility]  . Lumbar Transforaminal Epidural    Standing Status:   Future    Standing Expiration Date:   10/18/2020    Scheduling Instructions:     Side: Left-sided     Level: L3     Sedation: Patient's choice.     Timeframe: ASAP    Order Specific Question:   Where will this procedure be performed?    Answer:   [facility]   Follow-up plan:   Return for Procedure (w/ sedation): (L) L3 TFESI #3 + (R) L2-3 LESI #3.      Interventional management options: Considering: Diagnosticright L3-4LESI Diagnostic bilateral L3 transforaminal LESI Diagnostic bilateral lumbar facet block#2 Possible bilateral lumbar facetRFA Diagnostic bilateralsuprascapularnerve block Possible bilateral suprascapularRFA Diagnostic bilateral T11 transforaminal TESI   Palliative PRN treatment(s): Palliative bilateral lumbar facet block #2 Diagnostic/therapeutic left L3 TFESI #2/S2 + right  L2-3 LESI #2/S2 (100/100/100/100)       Recent Visits Date Type Provider Dept  08/21/20 Procedure visit Naveira, Francisco, MD [facility]  08/08/20 Office Visit Naveira, Francisco, MD [facility]  07/26/20 Procedure visit Naveira, Francisco, MD [facility]  07/17/20 Office Visit Naveira, Francisco, MD [facility]  Showing recent visits within past 90 days and meeting all other requirements Today's Visits Date Type Provider Dept  09/17/20 Office Visit Naveira, Francisco, MD [facility]  Showing today's visits and meeting all other requirements Future Appointments No visits were found meeting these conditions. Showing future appointments within next 90 days and meeting all other requirements  I discussed the assessment and treatment plan with the patient. The   patient was provided an opportunity to ask questions and all were answered. The patient agreed with the plan and demonstrated an understanding of the instructions.  Patient advised to call back or seek an in-person evaluation if the symptoms or condition worsens.  Duration of encounter: 30 minutes.  Note by: Gaspar Cola, MD Date: 09/17/2020; Time: 9:21 AM

## 2020-09-17 ENCOUNTER — Other Ambulatory Visit: Payer: Self-pay

## 2020-09-17 ENCOUNTER — Encounter: Payer: Self-pay | Admitting: Pain Medicine

## 2020-09-17 ENCOUNTER — Ambulatory Visit: Payer: Medicare Other | Attending: Pain Medicine | Admitting: Pain Medicine

## 2020-09-17 VITALS — BP 112/73 | HR 103 | Temp 97.1°F | Resp 16 | Ht 64.0 in | Wt 189.0 lb

## 2020-09-17 DIAGNOSIS — M79605 Pain in left leg: Secondary | ICD-10-CM

## 2020-09-17 DIAGNOSIS — M48062 Spinal stenosis, lumbar region with neurogenic claudication: Secondary | ICD-10-CM

## 2020-09-17 DIAGNOSIS — M5442 Lumbago with sciatica, left side: Secondary | ICD-10-CM | POA: Diagnosis not present

## 2020-09-17 DIAGNOSIS — M51369 Other intervertebral disc degeneration, lumbar region without mention of lumbar back pain or lower extremity pain: Secondary | ICD-10-CM

## 2020-09-17 DIAGNOSIS — M961 Postlaminectomy syndrome, not elsewhere classified: Secondary | ICD-10-CM | POA: Diagnosis present

## 2020-09-17 DIAGNOSIS — G8929 Other chronic pain: Secondary | ICD-10-CM | POA: Diagnosis present

## 2020-09-17 DIAGNOSIS — M5136 Other intervertebral disc degeneration, lumbar region: Secondary | ICD-10-CM | POA: Diagnosis not present

## 2020-09-17 DIAGNOSIS — M4316 Spondylolisthesis, lumbar region: Secondary | ICD-10-CM

## 2020-09-17 NOTE — Patient Instructions (Addendum)
____________________________________________________________________________________________  Preparing for Procedure with Sedation  Procedure appointments are limited to planned procedures: . No Prescription Refills. . No disability issues will be discussed. . No medication changes will be discussed.  Instructions: . Oral Intake: Do not eat or drink anything for at least 8 hours prior to your procedure. (Exception: Blood Pressure Medication. See below.) . Transportation: Unless otherwise stated by your physician, you may drive yourself after the procedure. . Blood Pressure Medicine: Do not forget to take your blood pressure medicine with a sip of water the morning of the procedure. If your Diastolic (lower reading)is above 100 mmHg, elective cases will be cancelled/rescheduled. . Blood thinners: These will need to be stopped for procedures. Notify our staff if you are taking any blood thinners. Depending on which one you take, there will be specific instructions on how and when to stop it. . Diabetics on insulin: Notify the staff so that you can be scheduled 1st case in the morning. If your diabetes requires high dose insulin, take only  of your normal insulin dose the morning of the procedure and notify the staff that you have done so. . Preventing infections: Shower with an antibacterial soap the morning of your procedure. . Build-up your immune system: Take 1000 mg of Vitamin C with every meal (3 times a day) the day prior to your procedure. . Antibiotics: Inform the staff if you have a condition or reason that requires you to take antibiotics before dental procedures. . Pregnancy: If you are pregnant, call and cancel the procedure. . Sickness: If you have a cold, fever, or any active infections, call and cancel the procedure. . Arrival: You must be in the facility at least 30 minutes prior to your scheduled procedure. . Children: Do not bring children with you. . Dress appropriately:  Bring dark clothing that you would not mind if they get stained. . Valuables: Do not bring any jewelry or valuables.  Reasons to call and reschedule or cancel your procedure: (Following these recommendations will minimize the risk of a serious complication.) . Surgeries: Avoid having procedures within 2 weeks of any surgery. (Avoid for 2 weeks before or after any surgery). . Flu Shots: Avoid having procedures within 2 weeks of a flu shots or . (Avoid for 2 weeks before or after immunizations). . Barium: Avoid having a procedure within 7-10 days after having had a radiological study involving the use of radiological contrast. (Myelograms, Barium swallow or enema study). . Heart attacks: Avoid any elective procedures or surgeries for the initial 6 months after a "Myocardial Infarction" (Heart Attack). . Blood thinners: It is imperative that you stop these medications before procedures. Let us know if you if you take any blood thinner.  . Infection: Avoid procedures during or within two weeks of an infection (including chest colds or gastrointestinal problems). Symptoms associated with infections include: Localized redness, fever, chills, night sweats or profuse sweating, burning sensation when voiding, cough, congestion, stuffiness, runny nose, sore throat, diarrhea, nausea, vomiting, cold or Flu symptoms, recent or current infections. It is specially important if the infection is over the area that we intend to treat. . Heart and lung problems: Symptoms that may suggest an active cardiopulmonary problem include: cough, chest pain, breathing difficulties or shortness of breath, dizziness, ankle swelling, uncontrolled high or unusually low blood pressure, and/or palpitations. If you are experiencing any of these symptoms, cancel your procedure and contact your primary care physician for an evaluation.  Remember:  Regular Business hours are:    Monday to Thursday 8:00 AM to 4:00 PM  Provider's  Schedule: Dawid Dupriest, MD:  Procedure days: Tuesday and Thursday 7:30 AM to 4:00 PM  Bilal Lateef, MD:  Procedure days: Monday and Wednesday 7:30 AM to 4:00 PM ____________________________________________________________________________________________   ____________________________________________________________________________________________  General Risks and Possible Complications  Patient Responsibilities: It is important that you read this as it is part of your informed consent. It is our duty to inform you of the risks and possible complications associated with treatments offered to you. It is your responsibility as a patient to read this and to ask questions about anything that is not clear or that you believe was not covered in this document.  Patient's Rights: You have the right to refuse treatment. You also have the right to change your mind, even after initially having agreed to have the treatment done. However, under this last option, if you wait until the last second to change your mind, you may be charged for the materials used up to that point.  Introduction: Medicine is not an exact science. Everything in Medicine, including the lack of treatment(s), carries the potential for danger, harm, or loss (which is by definition: Risk). In Medicine, a complication is a secondary problem, condition, or disease that can aggravate an already existing one. All treatments carry the risk of possible complications. The fact that a side effects or complications occurs, does not imply that the treatment was conducted incorrectly. It must be clearly understood that these can happen even when everything is done following the highest safety standards.  No treatment: You can choose not to proceed with the proposed treatment alternative. The "PRO(s)" would include: avoiding the risk of complications associated with the therapy. The "CON(s)" would include: not getting any of the treatment  benefits. These benefits fall under one of three categories: diagnostic; therapeutic; and/or palliative. Diagnostic benefits include: getting information which can ultimately lead to improvement of the disease or symptom(s). Therapeutic benefits are those associated with the successful treatment of the disease. Finally, palliative benefits are those related to the decrease of the primary symptoms, without necessarily curing the condition (example: decreasing the pain from a flare-up of a chronic condition, such as incurable terminal cancer).  General Risks and Complications: These are associated to most interventional treatments. They can occur alone, or in combination. They fall under one of the following six (6) categories: no benefit or worsening of symptoms; bleeding; infection; nerve damage; allergic reactions; and/or death. 1. No benefits or worsening of symptoms: In Medicine there are no guarantees, only probabilities. No healthcare provider can ever guarantee that a medical treatment will work, they can only state the probability that it may. Furthermore, there is always the possibility that the condition may worsen, either directly, or indirectly, as a consequence of the treatment. 2. Bleeding: This is more common if the patient is taking a blood thinner, either prescription or over the counter (example: Goody Powders, Fish oil, Aspirin, Garlic, etc.), or if suffering a condition associated with impaired coagulation (example: Hemophilia, cirrhosis of the liver, low platelet counts, etc.). However, even if you do not have one on these, it can still happen. If you have any of these conditions, or take one of these drugs, make sure to notify your treating physician. 3. Infection: This is more common in patients with a compromised immune system, either due to disease (example: diabetes, cancer, human immunodeficiency virus [HIV], etc.), or due to medications or treatments (example: therapies used to treat  cancer and   rheumatological diseases). However, even if you do not have one on these, it can still happen. If you have any of these conditions, or take one of these drugs, make sure to notify your treating physician. 4. Nerve Damage: This is more common when the treatment is an invasive one, but it can also happen with the use of medications, such as those used in the treatment of cancer. The damage can occur to small secondary nerves, or to large primary ones, such as those in the spinal cord and brain. This damage may be temporary or permanent and it may lead to impairments that can range from temporary numbness to permanent paralysis and/or brain death. 5. Allergic Reactions: Any time a substance or material comes in contact with our body, there is the possibility of an allergic reaction. These can range from a mild skin rash (contact dermatitis) to a severe systemic reaction (anaphylactic reaction), which can result in death. 6. Death: In general, any medical intervention can result in death, most of the time due to an unforeseen complication. ____________________________________________________________________________________________  Epidural Steroid Injection Patient Information  Description: The epidural space surrounds the nerves as they exit the spinal cord.  In some patients, the nerves can be compressed and inflamed by a bulging disc or a tight spinal canal (spinal stenosis).  By injecting steroids into the epidural space, we can bring irritated nerves into direct contact with a potentially helpful medication.  These steroids act directly on the irritated nerves and can reduce swelling and inflammation which often leads to decreased pain.  Epidural steroids may be injected anywhere along the spine and from the neck to the low back depending upon the location of your pain.   After numbing the skin with local anesthetic (like Novocaine), a small needle is passed into the epidural space slowly.   You may experience a sensation of pressure while this is being done.  The entire block usually last less than 10 minutes.  Conditions which may be treated by epidural steroids:   Low back and leg pain  Neck and arm pain  Spinal stenosis  Post-laminectomy syndrome  Herpes zoster (shingles) pain  Pain from compression fractures  Preparation for the injection:  1. Do not eat any solid food or dairy products within 8 hours of your appointment.  2. You may drink clear liquids up to 3 hours before appointment.  Clear liquids include water, black coffee, juice or soda.  No milk or cream please. 3. You may take your regular medication, including pain medications, with a sip of water before your appointment  Diabetics should hold regular insulin (if taken separately) and take 1/2 normal NPH dos the morning of the procedure.  Carry some sugar containing items with you to your appointment. 4. A driver must accompany you and be prepared to drive you home after your procedure.  5. Bring all your current medications with your. 6. An IV may be inserted and sedation may be given at the discretion of the physician.   7. A blood pressure cuff, EKG and other monitors will often be applied during the procedure.  Some patients may need to have extra oxygen administered for a short period. 8. You will be asked to provide medical information, including your allergies, prior to the procedure.  We must know immediately if you are taking blood thinners (like Coumadin/Warfarin)  Or if you are allergic to IV iodine contrast (dye). We must know if you could possible be pregnant.  Possible side-effects:  Bleeding from needle site  Infection (rare, may require surgery)  Nerve injury (rare)  Numbness & tingling (temporary)  Difficulty urinating (rare, temporary)  Spinal headache ( a headache worse with upright posture)  Light -headedness (temporary)  Pain at injection site (several days)  Decreased  blood pressure (temporary)  Weakness in arm/leg (temporary)  Pressure sensation in back/neck (temporary)  Call if you experience:  Fever/chills associated with headache or increased back/neck pain.  Headache worsened by an upright position.  New onset weakness or numbness of an extremity below the injection site  Hives or difficulty breathing (go to the emergency room)  Inflammation or drainage at the infection site  Severe back/neck pain  Any new symptoms which are concerning to you  Please note:  Although the local anesthetic injected can often make your back or neck feel good for several hours after the injection, the pain will likely return.  It takes 3-7 days for steroids to work in the epidural space.  You may not notice any pain relief for at least that one week.  If effective, we will often do a series of three injections spaced 3-6 weeks apart to maximally decrease your pain.  After the initial series, we generally will wait several months before considering a repeat injection of the same type.  If you have any questions, please call [phone] [facility]

## 2020-09-24 NOTE — Progress Notes (Signed)
PROVIDER NOTE: Information contained herein reflects review and annotations entered in association with encounter. Interpretation of such information and data should be left to medically-trained personnel. Information provided to patient can be located elsewhere in the medical record under "Patient Instructions". Document created using STT-dictation technology, any transcriptional errors that may result from process are unintentional.    Patient: Lynn Potter  Service Category: Procedure  Provider: Gaspar Cola, MD  DOB: 1946-02-19  DOS: 09/25/2020  Location: [facility]  MRN: [id number]  Setting: Ambulatory - outpatient  Referring Provider: Remi Haggard, FNP  Type: Established Patient  Specialty: Interventional Pain Management  PCP: Remi Haggard, FNP   Primary Reason for Visit: Interventional Pain Management Treatment. CC: Back Pain (Left, lower)  Procedure #1:  Anesthesia, Analgesia, Anxiolysis:  Type: Therapeutic Trans-Foraminal Epidural Steroid Injection   #3  Region: Lumbar Level: L3 Paravertebral Laterality: Left Paravertebral   Type: Moderate (Conscious) Sedation combined with Local Anesthesia Indication(s): Analgesia and Anxiety Route: Intravenous (IV) IV Access: Secured Sedation: Meaningful verbal contact was maintained at all times during the procedure  Local Anesthetic: Lidocaine 1-2%  Position: Prone  Procedure #2:    Type: Therapeutic Inter-Laminar Epidural Steroid Injection   #3  Region: Lumbar Level: L2-3 Level. Laterality: Right Paramedial     Indications: 1. Chronic low back pain (1ry area of Pain) (Bilateral) (L>R) w/ sciatica (Left)   2. DDD (degenerative disc disease), lumbar   3. Chronic lower extremity pain (2ry area of Pain) (Left)   4. Chronic low back pain (1ry area of Pain) (Bilateral) (L>R) w/o sciatica   5. 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 Chronic bilateral low back pain with left-sided sciatica   8. Chronic pain of left lower extremity   9. Chronic bilateral low back pain without sciatica   10. Anterolisthesis of lumbar spine   11. Spinal stenosis, lumbar region, with neurogenic claudication    Pain Score: Pre-procedure: 10-Worst pain ever/10 Post-procedure: 0-No pain/10   Pre-op H&P Assessment:  Lynn Potter is a 74 y.o. (year old), female patient, seen today for interventional treatment. She  has a past surgical history that includes Total knee arthroplasty; Abdominal hysterectomy; Cholecystectomy; Colonoscopy with esophagogastroduodenoscopy (egd); carpal tunnell; Eye surgery; Cataract extraction; Back surgery; Lumbar fusion; Upper esophageal endoscopic ultrasound (eus) (N/A, 01/03/2016); Esophagogastroduodenoscopy (egd) with propofol (N/A, 02/16/2017); Carpal tunnel release; REPAIR ROTATOR CUFF TEAR; Joint replacement (Bilateral, 1999); Esophagogastroduodenoscopy (egd) with propofol (N/A, 08/03/2019); and Colonoscopy with propofol (N/A, 08/03/2019). Lynn Potter has a current medication list which includes the following prescription(s): amlodipine, vitamin c, aspirin, atorvastatin, cholecalciferol, combivent respimat, ferrous sulfate, folic acid, hydroxyzine, insulin lispro, jardiance, latanoprost, levemir flextouch, metformin, olmesartan-hydrochlorothiazide, fish oil, pantoprazole, pregabalin, ropinirole, trazodone, cyanocobalamin, and atorvastatin, and the following Facility-Administered Medications: fentanyl and midazolam. Her primarily concern today is the Back Pain (Left, lower)  Initial Vital Signs:  Pulse/HCG Rate: (!) 8ECG Heart Rate: 88 Temp: 97.6 F (36.4 C) Resp: 16 BP: 137/90 SpO2: 100 %  BMI: Estimated body mass index is 31.45 kg/m as calculated from the following:   Height as of this encounter: 5\' 5"  (1.651 m).   Weight as of this encounter: 189 lb (85.7 kg).  Risk Assessment: Allergies:  Reviewed. She has No Known Allergies.  Allergy Precautions: None required Coagulopathies: Reviewed. None identified.  Blood-thinner therapy: None at this time Active Infection(s): Reviewed. None identified. Lynn Potter is afebrile  Site Confirmation: Lynn Potter was asked to confirm the procedure and laterality before marking the site Procedure checklist: Completed Consent: Before the procedure and under the influence of no sedative(s), amnesic(s), or anxiolytics, the patient was informed of the treatment options, risks and possible complications. To fulfill our ethical and legal obligations, as recommended by the American Medical Association's Code of Ethics, I have informed the patient of my clinical impression; the nature and purpose of the treatment or procedure; the risks, benefits, and possible complications of the intervention; the alternatives, including doing nothing; the risk(s) and benefit(s) of the alternative treatment(s) or procedure(s); and the risk(s) and benefit(s) of doing nothing. The patient was provided information about the general risks and possible complications associated with the procedure. These may include, but are not limited to: failure to achieve desired goals, infection, bleeding, organ or nerve damage, allergic reactions, paralysis, and death. In addition, the patient was informed of those risks and complications associated to Spine-related procedures, such as failure to decrease pain; infection (i.e.: Meningitis, epidural or intraspinal abscess); bleeding (i.e.: epidural hematoma, subarachnoid hemorrhage, or any other type of intraspinal or peri-dural bleeding); organ or nerve damage (i.e.: Any type of peripheral nerve, nerve root, or spinal cord injury) with subsequent damage to sensory, motor, and/or autonomic systems, resulting in permanent pain, numbness, and/or weakness of one or several areas of the body; allergic reactions; (i.e.: anaphylactic reaction); and/or  death. Furthermore, the patient was informed of those risks and complications associated with the medications. These include, but are not limited to: allergic reactions (i.e.: anaphylactic or anaphylactoid reaction(s)); adrenal axis suppression; blood sugar elevation that in diabetics may result in ketoacidosis or comma; water retention that in patients with history of congestive heart failure may result in shortness of breath, pulmonary edema, and decompensation with resultant heart failure; weight gain; swelling or edema; medication-induced neural toxicity; particulate matter embolism and blood vessel occlusion with resultant organ, and/or nervous system infarction; and/or aseptic necrosis of one or more joints. Finally, the patient was informed that Medicine is not an exact science; therefore, there is also the possibility of unforeseen or unpredictable risks and/or possible complications that may result in a catastrophic outcome. The patient indicated having understood very clearly. We have given the patient no guarantees and we have made no promises. Enough time was given to the patient to ask questions, all of which were answered to the patient's satisfaction. Ms. Ritsema has indicated that she wanted to continue with the procedure. Attestation: I, the ordering provider, attest that I have discussed with the patient the benefits, risks, side-effects, alternatives, likelihood of achieving goals, and potential problems during recovery for the procedure that I have provided informed consent. Date  Time: 09/25/2020  8:18 AM  Pre-Procedure Preparation:  Monitoring: As per clinic protocol. Respiration, ETCO2, SpO2, BP, heart rate and rhythm monitor placed and checked for adequate function Safety Precautions: Patient was assessed for positional comfort and pressure points before starting the procedure. Time-out: I initiated and conducted the "Time-out" before starting the procedure, as per protocol. The  patient was asked to participate by confirming the accuracy of the "Time Out" information. Verification of the correct person, site, and procedure were performed and confirmed by me, the nursing staff, and the patient. "Time-out" conducted as per Joint Commission's Universal Protocol (UP.01.01.01). Time: 0904  Description of Procedure #1:  Target Area: The inferior and lateral portion of the pedicle, just lateral to a line created by the 6:00 position of the pedicle and the superior  articular process of the vertebral body below. On the lateral view, this target lies just posterior to the anterior aspect of the lamina and posterior to the midpoint created between the anterior and the posterior aspect of the neural foramina. Approach: Posterior paravertebral approach. Area Prepped: Entire Posterior Lumbosacral Region DuraPrep (Iodine Povacrylex [0.7% available iodine] and Isopropyl Alcohol, 74% w/w) Safety Precautions: Aspiration looking for blood return was conducted prior to all injections. At no point did we inject any substances, as a needle was being advanced. No attempts were made at seeking any paresthesias. Safe injection practices and needle disposal techniques used. Medications properly checked for expiration dates. SDV (single dose vial) medications used.  Description of the Procedure: Protocol guidelines were followed. The patient was placed in position over the fluoroscopy table. The target area was identified and the area prepped in the usual manner. Skin & deeper tissues infiltrated with local anesthetic. Appropriate amount of time allowed to pass for local anesthetics to take effect. The procedure needles were then advanced to the target area. Proper needle placement secured. Negative aspiration confirmed. Solution injected in intermittent fashion, asking for systemic symptoms every 0.2cc of injectate. The needles were then removed and the area cleansed, making sure to leave some of the  prepping solution back to take advantage of its long term bactericidal properties.  Start Time: 0904 hrs.  Materials:  Needle(s) Type: Spinal Needle Gauge: 22G Length: 3.5-in Medication(s): Please see orders for medications and dosing details.  Description of Procedure #2:  Target Area: The  interlaminar space, initially targeting the lower border of the superior vertebral body lamina. Approach: Posterior paramedial approach. Area Prepped: Same as above Prepping solution: Same as above Safety Precautions: Same as above  Description of the Procedure: Protocol guidelines were followed. The patient was placed in position over the fluoroscopy table. The target area was identified and the area prepped in the usual manner. Skin & deeper tissues infiltrated with local anesthetic. Appropriate amount of time allowed to pass for local anesthetics to take effect. The procedure needle was introduced through the skin, ipsilateral to the reported pain, and advanced to the target area. Bone was contacted and the needle walked caudad, until the lamina was cleared. The ligamentum flavum was engaged and loss-of-resistance technique used as the epidural needle was advanced. The epidural space was identified using "loss-of-resistance technique" with 2-3 ml of PF-NaCl (0.9% NSS), in a 5cc LOR glass syringe. Proper needle placement secured. Negative aspiration confirmed. Solution injected in intermittent fashion, asking for systemic symptoms every 0.5cc of injectate. The needles were then removed and the area cleansed, making sure to leave some of the prepping solution back to take advantage of its long term bactericidal properties.  Vitals:   09/25/20 0913 09/25/20 0923 09/25/20 0933 09/25/20 0943  BP: (!) 133/96 123/63 125/67 126/68  Pulse:      Resp: 18 17 18 17   Temp:  (!) 97.1 F (36.2 C)  (!) 97.2 F (36.2 C)  TempSrc:      SpO2: 98% 98% 98% 98%  Weight:      Height:        End Time: 0907  hrs.  Materials:  Needle(s) Type: Epidural needle Gauge: 17G Length: 3.5-in Medication(s): Please see orders for medications and dosing details.  Imaging Guidance (Spinal):          Type of Imaging Technique: Fluoroscopy Guidance (Spinal) Indication(s): Assistance in needle guidance and placement for procedures requiring needle placement in or near specific anatomical locations not  easily accessible without such assistance. Exposure Time: Please see nurses notes. Contrast: Before injecting any contrast, we confirmed that the patient did not have an allergy to iodine, shellfish, or radiological contrast. Once satisfactory needle placement was completed at the desired level, radiological contrast was injected. Contrast injected under live fluoroscopy. No contrast complications. See chart for type and volume of contrast used. Fluoroscopic Guidance: I was personally present during the use of fluoroscopy. "Tunnel Vision Technique" used to obtain the best possible view of the target area. Parallax error corrected before commencing the procedure. "Direction-depth-direction" technique used to introduce the needle under continuous pulsed fluoroscopy. Once target was reached, antero-posterior, oblique, and lateral fluoroscopic projection used confirm needle placement in all planes. Images permanently stored in EMR. Interpretation: I personally interpreted the imaging intraoperatively. Adequate needle placement confirmed in multiple planes. Appropriate spread of contrast into desired area was observed. No evidence of afferent or efferent intravascular uptake. No intrathecal or subarachnoid spread observed. Permanent images saved into the patient's record.  Antibiotic Prophylaxis:   Anti-infectives (From admission, onward)   None     Indication(s): None identified  Post-operative Assessment:  Post-procedure Vital Signs:  Pulse/HCG Rate: (!) 896 Temp: (!) 97.2 F (36.2 C) Resp: 17 BP: 126/68 SpO2:  98 %  EBL: None  Complications: No immediate post-treatment complications observed by team, or reported by patient.  Note: The patient tolerated the entire procedure well. A repeat set of vitals were taken after the procedure and the patient was kept under observation following institutional policy, for this type of procedure. Post-procedural neurological assessment was performed, showing return to baseline, prior to discharge. The patient was provided with post-procedure discharge instructions, including a section on how to identify potential problems. Should any problems arise concerning this procedure, the patient was given instructions to immediately contact us, at any time, without hesitation. In any case, we plan to contact the patient by telephone for a follow-up status report regarding this interventional procedure.  Comments:  No additional relevant information.  Plan of Care  Orders:  Orders Placed This Encounter  Procedures  . Lumbar Epidural Injection    Scheduling Instructions:     Procedure: Interlaminar LESI L2-3     Laterality: Right-sided     Sedation: Patient's choice     Timeframe:  Today    Order Specific Question:   Where will this procedure be performed?    Answer:   [facility]  . Lumbar Transforaminal Epidural    Scheduling Instructions:     Side: Left-sided     Level: L3     Sedation: Patient's choice.     Timeframe: Today    Order Specific Question:   Where will this procedure be performed?    Answer:   [facility]  . DG PA[facility] C-ARM 1-60 MIN NO REPORT    Intraoperative interpretation by procedural physician at [facility].    Standing Status:   Standing    Number of Occurrences:   1    Order Specific Question:   Reason for exam:    Answer:   Assistance in needle guidance and placement for procedures requiring needle placement in or near specific anatomical locations not easily accessible without such assistance.  .  Informed Consent Details: Physician/Practitioner Attestation; Transcribe to consent form and obtain patient signature    Note: Always confirm laterality of pain with Ms. Cochrane, before procedure. Transcribe to consent form and obtain patient signature.    Order Specific Question:  Physician/Practitioner attestation of informed consent for procedure/surgical case    Answer:   I, the physician/practitioner, attest that I have discussed with the patient the benefits, risks, side effects, alternatives, likelihood of achieving goals and potential problems during recovery for the procedure that I have provided informed consent.    Order Specific Question:   Procedure    Answer:   Lumbar epidural steroid injection under fluoroscopic guidance    Order Specific Question:   Physician/Practitioner performing the procedure    Answer:   Jaysha Lasure A. Dossie Arbour, MD    Order Specific Question:   Indication/Reason    Answer:   Low back and/or lower extremity pain secondary to lumbar radiculitis  . Informed Consent Details: Physician/Practitioner Attestation; Transcribe to consent form and obtain patient signature    Provider Attestation: I, [place] Dossie Arbour, MD, (Pain Management Specialist), the physician/practitioner, attest that I have discussed with the patient the benefits, risks, side effects, alternatives, likelihood of achieving goals and potential problems during recovery for the procedure that I have provided informed consent.    Scheduling Instructions:     Note: Always confirm laterality of pain with Ms. Morath, before procedure.     Transcribe to consent form and obtain patient signature.    Order Specific Question:   Physician/Practitioner attestation of informed consent for procedure/surgical case    Answer:   I, the physician/practitioner, attest that I have discussed with the patient the benefits, risks, side effects, alternatives, likelihood of achieving goals and potential problems during  recovery for the procedure that I have provided informed consent.    Order Specific Question:   Procedure    Answer:   Diagnostic lumbar transforaminal epidural steroid injection under fluoroscopic guidance. (See notes for level and laterality.)    Order Specific Question:   Physician/Practitioner performing the procedure    Answer:   Cobin Cadavid A. 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    Medications ordered for procedure: Meds ordered this encounter  Medications  . iohexol (OMNIPAQUE) 180 MG/ML injection 10 mL    Must be Myelogram-compatible. If not available, you may substitute with a water-soluble, non-ionic, hypoallergenic, myelogram-compatible radiological contrast medium.  Marland Kitchen lidocaine (XYLOCAINE) 2 % (with pres) injection 400 mg  . lactated ringers infusion 1,000 mL  . midazolam (VERSED) 5 MG/5ML injection 1-2 mg    Make sure Flumazenil is available in the pyxis when using this medication. If oversedation occurs, administer 0.2 mg IV over 15 sec. If after 45 sec no response, administer 0.2 mg again over 1 min; may repeat at 1 min intervals; not to exceed 4 doses (1 mg)  . fentaNYL (SUBLIMAZE) injection 25-50 mcg    Make sure Narcan is available in the pyxis when using this medication. In the event of respiratory depression (RR< 8/min): Titrate NARCAN (naloxone) in increments of 0.1 to 0.2 mg IV at 2-3 minute intervals, until desired degree of reversal.  . sodium chloride flush (NS) 0.9 % injection 2 mL  . ropivacaine (PF) 2 mg/mL (0.2%) (NAROPIN) injection 2 mL  . triamcinolone acetonide (KENALOG-40) injection 40 mg  . sodium chloride flush (NS) 0.9 % injection 1 mL  . ropivacaine (PF) 2 mg/mL (0.2%) (NAROPIN) injection 1 mL  . dexamethasone (DECADRON) injection 10 mg   Medications administered: We administered  iohexol, lidocaine, lactated ringers, midazolam, fentaNYL, sodium chloride  flush, ropivacaine (PF) 2 mg/mL (0.2%), triamcinolone acetonide, sodium chloride flush, ropivacaine (PF) 2 mg/mL (0.2%), and dexamethasone.  See the medical record for exact dosing, route, and time of administration.  Follow-up plan:   Return in about 2 weeks (around 10/09/2020) for (F2F), (PP) Follow-up.       Interventional management options: Considering: Diagnosticright L3-4LESI Diagnostic bilateral L3 transforaminal LESI Diagnostic bilateral lumbar facet block#2 Possible bilateral lumbar facetRFA Diagnostic bilateralsuprascapularnerve block Possible bilateral suprascapularRFA Diagnostic bilateral T11 transforaminal TESI   Palliative PRN treatment(s): Palliative bilateral lumbar facet block #2 Diagnostic/therapeutic left L3 TFESI #2/S2 + right L2-3 LESI #2/S2 (100/100/100/100)        Recent Visits Date Type Provider Dept  09/17/20 Office Visit Milinda Pointer, MD [facility]  08/21/20 Procedure visit Milinda Pointer, MD [facility]  08/08/20 Office Visit Milinda Pointer, MD [facility]  07/26/20 Procedure visit Milinda Pointer, MD [facility]  07/17/20 Office Visit Milinda Pointer, MD [facility]  Showing recent visits within past 90 days and meeting all other requirements Today's Visits Date Type Provider Dept  09/25/20 Procedure visit Milinda Pointer, MD [facility]  Showing today's visits and meeting all other requirements Future Appointments Date Type Provider Dept  10/10/20 Appointment Milinda Pointer, MD [facility]  Showing future appointments within next 90 days and meeting all other requirements  Disposition: Discharge home  Discharge (Date  Time): 09/25/2020; 0952 hrs.   Primary Care Physician: Remi Haggard, FNP Location: [facility] Note by:  Gaspar Cola, MD Date: 09/25/2020; Time: 10:18 AM  Disclaimer:  Medicine is not an Chief Strategy Officer. The only guarantee in medicine is that nothing is guaranteed. It is important to note that the decision to proceed with this intervention was based on the information collected from the patient. The Data and conclusions were drawn from the patient's questionnaire, the interview, and the physical examination. Because the information was provided in large part by the patient, it cannot be guaranteed that it has not been purposely or unconsciously manipulated. Every effort has been made to obtain as much relevant data as possible for this evaluation. It is important to note that the conclusions that lead to this procedure are derived in large part from the available data. Always take into account that the treatment will also be dependent on availability of resources and existing treatment guidelines, considered by other Pain Management Practitioners as being common knowledge and practice, at the time of the intervention. For Medico-Legal purposes, it is also important to point out that variation in procedural techniques and pharmacological choices are the acceptable norm. The indications, contraindications, technique, and results of the above procedure should only be interpreted and judged by a Board-Certified Interventional Pain Specialist with extensive familiarity and expertise in the same exact procedure and technique.

## 2020-09-25 ENCOUNTER — Other Ambulatory Visit: Payer: Self-pay

## 2020-09-25 ENCOUNTER — Ambulatory Visit
Admission: RE | Admit: 2020-09-25 | Discharge: 2020-09-25 | Disposition: A | Discharge status: Home / Self Care | Payer: Medicare Other | Source: Ambulatory Visit | Attending: Pain Medicine | Admitting: Pain Medicine

## 2020-09-25 ENCOUNTER — Ambulatory Visit (HOSPITAL_BASED_OUTPATIENT_CLINIC_OR_DEPARTMENT_OTHER): Payer: Medicare Other | Admitting: Pain Medicine

## 2020-09-25 ENCOUNTER — Encounter: Payer: Self-pay | Admitting: Pain Medicine

## 2020-09-25 VITALS — BP 126/68 | HR 8 | Temp 97.2°F | Resp 17 | Ht 65.0 in | Wt 189.0 lb

## 2020-09-25 DIAGNOSIS — M545 Low back pain, unspecified: Secondary | ICD-10-CM | POA: Insufficient documentation

## 2020-09-25 DIAGNOSIS — M4316 Spondylolisthesis, lumbar region: Secondary | ICD-10-CM | POA: Insufficient documentation

## 2020-09-25 DIAGNOSIS — M5136 Other intervertebral disc degeneration, lumbar region: Secondary | ICD-10-CM | POA: Insufficient documentation

## 2020-09-25 DIAGNOSIS — M48062 Spinal stenosis, lumbar region with neurogenic claudication: Secondary | ICD-10-CM | POA: Diagnosis present

## 2020-09-25 DIAGNOSIS — M5442 Lumbago with sciatica, left side: Secondary | ICD-10-CM | POA: Insufficient documentation

## 2020-09-25 DIAGNOSIS — G8929 Other chronic pain: Secondary | ICD-10-CM | POA: Insufficient documentation

## 2020-09-25 DIAGNOSIS — M79605 Pain in left leg: Secondary | ICD-10-CM | POA: Diagnosis present

## 2020-09-25 MED ORDER — LIDOCAINE HCL 2 % IJ SOLN
INTRAMUSCULAR | Status: AC
  Filled 2020-09-25: qty 20

## 2020-09-25 MED ORDER — TRIAMCINOLONE ACETONIDE 40 MG/ML IJ SUSP
INTRAMUSCULAR | Status: AC
  Filled 2020-09-25: qty 1

## 2020-09-25 MED ORDER — MIDAZOLAM HCL 5 MG/5ML IJ SOLN
INTRAMUSCULAR | Status: AC
  Filled 2020-09-25: qty 5

## 2020-09-25 MED ORDER — MIDAZOLAM HCL 5 MG/5ML IJ SOLN
1.0000 mg | INTRAMUSCULAR | Status: DC | PRN
  Administered 2020-09-25: 09:00:00 2 mg via INTRAVENOUS

## 2020-09-25 MED ORDER — DEXAMETHASONE SODIUM PHOSPHATE 10 MG/ML IJ SOLN
10.0000 mg | Freq: Once | INTRAMUSCULAR | Status: AC
  Administered 2020-09-25: 09:00:00 10 mg

## 2020-09-25 MED ORDER — LACTATED RINGERS IV SOLN
1000.0000 mL | Freq: Once | INTRAVENOUS | Status: AC
  Administered 2020-09-25: 09:00:00 1000 mL via INTRAVENOUS

## 2020-09-25 MED ORDER — SODIUM CHLORIDE (PF) 0.9 % IJ SOLN
INTRAMUSCULAR | Status: AC
  Filled 2020-09-25: qty 20

## 2020-09-25 MED ORDER — ROPIVACAINE HCL 2 MG/ML IJ SOLN
INTRAMUSCULAR | Status: AC
  Filled 2020-09-25: qty 10

## 2020-09-25 MED ORDER — ROPIVACAINE HCL 2 MG/ML IJ SOLN
2.0000 mL | Freq: Once | INTRAMUSCULAR | Status: AC
  Administered 2020-09-25: 09:00:00 2 mL via EPIDURAL

## 2020-09-25 MED ORDER — TRIAMCINOLONE ACETONIDE 40 MG/ML IJ SUSP
40.0000 mg | Freq: Once | INTRAMUSCULAR | Status: AC
  Administered 2020-09-25: 09:00:00 40 mg

## 2020-09-25 MED ORDER — SODIUM CHLORIDE 0.9% FLUSH
1.0000 mL | Freq: Once | INTRAVENOUS | Status: AC
  Administered 2020-09-25: 09:00:00 1 mL

## 2020-09-25 MED ORDER — SODIUM CHLORIDE 0.9% FLUSH
2.0000 mL | Freq: Once | INTRAVENOUS | Status: AC
  Administered 2020-09-25: 09:00:00 2 mL

## 2020-09-25 MED ORDER — DEXAMETHASONE SODIUM PHOSPHATE 10 MG/ML IJ SOLN
INTRAMUSCULAR | Status: AC
  Filled 2020-09-25: qty 1

## 2020-09-25 MED ORDER — LIDOCAINE HCL 2 % IJ SOLN
20.0000 mL | Freq: Once | INTRAMUSCULAR | Status: AC
  Administered 2020-09-25: 09:00:00 400 mg

## 2020-09-25 MED ORDER — IOHEXOL 180 MG/ML  SOLN
10.0000 mL | Freq: Once | INTRAMUSCULAR | Status: AC
  Administered 2020-09-25: 09:00:00 10 mL via EPIDURAL

## 2020-09-25 MED ORDER — ROPIVACAINE HCL 2 MG/ML IJ SOLN
1.0000 mL | Freq: Once | INTRAMUSCULAR | Status: AC
  Administered 2020-09-25: 09:00:00 1 mL via EPIDURAL

## 2020-09-25 MED ORDER — FENTANYL CITRATE (PF) 100 MCG/2ML IJ SOLN
INTRAMUSCULAR | Status: AC
  Filled 2020-09-25: qty 2

## 2020-09-25 MED ORDER — FENTANYL CITRATE (PF) 100 MCG/2ML IJ SOLN
25.0000 ug | INTRAMUSCULAR | Status: DC | PRN
  Administered 2020-09-25: 50 ug via INTRAVENOUS

## 2020-09-25 NOTE — Progress Notes (Signed)
Safety precautions to be maintained throughout the outpatient stay will include: orient to surroundings, keep bed in low position, maintain call bell within reach at all times, provide assistance with transfer out of bed and ambulation.  

## 2020-09-25 NOTE — Patient Instructions (Signed)

## 2020-09-26 ENCOUNTER — Telehealth: Payer: Self-pay

## 2020-09-26 NOTE — Telephone Encounter (Signed)
Pt was called and no problems reported. 

## 2020-10-01 ENCOUNTER — Ambulatory Visit
Admission: RE | Admit: 2020-10-01 | Discharge: 2020-10-01 | Disposition: A | Discharge status: Home / Self Care | Payer: Medicare Other | Source: Ambulatory Visit | Attending: Gastroenterology | Admitting: Gastroenterology

## 2020-10-01 ENCOUNTER — Other Ambulatory Visit: Payer: Self-pay

## 2020-10-01 DIAGNOSIS — K2100 Gastro-esophageal reflux disease with esophagitis, without bleeding: Secondary | ICD-10-CM | POA: Diagnosis present

## 2020-10-01 DIAGNOSIS — Z8719 Personal history of other diseases of the digestive system: Secondary | ICD-10-CM | POA: Insufficient documentation

## 2020-10-01 DIAGNOSIS — R1319 Other dysphagia: Secondary | ICD-10-CM | POA: Diagnosis present

## 2020-10-09 NOTE — Progress Notes (Signed)
PROVIDER NOTE: Information contained herein reflects review and annotations entered in association with encounter. Interpretation of such information and data should be left to medically-trained personnel. Information provided to patient can be located elsewhere in the medical record under "Patient Instructions". Document created using STT-dictation technology, any transcriptional errors that may result from process are unintentional.    Patient: Lynn Potter  Service Category: E/M  Provider: Gaspar Cola, MD  DOB: Nov 22, 1945  DOS: 10/10/2020  Specialty: Interventional Pain Management  MRN: [id number]  Setting: Ambulatory outpatient  PCP: Remi Haggard, FNP  Type: Established Patient    Referring Provider: Remi Haggard, FNP  Location: Office  Delivery: Face-to-face     HPI  [name], a 75 y.o. year old female, is here today because of her Chronic pain syndrome [G89.4]. Ms. Duran primary complain today is Back Pain (Lumbar bilateral left is worse than right ) and Headache (Sharp pains all over her head) Last encounter: My last encounter with her was on 09/25/2020. Pertinent problems: Ms. Moylan has Osteoarthritis of shoulder (Bilateral); Osteoarthritis of shoulder (Right); Rotator cuff tendinitis; History of rotator cuff surgery (Bilateral); Chronic pain syndrome; Degenerative joint disease (DJD) of hip; Sacroiliac joint pain; Chronic low back pain (1ry area of Pain) (Bilateral) (L>R) w/ sciatica (Left); Chronic lower extremity pain (2ry area of Pain) (Left); Chronic shoulder pain (3ry area of Pain) (Bilateral) (R>L); Chronic hip pain (Left); Osteoarthritis of hip (Left); Lumbar facet syndrome (Bilateral) (L>R); Spondylosis without myelopathy or radiculopathy, lumbosacral region; Chronic low back pain (1ry area of Pain) (Bilateral) (L>R) w/o sciatica; Abnormal CT of  lumbar spine (10/11/2018); Lumbar facet hypertrophy (Multilevel); DDD (degenerative disc disease), lumbar; Failed back  surgical syndrome; 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 Pain Assessment: Severity of Chronic pain is reported as a 8 /10. Location: Back (headaches) Lower,Left,Right/into hips and legs more so on the left side all the way to the foot. Onset: More than a month ago. Quality: Discomfort,Constant,Aching,Throbbing,Sharp (unbearable to deal with). Timing: Constant. Modifying factor(s): nothing currently.. Vitals:  height is _0  (1.651 m) and weight is 191 lb (86.6 kg). Her temporal temperature is 97.7 F (36.5 C). Her blood pressure is 131/87 and her pulse is 76. Her respiration is 16 and oxygen saturation is 100%.   Reason for encounter: post-procedure assessment.  Today I have gone over the results of her recent left L3 transforaminal epidural steroid injection #3 + right L2-3 interlaminar lumbar epidural steroid injection #3 under fluoroscopic guidance and IV sedation.  Again the patient continues to get 100% relief of the pain that will last anywhere from 5 to 30 days.  Unfortunately, the pain continues to return.  Today I went over the results of her MRI and I have explained to the patient that due to the severe L3-4 central spinal stenosis and subarticular/foraminal stenosis, I believe that she would benefit from decompressive surgery at that level.  I have asked the patient if she has any preference as to where she wants [facility] [place] her for that evaluation and she has indicated that she would prefer to stay in this area.  Therefore[facility]  The patient does have a history of prior back surgeries in 1989  and 2002.  According to the electronic medical record the  patient has previously been seen by Dr. Lacinda Axon and Dr. Cari Caraway.  Post-Procedure Evaluation  Procedure (09/25/2020): Therapeutic left L3 TFESI #3 + right L2-3 LESI #3 under fluoroscopic guidance and IV sedation Pre-procedure pain level: 10/10 Post-procedure: 0/10 (100% relief)  Sedation: Sedation provided.  Effectiveness during initial hour after procedure(Ultra-Short Term Relief): 100 %.  Local anesthetic used: Long-acting (4-6 hours) Effectiveness: Defined as any analgesic benefit obtained secondary to the administration of local anesthetics. This carries significant diagnostic value as to the etiological location, or anatomical origin, of the pain. Duration of benefit is expected to coincide with the duration of the local anesthetic used.  Effectiveness during initial 4-6 hours after procedure(Short-Term Relief): 100 %.  Long-term benefit: Defined as any relief past the pharmacologic duration of the local anesthetics.  Effectiveness past the initial 6 hours after procedure(Long-Term Relief): 100 % (had great pain relief for 4 - 5 days and then the pain came back all at once.).  Current benefits: Defined as benefit that persist at this time.   Analgesia:  Back to baseline Function: Back to baseline ROM: Back to baseline  Pharmacotherapy Assessment   Analgesic: No opioid analgesics from our practice.    Monitoring: [place] PMP: PDMP reviewed during this encounter.       Pharmacotherapy: No side-effects or adverse reactions reported. Compliance: No problems identified. Effectiveness: Clinically acceptable.  No notes on file  UDS:  Summary  Date Value Ref Range Status  11/09/2018 FINAL  Final    Comment:    ==================================================================== [facility] COMP DRUG ANALYSIS,UR ==================================================================== Test                             Result       Flag       Units Drug Present and Declared for Prescription  Verification   Pregabalin                     PRESENT      EXPECTED   Baclofen                       PRESENT      EXPECTED   Methocarbamol                  PRESENT      EXPECTED   Hydroxyzine                    PRESENT      EXPECTED Drug Absent but Declared for Prescription Verification   Salicylate                     Not Detected UNEXPECTED    Aspirin, as indicated in the declared medication list, is not    always detected even when used as directed. ==================================================================== Test                      Result    Flag   Units      Ref Range   Creatinine              58               mg/dL      >=20 ==================================================================== Declared Medications:  The flagging and interpretation on this report are based on the  following declared medications.  Unexpected  results may arise from  inaccuracies in the declared medications.  **Note: The testing scope of this panel includes these medications:  Baclofen  Hydroxyzine  Methocarbamol  Pregabalin (Lyrica)  **Note: The testing scope of this panel does not include small to  moderate amounts of these reported medications:  Aspirin (Aspirin 81)  **Note: The testing scope of this panel does not include following  reported medications:  Amlodipine  Ascorbic Acid  Atorvastatin  Azelastine  Benazepril  Cholecalciferol  Empagliflozin (Jardiance)  Fluconazole (Diflucan)  Insulin  Iron (Ferrous Sulfate)  Latanoprost  Metformin  Olmesartan (Benicar)  Pantoprazole  Ropinirole (Requip) ==================================================================== For clinical consultation, please call [phone]. ====================================================================      ROS  Constitutional: Denies any fever or chills Gastrointestinal: No reported hemesis, hematochezia, vomiting, or acute GI distress Musculoskeletal: Denies any acute onset joint  swelling, redness, loss of ROM, or weakness Neurological: No reported episodes of acute onset apraxia, aphasia, dysarthria, agnosia, amnesia, paralysis, loss of coordination, or loss of consciousness  Medication Review  Cholecalciferol, Fish Oil, Ipratropium-Albuterol, amLODipine, aspirin, atorvastatin, cyanocobalamin, empagliflozin, ferrous sulfate, folic acid, hydrOXYzine, insulin detemir, insulin lispro, latanoprost, metFORMIN, olmesartan-hydrochlorothiazide, pantoprazole, pregabalin, rOPINIRole, traZODone, and vitamin C  History Review  Allergy: Ms. Julio has No Known Allergies. Drug: Ms. Golomb  reports no history of drug use. Alcohol:  reports no history of alcohol use. Tobacco:  reports that she has been smoking cigarettes. She has been smoking about 1.00 pack per day. She has never used smokeless tobacco. Social: Ms. Holwerda  reports that she has been smoking cigarettes. She has been smoking about 1.00 pack per day. She has never used smokeless tobacco. She reports that she does not drink alcohol and does not use drugs. Medical:  has a past medical history of AF (atrial fibrillation) ([place]), AKI (acute kidney injury) ([place]) (07/21/2019), Arthritis, Asthma, Chicken pox, Clotting disorder ([place]), Depression, Diabetes mellitus without complication ([place]), Diverticulosis, Dysrhythmia, GERD (gastroesophageal reflux disease), Glaucoma, Hemorrhoids, Hemorrhoids, internal, HH (hiatus hernia), History of hiatal hernia, History of kidney stones, Hypertension, Migraines, Neuropathy, Obesity, Opiate use (03/16/2017), Personal history of tobacco use, presenting hazards to health (06/18/2015), Sleep apnea, and Tubular adenoma of colon. Surgical: Ms. Morreale  has a past surgical history that includes Total knee arthroplasty; Abdominal hysterectomy; Cholecystectomy; Colonoscopy with esophagogastroduodenoscopy (egd); carpal tunnell; Eye surgery; Cataract extraction; Back surgery; Lumbar fusion; Upper esophageal  endoscopic ultrasound (eus) (N/A, 01/03/2016); Esophagogastroduodenoscopy (egd) with propofol (N/A, 02/16/2017); Carpal tunnel release; REPAIR ROTATOR CUFF TEAR; Joint replacement (Bilateral, 1999); Esophagogastroduodenoscopy (egd) with propofol (N/A, 08/03/2019); and Colonoscopy with propofol (N/A, 08/03/2019). Family: family history includes Diabetes in her mother and sister.  Laboratory Chemistry Profile   Renal Lab Results  Component Value Date   BUN 17 08/10/2020   CREATININE 0.87 08/10/2020   BCR 10 (L) 11/09/2018   GFRAA >60 07/23/2019   GFRNONAA >60 08/10/2020     Hepatic Lab Results  Component Value Date   AST 16 08/06/2020   ALT 16 08/06/2020   ALBUMIN 3.9 08/06/2020   ALKPHOS 74 08/06/2020   LIPASE 21 05/23/2018     Electrolytes Lab Results  Component Value Date   NA 137 08/10/2020   K 3.4 (L) 08/10/2020   CL 100 08/10/2020   CALCIUM 9.9 08/10/2020   MG 2.1 07/22/2019     Bone Lab Results  Component Value Date   25OHVITD1 33 11/29/2018   25OHVITD2 2.5 11/29/2018   25OHVITD3 30 11/29/2018     Inflammation (CRP: Acute Phase) (ESR: Chronic Phase) Lab  Results  Component Value Date   CRP 16 (H) 11/09/2018   ESRSEDRATE 77 (H) 11/09/2018   LATICACIDVEN 1.9 07/21/2019       Note: Above Lab results reviewed.  Recent Imaging Review  DG ESOPHAGUS W DOUBLE CM (HD) CLINICAL DATA:  Dysphagia, trouble swallowing  EXAM: ESOPHOGRAM / BARIUM SWALLOW / BARIUM TABLET STUDY  TECHNIQUE: Combined double contrast and single contrast examination performed using effervescent crystals, thick barium liquid, and thin barium liquid. The patient was observed with fluoroscopy swallowing a 13 mm barium sulphate tablet.  FLUOROSCOPY TIME:  Fluoroscopy Time:  0.6 minute  Radiation Exposure Index (if provided by the fluoroscopic device): 4 mGy  Number of Acquired Spot Images: 0  COMPARISON:  None.  FINDINGS: Normal pharyngeal anatomy and motility. Contrast flowed  freely through the esophagus without evidence of a stricture or mass. Normal esophageal mucosa without evidence of irregularity or ulceration. Esophageal motility was normal. Mild gastroesophageal reflux. Small hiatal hernia.  At the end of the examination a 13 mm barium tablet was administered which transited through the esophagus and esophagogastric junction without delay.  IMPRESSION: Mild gastroesophageal reflux.  Small hiatal hernia.  No esophageal stricture.  Electronically Signed   By: Kathreen Devoid   On: 10/01/2020 11:00 Note: Reviewed        Physical Exam  General appearance: Well nourished, well developed, and well hydrated. In no apparent acute distress Mental status: Alert, oriented x 3 (person, place, & time)       Respiratory: No evidence of acute respiratory distress Eyes: PERLA Vitals: BP 131/87 (Cuff Size: Large)    Pulse 76    Temp 97.7 F (36.5 C) (Temporal)    Resp 16    Ht _0  (1.651 m)    Wt 191 lb (86.6 kg)    SpO2 100%    BMI 31.78 kg/m  BMI: Estimated body mass index is 31.78 kg/m as calculated from the following:   Height as of this encounter: _1  (1.651 m).   Weight as of this encounter: 191 lb (86.6 kg). Ideal: Ideal body weight: 57 kg (125 lb 10.6 oz) Adjusted ideal body weight: 68.9 kg (151 lb 12.8 oz)  Assessment   Status Diagnosis  Controlled Controlled Controlled 1. Chronic pain syndrome   2. Chronic low back pain (1ry area of Pain) (Bilateral) (L>R) w/ sciatica (Left)   3. Chronic lower extremity pain (2ry area of Pain) (Left)   4. 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 Grade 1 Anterolisthesis of lumbar spine (19m) (L3-4)   6. Lumbar facet syndrome (Bilateral) (L>R)   7. Failed back surgical syndrome   8. Abnormal MRI, lumbar spine (09/26/18)      Updated Problems: No problems updated.  Plan of Care  Problem-specific:  No problem-specific Assessment & Plan notes found for this  encounter.  Ms. RISA HITZhas a current medication list which includes the following long-term medication(s): atorvastatin, ferrous sulfate, insulin lispro, levemir flextouch, pregabalin, trazodone, and atorvastatin.  Pharmacotherapy (Medications Ordered): No orders of the defined types were placed in this encounter.  Orders:  Orders Placed This Encounter  Procedures   Ambulatory referral to Neurosurgery    Referral Priority:   Routine    Referral Type:   Surgical    Referral Reason:   Specialty Services Required    Requested Specialty:   Neurosurgery    Number of Visits Requested:   1   Follow-up plan:   Return if  symptoms worsen or fail to improve.      Interventional Therapies  Risk   Complexity Considerations:   WNL   Planned   Pending:   Pending further evaluation   Under consideration:   Diagnosticright L3-4LESI Diagnostic bilateral L3 transforaminal LESI Diagnostic bilateral lumbar facet block#2 Possible bilateral lumbar facetRFA Diagnostic bilateralsuprascapularnerve block Possible bilateral suprascapularRFA Diagnostic bilateral T11 transforaminal TESI   Completed:   Therapeutic left L3 TFESI #3 + right L2-3 LESI #3 (09/25/2020) (100/100/100/100)    Therapeutic   Palliative (PRN) options:   Palliative bilateral lumbar facet block #2 Diagnostic/therapeutic left L3 TFESI #2/S2 + right L2-3 LESI #2/S2 (100/100/100/100)     Recent Visits Date Type Provider Dept  09/25/20 Procedure visit Milinda Pointer, MD [facility]  09/17/20 Office Visit Milinda Pointer, MD [facility]  08/21/20 Procedure visit Milinda Pointer, MD [facility]  08/08/20 Office Visit Milinda Pointer, MD [facility]  07/26/20 Procedure visit Milinda Pointer, MD [facility]  07/17/20 Office Visit Milinda Pointer, MD [facility]  Showing recent visits within past 90 days and meeting all other  requirements Today's Visits Date Type Provider Dept  10/10/20 Office Visit Milinda Pointer, MD [facility]  Showing today's visits and meeting all other requirements Future Appointments No visits were found meeting these conditions. Showing future appointments within next 90 days and meeting all other requirements  I discussed the assessment and treatment plan with the patient. The patient was provided an opportunity to ask questions and all were answered. The patient agreed with the plan and demonstrated an understanding of the instructions.  Patient advised to call back or seek an in-person evaluation if the symptoms or condition worsens.  Duration of encounter: 30 minutes.  Note by: Gaspar Cola, MD Date: 10/10/2020; Time: 10:21 AM

## 2020-10-10 ENCOUNTER — Encounter: Payer: Self-pay | Admitting: Pain Medicine

## 2020-10-10 ENCOUNTER — Ambulatory Visit: Payer: Medicare Other | Attending: Pain Medicine | Admitting: Pain Medicine

## 2020-10-10 ENCOUNTER — Other Ambulatory Visit: Payer: Self-pay

## 2020-10-10 VITALS — BP 131/87 | HR 76 | Temp 97.7°F | Resp 16 | Ht 65.0 in | Wt 191.0 lb

## 2020-10-10 DIAGNOSIS — G8929 Other chronic pain: Secondary | ICD-10-CM

## 2020-10-10 DIAGNOSIS — G894 Chronic pain syndrome: Secondary | ICD-10-CM | POA: Diagnosis present

## 2020-10-10 DIAGNOSIS — M5442 Lumbago with sciatica, left side: Secondary | ICD-10-CM | POA: Insufficient documentation

## 2020-10-10 DIAGNOSIS — M48062 Spinal stenosis, lumbar region with neurogenic claudication: Secondary | ICD-10-CM | POA: Insufficient documentation

## 2020-10-10 DIAGNOSIS — M4316 Spondylolisthesis, lumbar region: Secondary | ICD-10-CM | POA: Diagnosis present

## 2020-10-10 DIAGNOSIS — R937 Abnormal findings on diagnostic imaging of other parts of musculoskeletal system: Secondary | ICD-10-CM | POA: Diagnosis present

## 2020-10-10 DIAGNOSIS — M961 Postlaminectomy syndrome, not elsewhere classified: Secondary | ICD-10-CM | POA: Insufficient documentation

## 2020-10-10 DIAGNOSIS — M47816 Spondylosis without myelopathy or radiculopathy, lumbar region: Secondary | ICD-10-CM | POA: Insufficient documentation

## 2020-10-10 DIAGNOSIS — M79605 Pain in left leg: Secondary | ICD-10-CM | POA: Insufficient documentation

## 2020-10-18 DIAGNOSIS — D241 Benign neoplasm of right breast: Secondary | ICD-10-CM

## 2020-10-18 HISTORY — DX: Benign neoplasm of right breast: D24.1

## 2020-10-26 ENCOUNTER — Ambulatory Visit: Payer: Medicare Other | Admitting: Podiatry

## 2020-11-06 ENCOUNTER — Other Ambulatory Visit: Payer: Self-pay

## 2020-11-06 ENCOUNTER — Ambulatory Visit (INDEPENDENT_AMBULATORY_CARE_PROVIDER_SITE_OTHER): Payer: 59 | Admitting: Podiatry

## 2020-11-06 DIAGNOSIS — M79674 Pain in right toe(s): Secondary | ICD-10-CM | POA: Diagnosis not present

## 2020-11-06 DIAGNOSIS — M79675 Pain in left toe(s): Secondary | ICD-10-CM

## 2020-11-06 DIAGNOSIS — B351 Tinea unguium: Secondary | ICD-10-CM

## 2020-11-06 DIAGNOSIS — L989 Disorder of the skin and subcutaneous tissue, unspecified: Secondary | ICD-10-CM

## 2020-11-06 DIAGNOSIS — E0843 Diabetes mellitus due to underlying condition with diabetic autonomic (poly)neuropathy: Secondary | ICD-10-CM

## 2020-11-06 NOTE — Progress Notes (Signed)
   SUBJECTIVE Patient with a history of diabetes mellitus presents to office today complaining of elongated, thickened nails that cause pain while ambulating in shoes.  She is unable to trim her own nails.  Patient states that there is no new complaints at this time and she is here for the routine foot care.  Past Medical History:  Diagnosis Date   AF (atrial fibrillation) (HCC)    AKI (acute kidney injury) (HCC) 07/21/2019   Arthritis    Asthma    Chicken pox    Clotting disorder (HCC)    Depression    Diabetes mellitus without complication (HCC)    Diverticulosis    Dysrhythmia    GERD (gastroesophageal reflux disease)    Glaucoma    Hemorrhoids    Hemorrhoids, internal    HH (hiatus hernia)    History of hiatal hernia    History of kidney stones    Hypertension    Migraines    Neuropathy    Obesity    Opiate use 03/16/2017   Personal history of tobacco use, presenting hazards to health 06/18/2015   Sleep apnea    Tubular adenoma of colon     OBJECTIVE General Patient is awake, alert, and oriented x 3 and in no acute distress. Derm Skin is dry and supple bilateral. Negative open lesions or macerations. Remaining integument unremarkable. Nails are tender, long, thickened and dystrophic with subungual debris, consistent with onychomycosis, 1-5 bilateral. No signs of infection noted.  Hyperkeratotic callus tissue noted to the plantar aspect of the bilateral forefoot with sensitivity to palpation. Vasc  DP and PT pedal pulses palpable bilaterally. Temperature gradient within normal limits.  Neuro Epicritic and protective threshold sensation diminished bilaterally.  Musculoskeletal Exam No symptomatic pedal deformities noted bilateral. Muscular strength within normal limits.  ASSESSMENT 1. Diabetes Mellitus w/ peripheral neuropathy 2. Onychomycosis of nail due to dermatophyte bilateral 3.  Preulcerative callus lesions bilateral feet  4.  Pain in foot bilateral  PLAN OF  CARE 1. Patient evaluated today. 2. Instructed to maintain good pedal hygiene and foot care. Stressed importance of controlling blood sugar.  3. Mechanical debridement of nails 1-5 bilaterally performed using a nail nipper. Filed with dremel without incident.  4.  Excisional debridement of the hyperkeratotic callus tissue was performed using a chisel blade without incident or bleeding  5.  Recommend good supportive sneakers return to clinic in 3 mos.     Demetrios Byron M. Bhavik Cabiness, DPM [facility]  Dr. Susann Lawhorne M. Davey Limas, DPM    [address]                                        , [place] [address]                Office [phone]  Fax [phone]      

## 2020-11-29 ENCOUNTER — Telehealth: Payer: Self-pay

## 2020-11-29 DIAGNOSIS — F17200 Nicotine dependence, unspecified, uncomplicated: Secondary | ICD-10-CM

## 2020-11-29 DIAGNOSIS — Z122 Encounter for screening for malignant neoplasm of respiratory organs: Secondary | ICD-10-CM

## 2020-11-29 DIAGNOSIS — Z87891 Personal history of nicotine dependence: Secondary | ICD-10-CM

## 2020-11-30 NOTE — Telephone Encounter (Signed)
Contacted and scheduled for annual lung screening scan. Patient is a current smoker, with a 54.75 pack year history.

## 2020-12-04 ENCOUNTER — Other Ambulatory Visit: Payer: Self-pay | Admitting: Neurology

## 2020-12-04 DIAGNOSIS — R519 Headache, unspecified: Secondary | ICD-10-CM

## 2020-12-06 HISTORY — PX: MASTECTOMY PARTIAL / LUMPECTOMY: SUR851

## 2020-12-18 ENCOUNTER — Ambulatory Visit
Admission: RE | Admit: 2020-12-18 | Discharge: 2020-12-18 | Disposition: A | Discharge status: Home / Self Care | Payer: 59 | Source: Ambulatory Visit | Attending: Nurse Practitioner | Admitting: Nurse Practitioner

## 2020-12-18 ENCOUNTER — Other Ambulatory Visit: Payer: Self-pay

## 2020-12-18 DIAGNOSIS — F17200 Nicotine dependence, unspecified, uncomplicated: Secondary | ICD-10-CM | POA: Insufficient documentation

## 2020-12-18 DIAGNOSIS — Z122 Encounter for screening for malignant neoplasm of respiratory organs: Secondary | ICD-10-CM | POA: Diagnosis present

## 2020-12-18 DIAGNOSIS — Z87891 Personal history of nicotine dependence: Secondary | ICD-10-CM | POA: Insufficient documentation

## 2020-12-26 ENCOUNTER — Encounter: Payer: Self-pay | Admitting: *Deleted

## 2021-01-23 ENCOUNTER — Ambulatory Visit
Admission: RE | Admit: 2021-01-23 | Discharge: 2021-01-23 | Disposition: A | Discharge status: Home / Self Care | Payer: 59 | Source: Ambulatory Visit | Attending: Neurology | Admitting: Neurology

## 2021-01-23 ENCOUNTER — Other Ambulatory Visit: Payer: Self-pay

## 2021-01-23 DIAGNOSIS — R519 Headache, unspecified: Secondary | ICD-10-CM | POA: Insufficient documentation

## 2021-01-23 MED ORDER — GADOBUTROL 1 MMOL/ML IV SOLN
9.0000 mL | Freq: Once | INTRAVENOUS | Status: AC | PRN
  Administered 2021-01-23: 9 mL via INTRAVENOUS

## 2021-02-05 ENCOUNTER — Telehealth: Payer: Self-pay | Admitting: Internal Medicine

## 2021-02-05 ENCOUNTER — Inpatient Hospital Stay: Payer: 59

## 2021-02-05 ENCOUNTER — Ambulatory Visit (INDEPENDENT_AMBULATORY_CARE_PROVIDER_SITE_OTHER): Payer: 59 | Admitting: Podiatry

## 2021-02-05 ENCOUNTER — Inpatient Hospital Stay: Payer: 59 | Admitting: Internal Medicine

## 2021-02-05 ENCOUNTER — Other Ambulatory Visit: Payer: Self-pay

## 2021-02-05 DIAGNOSIS — M79674 Pain in right toe(s): Secondary | ICD-10-CM | POA: Diagnosis not present

## 2021-02-05 DIAGNOSIS — B351 Tinea unguium: Secondary | ICD-10-CM

## 2021-02-05 DIAGNOSIS — M79675 Pain in left toe(s): Secondary | ICD-10-CM | POA: Diagnosis not present

## 2021-02-05 DIAGNOSIS — L989 Disorder of the skin and subcutaneous tissue, unspecified: Secondary | ICD-10-CM | POA: Diagnosis not present

## 2021-02-05 DIAGNOSIS — E0843 Diabetes mellitus due to underlying condition with diabetic autonomic (poly)neuropathy: Secondary | ICD-10-CM

## 2021-02-05 NOTE — Progress Notes (Signed)
   SUBJECTIVE Patient with a history of diabetes mellitus presents to office today complaining of elongated, thickened nails that cause pain while ambulating in shoes.  She is unable to trim her own nails.  Patient states that there is no new complaints at this time and she is here for the routine foot care.  Past Medical History:  Diagnosis Date   AF (atrial fibrillation) (HCC)    AKI (acute kidney injury) (HCC) 07/21/2019   Arthritis    Asthma    Chicken pox    Clotting disorder (HCC)    Depression    Diabetes mellitus without complication (HCC)    Diverticulosis    Dysrhythmia    GERD (gastroesophageal reflux disease)    Glaucoma    Hemorrhoids    Hemorrhoids, internal    HH (hiatus hernia)    History of hiatal hernia    History of kidney stones    Hypertension    Migraines    Neuropathy    Obesity    Opiate use 03/16/2017   Personal history of tobacco use, presenting hazards to health 06/18/2015   Sleep apnea    Tubular adenoma of colon     OBJECTIVE General Patient is awake, alert, and oriented x 3 and in no acute distress. Derm Skin is dry and supple bilateral. Negative open lesions or macerations. Remaining integument unremarkable. Nails are tender, long, thickened and dystrophic with subungual debris, consistent with onychomycosis, 1-5 bilateral. No signs of infection noted.  Hyperkeratotic callus tissue noted to the plantar aspect of the bilateral forefoot with sensitivity to palpation. Vasc  DP and PT pedal pulses palpable bilaterally. Temperature gradient within normal limits.  Neuro Epicritic and protective threshold sensation diminished bilaterally.  Musculoskeletal Exam No symptomatic pedal deformities noted bilateral. Muscular strength within normal limits.  ASSESSMENT 1. Diabetes Mellitus w/ peripheral neuropathy 2. Onychomycosis of nail due to dermatophyte bilateral 3.  Preulcerative callus lesions bilateral feet  4.  Pain in foot bilateral  PLAN OF  CARE 1. Patient evaluated today. 2. Instructed to maintain good pedal hygiene and foot care. Stressed importance of controlling blood sugar.  3. Mechanical debridement of nails 1-5 bilaterally performed using a nail nipper. Filed with dremel without incident.  4.  Excisional debridement of the hyperkeratotic callus tissue was performed using a chisel blade without incident or bleeding  5.  Recommend good supportive sneakers return to clinic in 3 mos.     Lynn Potter M. Liany Mumpower, DPM [facility]  Dr. Tarren Velardi M. Sujata Maines, DPM    [address]                                        [place], [place] [address]                Office [phone]  Fax [phone]      

## 2021-02-05 NOTE — Telephone Encounter (Signed)
Patient called to reschedule her 5/3 lab/md/venofer appointment due to family emergency. She has been rescheduled to 5/11.

## 2021-02-13 ENCOUNTER — Encounter: Payer: Self-pay | Admitting: Oncology

## 2021-02-13 ENCOUNTER — Inpatient Hospital Stay (HOSPITAL_BASED_OUTPATIENT_CLINIC_OR_DEPARTMENT_OTHER): Payer: 59 | Admitting: Oncology

## 2021-02-13 ENCOUNTER — Inpatient Hospital Stay: Payer: 59 | Attending: Oncology

## 2021-02-13 ENCOUNTER — Inpatient Hospital Stay: Payer: 59

## 2021-02-13 VITALS — BP 153/93 | HR 93 | Temp 99.1°F | Resp 18 | Wt 204.0 lb

## 2021-02-13 DIAGNOSIS — E119 Type 2 diabetes mellitus without complications: Secondary | ICD-10-CM | POA: Insufficient documentation

## 2021-02-13 DIAGNOSIS — F17210 Nicotine dependence, cigarettes, uncomplicated: Secondary | ICD-10-CM | POA: Insufficient documentation

## 2021-02-13 DIAGNOSIS — Z8589 Personal history of malignant neoplasm of other organs and systems: Secondary | ICD-10-CM | POA: Diagnosis not present

## 2021-02-13 DIAGNOSIS — C7A010 Malignant carcinoid tumor of the duodenum: Secondary | ICD-10-CM | POA: Diagnosis not present

## 2021-02-13 DIAGNOSIS — D500 Iron deficiency anemia secondary to blood loss (chronic): Secondary | ICD-10-CM

## 2021-02-13 LAB — CBC WITH DIFFERENTIAL/PLATELET
Abs Immature Granulocytes: 0.02 10*3/uL (ref 0.00–0.07)
Basophils Absolute: 0 10*3/uL (ref 0.0–0.1)
Basophils Relative: 1 %
Eosinophils Absolute: 0.1 10*3/uL (ref 0.0–0.5)
Eosinophils Relative: 2 %
HCT: 39 % (ref 36.0–46.0)
Hemoglobin: 12.7 g/dL (ref 12.0–15.0)
Immature Granulocytes: 0 %
Lymphocytes Relative: 27 %
Lymphs Abs: 1.6 10*3/uL (ref 0.7–4.0)
MCH: 27.1 pg (ref 26.0–34.0)
MCHC: 32.6 g/dL (ref 30.0–36.0)
MCV: 83.2 fL (ref 80.0–100.0)
Monocytes Absolute: 0.4 10*3/uL (ref 0.1–1.0)
Monocytes Relative: 6 %
Neutro Abs: 3.8 10*3/uL (ref 1.7–7.7)
Neutrophils Relative %: 64 %
Platelets: 205 10*3/uL (ref 150–400)
RBC: 4.69 MIL/uL (ref 3.87–5.11)
RDW: 14.3 % (ref 11.5–15.5)
WBC: 5.9 10*3/uL (ref 4.0–10.5)
nRBC: 0 % (ref 0.0–0.2)

## 2021-02-13 LAB — BASIC METABOLIC PANEL
Anion gap: 12 (ref 5–15)
BUN: 17 mg/dL (ref 8–23)
CO2: 27 mmol/L (ref 22–32)
Calcium: 10.1 mg/dL (ref 8.9–10.3)
Chloride: 97 mmol/L — ABNORMAL LOW (ref 98–111)
Creatinine, Ser: 0.9 mg/dL (ref 0.44–1.00)
GFR, Estimated: >60 mL/min (ref >60)
Glucose, Bld: 179 mg/dL — ABNORMAL HIGH (ref 70–99)
Potassium: 3.6 mmol/L (ref 3.5–5.1)
Sodium: 136 mmol/L (ref 135–145)

## 2021-02-13 LAB — IRON AND TIBC
Iron: 57 ug/dL (ref 28–170)
Saturation Ratios: 15 % (ref 10.4–31.8)
TIBC: 393 ug/dL (ref 250–450)
UIBC: 336 ug/dL

## 2021-02-13 LAB — FERRITIN: Ferritin: 27 ng/mL (ref 11–307)

## 2021-02-13 NOTE — Progress Notes (Signed)
Patient here for oncology follow-up appointment, expresses no new complaints or concerns at this time.   

## 2021-02-13 NOTE — Progress Notes (Signed)
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, ENDOSCOPIC RESECTION:  - WELL-DIFFERENTIATED NEUROENDOCRINE TUMOR.; Tumor size: 0.6 cm  Tumor focality: single focus; Histologic type: well differentiated neuroendocrine tumor  Histologic grade: grade 1; Mitotic rate: < 2 mitosis in 10 hpf and < 3% KI-67 index   #Chronic mild anemia microcytic-baseline-11-12; June September 2020-9; microcytic; [multiple prior EGD/colonoscopies; [facility]]; no bone marrow biopsy; October 2020 EGD colonoscopy-no acute bleeding etiology [Dr.Toledo]  # DM/active smoker/remote history of TIA   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  female is here for follow-up of anemia.  Lynn Potter was evaluated in October 2020-for severe microcytic anemia iron deficiency hemoglobin 8.3.  Lynn Potter received EGD colonoscopy-no obvious evidence of bleeding.  Otherwise Lynn Potter denies any blood in stools or black-colored stool.  INTERVAL HISTORY Lynn Potter is a 75 y.o. female who has above history reviewed by me today presents for follow up visit for iron deficiency follow-up Lynn Potter follows up with Dr.Brahmanday who is off today, I am covering him to see this Lynn Potter. Lynn Potter takes oral iron supplementation ferrous sulfate 325 mg twice daily. Lynn Potter reports feeling well.  Denies any black or bloody stool. Lynn Potter denies unintentional weight loss, night sweats, fever.  Denies any constipation Lynn Potter reports chronic intermittent loose stool episodes.   Review of Systems  Constitutional: Positive for malaise/fatigue. Negative for chills, diaphoresis, fever and weight loss.  HENT: Negative for nosebleeds and  sore throat.   Eyes: Negative for double vision.  Respiratory: Negative for cough, hemoptysis, sputum production, shortness of breath and wheezing.   Cardiovascular: Negative for chest pain, palpitations, orthopnea and leg swelling.  Gastrointestinal: Negative for abdominal pain, blood in stool, constipation, heartburn, melena, nausea and vomiting.  Genitourinary: Negative for dysuria, frequency and urgency.  Musculoskeletal: Positive for back pain and joint pain.  Skin: Negative for itching and rash.  Neurological: Negative for dizziness, tingling, focal weakness, weakness and headaches.  Endo/Heme/Allergies: Does not bruise/bleed easily.  Psychiatric/Behavioral: Negative for depression. The Lynn Potter is not nervous/anxious and does not have insomnia.     MEDICAL HISTORY:  Past Medical History:  Diagnosis Date  . AF (atrial fibrillation) ([place])   . AKI (acute kidney injury) ([place]) 07/21/2019  . Arthritis   . Asthma   . Chicken pox   . Clotting disorder ([place])   . Depression   . Diabetes mellitus without complication ([place])   . Diverticulosis   . Dysrhythmia   . GERD (gastroesophageal reflux disease)   . Glaucoma   . Hemorrhoids   . Hemorrhoids, internal   . HH (hiatus hernia)   . History of hiatal hernia   . History of kidney stones   . Hypertension   . Migraines   . Neuropathy   . Obesity   . Opiate use 03/16/2017  . Personal history of tobacco use, presenting hazards to health 06/18/2015  . Sleep apnea   . Tubular adenoma of colon     SURGICAL HISTORY: Past Surgical History:  Procedure Laterality Date  . ABDOMINAL HYSTERECTOMY    . BACK SURGERY    . CARPAL TUNNEL RELEASE    . carpal tunnell    . CATARACT EXTRACTION    .  CHOLECYSTECTOMY    . COLONOSCOPY WITH ESOPHAGOGASTRODUODENOSCOPY (EGD)    . COLONOSCOPY WITH PROPOFOL N/A 08/03/2019   Procedure: COLONOSCOPY WITH PROPOFOL;  Surgeon: Toledo, Benay Pike, MD;  Location: [facility] ENDOSCOPY;  Service: Gastroenterology;   Laterality: N/A;  . ESOPHAGOGASTRODUODENOSCOPY (EGD) WITH PROPOFOL N/A 02/16/2017   Procedure: ESOPHAGOGASTRODUODENOSCOPY (EGD) WITH PROPOFOL;  Surgeon: Manya Silvas, MD;  Location: [facility] ENDOSCOPY;  Service: Endoscopy;  Laterality: N/A;  . ESOPHAGOGASTRODUODENOSCOPY (EGD) WITH PROPOFOL N/A 08/03/2019   Procedure: ESOPHAGOGASTRODUODENOSCOPY (EGD) WITH PROPOFOL;  Surgeon: Toledo, Benay Pike, MD;  Location: [facility] ENDOSCOPY;  Service: Gastroenterology;  Laterality: N/A;  . EYE SURGERY    . JOINT REPLACEMENT Bilateral 1999   TOTAL KNEE  . LUMBAR FUSION    . REPAIR ROTATOR CUFF TEAR    . TOTAL KNEE ARTHROPLASTY    . UPPER ESOPHAGEAL ENDOSCOPIC ULTRASOUND (EUS) N/A 01/03/2016   Procedure: UPPER ESOPHAGEAL ENDOSCOPIC ULTRASOUND (EUS);  Surgeon: Holly Bodily, MD;  Location: [facility] ENDOSCOPY;  Service: Gastroenterology;  Laterality: N/A;    SOCIAL HISTORY: Social History   Socioeconomic History  . Marital status: Single    Spouse name: Not on file  . Number of children: Not on file  . Years of education: Not on file  . Highest education level: Not on file  Occupational History  . Not on file  Tobacco Use  . Smoking status: Current Every Day Smoker    Packs/day: 1.00    Types: Cigarettes  . Smokeless tobacco: Never Used  Vaping Use  . Vaping Use: Never used  Substance and Sexual Activity  . Alcohol use: No  . Drug use: No  . Sexual activity: Not on file  Other Topics Concern  . Not on file  Social History Narrative   Lives in [place], lives alone. Rolling walker sec to back/hip pain. 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 Smoke ~1ppd/>50; No alcohol.    Social Determinants of Health   Financial Resource Strain: Not on file  Food Insecurity: Not on file  Transportation Needs: Not on file  Physical Activity: Not on file  Stress: Not on file  Social Connections: Not on file  Intimate Partner Violence: Not on file    FAMILY HISTORY: Family History  Problem Relation Age of Onset  .  Diabetes Mother   . Diabetes Sister     ALLERGIES:  has No Known Allergies.  MEDICATIONS:  Current Outpatient Medications  Medication Sig Dispense Refill  . amLODipine (NORVASC) 10 MG tablet Take 10 mg by mouth daily.    . Ascorbic Acid (VITAMIN C) 1000 MG tablet Take 1,000 mg by mouth daily.    Marland Kitchen atorvastatin (LIPITOR) 20 MG tablet Take 20 mg by mouth daily.    . Cholecalciferol 125 MCG (5000 UT) TABS Take 5,000 Units by mouth daily.     . COMBIVENT RESPIMAT 20-100 MCG/ACT AERS respimat USE 1 VIAL VIA NEBULIZER 4 TIMES A DAY AS NEEDED    . ferrous sulfate 325 (65 FE) MG EC tablet Take 325 mg by mouth 2 (two) times daily.     . folic acid (FOLVITE) 1 MG tablet Take 1 mg by mouth daily.    . hydrOXYzine (VISTARIL) 25 MG capsule Take 25 mg by mouth every 12 (twelve) hours as needed.    . insulin lispro (HUMALOG) 100 UNIT/ML KwikPen Inject 3 Units into the skin 3 (three) times daily.     Marland Kitchen JARDIANCE 25 MG TABS tablet Take 25 mg by mouth daily.     Marland Kitchen latanoprost (XALATAN)  0.005 % ophthalmic solution Place 1 drop into both eyes at bedtime.     Marland Kitchen LEVEMIR FLEXTOUCH 100 UNIT/ML Pen Inject 25 Units into the skin daily. Lynn Potter reports Lynn Potter only takes Levemir if her glucose is >150 mg/dl which is rare for her  5  . metFORMIN (GLUCOPHAGE) 500 MG tablet Take 500 mg by mouth 2 (two) times daily with a meal.    . olmesartan-hydrochlorothiazide (BENICAR HCT) 40-25 MG tablet Take 1 tablet by mouth daily.    . Omega-3 Fatty Acids (FISH OIL) 1000 MG CAPS Take by mouth daily.    . pantoprazole (PROTONIX) 40 MG tablet Take 1 tablet by mouth 2 (two) times daily.     . pregabalin (LYRICA) 150 MG capsule Take 150 mg by mouth 2 (two) times daily.     Marland Kitchen rOPINIRole (REQUIP) 1 MG tablet TAKE 1 TABLET BY MOUTH EVERYDAY AT BEDTIME    . traZODone (DESYREL) 50 MG tablet     . vitamin B-12 1000 MCG tablet Take 1 tablet (1,000 mcg total) by mouth daily. 30 tablet 1  . aspirin 81 MG tablet Take 81 mg by mouth daily.  (Lynn Potter not taking: Reported on 02/13/2021)    . atorvastatin (LIPITOR) 40 MG tablet Take 1 tablet (40 mg total) by mouth daily. 30 tablet 0   No current facility-administered medications for this visit.      PHYSICAL EXAMINATION:   Vitals:   02/13/21 1100  BP: (!) 153/93  Pulse: 93  Resp: 18  Temp: 99.1 F (37.3 C)  SpO2: 100%   Filed Weights   02/13/21 1100  Weight: 204 lb (92.5 kg)    Physical Exam Constitutional:      Comments: Obese.  Walking with a rolling walker.  Lynn Potter is alone.  HENT:     Head: Normocephalic and atraumatic.     Mouth/Throat:     Pharynx: No oropharyngeal exudate.  Eyes:     Pupils: Pupils are equal, round, and reactive to light.  Cardiovascular:     Rate and Rhythm: Normal rate and regular rhythm.  Pulmonary:     Effort: No respiratory distress.     Breath sounds: No wheezing.  Abdominal:     General: Bowel sounds are normal. There is no distension.     Palpations: Abdomen is soft. There is no mass.     Tenderness: There is no abdominal tenderness. There is no guarding or rebound.  Musculoskeletal:        General: No tenderness. Normal range of motion.     Cervical back: Normal range of motion and neck supple.  Skin:    General: Skin is warm.  Neurological:     Mental Status: Lynn Potter is alert and oriented to person, place, and time.  Psychiatric:        Mood and Affect: Affect normal.     LABORATORY DATA:  I have reviewed the data as listed Lab Results  Component Value Date   WBC 5.9 02/13/2021   HGB 12.7 02/13/2021   HCT 39.0 02/13/2021   MCV 83.2 02/13/2021   PLT 205 02/13/2021   Recent Labs    08/06/20 1256 08/10/20 1708 02/13/21 1031  NA 135 137 136  K 3.9 3.4* 3.6  CL 99 100 97*  CO2 27 27 27   GLUCOSE 108* 131* 179*  BUN 17 17 17   CREATININE 0.84 0.87 0.90  CALCIUM 10.1 9.9 10.1  GFRNONAA >60 >60 >60  PROT 8.3*  --   --  ALBUMIN 3.9  --   --   AST 16  --   --   ALT 16  --   --   ALKPHOS 74  --   --   BILITOT  0.8  --   --      MR BRAIN W WO CONTRAST  Result Date: 01/23/2021 CLINICAL DATA:  Chronic daily headaches. Blurred vision and dizziness. EXAM: MRI HEAD WITHOUT AND WITH CONTRAST TECHNIQUE: Multiplanar, multiecho pulse sequences of the brain and surrounding structures were obtained without and with intravenous contrast. CONTRAST:  50m GADAVIST GADOBUTROL 1 MMOL/ML IV SOLN COMPARISON:  MRI head 07/22/2019 FINDINGS: Brain: Mild atrophy. Multiple small deep white matter hyperintensities bilaterally unchanged from the prior study. Negative for acute infarct.  Negative for hemorrhage or mass. Vascular: Normal arterial flow voids. Skull and upper cervical spine: No focal skeletal abnormality. Sinuses/Orbits: Paranasal sinuses clear. Bilateral cataract extraction Other: None IMPRESSION: Atrophy and chronic microvascular ischemic change in the white matter No acute abnormality no change from prior MRI 2020 Electronically Signed   By: CFranchot GalloM.D.   On: 01/23/2021 15:38    Assessment and plan 1. Iron deficiency anemia due to chronic blood loss   2. 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  Hemoglobin has remained normal and stable. Iron saturation has improved to 15, ferritin is normal and stable. Hold additional IV Venofer treatments.  I encourage Lynn Potter to continue ferrous sulfate 325 mg twice daily.  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  Monitor clinically.  Follow-up in 6 months with Dr. BRogue Bussing  Labs prior, CBC iron TIBC ferritin, BMP, MD visit +/- Venofer Potter questions were answered. The Lynn Potter knows to call the clinic with any problems, questions or concerns.    ZEarlie Server MD 02/13/2021 8:30 PM

## 2021-02-22 NOTE — Telephone Encounter (Signed)
error 

## 2021-04-24 DIAGNOSIS — G5603 Carpal tunnel syndrome, bilateral upper limbs: Secondary | ICD-10-CM | POA: Insufficient documentation

## 2021-05-14 ENCOUNTER — Other Ambulatory Visit: Payer: Self-pay

## 2021-05-14 ENCOUNTER — Ambulatory Visit (INDEPENDENT_AMBULATORY_CARE_PROVIDER_SITE_OTHER): Payer: 59 | Admitting: Podiatry

## 2021-05-14 DIAGNOSIS — B351 Tinea unguium: Secondary | ICD-10-CM

## 2021-05-14 DIAGNOSIS — L989 Disorder of the skin and subcutaneous tissue, unspecified: Secondary | ICD-10-CM

## 2021-05-14 DIAGNOSIS — E0843 Diabetes mellitus due to underlying condition with diabetic autonomic (poly)neuropathy: Secondary | ICD-10-CM

## 2021-05-14 DIAGNOSIS — M79674 Pain in right toe(s): Secondary | ICD-10-CM | POA: Diagnosis not present

## 2021-05-14 DIAGNOSIS — M79675 Pain in left toe(s): Secondary | ICD-10-CM | POA: Diagnosis not present

## 2021-05-14 NOTE — Progress Notes (Signed)
   SUBJECTIVE Patient with a history of diabetes mellitus presents to office today complaining of elongated, thickened nails that cause pain while ambulating in shoes.  She is unable to trim her own nails.  Patient states that there is no new complaints at this time and she is here for the routine foot care.  Past Medical History:  Diagnosis Date   AF (atrial fibrillation) ([facility])    AKI (acute kidney injury) ([place]) 07/21/2019   Arthritis    Asthma    Chicken pox    Clotting disorder ([facility])    Depression    Diabetes mellitus without complication ([facility])    Diverticulosis    Dysrhythmia    GERD (gastroesophageal reflux disease)    Glaucoma    Hemorrhoids    Hemorrhoids, internal    HH (hiatus hernia)    History of hiatal hernia    History of kidney stones    Hypertension    Migraines    Neuropathy    Obesity    Opiate use 03/16/2017   Personal history of tobacco use, presenting hazards to health 06/18/2015   Sleep apnea    Tubular adenoma of colon     OBJECTIVE General Patient is awake, alert, and oriented x 3 and in no acute distress. Derm Skin is dry and supple bilateral. Negative open lesions or macerations. Remaining integument unremarkable. Nails are tender, long, thickened and dystrophic with subungual debris, consistent with onychomycosis, 1-5 bilateral. No signs of infection noted.  Hyperkeratotic callus tissue noted to the plantar aspect of the bilateral forefoot with sensitivity to palpation. Vasc  DP and PT pedal pulses palpable bilaterally. Temperature gradient within normal limits.  Neuro Epicritic and protective threshold sensation diminished bilaterally.  Musculoskeletal Exam No symptomatic pedal deformities noted bilateral. Muscular strength within normal limits.  ASSESSMENT 1. Diabetes Mellitus w/ peripheral neuropathy 2. Onychomycosis of nail due to dermatophyte bilateral 3.  Preulcerative callus lesions bilateral feet  4.  Pain in foot bilateral  PLAN OF  CARE 1. Patient evaluated today. 2. Instructed to maintain good pedal hygiene and foot care. Stressed importance of controlling blood sugar.  3. Mechanical debridement of nails 1-5 bilaterally performed using a nail nipper. Filed with dremel without incident.  4.  Excisional debridement of the hyperkeratotic callus tissue was performed using a chisel blade without incident or bleeding  5.  Recommend good supportive sneakers return to clinic in 3 mos.     Edrick Kins, DPM [facility]  Dr. Edrick Kins, [place]                                        [place], [place] [address]                Office [phone]  Fax [phone]

## 2021-08-02 ENCOUNTER — Encounter: Payer: Self-pay | Admitting: Internal Medicine

## 2021-08-09 ENCOUNTER — Encounter: Payer: Self-pay | Admitting: Internal Medicine

## 2021-08-14 ENCOUNTER — Other Ambulatory Visit: Payer: Self-pay | Admitting: *Deleted

## 2021-08-14 DIAGNOSIS — D500 Iron deficiency anemia secondary to blood loss (chronic): Secondary | ICD-10-CM

## 2021-08-16 ENCOUNTER — Encounter (INDEPENDENT_AMBULATORY_CARE_PROVIDER_SITE_OTHER): Payer: Self-pay

## 2021-08-16 ENCOUNTER — Encounter: Payer: Self-pay | Admitting: Internal Medicine

## 2021-08-16 ENCOUNTER — Ambulatory Visit (INDEPENDENT_AMBULATORY_CARE_PROVIDER_SITE_OTHER): Payer: Medicare Other | Admitting: Podiatry

## 2021-08-16 ENCOUNTER — Other Ambulatory Visit: Payer: Self-pay

## 2021-08-16 DIAGNOSIS — M79674 Pain in right toe(s): Secondary | ICD-10-CM | POA: Diagnosis not present

## 2021-08-16 DIAGNOSIS — M79675 Pain in left toe(s): Secondary | ICD-10-CM

## 2021-08-16 DIAGNOSIS — L989 Disorder of the skin and subcutaneous tissue, unspecified: Secondary | ICD-10-CM

## 2021-08-16 DIAGNOSIS — B351 Tinea unguium: Secondary | ICD-10-CM | POA: Diagnosis not present

## 2021-08-16 NOTE — Progress Notes (Signed)
   SUBJECTIVE Patient with a history of diabetes mellitus presents to office today complaining of elongated, thickened nails that cause pain while ambulating in shoes.  She is unable to trim her own nails.  Patient states that there is no new complaints at this time and she is here for the routine foot care.  Past Medical History:  Diagnosis Date   AF (atrial fibrillation) ([facility])    AKI (acute kidney injury) ([place]) 07/21/2019   Arthritis    Asthma    Chicken pox    Clotting disorder ([facility])    Depression    Diabetes mellitus without complication ([facility])    Diverticulosis    Dysrhythmia    GERD (gastroesophageal reflux disease)    Glaucoma    Hemorrhoids    Hemorrhoids, internal    HH (hiatus hernia)    History of hiatal hernia    History of kidney stones    Hypertension    Migraines    Neuropathy    Obesity    Opiate use 03/16/2017   Personal history of tobacco use, presenting hazards to health 06/18/2015   Sleep apnea    Tubular adenoma of colon     OBJECTIVE General Patient is awake, alert, and oriented x 3 and in no acute distress. Derm Skin is dry and supple bilateral. Negative open lesions or macerations. Remaining integument unremarkable. Nails are tender, long, thickened and dystrophic with subungual debris, consistent with onychomycosis, 1-5 bilateral. No signs of infection noted.  Hyperkeratotic callus tissue noted to the plantar aspect of the bilateral forefoot with sensitivity to palpation. Vasc  DP and PT pedal pulses palpable bilaterally. Temperature gradient within normal limits.  Neuro Epicritic and protective threshold sensation diminished bilaterally.  Musculoskeletal Exam No symptomatic pedal deformities noted bilateral. Muscular strength within normal limits.  ASSESSMENT 1. Diabetes Mellitus w/ peripheral neuropathy 2. Onychomycosis of nail due to dermatophyte bilateral 3.  Preulcerative callus lesions bilateral feet  4.  Pain in foot bilateral  PLAN OF  CARE 1. Patient evaluated today. 2. Instructed to maintain good pedal hygiene and foot care. Stressed importance of controlling blood sugar.  3. Mechanical debridement of nails 1-5 bilaterally performed using a nail nipper. Filed with dremel without incident.  4.  Excisional debridement of the hyperkeratotic callus tissue was performed using a chisel blade without incident or bleeding  5.  Recommend good supportive sneakers return to clinic in 3 mos.     Edrick Kins, DPM [facility]  Dr. Edrick Kins, DPM    [address], [place] [address]                Office [phone]  Fax [phone]

## 2021-08-19 ENCOUNTER — Inpatient Hospital Stay: Payer: Medicaid Other

## 2021-08-19 ENCOUNTER — Telehealth: Payer: Self-pay | Admitting: Internal Medicine

## 2021-08-19 ENCOUNTER — Inpatient Hospital Stay: Payer: Medicaid Other | Admitting: Internal Medicine

## 2021-08-19 NOTE — Telephone Encounter (Signed)
Pt resch getting checked out for flu vs covid

## 2021-08-19 NOTE — Telephone Encounter (Signed)
Pt called to cancel appt for today. She has a cold. Please call back  to reschedule at [phone]

## 2021-09-06 ENCOUNTER — Inpatient Hospital Stay (HOSPITAL_BASED_OUTPATIENT_CLINIC_OR_DEPARTMENT_OTHER): Payer: Medicare Other | Admitting: Internal Medicine

## 2021-09-06 ENCOUNTER — Inpatient Hospital Stay: Payer: Medicare Other | Attending: Internal Medicine

## 2021-09-06 ENCOUNTER — Other Ambulatory Visit: Payer: Self-pay

## 2021-09-06 ENCOUNTER — Inpatient Hospital Stay: Payer: Medicare Other

## 2021-09-06 VITALS — BP 155/97 | HR 105 | Temp 99.8°F | Resp 20 | Wt 204.8 lb

## 2021-09-06 DIAGNOSIS — F17210 Nicotine dependence, cigarettes, uncomplicated: Secondary | ICD-10-CM | POA: Insufficient documentation

## 2021-09-06 DIAGNOSIS — D509 Iron deficiency anemia, unspecified: Secondary | ICD-10-CM | POA: Insufficient documentation

## 2021-09-06 DIAGNOSIS — D500 Iron deficiency anemia secondary to blood loss (chronic): Secondary | ICD-10-CM | POA: Diagnosis not present

## 2021-09-06 LAB — BASIC METABOLIC PANEL
Anion gap: 7 (ref 5–15)
BUN: 9 mg/dL (ref 8–23)
CO2: 28 mmol/L (ref 22–32)
Calcium: 9.5 mg/dL (ref 8.9–10.3)
Chloride: 99 mmol/L (ref 98–111)
Creatinine, Ser: 0.84 mg/dL (ref 0.44–1.00)
GFR, Estimated: >60 mL/min (ref >60)
Glucose, Bld: 116 mg/dL — ABNORMAL HIGH (ref 70–99)
Potassium: 4.1 mmol/L (ref 3.5–5.1)
Sodium: 134 mmol/L — ABNORMAL LOW (ref 135–145)

## 2021-09-06 LAB — CBC WITH DIFFERENTIAL/PLATELET
Abs Immature Granulocytes: 0.03 10*3/uL (ref 0.00–0.07)
Basophils Absolute: 0 10*3/uL (ref 0.0–0.1)
Basophils Relative: 0 %
Eosinophils Absolute: 0.2 10*3/uL (ref 0.0–0.5)
Eosinophils Relative: 2 %
HCT: 42.5 % (ref 36.0–46.0)
Hemoglobin: 14.1 g/dL (ref 12.0–15.0)
Immature Granulocytes: 0 %
Lymphocytes Relative: 22 %
Lymphs Abs: 2 10*3/uL (ref 0.7–4.0)
MCH: 28.3 pg (ref 26.0–34.0)
MCHC: 33.2 g/dL (ref 30.0–36.0)
MCV: 85.3 fL (ref 80.0–100.0)
Monocytes Absolute: 0.7 10*3/uL (ref 0.1–1.0)
Monocytes Relative: 8 %
Neutro Abs: 6.4 10*3/uL (ref 1.7–7.7)
Neutrophils Relative %: 68 %
Platelets: 265 10*3/uL (ref 150–400)
RBC: 4.98 MIL/uL (ref 3.87–5.11)
RDW: 13.4 % (ref 11.5–15.5)
WBC: 9.4 10*3/uL (ref 4.0–10.5)
nRBC: 0 % (ref 0.0–0.2)

## 2021-09-06 LAB — IRON AND TIBC
Iron: 34 ug/dL (ref 28–170)
Saturation Ratios: 10 % — ABNORMAL LOW (ref 10.4–31.8)
TIBC: 332 ug/dL (ref 250–450)
UIBC: 298 ug/dL

## 2021-09-06 LAB — FERRITIN: Ferritin: 40 ng/mL (ref 11–307)

## 2021-09-06 NOTE — Progress Notes (Signed)
Patient denies new problems/concerns today.  Blood pressure is elevated in office today, 155/97, she states that bp does go up with a lot of walking.

## 2021-09-06 NOTE — Assessment & Plan Note (Addendum)
#  Iron deficiency anemia-currently on p.o. iron.  Hemoglobin today is 14   Continue p.o. iron.  Hold off any iron infusion.  #Etiology of iron deficiency-unclear.  October 2020-EGD colonoscopy no obvious etiology of bleeding noted.   # NET remote history-stage I.  Low risk of recurrence.  S/p bowel re-section; clinically no dense of recurrence.  STABLE  # Smoking: counseled  quitting smoking; FEb 2021- lung cancer screeing-NEGATIVE; continue surveillance imaging.    # DISPOSITION:  # no venofer # follow up in end of OCT 2022- MD; labs- cbc/cmp; iron stuides.ferritin; possible venofer Dr.B

## 2021-09-06 NOTE — Progress Notes (Signed)
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, ENDOSCOPIC RESECTION:  - WELL-DIFFERENTIATED NEUROENDOCRINE TUMOR.; Tumor size: 0.6 cm  Tumor focality: single focus; Histologic type: well differentiated neuroendocrine tumor  Histologic grade: grade 1; Mitotic rate: < 2 mitosis in 10 hpf and < 3% KI-67 index   #Chronic mild anemia microcytic-baseline-11-12; June September 2020-9; microcytic; [multiple prior EGD/colonoscopies; [facility]]; no bone marrow biopsy; October 2020 EGD colonoscopy-no acute bleeding etiology [Dr.Toledo]  # DM/active smoker/remote history of TIA   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  Walks with a rolling walker.  Accompanied by family. Lynn Potter 75 y.o.  female is here for follow-up of anemia-iron deficiency question etiology is here for follow-up.  Patient denies any blood in stools or black-colored stools.  She is compliant with oral iron pills.  Review of Systems  Constitutional:  Positive for malaise/fatigue. Negative for chills, diaphoresis, fever and weight loss.  HENT:  Negative for nosebleeds and sore throat.   Eyes:  Negative for double vision.  Respiratory:  Negative for cough, hemoptysis, sputum production, shortness of breath and wheezing.   Cardiovascular:  Negative for chest pain, palpitations, orthopnea and leg swelling.  Gastrointestinal:  Negative for abdominal pain, blood in stool, constipation, diarrhea, heartburn, melena, nausea and vomiting.  Genitourinary:  Negative for dysuria, frequency and urgency.  Musculoskeletal:  Positive for back pain and joint pain.  Skin: Negative.  Negative for itching and rash.  Neurological:   Negative for dizziness, tingling, focal weakness, weakness and headaches.  Endo/Heme/Allergies:  Does not bruise/bleed easily.  Psychiatric/Behavioral:  Negative for depression. The patient is not nervous/anxious and does not have insomnia.    MEDICAL HISTORY:  Past Medical History:  Diagnosis Date   AF (atrial fibrillation) (HCC)    AKI (acute kidney injury) ([place]) 07/21/2019   Arthritis    Asthma    Chicken pox    Clotting disorder (HCC)    Depression    Diabetes mellitus without complication (HCC)    Diverticulosis    Dysrhythmia    GERD (gastroesophageal reflux disease)    Glaucoma    Hemorrhoids    Hemorrhoids, internal    HH (hiatus hernia)    History of hiatal hernia    History of kidney stones    Hypertension    Migraines    Neuropathy    Obesity    Opiate use 03/16/2017   Personal history of tobacco use, presenting hazards to health 06/18/2015   Sleep apnea    Tubular adenoma of colon     SURGICAL HISTORY: Past Surgical History:  Procedure Laterality Date   ABDOMINAL HYSTERECTOMY     BACK SURGERY     CARPAL TUNNEL RELEASE     carpal tunnell     CATARACT EXTRACTION     CHOLECYSTECTOMY     COLONOSCOPY WITH ESOPHAGOGASTRODUODENOSCOPY (EGD)     COLONOSCOPY WITH PROPOFOL N/A 08/03/2019   Procedure: COLONOSCOPY WITH PROPOFOL;  Surgeon: Toledo, Benay Pike, MD;  Location: [facility] ENDOSCOPY;  Service: Gastroenterology;  Laterality: N/A;   ESOPHAGOGASTRODUODENOSCOPY (EGD) WITH PROPOFOL N/A 02/16/2017   Procedure: ESOPHAGOGASTRODUODENOSCOPY (EGD) WITH PROPOFOL;  Surgeon: Manya Silvas, MD;  Location: [facility] ENDOSCOPY;  Service: Endoscopy;  Laterality: N/A;  ESOPHAGOGASTRODUODENOSCOPY (EGD) WITH PROPOFOL N/A 08/03/2019   Procedure: ESOPHAGOGASTRODUODENOSCOPY (EGD) WITH PROPOFOL;  Surgeon: Toledo, Teodoro K, MD;  Location: [facility] ENDOSCOPY;  Service: Gastroenterology;  Laterality: N/A;   EYE SURGERY     JOINT REPLACEMENT Bilateral 1999   TOTAL KNEE   LUMBAR FUSION      REPAIR ROTATOR CUFF TEAR     TOTAL KNEE ARTHROPLASTY     UPPER ESOPHAGEAL ENDOSCOPIC ULTRASOUND (EUS) N/A 01/03/2016   Procedure: UPPER ESOPHAGEAL ENDOSCOPIC ULTRASOUND (EUS);  Surgeon: Rebecca A Burbridge, MD;  Location: [facility] ENDOSCOPY;  Service: Gastroenterology;  Laterality: N/A;    SOCIAL HISTORY: Social History   Socioeconomic History   Marital status: Single    Spouse name: Not on file   Number of children: Not on file   Years of education: Not on file   Highest education level: Not on file  Occupational History   Not on file  Tobacco Use   Smoking status: Every Day    Packs/day: 1.00    Types: Cigarettes   Smokeless tobacco: Never  Vaping Use   Vaping Use: Never used  Substance and Sexual Activity   Alcohol use: No   Drug use: No   Sexual activity: Not on file  Other Topics Concern   Not on file  Social History Narrative   Lives in [place], lives alone. Rolling walker sec to back/hip pain. Used work in textile. Smoke ~1ppd/>50; No alcohol.    Social Determinants of Health   Financial Resource Strain: Not on file  Food Insecurity: Not on file  Transportation Needs: Not on file  Physical Activity: Not on file  Stress: Not on file  Social Connections: Not on file  Intimate Partner Violence: Not on file    FAMILY HISTORY: Family History  Problem Relation Age of Onset   Diabetes Mother    Diabetes Sister     ALLERGIES:  has No Known Allergies.  MEDICATIONS:  Current Outpatient Medications  Medication Sig Dispense Refill   amLODipine (NORVASC) 10 MG tablet Take 10 mg by mouth daily.     Ascorbic Acid (VITAMIN C) 1000 MG tablet Take 1,000 mg by mouth daily.     atorvastatin (LIPITOR) 20 MG tablet Take 20 mg by mouth daily.     Cholecalciferol 125 MCG (5000 UT) TABS Take 5,000 Units by mouth daily.      COMBIVENT RESPIMAT 20-100 MCG/ACT AERS respimat USE 1 VIAL VIA NEBULIZER 4 TIMES A DAY AS NEEDED     folic acid (FOLVITE) 1 MG tablet Take 1 mg by mouth  daily.     hydrOXYzine (VISTARIL) 25 MG capsule Take 25 mg by mouth every 12 (twelve) hours as needed.     insulin lispro (HUMALOG) 100 UNIT/ML KwikPen Inject 3 Units into the skin 3 (three) times daily.      JARDIANCE 25 MG TABS tablet Take 25 mg by mouth daily.      latanoprost (XALATAN) 0.005 % ophthalmic solution Place 1 drop into both eyes at bedtime.      LEVEMIR FLEXTOUCH 100 UNIT/ML Pen Inject 25 Units into the skin daily. Patient reports she only takes Levemir if her glucose is >150 mg/dl which is rare for her  5   metFORMIN (GLUCOPHAGE) 500 MG tablet Take 500 mg by mouth 2 (two) times daily with a meal.     olmesartan-hydrochlorothiazide (BENICAR HCT) 40-25 MG tablet Take 1 tablet by mouth daily.     Omega-3 Fatty Acids (FISH OIL) 1000 MG CAPS Take by mouth   daily.     pantoprazole (PROTONIX) 40 MG tablet Take 1 tablet by mouth 2 (two) times daily.      pregabalin (LYRICA) 150 MG capsule Take 150 mg by mouth 2 (two) times daily.      rOPINIRole (REQUIP) 1 MG tablet TAKE 1 TABLET BY MOUTH EVERYDAY AT BEDTIME     traZODone (DESYREL) 50 MG tablet      vitamin B-12 1000 MCG tablet Take 1 tablet (1,000 mcg total) by mouth daily. 30 tablet 1   aspirin 81 MG tablet Take 81 mg by mouth daily. (Patient not taking: Reported on 02/13/2021)     atorvastatin (LIPITOR) 40 MG tablet Take 1 tablet (40 mg total) by mouth daily. 30 tablet 0   ferrous sulfate 325 (65 FE) MG EC tablet Take 325 mg by mouth 2 (two) times daily.  (Patient not taking: Reported on 09/06/2021)     No current facility-administered medications for this visit.      PHYSICAL EXAMINATION:   Vitals:   09/06/21 1446  BP: (!) 155/97  Pulse: (!) 105  Resp: 20  Temp: 99.8 F (37.7 C)   Filed Weights   09/06/21 1446  Weight: 204 lb 12.8 oz (92.9 kg)    Physical Exam Constitutional:      Comments: Obese.  Walking with a rolling walker.  She is alone.  HENT:     Head: Normocephalic and atraumatic.     Mouth/Throat:      Pharynx: No oropharyngeal exudate.  Eyes:     Pupils: Pupils are equal, round, and reactive to light.  Cardiovascular:     Rate and Rhythm: Normal rate and regular rhythm.  Pulmonary:     Effort: No respiratory distress.     Breath sounds: No wheezing.  Abdominal:     General: Bowel sounds are normal. There is no distension.     Palpations: Abdomen is soft. There is no mass.     Tenderness: There is no abdominal tenderness. There is no guarding or rebound.  Musculoskeletal:        General: No tenderness. Normal range of motion.     Cervical back: Normal range of motion and neck supple.  Skin:    General: Skin is warm.  Neurological:     Mental Status: She is alert and oriented to person, place, and time.  Psychiatric:        Mood and Affect: Affect normal.    LABORATORY DATA:  I have reviewed the data as listed Lab Results  Component Value Date   WBC 9.4 09/06/2021   HGB 14.1 09/06/2021   HCT 42.5 09/06/2021   MCV 85.3 09/06/2021   PLT 265 09/06/2021   Recent Labs    02/13/21 1031 09/06/21 1431  NA 136 134*  K 3.6 4.1  CL 97* 99  CO2 27 28  GLUCOSE 179* 116*  BUN 17 9  CREATININE 0.90 0.84  CALCIUM 10.1 9.5  GFRNONAA >60 >60     No results found.   Microcytic anemia #Iron deficiency anemia-currently on p.o. iron.  Hemoglobin today is 14   Continue p.o. iron.  Hold off any iron infusion.  #Etiology of iron deficiency-unclear.  October 2020-EGD colonoscopy no obvious etiology of bleeding noted.   # NET remote history-stage I.  Low risk of recurrence.  S/p bowel re-section; clinically no dense of recurrence.  STABLE  # Smoking: counseled  quitting smoking; FEb 2021- lung cancer screeing-NEGATIVE; continue surveillance imaging.    # DISPOSITION:  #  no venofer # follow up in end of OCT 2022- MD; labs- cbc/cmp; iron stuides.ferritin; possible venofer Dr.B  All questions were answered. The patient knows to call the clinic with any problems, questions or  concerns.    Govinda R Brahmanday, MD 09/06/2021 3:29 PM   

## 2021-10-03 ENCOUNTER — Encounter: Payer: Self-pay | Admitting: Internal Medicine

## 2021-11-02 IMAGING — RF DG ESOPHAGUS
9 of 11 series · 14 of 24 positions shown · non-contrast
Comparison: None.

CLINICAL DATA: Dysphagia, trouble swallowing

EXAM:
ESOPHOGRAM / BARIUM SWALLOW / BARIUM TABLET STUDY
TECHNIQUE: Combined double contrast and single contrast examination performed
using effervescent crystals, thick barium liquid, and thin barium
liquid. The patient was observed with fluoroscopy swallowing a 13 mm
barium sulphate tablet.
FLUOROSCOPY TIME:  Fluoroscopy Time:  0.6 minute
Radiation Exposure Index (if provided by the fluoroscopic device): 4
mGy
Number of Acquired Spot Images: 0

[Series 1: cp_standard · 0.25mm/px · 2 of 17 frames shown (1 of 9)]
[frame 3/17]
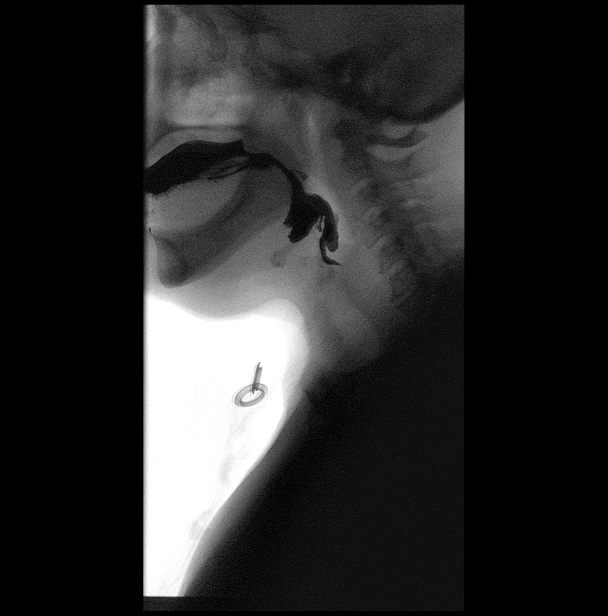
[frame 15/17]
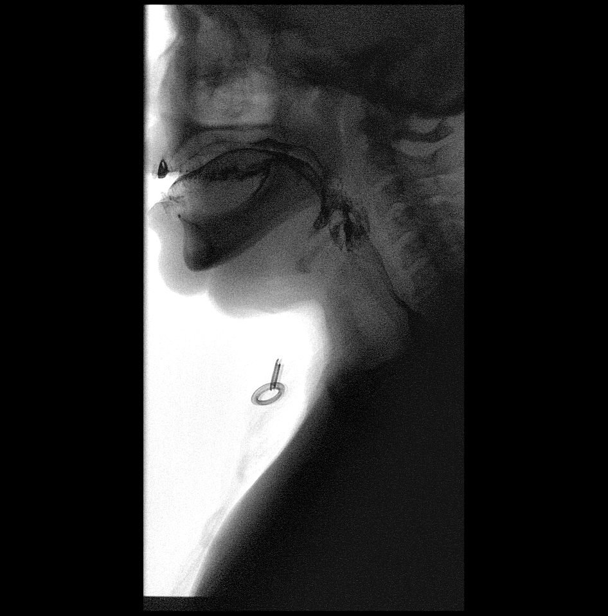

[Series 2: cp_standard · 0.26mm/px · 2 of 13 frames shown (2 of 9)]
[frame 3/13]
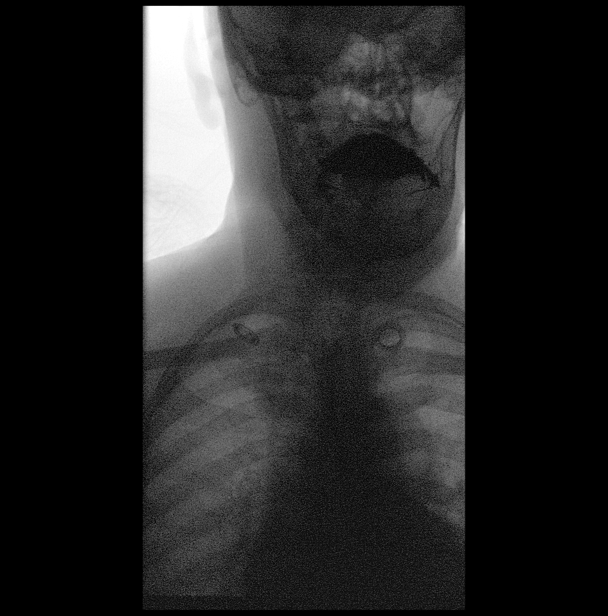
[frame 12/13]
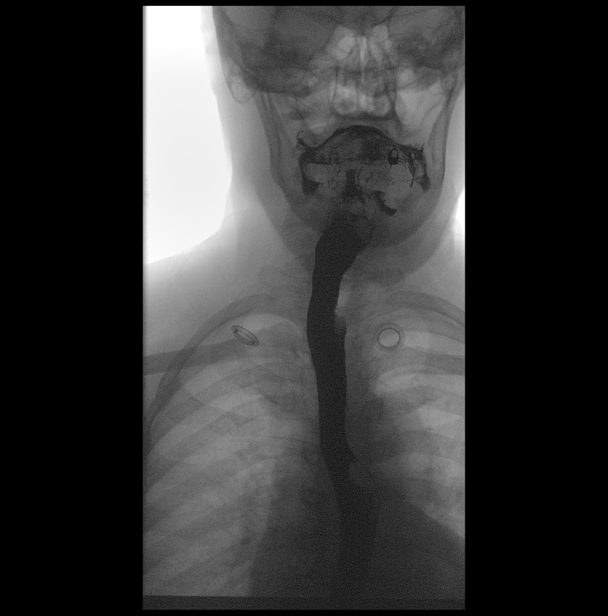

[Series 3: cp_standard · 0.26mm/px · 1 of 1 slices shown (3 of 9)]
[im 1/1]
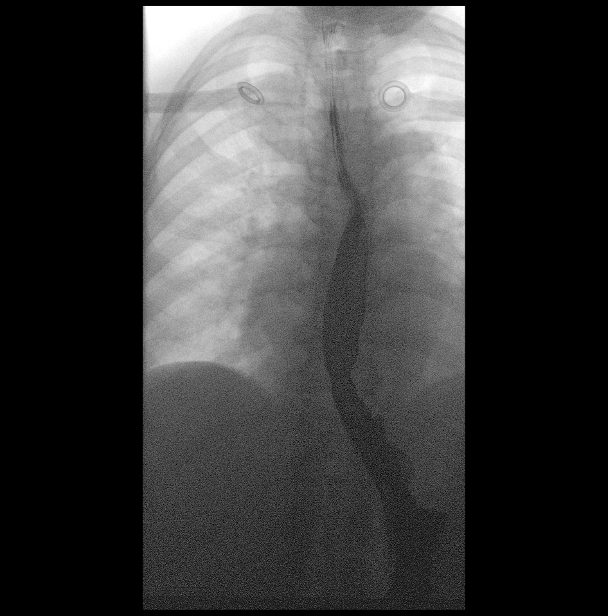

[Series 4: cp_standard · 0.26mm/px · 2 of 18 frames shown (4 of 9)]
[frame 10/18]
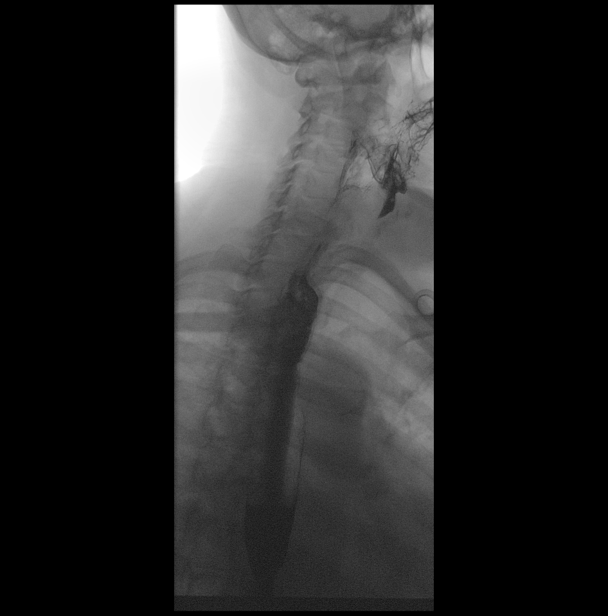
[frame 18/18]
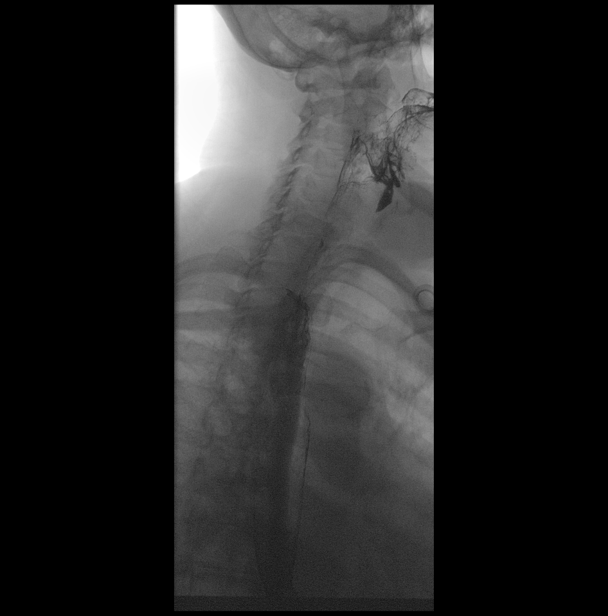

[Series 5: cp_standard · 0.26mm/px · 2 of 30 frames shown (5 of 9)]
[frame 3/30]
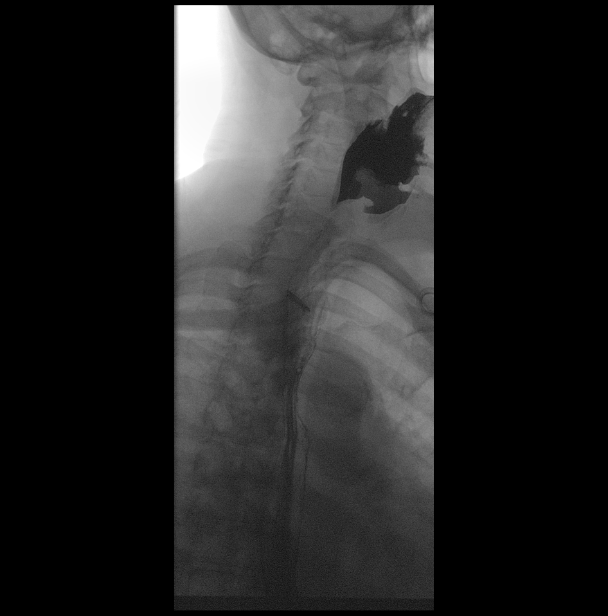
[frame 16/30]
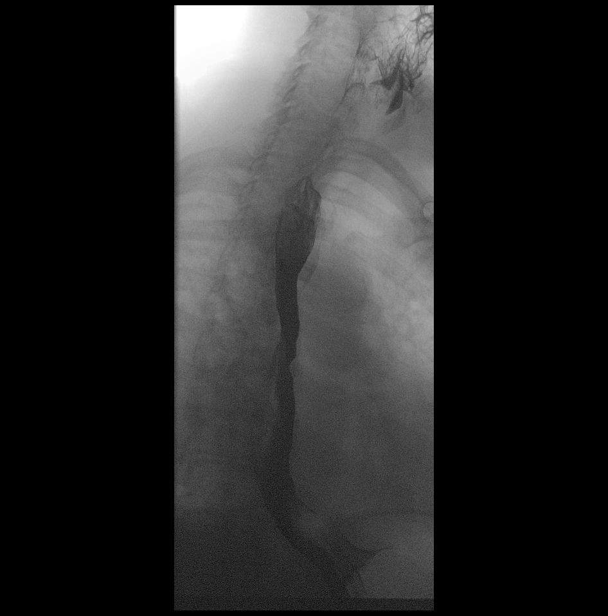

[Series 6: cp_standard · 0.26mm/px · 1 of 1 slices shown (6 of 9)]
[im 1/1]
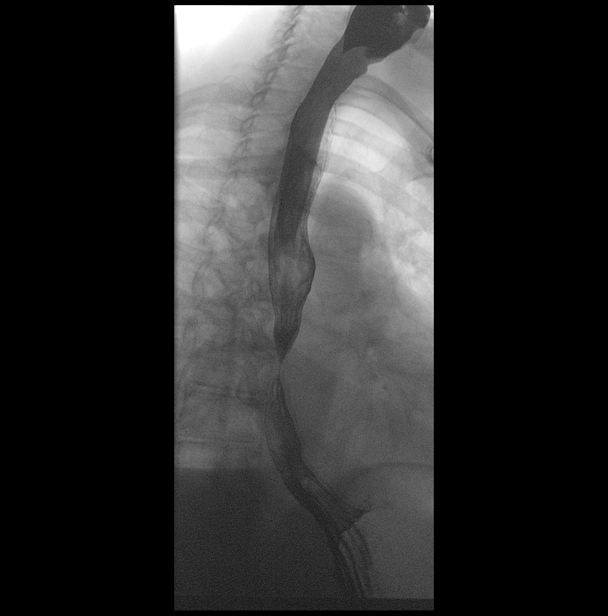

[Series 7: cp_standard · 0.26mm/px · 2 of 5 frames shown (7 of 9)]
[frame 3/5]
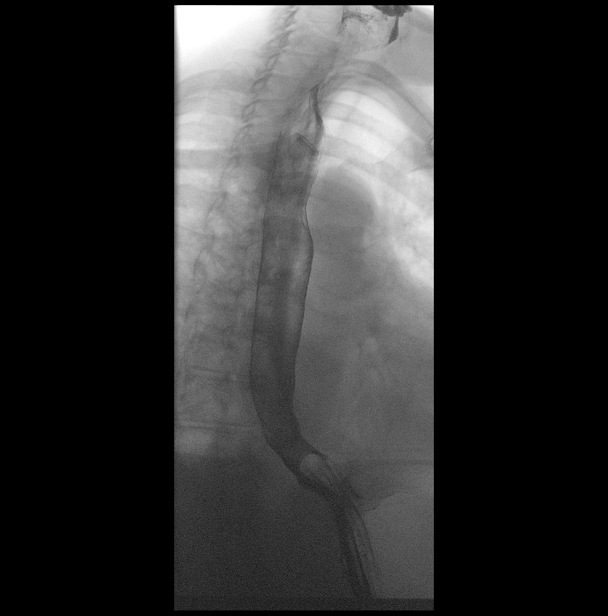
[frame 5/5]
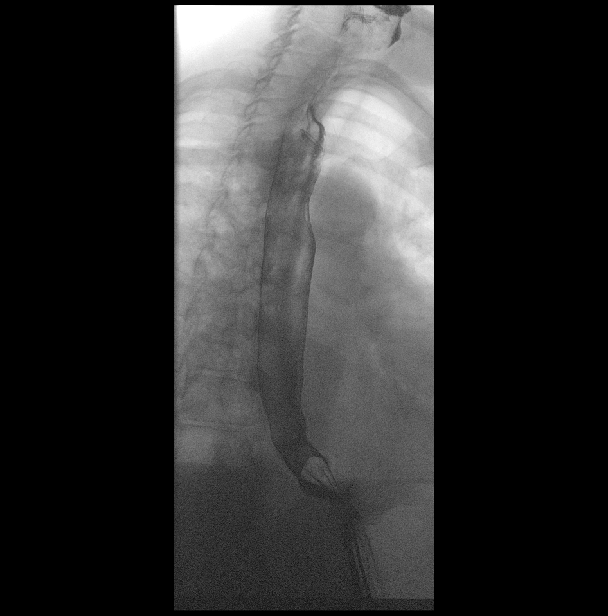

[Series 9: cp_standard · 0.26mm/px · 1 of 1 slices shown (8 of 9)]
[im 1/1]
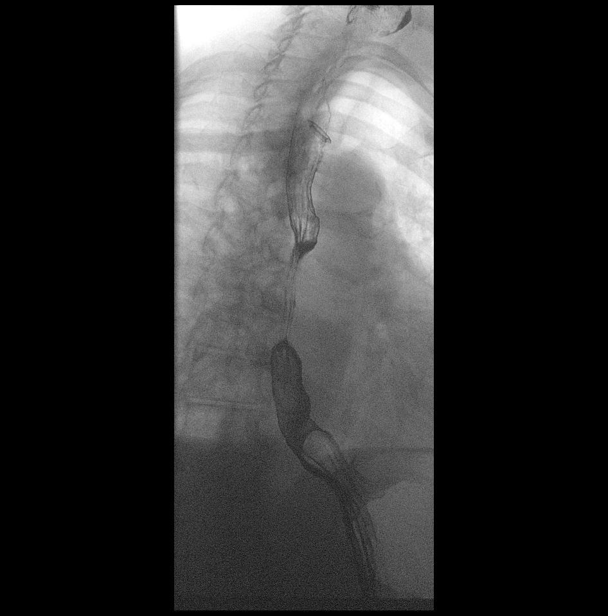

[Series 11: cp_standard · 0.26mm/px · 1 of 1 slices shown (9 of 9)]
[im 1/1]
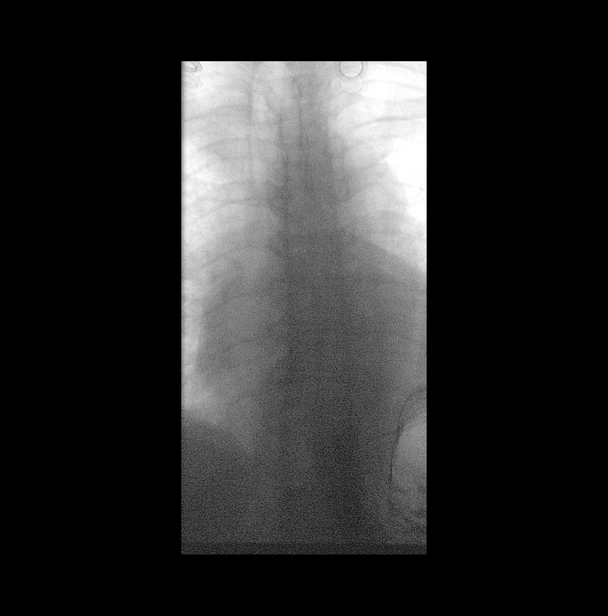

[14 of 24 positions shown; findings below may reference images not displayed]

FINDINGS: Normal pharyngeal anatomy and motility. Contrast flowed freely
through the esophagus without evidence of a stricture or mass.
Normal esophageal mucosa without evidence of irregularity or
ulceration. Esophageal motility was normal. Mild gastroesophageal
reflux. Small hiatal hernia.

At the end of the examination a 13 mm barium tablet was administered
which transited through the esophagus and esophagogastric junction
without delay.
IMPRESSION: Mild gastroesophageal reflux.  Small hiatal hernia.

No esophageal stricture.

## 2022-01-15 ENCOUNTER — Telehealth: Payer: Self-pay | Admitting: *Deleted

## 2022-01-15 NOTE — Telephone Encounter (Signed)
LMTC to schedule Yearly Lung CA CT Scan. 

## 2022-01-17 ENCOUNTER — Encounter: Payer: Self-pay | Admitting: *Deleted

## 2022-01-17 ENCOUNTER — Other Ambulatory Visit: Payer: Self-pay | Admitting: *Deleted

## 2022-01-17 DIAGNOSIS — Z87891 Personal history of nicotine dependence: Secondary | ICD-10-CM

## 2022-01-17 DIAGNOSIS — Z122 Encounter for screening for malignant neoplasm of respiratory organs: Secondary | ICD-10-CM

## 2022-01-17 DIAGNOSIS — F17210 Nicotine dependence, cigarettes, uncomplicated: Secondary | ICD-10-CM

## 2022-01-28 ENCOUNTER — Ambulatory Visit
Admission: RE | Admit: 2022-01-28 | Discharge: 2022-01-28 | Disposition: A | Discharge status: Home / Self Care | Payer: Medicare Other | Source: Ambulatory Visit | Attending: Acute Care | Admitting: Acute Care

## 2022-01-28 DIAGNOSIS — Z87891 Personal history of nicotine dependence: Secondary | ICD-10-CM | POA: Diagnosis present

## 2022-01-28 DIAGNOSIS — F17210 Nicotine dependence, cigarettes, uncomplicated: Secondary | ICD-10-CM | POA: Diagnosis present

## 2022-01-28 DIAGNOSIS — Z122 Encounter for screening for malignant neoplasm of respiratory organs: Secondary | ICD-10-CM | POA: Diagnosis present

## 2022-01-30 ENCOUNTER — Other Ambulatory Visit: Payer: Self-pay

## 2022-01-30 DIAGNOSIS — Z122 Encounter for screening for malignant neoplasm of respiratory organs: Secondary | ICD-10-CM

## 2022-01-30 DIAGNOSIS — F17210 Nicotine dependence, cigarettes, uncomplicated: Secondary | ICD-10-CM

## 2022-01-30 DIAGNOSIS — Z87891 Personal history of nicotine dependence: Secondary | ICD-10-CM

## 2022-02-11 ENCOUNTER — Telehealth: Payer: Self-pay | Admitting: *Deleted

## 2022-02-11 NOTE — Telephone Encounter (Signed)
"  I'm returning your call." ?

## 2022-02-14 ENCOUNTER — Ambulatory Visit (INDEPENDENT_AMBULATORY_CARE_PROVIDER_SITE_OTHER): Payer: Medicare Other | Admitting: Podiatry

## 2022-02-14 DIAGNOSIS — B351 Tinea unguium: Secondary | ICD-10-CM | POA: Diagnosis not present

## 2022-02-14 DIAGNOSIS — M79674 Pain in right toe(s): Secondary | ICD-10-CM

## 2022-02-14 DIAGNOSIS — M79675 Pain in left toe(s): Secondary | ICD-10-CM

## 2022-02-14 DIAGNOSIS — M7752 Other enthesopathy of left foot: Secondary | ICD-10-CM

## 2022-02-14 DIAGNOSIS — M7751 Other enthesopathy of right foot: Secondary | ICD-10-CM | POA: Diagnosis not present

## 2022-02-14 MED ORDER — BETAMETHASONE SOD PHOS & ACET 6 (3-3) MG/ML IJ SUSP
3.0000 mg | Freq: Once | INTRAMUSCULAR | Status: AC
  Administered 2022-02-14: 3 mg via INTRA_ARTICULAR

## 2022-02-14 NOTE — Progress Notes (Signed)
? ?  SUBJECTIVE Patient with a history of diabetes mellitus presents to office today complaining of elongated, thickened nails that cause pain while ambulating in shoes.  She is unable to trim her own nails.  Patient also has some chronic bilateral foot pain that has flared up over the last few months.  She is requesting injections to her bilateral ankles  Past Medical History:  Diagnosis Date   AF (atrial fibrillation) (HCC)    AKI (acute kidney injury) (HCC) 07/21/2019   Arthritis    Asthma    Chicken pox    Clotting disorder (HCC)    Depression    Diabetes mellitus without complication (HCC)    Diverticulosis    Dysrhythmia    GERD (gastroesophageal reflux disease)    Glaucoma    Hemorrhoids    Hemorrhoids, internal    HH (hiatus hernia)    History of hiatal hernia    History of kidney stones    Hypertension    Migraines    Neuropathy    Obesity    Opiate use 03/16/2017   Personal history of tobacco use, presenting hazards to health 06/18/2015   Sleep apnea    Tubular adenoma of colon     OBJECTIVE General Patient is awake, alert, and oriented x 3 and in no acute distress. Derm Skin is dry and supple bilateral. Negative open lesions or macerations. Remaining integument unremarkable. Nails are tender, long, thickened and dystrophic with subungual debris, consistent with onychomycosis, 1-5 bilateral. No signs of infection noted.  Hyperkeratotic callus tissue noted to the plantar aspect of the bilateral forefoot with sensitivity to palpation. Vasc  DP and PT pedal pulses palpable bilaterally. Temperature gradient within normal limits.  Neuro Epicritic and protective threshold sensation diminished bilaterally.  Musculoskeletal Exam No symptomatic pedal deformities noted bilateral. Muscular strength within normal limits.  Today there is some pain on palpation and with range of motion to the bilateral ankle joints lateral aspect.  ASSESSMENT 1. Diabetes Mellitus w/ peripheral  neuropathy 2. Onychomycosis of nail due to dermatophyte bilateral 3.  Preulcerative callus lesions bilateral feet  4.  Capsulitis bilateral ankles  PLAN OF CARE 1. Patient evaluated today. 2. Instructed to maintain good pedal hygiene and foot care. Stressed importance of controlling blood sugar.  3. Mechanical debridement of nails 1-5 bilaterally performed using a nail nipper. Filed with dremel without incident.  4.  Excisional debridement of the hyperkeratotic callus tissue was performed using a chisel blade without incident or bleeding  5.  Injection of 0.5 cc Celestone Soluspan injection of bilateral ankle joints  6.  Recommend good supportive sneakers return to clinic in 3 mos.     Louellen Haldeman M. Eria Lozoya, DPM [facility]  Dr. Angeni Chaudhuri M. Jafet Wissing, DPM    [address]                                   [place], [place] [address]                Office [phone]  Fax [phone]     

## 2022-03-13 NOTE — Telephone Encounter (Signed)
I couldn't verify who called the patient.

## 2022-04-16 ENCOUNTER — Telehealth: Payer: Self-pay | Admitting: *Deleted

## 2022-04-16 NOTE — Telephone Encounter (Signed)
I called her back.  She said she had gotten her dates wrong.  She said she found out when she came over this morning.  I informed her that her next appointment is scheduled for 06/17/2022.  She stated she is aware.

## 2022-04-16 NOTE — Telephone Encounter (Signed)
"  I have a 9:15 appointment with Dr. Amalia Hailey.  I'm going to be a little bit late because my ride is going to pick me up.  My car is in the shop.  Give me time to get there."

## 2022-05-28 DIAGNOSIS — R0602 Shortness of breath: Secondary | ICD-10-CM | POA: Insufficient documentation

## 2022-05-28 DIAGNOSIS — I2510 Atherosclerotic heart disease of native coronary artery without angina pectoris: Secondary | ICD-10-CM | POA: Insufficient documentation

## 2022-05-28 DIAGNOSIS — R9431 Abnormal electrocardiogram [ECG] [EKG]: Secondary | ICD-10-CM | POA: Insufficient documentation

## 2022-05-28 DIAGNOSIS — I700 Atherosclerosis of aorta: Secondary | ICD-10-CM | POA: Insufficient documentation

## 2022-05-28 DIAGNOSIS — I10 Essential (primary) hypertension: Secondary | ICD-10-CM | POA: Insufficient documentation

## 2022-06-06 DIAGNOSIS — I5189 Other ill-defined heart diseases: Secondary | ICD-10-CM

## 2022-06-06 HISTORY — DX: Other ill-defined heart diseases: I51.89

## 2022-06-17 ENCOUNTER — Ambulatory Visit (INDEPENDENT_AMBULATORY_CARE_PROVIDER_SITE_OTHER): Payer: Medicare Other | Admitting: Podiatry

## 2022-06-17 DIAGNOSIS — M79675 Pain in left toe(s): Secondary | ICD-10-CM

## 2022-06-17 DIAGNOSIS — M79674 Pain in right toe(s): Secondary | ICD-10-CM | POA: Diagnosis not present

## 2022-06-17 DIAGNOSIS — B351 Tinea unguium: Secondary | ICD-10-CM

## 2022-06-17 NOTE — Progress Notes (Signed)
   SUBJECTIVE Patient with a history of diabetes mellitus presents to office today complaining of elongated, thickened nails that cause pain while ambulating in shoes.  She is unable to trim her own nails.  Patient also has some chronic bilateral foot pain that has flared up over the last few months.  She is requesting injections to her bilateral ankles  Past Medical History:  Diagnosis Date   AF (atrial fibrillation) (HCC)    AKI (acute kidney injury) ([place]) 07/21/2019   Arthritis    Asthma    Chicken pox    Clotting disorder (HCC)    Depression    Diabetes mellitus without complication (HCC)    Diverticulosis    Dysrhythmia    GERD (gastroesophageal reflux disease)    Glaucoma    Hemorrhoids    Hemorrhoids, internal    HH (hiatus hernia)    History of hiatal hernia    History of kidney stones    Hypertension    Migraines    Neuropathy    Obesity    Opiate use 03/16/2017   Personal history of tobacco use, presenting hazards to health 06/18/2015   Sleep apnea    Tubular adenoma of colon     OBJECTIVE General Patient is awake, alert, and oriented x 3 and in no acute distress. Derm Skin is dry and supple bilateral. Negative open lesions or macerations. Remaining integument unremarkable. Nails are tender, long, thickened and dystrophic with subungual debris, consistent with onychomycosis, 1-5 bilateral. No signs of infection noted.  Hyperkeratotic callus tissue noted to the plantar aspect of the bilateral forefoot with sensitivity to palpation. Vasc  DP and PT pedal pulses palpable bilaterally. Temperature gradient within normal limits.  Neuro Epicritic and protective threshold sensation diminished bilaterally.  Musculoskeletal Exam No symptomatic pedal deformities noted bilateral. Muscular strength within normal limits.  Today there is some pain on palpation and with range of motion to the bilateral ankle joints lateral aspect.  ASSESSMENT 1. Diabetes Mellitus w/ peripheral  neuropathy 2. Onychomycosis of nail due to dermatophyte bilateral 3.  Preulcerative callus lesions bilateral feet  4.  Capsulitis bilateral ankles  PLAN OF CARE 1. Patient evaluated today. 2. Instructed to maintain good pedal hygiene and foot care. Stressed importance of controlling blood sugar.  3. Mechanical debridement of nails 1-5 bilaterally performed using a nail nipper. Filed with dremel without incident.  4.  Excisional debridement of the hyperkeratotic callus tissue was performed using a chisel blade without incident or bleeding  5.  Injection of 0.5 cc Celestone Soluspan injection of bilateral ankle joints  6.  Recommend good supportive sneakers return to clinic in 3 mos.     Edrick Kins, DPM [facility]  Dr. Edrick Kins, DPM    [address], [place] [address]                Office [phone]  Fax [phone]

## 2022-07-31 MED FILL — Iron Sucrose Inj 20 MG/ML (Fe Equiv): INTRAVENOUS | Qty: 10 | Status: AC

## 2022-08-01 ENCOUNTER — Inpatient Hospital Stay: Payer: Medicare Other | Attending: Internal Medicine

## 2022-08-01 ENCOUNTER — Inpatient Hospital Stay: Payer: Medicare Other

## 2022-08-01 ENCOUNTER — Inpatient Hospital Stay (HOSPITAL_BASED_OUTPATIENT_CLINIC_OR_DEPARTMENT_OTHER): Payer: Medicare Other | Admitting: Internal Medicine

## 2022-08-01 ENCOUNTER — Encounter: Payer: Self-pay | Admitting: Internal Medicine

## 2022-08-01 DIAGNOSIS — E669 Obesity, unspecified: Secondary | ICD-10-CM | POA: Diagnosis not present

## 2022-08-01 DIAGNOSIS — R5383 Other fatigue: Secondary | ICD-10-CM | POA: Insufficient documentation

## 2022-08-01 DIAGNOSIS — F17210 Nicotine dependence, cigarettes, uncomplicated: Secondary | ICD-10-CM | POA: Insufficient documentation

## 2022-08-01 DIAGNOSIS — D509 Iron deficiency anemia, unspecified: Secondary | ICD-10-CM | POA: Insufficient documentation

## 2022-08-01 DIAGNOSIS — D500 Iron deficiency anemia secondary to blood loss (chronic): Secondary | ICD-10-CM

## 2022-08-01 LAB — CBC WITH DIFFERENTIAL/PLATELET
Abs Immature Granulocytes: 0.02 10*3/uL (ref 0.00–0.07)
Basophils Absolute: 0 10*3/uL (ref 0.0–0.1)
Basophils Relative: 0 %
Eosinophils Absolute: 0.2 10*3/uL (ref 0.0–0.5)
Eosinophils Relative: 3 %
HCT: 37.6 % (ref 36.0–46.0)
Hemoglobin: 11.8 g/dL — ABNORMAL LOW (ref 12.0–15.0)
Immature Granulocytes: 0 %
Lymphocytes Relative: 33 %
Lymphs Abs: 2.4 10*3/uL (ref 0.7–4.0)
MCH: 26.4 pg (ref 26.0–34.0)
MCHC: 31.4 g/dL (ref 30.0–36.0)
MCV: 84.1 fL (ref 80.0–100.0)
Monocytes Absolute: 0.6 10*3/uL (ref 0.1–1.0)
Monocytes Relative: 9 %
Neutro Abs: 4 10*3/uL (ref 1.7–7.7)
Neutrophils Relative %: 55 %
Platelets: 252 10*3/uL (ref 150–400)
RBC: 4.47 MIL/uL (ref 3.87–5.11)
RDW: 14.2 % (ref 11.5–15.5)
WBC: 7.2 10*3/uL (ref 4.0–10.5)
nRBC: 0 % (ref 0.0–0.2)

## 2022-08-01 LAB — IRON AND TIBC
Iron: 30 ug/dL (ref 28–170)
Saturation Ratios: 7 % — ABNORMAL LOW (ref 10.4–31.8)
TIBC: 405 ug/dL (ref 250–450)
UIBC: 375 ug/dL

## 2022-08-01 LAB — COMPREHENSIVE METABOLIC PANEL
ALT: 17 U/L (ref 0–44)
AST: 22 U/L (ref 15–41)
Albumin: 3.7 g/dL (ref 3.5–5.0)
Alkaline Phosphatase: 69 U/L (ref 38–126)
Anion gap: 9 (ref 5–15)
BUN: 15 mg/dL (ref 8–23)
CO2: 27 mmol/L (ref 22–32)
Calcium: 9.9 mg/dL (ref 8.9–10.3)
Chloride: 103 mmol/L (ref 98–111)
Creatinine, Ser: 0.73 mg/dL (ref 0.44–1.00)
GFR, Estimated: >60 mL/min (ref >60)
Glucose, Bld: 92 mg/dL (ref 70–99)
Potassium: 4.1 mmol/L (ref 3.5–5.1)
Sodium: 139 mmol/L (ref 135–145)
Total Bilirubin: 0.8 mg/dL (ref 0.3–1.2)
Total Protein: 7.6 g/dL (ref 6.5–8.1)

## 2022-08-01 LAB — FERRITIN: Ferritin: 14 ng/mL (ref 11–307)

## 2022-08-01 NOTE — Progress Notes (Signed)
Patient states no concerns at the moment. 

## 2022-08-01 NOTE — Progress Notes (Signed)
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, ENDOSCOPIC RESECTION:  - WELL-DIFFERENTIATED NEUROENDOCRINE TUMOR.; Tumor size: 0.6 cm  Tumor focality: single focus; Histologic type: well differentiated neuroendocrine tumor  Histologic grade: grade 1; Mitotic rate: < 2 mitosis in 10 hpf and < 3% KI-67 index   #Chronic mild anemia microcytic-baseline-11-12; June September 2020-9; microcytic; [multiple prior EGD/colonoscopies; [facility]]; no bone marrow biopsy; October 2020 EGD colonoscopy-no acute bleeding etiology [Dr.Toledo]  # DM/active smoker/remote history of TIA   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  Walks with a rolling walker.  Accompanied by family.  Lynn Potter 76 y.o.  female is here for follow-up of anemia-iron deficiency question etiology is here for follow-up.  Patient states no concerns at the moment.  Patient denies any blood in stools or black-colored stools.  She is compliant with oral iron pills.  Review of Systems  Constitutional:  Positive for malaise/fatigue. Negative for chills, diaphoresis, fever and weight loss.  HENT:  Negative for nosebleeds and sore throat.   Eyes:  Negative for double vision.  Respiratory:  Negative for cough, hemoptysis, sputum production, shortness of breath and wheezing.   Cardiovascular:  Negative for chest pain, palpitations, orthopnea and leg swelling.  Gastrointestinal:  Negative for abdominal pain, blood in stool, constipation, diarrhea, heartburn, melena, nausea and vomiting.  Genitourinary:  Negative for dysuria, frequency and urgency.  Musculoskeletal:  Positive for back pain and joint pain.  Skin: Negative.   Negative for itching and rash.  Neurological:  Negative for dizziness, tingling, focal weakness, weakness and headaches.  Endo/Heme/Allergies:  Does not bruise/bleed easily.  Psychiatric/Behavioral:  Negative for depression. The patient is not nervous/anxious and does not have insomnia.     MEDICAL HISTORY:  Past Medical History:  Diagnosis Date   AF (atrial fibrillation) (HCC)    AKI (acute kidney injury) ([place]) 07/21/2019   Arthritis    Asthma    Chicken pox    Clotting disorder (HCC)    Depression    Diabetes mellitus without complication (HCC)    Diverticulosis    Dysrhythmia    GERD (gastroesophageal reflux disease)    Glaucoma    Hemorrhoids    Hemorrhoids, internal    HH (hiatus hernia)    History of hiatal hernia    History of kidney stones    Hypertension    Migraines    Neuropathy    Obesity    Opiate use 03/16/2017   Personal history of tobacco use, presenting hazards to health 06/18/2015   Sleep apnea    Tubular adenoma of colon     SURGICAL HISTORY: Past Surgical History:  Procedure Laterality Date   ABDOMINAL HYSTERECTOMY     BACK SURGERY     CARPAL TUNNEL RELEASE     carpal tunnell     CATARACT EXTRACTION     CHOLECYSTECTOMY     COLONOSCOPY WITH ESOPHAGOGASTRODUODENOSCOPY (EGD)     COLONOSCOPY WITH PROPOFOL N/A 08/03/2019   Procedure: COLONOSCOPY WITH PROPOFOL;  Surgeon: Toledo, Benay Pike, MD;  Location: [facility] ENDOSCOPY;  Service: Gastroenterology;  Laterality: N/A;   ESOPHAGOGASTRODUODENOSCOPY (EGD) WITH PROPOFOL N/A 02/16/2017   Procedure: ESOPHAGOGASTRODUODENOSCOPY (EGD) WITH PROPOFOL;  Surgeon: Manya Silvas, MD;  Location: [facility] ENDOSCOPY;  Service: Endoscopy;  Laterality: N/A;   ESOPHAGOGASTRODUODENOSCOPY (EGD) WITH PROPOFOL N/A 08/03/2019   Procedure: ESOPHAGOGASTRODUODENOSCOPY (EGD) WITH PROPOFOL;  Surgeon: Toledo, Benay Pike, MD;  Location: [facility] ENDOSCOPY;  Service: Gastroenterology;  Laterality: N/A;   EYE SURGERY     JOINT REPLACEMENT  Bilateral 1999   TOTAL KNEE   LUMBAR FUSION     REPAIR ROTATOR CUFF TEAR     TOTAL KNEE ARTHROPLASTY     UPPER ESOPHAGEAL ENDOSCOPIC ULTRASOUND (EUS) N/A 01/03/2016   Procedure: UPPER ESOPHAGEAL ENDOSCOPIC ULTRASOUND (EUS);  Surgeon: Holly Bodily, MD;  Location: [facility] ENDOSCOPY;  Service: Gastroenterology;  Laterality: N/A;    SOCIAL HISTORY: Social History   Socioeconomic History   Marital status: Single    Spouse name: Not on file   Number of children: Not on file   Years of education: Not on file   Highest education level: Not on file  Occupational History   Not on file  Tobacco Use   Smoking status: Every Day    Packs/day: 1.00    Types: Cigarettes   Smokeless tobacco: Never  Vaping Use   Vaping Use: Never used  Substance and Sexual Activity   Alcohol use: No   Drug use: No   Sexual activity: Not on file  Other Topics Concern   Not on file  Social History Narrative   Lives in [place], lives alone. Rolling walker sec to back/hip pain. 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 Smoke ~1ppd/>50; No alcohol.    Social Determinants of Health   Financial Resource Strain: Not on file  Food Insecurity: Not on file  Transportation Needs: Not on file  Physical Activity: Not on file  Stress: Not on file  Social Connections: Not on file  Intimate Partner Violence: Not on file    FAMILY HISTORY: Family History  Problem Relation Age of Onset   Diabetes Mother    Diabetes Sister     ALLERGIES:  has No Known Allergies.  MEDICATIONS:  Current Outpatient Medications  Medication Sig Dispense Refill   amLODipine (NORVASC) 10 MG tablet Take 10 mg by mouth daily.     Ascorbic Acid (VITAMIN C) 1000 MG tablet Take 1,000 mg by mouth daily.     aspirin 81 MG tablet Take 81 mg by mouth daily.     atorvastatin (LIPITOR) 20 MG tablet Take 20 mg by mouth daily.     Cholecalciferol 125 MCG (5000 UT) TABS Take 5,000 Units by mouth daily.      ferrous sulfate 325 (65 FE) MG EC tablet Take 325  mg by mouth 2 (two) times daily.     folic acid (FOLVITE) 1 MG tablet Take 1 mg by mouth daily.     insulin lispro (HUMALOG) 100 UNIT/ML KwikPen Inject 3 Units into the skin 3 (three) times daily.      JARDIANCE 25 MG TABS tablet Take 25 mg by mouth daily.      latanoprost (XALATAN) 0.005 % ophthalmic solution Place 1 drop into both eyes at bedtime.      LEVEMIR FLEXTOUCH 100 UNIT/ML Pen Inject 25 Units into the skin daily. Patient reports she only takes Levemir if her glucose is >150 mg/dl which is rare for her  5   metFORMIN (GLUCOPHAGE) 500 MG tablet Take 500 mg by mouth 2 (two) times daily with a meal.     olmesartan-hydrochlorothiazide (BENICAR HCT) 40-25 MG tablet Take 1 tablet by mouth daily.     Omega-3 Fatty Acids (FISH OIL) 1000  MG CAPS Take by mouth daily.     rOPINIRole (REQUIP) 1 MG tablet TAKE 1 TABLET BY MOUTH EVERYDAY AT BEDTIME     traZODone (DESYREL) 50 MG tablet      vitamin B-12 1000 MCG tablet Take 1 tablet (1,000 mcg total) by mouth daily. 30 tablet 1   atorvastatin (LIPITOR) 40 MG tablet Take 1 tablet (40 mg total) by mouth daily. 30 tablet 0   COMBIVENT RESPIMAT 20-100 MCG/ACT AERS respimat USE 1 VIAL VIA NEBULIZER 4 TIMES A DAY AS NEEDED     hydrOXYzine (VISTARIL) 25 MG capsule Take 25 mg by mouth every 12 (twelve) hours as needed. (Patient not taking: Reported on 08/01/2022)     pantoprazole (PROTONIX) 40 MG tablet Take 1 tablet by mouth 2 (two) times daily.  (Patient not taking: Reported on 08/01/2022)     pregabalin (LYRICA) 150 MG capsule Take 150 mg by mouth 2 (two) times daily.  (Patient not taking: Reported on 08/01/2022)     No current facility-administered medications for this visit.      PHYSICAL EXAMINATION:   Vitals:   08/01/22 1332  BP: (!) 146/68  Pulse: 74  Resp: 20  Temp: (!) 96.6 F (35.9 C)  SpO2: 100%   Filed Weights   08/01/22 1332  Weight: 192 lb 9.6 oz (87.4 kg)    Physical Exam HENT:     Head: Normocephalic and atraumatic.      Mouth/Throat:     Pharynx: No oropharyngeal exudate.  Eyes:     Pupils: Pupils are equal, round, and reactive to light.  Cardiovascular:     Rate and Rhythm: Normal rate and regular rhythm.  Pulmonary:     Effort: No respiratory distress.     Breath sounds: No wheezing.  Abdominal:     General: Bowel sounds are normal. There is no distension.     Palpations: Abdomen is soft. There is no mass.     Tenderness: There is no abdominal tenderness. There is no guarding or rebound.  Musculoskeletal:        General: No tenderness. Normal range of motion.     Cervical back: Normal range of motion and neck supple.  Skin:    General: Skin is warm.  Neurological:     Mental Status: She is alert and oriented to person, place, and time.  Psychiatric:        Mood and Affect: Affect normal.     LABORATORY DATA:  I have reviewed the data as listed Lab Results  Component Value Date   WBC 7.2 08/01/2022   HGB 11.8 (L) 08/01/2022   HCT 37.6 08/01/2022   MCV 84.1 08/01/2022   PLT 252 08/01/2022   Recent Labs    09/06/21 1431 08/01/22 1304  NA 134* 139  K 4.1 4.1  CL 99 103  CO2 28 27  GLUCOSE 116* 92  BUN 9 15  CREATININE 0.84 0.73  CALCIUM 9.5 9.9  GFRNONAA >60 >60  PROT  --  7.6  ALBUMIN  --  3.7  AST  --  22  ALT  --  17  ALKPHOS  --  69  BILITOT  --  0.8     No results found.   Microcytic anemia #Iron deficiency anemia-currently on p.o. iron.   . Hemoglobin today is 11 - DECLINES Venofer infusion.Continue p.o. iron; but BID.   #Etiology of iron deficiency-unclear.  October 2020-EGD colonoscopy no obvious etiology of bleeding noted.   # NET remote history-stage  I.  Low risk of recurrence.  S/p bowel re-section; clinically no dense of recurrence.  STABLE  # Smoking: counseled  quitting smoking; APRIL- 2023- lung cancer screeing-NEGATIVE; continue surveillance imaging per PCP.   # DISPOSITION:  # no venofer # follow up in 12 months - MD; labs- cbc/cmp; iron  stuides.ferritin; possible venofer Dr.B   All questions were answered. The patient knows to call the clinic with any problems, questions or concerns.    Cammie Sickle, MD 08/01/2022 1:54 PM

## 2022-08-01 NOTE — Assessment & Plan Note (Addendum)
#  Iron deficiency anemia-currently on p.o. iron.   . Hemoglobin today is 11 - DECLINES Venofer infusion.Continue p.o. iron; but BID.   #Etiology of iron deficiency-unclear.  October 2020-EGD colonoscopy no obvious etiology of bleeding noted.   # NET remote history-stage I.  Low risk of recurrence.  S/p bowel re-section; clinically no dense of recurrence.  STABLE  # Smoking: counseled  quitting smoking; APRIL- 2023- lung cancer screeing-NEGATIVE; continue surveillance imaging per PCP.   # DISPOSITION:  # no venofer # follow up in 12 months - MD; labs- cbc/cmp; iron stuides.ferritin; possible venofer Dr.B

## 2022-09-16 ENCOUNTER — Ambulatory Visit: Payer: Medicare Other | Admitting: Podiatry

## 2022-09-23 ENCOUNTER — Ambulatory Visit: Payer: Medicare Other | Admitting: Podiatry

## 2022-10-14 ENCOUNTER — Ambulatory Visit: Payer: Medicare Other | Admitting: Podiatry

## 2022-10-21 ENCOUNTER — Ambulatory Visit (INDEPENDENT_AMBULATORY_CARE_PROVIDER_SITE_OTHER): Payer: Medicare Other | Admitting: Podiatry

## 2022-10-21 DIAGNOSIS — B351 Tinea unguium: Secondary | ICD-10-CM | POA: Diagnosis not present

## 2022-10-21 DIAGNOSIS — M79674 Pain in right toe(s): Secondary | ICD-10-CM | POA: Diagnosis not present

## 2022-10-21 DIAGNOSIS — M79675 Pain in left toe(s): Secondary | ICD-10-CM

## 2022-10-21 NOTE — Progress Notes (Signed)
   Chief Complaint  Patient presents with   Diabetes    Diabetic foot care, A1c-6.5 BG-127    SUBJECTIVE Patient with a history of diabetes mellitus presents to office today complaining of elongated, thickened nails that cause pain while ambulating in shoes.  Patient is unable to trim their own nails. Patient is here for further evaluation and treatment.  Past Medical History:  Diagnosis Date   AF (atrial fibrillation) (HCC)    AKI (acute kidney injury) ([place]) 07/21/2019   Arthritis    Asthma    Chicken pox    Clotting disorder (HCC)    Depression    Diabetes mellitus without complication (HCC)    Diverticulosis    Dysrhythmia    GERD (gastroesophageal reflux disease)    Glaucoma    Hemorrhoids    Hemorrhoids, internal    HH (hiatus hernia)    History of hiatal hernia    History of kidney stones    Hypertension    Migraines    Neuropathy    Obesity    Opiate use 03/16/2017   Personal history of tobacco use, presenting hazards to health 06/18/2015   Sleep apnea    Tubular adenoma of colon     No Known Allergies   OBJECTIVE General Patient is awake, alert, and oriented x 3 and in no acute distress. Derm Skin is dry and supple bilateral. Negative open lesions or macerations. Remaining integument unremarkable. Nails are tender, long, thickened and dystrophic with subungual debris, consistent with onychomycosis, 1-5 bilateral. No signs of infection noted. Vasc  DP and PT pedal pulses palpable bilaterally. Temperature gradient within normal limits.  Neuro Epicritic and protective threshold sensation diminished bilaterally.  Musculoskeletal Exam No symptomatic pedal deformities noted bilateral. Muscular strength within normal limits.  ASSESSMENT 1. Diabetes Mellitus w/ peripheral neuropathy 2.  Pain due to onychomycosis of toenails bilateral  PLAN OF CARE 1. Patient evaluated today. 2. Instructed to maintain good pedal hygiene and foot care. Stressed importance of  controlling blood sugar.  3. Mechanical debridement of nails 1-5 bilaterally performed using a nail nipper. Filed with dremel without incident.  4. Return to clinic in 3 mos.     Edrick Kins, DPM [facility]  Dr. Edrick Kins, DPM    [address], [place] [address]                Office [phone]  Fax [phone]

## 2022-12-21 ENCOUNTER — Encounter: Payer: Self-pay | Admitting: Internal Medicine

## 2022-12-28 ENCOUNTER — Encounter: Payer: Self-pay | Admitting: Internal Medicine

## 2023-01-01 ENCOUNTER — Encounter: Payer: Self-pay | Admitting: Family Medicine

## 2023-01-05 ENCOUNTER — Encounter: Payer: Self-pay | Admitting: Internal Medicine

## 2023-01-20 ENCOUNTER — Encounter: Payer: Self-pay | Admitting: Podiatry

## 2023-01-20 ENCOUNTER — Ambulatory Visit (INDEPENDENT_AMBULATORY_CARE_PROVIDER_SITE_OTHER): Payer: 59 | Admitting: Podiatry

## 2023-01-20 VITALS — BP 138/88 | HR 87

## 2023-01-20 DIAGNOSIS — E119 Type 2 diabetes mellitus without complications: Secondary | ICD-10-CM | POA: Diagnosis not present

## 2023-01-20 DIAGNOSIS — G5761 Lesion of plantar nerve, right lower limb: Secondary | ICD-10-CM | POA: Diagnosis not present

## 2023-01-20 DIAGNOSIS — G5762 Lesion of plantar nerve, left lower limb: Secondary | ICD-10-CM

## 2023-01-20 DIAGNOSIS — M79674 Pain in right toe(s): Secondary | ICD-10-CM

## 2023-01-20 DIAGNOSIS — M79675 Pain in left toe(s): Secondary | ICD-10-CM | POA: Diagnosis not present

## 2023-01-20 DIAGNOSIS — B351 Tinea unguium: Secondary | ICD-10-CM

## 2023-01-20 MED ORDER — BETAMETHASONE SOD PHOS & ACET 6 (3-3) MG/ML IJ SUSP: 3.0000 mg | Freq: Once | INTRAMUSCULAR | Status: DC

## 2023-01-20 NOTE — Progress Notes (Signed)
   Chief Complaint  Patient presents with   Nail Problem    "Cut these toenails."    SUBJECTIVE Patient PMHx diabetes mellitus presents to office today complaining of elongated, thickened nails that cause pain while ambulating in shoes.  Patient is unable to trim their own nails.  Patient has also been complaining of increased pain and tenderness associated to the bilateral forefoot.  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  She says that the majority of her pain is localized between the third and fourth digits of the toes bilaterally.  She currently takes gabapentin with minimal relief.  Patient is here for further evaluation and treatment.  Past Medical History:  Diagnosis Date   AF (atrial fibrillation)    AKI (acute kidney injury) 07/21/2019   Arthritis    Asthma    Chicken pox    Clotting disorder    Depression    Diabetes mellitus without complication    Diverticulosis    Dysrhythmia    GERD (gastroesophageal reflux disease)    Glaucoma    Hemorrhoids    Hemorrhoids, internal    HH (hiatus hernia)    History of hiatal hernia    History of kidney stones    Hypertension    Migraines    Neuropathy    Obesity    Opiate use 03/16/2017   Personal history of tobacco use, presenting hazards to health 06/18/2015   Sleep apnea    Tubular adenoma of colon     No Known Allergies   OBJECTIVE General Patient is awake, alert, and oriented x 3 and in no acute distress. Derm Skin is dry and supple bilateral. Negative open lesions or macerations. Remaining integument unremarkable. Nails are tender, long, thickened and dystrophic with subungual debris, consistent with onychomycosis, 1-5 bilateral. No signs of infection noted. Vasc  DP and PT pedal pulses palpable bilaterally. Temperature gradient within normal limits.  Neuro Epicritic and protective threshold sensation diminished bilaterally.  Musculoskeletal Exam No symptomatic pedal deformities noted  bilateral. Muscular strength within normal limits.  There is tenderness and pain with palpation between the third and fourth intermetatarsal spaces bilaterally possibly consistent with Morton's neuroma complicated by peripheral polyneuropathy  ASSESSMENT 1. Diabetes Mellitus w/ peripheral neuropathy 2.  Pain due to onychomycosis of toenails bilateral 3.  Morton's neuroma third intermetatarsal space bilateral 4.  Encounter for diabetic foot exam  PLAN OF CARE 1. Patient evaluated today.  Comprehensive diabetic foot exam performed today 2. Instructed to maintain good pedal hygiene and foot care. Stressed importance of controlling blood sugar.  3. Mechanical debridement of nails 1-5 bilaterally performed using a nail nipper. Filed with dremel without incident.  4.  Injection of 0.5 cc Celestone Soluspan injected into the third intermetatarsal space bilateral 5.  Return to clinic in 3 mos.     Felecia Shelling, DPM [facility]  Dr. Felecia Shelling, DPM    [address] [place], [place] [address]                Office [phone]  Fax [phone]

## 2023-01-29 ENCOUNTER — Encounter: Payer: Self-pay | Admitting: Internal Medicine

## 2023-01-30 ENCOUNTER — Ambulatory Visit
Admission: RE | Admit: 2023-01-30 | Discharge: 2023-01-30 | Disposition: A | Discharge status: Home / Self Care | Payer: 59 | Source: Ambulatory Visit | Attending: Family Medicine | Admitting: Family Medicine

## 2023-01-30 DIAGNOSIS — Z122 Encounter for screening for malignant neoplasm of respiratory organs: Secondary | ICD-10-CM | POA: Insufficient documentation

## 2023-01-30 DIAGNOSIS — F17210 Nicotine dependence, cigarettes, uncomplicated: Secondary | ICD-10-CM | POA: Insufficient documentation

## 2023-01-30 DIAGNOSIS — K760 Fatty (change of) liver, not elsewhere classified: Secondary | ICD-10-CM | POA: Insufficient documentation

## 2023-01-30 DIAGNOSIS — Z87891 Personal history of nicotine dependence: Secondary | ICD-10-CM | POA: Diagnosis present

## 2023-03-15 ENCOUNTER — Other Ambulatory Visit: Payer: Self-pay

## 2023-03-15 ENCOUNTER — Emergency Department
Admission: EM | Admit: 2023-03-15 | Discharge: 2023-03-15 | Disposition: A | Discharge status: Home / Self Care | Payer: 59 | Attending: Emergency Medicine | Admitting: Emergency Medicine

## 2023-03-15 ENCOUNTER — Emergency Department: Payer: 59

## 2023-03-15 DIAGNOSIS — S0990XA Unspecified injury of head, initial encounter: Secondary | ICD-10-CM

## 2023-03-15 DIAGNOSIS — S0001XA Abrasion of scalp, initial encounter: Secondary | ICD-10-CM | POA: Diagnosis not present

## 2023-03-15 DIAGNOSIS — W19XXXA Unspecified fall, initial encounter: Secondary | ICD-10-CM

## 2023-03-15 DIAGNOSIS — W050XXA Fall from non-moving wheelchair, initial encounter: Secondary | ICD-10-CM | POA: Insufficient documentation

## 2023-03-15 MED ORDER — ACETAMINOPHEN 325 MG PO TABS
650.0000 mg | ORAL_TABLET | Freq: Once | ORAL | Status: AC
  Administered 2023-03-15: 650 mg via ORAL
  Filled 2023-03-15: qty 2

## 2023-03-15 NOTE — ED Triage Notes (Addendum)
Pt presents to the ED due to a fall. Pt states her wheelchair got caught in a drain and fell backwards in a parking lot. Pt states tetanus was greater than 10 years. Bleeding controlled at this moment. Pt placed in c collar in triage. Pt A&Ox4

## 2023-03-15 NOTE — ED Provider Notes (Signed)
[facility] Provider Note  Patient Contact: 6:36 PM (approximate)   History   Fall   HPI  Lynn Potter is a 77 y.o. female who presents the emergency department complaining of a fall.  Patient was in a wheelchair, fell backwards and hit her head on the ground.  No loss of consciousness.  Patient did have some bleeding from the posterior scalp.  No subsequent loss consciousness but she is complaining of headache and neck pain.  No other musculoskeletal injury or complaint is reported.  Patient denies any visual acuity changes, slurred speech, unilateral weakness.     Physical Exam   Triage Vital Signs: ED Triage Vitals [03/15/23 1601]  Enc Vitals Group     BP 119/84     Pulse Rate 81     Resp 18     Temp 98.6 F (37 C)     Temp Source Oral     SpO2 100 %     Weight 190 lb (86.2 kg)     Height      Head Circumference      Peak Flow      Pain Score 10     Pain Loc      Pain Edu?      Excl. in GC?     Most recent vital signs: Vitals:   03/15/23 1601  BP: 119/84  Pulse: 81  Resp: 18  Temp: 98.6 F (37 C)  SpO2: 100%     General: Alert and in no acute distress. Eyes:  PERRL. EOMI. Head: Superficial abrasions noted to the occipital skull without frank laceration.  No active bleeding.  Area is tender to palpation.  Patient has no palpable abnormality or crepitus.  No battle signs, raccoon eyes, serosanguineous fluid drainage from the ears or nares.  Neck: No stridor.  Diffuse cervical spine tenderness to palpation without palpable abnormality or step-off.  Pulses sensation intact and equal bilateral upper extremities..  Cardiovascular:  Good peripheral perfusion Respiratory: Normal respiratory effort without tachypnea or retractions. Lungs CTAB. Good air entry to the bases with no decreased or absent breath sounds. Musculoskeletal: Full range of motion to all extremities.  Neurologic:  No gross focal neurologic deficits are appreciated.   Cranial nerves II through XII grossly intact. Skin:   No rash noted Other:   ED Results / Procedures / Treatments   Labs (all labs ordered are listed, but only abnormal results are displayed) Labs Reviewed - No data to display   EKG     RADIOLOGY  I personally viewed, evaluated, and interpreted these images as part of my medical decision making, as well as reviewing the written report by the radiologist.  ED Provider Interpretation: No acute traumatic finding with intracranial hemorrhage or skull fracture.  No cervical spine fracture identified.  CT Cervical Spine Wo Contrast  Result Date: 03/15/2023 CLINICAL DATA:  Fall, head trauma EXAM: CT HEAD WITHOUT CONTRAST CT CERVICAL SPINE WITHOUT CONTRAST TECHNIQUE: Multidetector CT imaging of the head and cervical spine was performed following the standard protocol without intravenous contrast. Multiplanar CT image reconstructions of the cervical spine were also generated. RADIATION DOSE REDUCTION: This exam was performed according to the departmental dose-optimization program which includes automated exposure control, adjustment of the mA and/or kV according to patient size and/or use of iterative reconstruction technique. COMPARISON:  None Available. FINDINGS: CT HEAD FINDINGS Brain: No evidence of acute infarction, hemorrhage, hydrocephalus, extra-axial collection or mass lesion/mass effect. Mild periventricular and deep white  matter hypodensity. Vascular: No hyperdense vessel or unexpected calcification. Skull: Normal. Negative for fracture or focal lesion. Sinuses/Orbits: No acute finding. Other: None. CT CERVICAL SPINE FINDINGS Alignment: Straightening of the normal cervical lordosis. Skull base and vertebrae: No acute fracture. No primary bone lesion or focal pathologic process. Soft tissues and spinal canal: No prevertebral fluid or swelling. No visible canal hematoma. Disc levels: Focally moderate disc space height loss and osteophytosis  at C6-C7 with large posterior osteophytes (series 6, image 32). Upper chest: Negative. Other: None. IMPRESSION: 1. No acute intracranial pathology. Small-vessel white matter disease. 2. No fracture or static subluxation of the cervical spine. 3. Focally moderate disc space height loss and osteophytosis at C6-C7 with large posterior osteophytes. Cervical disc and neural foraminal pathology may be further evaluated by MRI if indicated by neurologically localizing signs and symptoms. Electronically Signed   By: Jearld Lesch M.D.   On: 03/15/2023 16:35   CT Head Wo Contrast  Result Date: 03/15/2023 CLINICAL DATA:  Fall, head trauma EXAM: CT HEAD WITHOUT CONTRAST CT CERVICAL SPINE WITHOUT CONTRAST TECHNIQUE: Multidetector CT imaging of the head and cervical spine was performed following the standard protocol without intravenous contrast. Multiplanar CT image reconstructions of the cervical spine were also generated. RADIATION DOSE REDUCTION: This exam was performed according to the departmental dose-optimization program which includes automated exposure control, adjustment of the mA and/or kV according to patient size and/or use of iterative reconstruction technique. COMPARISON:  None Available. FINDINGS: CT HEAD FINDINGS Brain: No evidence of acute infarction, hemorrhage, hydrocephalus, extra-axial collection or mass lesion/mass effect. Mild periventricular and deep white matter hypodensity. Vascular: No hyperdense vessel or unexpected calcification. Skull: Normal. Negative for fracture or focal lesion. Sinuses/Orbits: No acute finding. Other: None. CT CERVICAL SPINE FINDINGS Alignment: Straightening of the normal cervical lordosis. Skull base and vertebrae: No acute fracture. No primary bone lesion or focal pathologic process. Soft tissues and spinal canal: No prevertebral fluid or swelling. No visible canal hematoma. Disc levels: Focally moderate disc space height loss and osteophytosis at C6-C7 with large  posterior osteophytes (series 6, image 32). Upper chest: Negative. Other: None. IMPRESSION: 1. No acute intracranial pathology. Small-vessel white matter disease. 2. No fracture or static subluxation of the cervical spine. 3. Focally moderate disc space height loss and osteophytosis at C6-C7 with large posterior osteophytes. Cervical disc and neural foraminal pathology may be further evaluated by MRI if indicated by neurologically localizing signs and symptoms. Electronically Signed   By: Jearld Lesch M.D.   On: 03/15/2023 16:35    PROCEDURES:  Critical Care performed: No  Procedures   MEDICATIONS ORDERED IN ED: Medications  acetaminophen (TYLENOL) tablet 650 mg (650 mg Oral Given 03/15/23 1621)     IMPRESSION / MDM / ASSESSMENT AND PLAN / ED COURSE  I reviewed the triage vital signs and the nursing notes.                                 Differential diagnosis includes, but is not limited to, fall, skull fracture, intracranial hemorrhage, scalp laceration   Patient's presentation is most consistent with acute presentation with potential threat to life or bodily function.   Patient's diagnosis is consistent with fall, scalp abrasion, minor head injury.  Patient presents emergency department a mechanical fall.  Patient was in wheelchair and got stuck on a grate and she fell backwards hitting her head.  No loss of consciousness.  CT scans  reassuring no skull fracture, intracranial hemorrhage or cervical spine injury.  Patient had reassuring CTs.  Wound is cleaned out but does not require closure.  Wound care instructions discussed with the patient and family member present.  Follow-up primary care as needed.   Patient is given ED precautions to return to the ED for any worsening or new symptoms.     FINAL CLINICAL IMPRESSION(S) / ED DIAGNOSES   Final diagnoses:  Fall, initial encounter  Minor head injury, initial encounter  Abrasion of scalp, initial encounter     Rx / DC Orders    ED Discharge Orders     None        Note:  This document was prepared using Dragon voice recognition software and may include unintentional dictation errors.   Racheal Patches, PA-C 03/15/23 1859    Georga Hacking, MD 03/15/23 1944

## 2023-03-15 NOTE — ED Notes (Signed)
First nurse note:  BIB [facility] from [facility]. Pt was sitting on her roll-ater walker. Pt went head first onto concrete. Denies blood thinners. Pt has neck pain and lac to back of head. Denies LOC.   128/70 96HR

## 2023-03-18 ENCOUNTER — Other Ambulatory Visit: Payer: Self-pay | Admitting: Orthopedic Surgery

## 2023-03-18 DIAGNOSIS — R937 Abnormal findings on diagnostic imaging of other parts of musculoskeletal system: Secondary | ICD-10-CM

## 2023-03-18 DIAGNOSIS — M1612 Unilateral primary osteoarthritis, left hip: Secondary | ICD-10-CM

## 2023-03-27 ENCOUNTER — Ambulatory Visit
Admission: RE | Admit: 2023-03-27 | Discharge: 2023-03-27 | Disposition: A | Discharge status: Home / Self Care | Payer: 59 | Source: Ambulatory Visit | Attending: Orthopedic Surgery | Admitting: Orthopedic Surgery

## 2023-03-27 ENCOUNTER — Encounter: Payer: Self-pay | Admitting: Internal Medicine

## 2023-03-27 DIAGNOSIS — R937 Abnormal findings on diagnostic imaging of other parts of musculoskeletal system: Secondary | ICD-10-CM

## 2023-03-27 DIAGNOSIS — M1612 Unilateral primary osteoarthritis, left hip: Secondary | ICD-10-CM | POA: Insufficient documentation

## 2023-04-24 ENCOUNTER — Ambulatory Visit (INDEPENDENT_AMBULATORY_CARE_PROVIDER_SITE_OTHER): Payer: 59 | Admitting: Podiatry

## 2023-04-24 ENCOUNTER — Ambulatory Visit (INDEPENDENT_AMBULATORY_CARE_PROVIDER_SITE_OTHER): Payer: 59

## 2023-04-24 DIAGNOSIS — M7752 Other enthesopathy of left foot: Secondary | ICD-10-CM

## 2023-04-24 DIAGNOSIS — M779 Enthesopathy, unspecified: Secondary | ICD-10-CM

## 2023-04-24 DIAGNOSIS — M79674 Pain in right toe(s): Secondary | ICD-10-CM | POA: Diagnosis not present

## 2023-04-24 DIAGNOSIS — M79675 Pain in left toe(s): Secondary | ICD-10-CM | POA: Diagnosis not present

## 2023-04-24 DIAGNOSIS — M7751 Other enthesopathy of right foot: Secondary | ICD-10-CM

## 2023-04-24 DIAGNOSIS — B351 Tinea unguium: Secondary | ICD-10-CM | POA: Diagnosis not present

## 2023-04-24 MED ORDER — BETAMETHASONE SOD PHOS & ACET 6 (3-3) MG/ML IJ SUSP
3.0000 mg | Freq: Once | INTRAMUSCULAR | Status: AC
Start: 2023-04-24 — End: 2023-04-24
  Administered 2023-04-24: 3 mg via INTRA_ARTICULAR

## 2023-04-24 NOTE — Progress Notes (Signed)
   Chief Complaint  Patient presents with   Diabetes    "Trim my toenails and my ankle is swollen."    SUBJECTIVE Patient PMHx diabetes mellitus presents to office today complaining of elongated, thickened nails that cause pain while ambulating in shoes.  Patient is unable to trim their own nails.  Patient has also been complaining of increased pain and tenderness associated to the bilateral ankles. Injections in the past have helped  Past Medical History:  Diagnosis Date   AF (atrial fibrillation) (HCC)    AKI (acute kidney injury) (HCC) 07/21/2019   Arthritis    Asthma    Chicken pox    Clotting disorder (HCC)    Depression    Diabetes mellitus without complication (HCC)    Diverticulosis    Dysrhythmia    GERD (gastroesophageal reflux disease)    Glaucoma    Hemorrhoids    Hemorrhoids, internal    HH (hiatus hernia)    History of hiatal hernia    History of kidney stones    Hypertension    Migraines    Neuropathy    Obesity    Opiate use 03/16/2017   Personal history of tobacco use, presenting hazards to health 06/18/2015   Sleep apnea    Tubular adenoma of colon     No Known Allergies   OBJECTIVE General Patient is awake, alert, and oriented x 3 and in no acute distress. Derm Skin is dry and supple bilateral. Negative open lesions or macerations. Remaining integument unremarkable. Nails are tender, long, thickened and dystrophic with subungual debris, consistent with onychomycosis, 1-5 bilateral. No signs of infection noted. Vasc  DP and PT pedal pulses palpable bilaterally. Temperature gradient within normal limits.  Neuro Epicritic and protective threshold sensation diminished bilaterally.  Musculoskeletal Exam No symptomatic pedal deformities noted bilateral. Muscular strength within normal limits.  There is tenderness and pain with palpation to the bilateral ankles. Pes planus deformity also noted Radiographic exam b/l ankles 04/24/2023 No acute fractures  identified. Diffuse osseous demineralization. Pes planus noted on lateral view. Mild degenerative changes noted to the ankle and pedal joints  ASSESSMENT 1. Diabetes Mellitus w/ peripheral neuropathy 2.  Pain due to onychomycosis of toenails bilateral 3. Capsulitis/DJD bilateral ankles  PLAN OF CARE 1. Patient evaluated today.  Comprehensive diabetic foot exam performed today 2. Instructed to maintain good pedal hygiene and foot care. Stressed importance of controlling blood sugar.  3. Mechanical debridement of nails 1-5 bilaterally performed using a nail nipper. Filed with dremel without incident.  4.  Injection of 0.5 cc Celestone Soluspan injected into the bilateral ankles lateral aspect 5.  Return to clinic in 3 mos.     Felecia Shelling, DPM [facility]  Dr. Felecia Shelling, DPM    [address] [place], [place] [address]                Office [phone]  Fax [phone]

## 2023-05-14 ENCOUNTER — Other Ambulatory Visit: Payer: Self-pay | Admitting: Orthopedic Surgery

## 2023-05-22 ENCOUNTER — Other Ambulatory Visit: Payer: Self-pay

## 2023-05-22 ENCOUNTER — Encounter
Admission: RE | Admit: 2023-05-22 | Discharge: 2023-05-22 | Disposition: A | Discharge status: Home / Self Care | Payer: 59 | Source: Ambulatory Visit | Attending: Orthopedic Surgery | Admitting: Orthopedic Surgery

## 2023-05-22 ENCOUNTER — Other Ambulatory Visit: Payer: 59

## 2023-05-22 VITALS — BP 161/84 | HR 60 | Temp 98.5°F | Resp 18 | Ht 61.0 in | Wt 191.0 lb

## 2023-05-22 DIAGNOSIS — R001 Bradycardia, unspecified: Secondary | ICD-10-CM | POA: Diagnosis not present

## 2023-05-22 DIAGNOSIS — Z01812 Encounter for preprocedural laboratory examination: Secondary | ICD-10-CM

## 2023-05-22 DIAGNOSIS — Z794 Long term (current) use of insulin: Secondary | ICD-10-CM | POA: Diagnosis not present

## 2023-05-22 DIAGNOSIS — I10 Essential (primary) hypertension: Secondary | ICD-10-CM | POA: Insufficient documentation

## 2023-05-22 DIAGNOSIS — Z79899 Other long term (current) drug therapy: Secondary | ICD-10-CM | POA: Diagnosis not present

## 2023-05-22 DIAGNOSIS — I452 Bifascicular block: Secondary | ICD-10-CM | POA: Insufficient documentation

## 2023-05-22 DIAGNOSIS — E119 Type 2 diabetes mellitus without complications: Secondary | ICD-10-CM | POA: Diagnosis not present

## 2023-05-22 DIAGNOSIS — Z01818 Encounter for other preprocedural examination: Secondary | ICD-10-CM | POA: Insufficient documentation

## 2023-05-22 HISTORY — DX: Cardiac arrhythmia, unspecified: I49.9

## 2023-05-22 LAB — CBC WITH DIFFERENTIAL/PLATELET
Abs Immature Granulocytes: 0.03 10*3/uL (ref 0.00–0.07)
Basophils Absolute: 0 10*3/uL (ref 0.0–0.1)
Basophils Relative: 0 %
Eosinophils Absolute: 0.1 10*3/uL (ref 0.0–0.5)
Eosinophils Relative: 2 %
HCT: 37.1 % (ref 36.0–46.0)
Hemoglobin: 12.4 g/dL (ref 12.0–15.0)
Immature Granulocytes: 1 %
Lymphocytes Relative: 32 %
Lymphs Abs: 1.8 10*3/uL (ref 0.7–4.0)
MCH: 27.2 pg (ref 26.0–34.0)
MCHC: 33.4 g/dL (ref 30.0–36.0)
MCV: 81.4 fL (ref 80.0–100.0)
Monocytes Absolute: 0.4 10*3/uL (ref 0.1–1.0)
Monocytes Relative: 7 %
Neutro Abs: 3.3 10*3/uL (ref 1.7–7.7)
Neutrophils Relative %: 58 %
Platelets: 246 10*3/uL (ref 150–400)
RBC: 4.56 MIL/uL (ref 3.87–5.11)
RDW: 13.4 % (ref 11.5–15.5)
WBC: 5.6 10*3/uL (ref 4.0–10.5)
nRBC: 0 % (ref 0.0–0.2)

## 2023-05-22 LAB — COMPREHENSIVE METABOLIC PANEL
ALT: 19 U/L (ref 0–44)
AST: 23 U/L (ref 15–41)
Albumin: 3.6 g/dL (ref 3.5–5.0)
Alkaline Phosphatase: 67 U/L (ref 38–126)
Anion gap: 8 (ref 5–15)
BUN: <5 mg/dL — ABNORMAL LOW (ref 8–23)
CO2: 25 mmol/L (ref 22–32)
Calcium: 9.7 mg/dL (ref 8.9–10.3)
Chloride: 104 mmol/L (ref 98–111)
Creatinine, Ser: 0.72 mg/dL (ref 0.44–1.00)
GFR, Estimated: >60 mL/min (ref >60)
Glucose, Bld: 118 mg/dL — ABNORMAL HIGH (ref 70–99)
Potassium: 3.3 mmol/L — ABNORMAL LOW (ref 3.5–5.1)
Sodium: 137 mmol/L (ref 135–145)
Total Bilirubin: 0.8 mg/dL (ref 0.3–1.2)
Total Protein: 7.3 g/dL (ref 6.5–8.1)

## 2023-05-22 LAB — URINALYSIS, ROUTINE W REFLEX MICROSCOPIC
Bilirubin Urine: NEGATIVE
Glucose, UA: 50 mg/dL — AB
Hgb urine dipstick: NEGATIVE
Ketones, ur: NEGATIVE mg/dL
Leukocytes,Ua: NEGATIVE
Nitrite: NEGATIVE
Protein, ur: NEGATIVE mg/dL
Specific Gravity, Urine: 1.004 — ABNORMAL LOW (ref 1.005–1.030)
pH: 6 (ref 5.0–8.0)

## 2023-05-22 LAB — TYPE AND SCREEN
ABO/RH(D): O POS
Antibody Screen: NEGATIVE

## 2023-05-22 LAB — SURGICAL PCR SCREEN
MRSA, PCR: NEGATIVE
Staphylococcus aureus: NEGATIVE

## 2023-05-22 NOTE — Patient Instructions (Addendum)
Your procedure is scheduled on:  Thursday, August 29  Report to the Registration Desk on the 1st floor of the [facility] To find out your arrival time, please call [phone] between 1PM - 3PM on:  Wednesday, August 28  If your arrival time is 6:00 am, do not arrive before that time as the [facility] entrance doors do not open until 6:00 am.  REMEMBER: Instructions that are not followed completely may result in serious medical risk, up to and including death; or upon the discretion of your surgeon and anesthesiologist your surgery may need to be rescheduled.  Do not eat food after midnight the night before surgery.  No gum chewing or hard candies.  You may however, drink WATER up to 2 hours before you are scheduled to arrive for your surgery. Do not drink anything within 2 hours of your scheduled arrival time.  In addition, your doctor has ordered for you to drink the provided:  Gatorade G2 Drinking this carbohydrate drink up to two hours before surgery helps to reduce insulin resistance and improve patient outcomes. Please complete drinking 2 hours before scheduled arrival time.  One week prior to surgery: Stop Anti-inflammatories (NSAIDS) such as Advil, Aleve, Ibuprofen, Motrin, Naproxen, Naprosyn and Aspirin based products such as Excedrin, Goody's Powder, BC Powder. Stop ANY OVER THE COUNTER supplements until after surgery. vitamin B-12   Ascorbic Acid (VITAMIN C)  Omega-3 Fatty Acids (FISH OIL)  aspirin 81 last dose Sunday August 18   You may however, continue to take Tylenol if needed for pain up until the day of surgery.  Continue taking all prescribed medications with the exception of the following: insulin lispro (HUMALOG) do not take the day of surgery, last dose Wednesday August 28 with your meals only.  JARDIANCE hold 3 days prior to your surgery, last dose Sunday August 25 [facility] do not take the day of surgery metFORMIN (GLUCOPHAGE) hold 2 days prior to  surgery, last dose Monday August 26  olmesartan-hydrochlorothiazide (BENICAR HCT) hold the day of surgery, last dose Wednesday August 28    TAKE ONLY THESE MEDICATIONS THE MORNING OF SURGERY WITH A SIP OF WATER:  COMBIVENT RESPIMAT  DULoxetine (CYMBALTA)  gabapentin (NEURONTIN)  pantoprazole (PROTONIX)   (take one the night before and one on the morning of surgery - helps to prevent nausea after surgery.)  Use inhalers on the day of surgery and bring to the hospital.  No Alcohol for 24 hours before or after surgery.  No Smoking including e-cigarettes for 24 hours before surgery.  No chewable tobacco products for at least 6 hours before surgery.  No nicotine patches on the day of surgery.  Do not use any "recreational" drugs for at least a week (preferably 2 weeks) before your surgery.  Please be advised that the combination of cocaine and anesthesia may have negative outcomes, up to and including death. If you test positive for cocaine, your surgery will be cancelled.  On the morning of surgery brush your teeth with toothpaste and water, you may rinse your mouth with mouthwash if you wish. Do not swallow any toothpaste or mouthwash.  Use CHG Soap as directed on instruction sheet.  Do not wear jewelry, make-up, hairpins, clips or nail polish.  Do not wear lotions, powders, or perfumes.   Do not shave body hair from the neck down 48 hours before surgery.  Contact lenses, hearing aids and dentures may not be worn into surgery.  Do not bring valuables to  the hospital. [facility] is not responsible for any missing/lost belongings or valuables.   Notify your doctor if there is any change in your medical condition (cold, fever, infection).  Wear comfortable clothing (specific to your surgery type) to the hospital.  After surgery, you can help prevent lung complications by doing breathing exercises.  Take deep breaths and cough every 1-2 hours.  If you are being admitted to the  hospital overnight, leave your suitcase in the car. After surgery it may be brought to your room.  In case of increased patient census, it may be necessary for you, the patient, to continue your postoperative care in the Same Day Surgery department.  If you are being discharged the day of surgery, you will not be allowed to drive home. You will need a responsible individual to drive you home and stay with you for 24 hours after surgery.   If you are taking public transportation, you will need to have a responsible individual with you.  Please call the Pre-admissions Testing Dept. at [phone] if you have any questions about these instructions.  Surgery Visitation Policy:  Patients having surgery or a procedure may have two visitors.  Children under the age of 78 must have an adult with them who is not the patient.  Inpatient Visitation:    Visiting hours are 7 a.m. to 8 p.m. Up to four visitors are allowed at one time in a patient room. The visitors may rotate out with other people during the day.  One visitor age 66 or older may stay with the patient overnight and must be in the room by 8 p.m.       Pre-operative 5 CHG Bath Instructions   You can play a key role in reducing the risk of infection after surgery. Your skin needs to be as free of germs as possible. You can reduce the number of germs on your skin by washing with CHG (chlorhexidine gluconate) soap before surgery. CHG is an antiseptic soap that kills germs and continues to kill germs even after washing.   DO NOT use if you have an allergy to chlorhexidine/CHG or antibacterial soaps. If your skin becomes reddened or irritated, stop using the CHG and notify one of our RNs at [phone].   Please shower with the CHG soap starting 4 days before surgery using the following schedule:     Please keep in mind the following:  DO NOT shave, including legs and underarms, starting the day of your first shower.   You may  shave your face at any point before/day of surgery.  Place clean sheets on your bed the day you start using CHG soap. Use a clean washcloth (not used since being washed) for each shower. DO NOT sleep with pets once you start using the CHG.   CHG Shower Instructions:  If you choose to wash your hair and private area, wash first with your normal shampoo/soap.  After you use shampoo/soap, rinse your hair and body thoroughly to remove shampoo/soap residue.  Turn the water OFF and apply about 3 tablespoons (45 ml) of CHG soap to a CLEAN washcloth.  Apply CHG soap ONLY FROM YOUR NECK DOWN TO YOUR TOES (washing for 3-5 minutes)  DO NOT use CHG soap on face, private areas, open wounds, or sores.  Pay special attention to the area where your surgery is being performed.  If you are having back surgery, having someone wash your back for you may be helpful. Wait 2  minutes after CHG soap is applied, then you may rinse off the CHG soap.  Pat dry with a clean towel  Put on clean clothes/pajamas   If you choose to wear lotion, please use ONLY the CHG-compatible lotions on the back of this paper.     Additional instructions for the day of surgery: DO NOT APPLY any lotions, deodorants, cologne, or perfumes.   Put on clean/comfortable clothes.  Brush your teeth.  Ask your nurse before applying any prescription medications to the skin.      CHG Compatible Lotions   Aveeno Moisturizing lotion  Cetaphil Moisturizing Cream  Cetaphil Moisturizing Lotion  Clairol Herbal Essence Moisturizing Lotion, Dry Skin  Clairol Herbal Essence Moisturizing Lotion, Extra Dry Skin  Clairol Herbal Essence Moisturizing Lotion, Normal Skin  Curel Age Defying Therapeutic Moisturizing Lotion with Alpha Hydroxy  Curel Extreme Care Body Lotion  Curel Soothing Hands Moisturizing Hand Lotion  Curel Therapeutic Moisturizing Cream, Fragrance-Free  Curel Therapeutic Moisturizing Lotion, Fragrance-Free  Curel Therapeutic  Moisturizing Lotion, Original Formula  Eucerin Daily Replenishing Lotion  Eucerin Dry Skin Therapy Plus Alpha Hydroxy Crme  Eucerin Dry Skin Therapy Plus Alpha Hydroxy Lotion  Eucerin Original Crme  Eucerin Original Lotion  Eucerin Plus Crme Eucerin Plus Lotion  Eucerin TriLipid Replenishing Lotion  Keri Anti-Bacterial Hand Lotion  Keri Deep Conditioning Original Lotion Dry Skin Formula Softly Scented  Keri Deep Conditioning Original Lotion, Fragrance Free Sensitive Skin Formula  Keri Lotion Fast Absorbing Fragrance Free Sensitive Skin Formula  Keri Lotion Fast Absorbing Softly Scented Dry Skin Formula  Keri Original Lotion  Keri Skin Renewal Lotion Keri Silky Smooth Lotion  Keri Silky Smooth Sensitive Skin Lotion  Nivea Body Creamy Conditioning Oil  Nivea Body Extra Enriched Lotion  Nivea Body Original Lotion  Nivea Body Sheer Moisturizing Lotion Nivea Crme  Nivea Skin Firming Lotion  NutraDerm 30 Skin Lotion  NutraDerm Skin Lotion  NutraDerm Therapeutic Skin Cream  NutraDerm Therapeutic Skin Lotion  ProShield Protective Hand Cream  Provon moisturizing lotion         Preoperative Educational Videos for Total Hip, Knee and Shoulder Replacements  To better prepare for surgery, please view our videos that explain the physical activity and discharge planning required to have the best surgical recovery at [facility].  [email or web address]  Questions? Call [phone] or email [email or web address]        How to Use an Incentive Spirometer An incentive spirometer is a tool that measures how well you are filling your lungs with each breath. Learning to take long, deep breaths using this tool can help you keep your lungs clear and active. This may help to reverse or lessen your chance of developing breathing (pulmonary) problems, especially infection. You may be asked to use a spirometer: After a surgery. If you have a  lung problem or a history of smoking. After a long period of time when you have been unable to move or be active. If the spirometer includes an indicator to show the highest number that you have reached, your health care provider or respiratory therapist will help you set a goal. Keep a log of your progress as told by your health care provider. What are the risks? Breathing too quickly may cause dizziness or cause you to pass out. Take your time so you do not get dizzy or light-headed. If you are in pain, you may need to take pain medicine before doing incentive spirometry. It is harder to take a deep breath if you  are having pain. How to use your incentive spirometer  Sit up on the edge of your bed or on a chair. Hold the incentive spirometer so that it is in an upright position. Before you use the spirometer, breathe out normally. Place the mouthpiece in your mouth. Make sure your lips are closed tightly around it. Breathe in slowly and as deeply as you can through your mouth, causing the piston or the ball to rise toward the top of the chamber. Hold your breath for 3-5 seconds, or for as long as possible. If the spirometer includes a coach indicator, use this to guide you in breathing. Slow down your breathing if the indicator goes above the marked areas. Remove the mouthpiece from your mouth and breathe out normally. The piston or ball will return to the bottom of the chamber. Rest for a few seconds, then repeat the steps 10 or more times. Take your time and take a few normal breaths between deep breaths so that you do not get dizzy or light-headed. Do this every 1-2 hours when you are awake. If the spirometer includes a goal marker to show the highest number you have reached (best effort), use this as a goal to work toward during each repetition. After each set of 10 deep breaths, cough a few times. This will help to make sure that your lungs are clear. If you have an incision on your chest  or abdomen from surgery, place a pillow or a rolled-up towel firmly against the incision when you cough. This can help to reduce pain while taking deep breaths and coughing. General tips When you are able to get out of bed: Walk around often. Continue to take deep breaths and cough in order to clear your lungs. Keep using the incentive spirometer until your health care provider says it is okay to stop using it. If you have been in the hospital, you may be told to keep using the spirometer at home. Contact a health care provider if: You are having difficulty using the spirometer. You have trouble using the spirometer as often as instructed. Your pain medicine is not giving enough relief for you to use the spirometer as told. You have a fever. Get help right away if: You develop shortness of breath. You develop a cough with bloody mucus from the lungs. You have fluid or blood coming from an incision site after you cough. Summary An incentive spirometer is a tool that can help you learn to take long, deep breaths to keep your lungs clear and active. You may be asked to use a spirometer after a surgery, if you have a lung problem or a history of smoking, or if you have been inactive for a long period of time. Use your incentive spirometer as instructed every 1-2 hours while you are awake. If you have an incision on your chest or abdomen, place a pillow or a rolled-up towel firmly against your incision when you cough. This will help to reduce pain. Get help right away if you have shortness of breath, you cough up bloody mucus, or blood comes from your incision when you cough. This information is not intended to replace advice given to you by your health care provider. Make sure you discuss any questions you have with your health care provider. Document Revised: 12/12/2019 Document Reviewed: 12/12/2019 Elsevier Patient Education  2024 [employer].

## 2023-05-23 LAB — NICOTINE SCREEN, URINE: Cotinine Ql Scrn, Ur: NEGATIVE ng/mL

## 2023-06-01 ENCOUNTER — Encounter: Payer: Self-pay | Admitting: Urgent Care

## 2023-06-01 NOTE — Progress Notes (Signed)
Perioperative / Anesthesia Services  Pre-Admission Testing Clinical Review / Pre-Operative Anesthesia Consult  Date: 06/02/23  Patient Demographics:  Name: Lynn Potter DOB:   10-Mar-1946 MRN:   [id number]  Planned Surgical Procedure(s):    Case: [id number] Date/Time: 06/04/23 0715   Procedure: TOTAL HIP ARTHROPLASTY ANTERIOR APPROACH (Left: Hip)   Anesthesia type: Choice   Pre-op diagnosis: Osteoarthritis of left hip, unspecified osteoarthritis type M16.12   Location: [facility] OR ROOM 01 / [facility]   Surgeons: Reinaldo Berber, MD     NOTE: Available PAT nursing documentation and vital signs have been reviewed. Clinical nursing staff has updated patient's PMH/PSHx, current medication list, and drug allergies/intolerances to ensure comprehensive history available to assist in medical decision making as it pertains to the aforementioned surgical procedure and anticipated anesthetic course. Extensive review of available clinical information personally performed. Lynn Potter PMH and PSHx updated with any diagnoses/procedures that  may have been inadvertently omitted during her intake with the pre-admission testing department's nursing staff.  Clinical Discussion:  Lynn Potter is a 77 y.o. female who is submitted for pre-surgical anesthesia review and clearance prior to her undergoing the above procedure.Patient is a Current Smoker. Pertinent PMH includes: CAD, atrial fibrillation, diastolic dysfunction, aortic atherosclerosis, cerebral microvascular disease, LAFB, HTN, HLD, T2DM, DOE, asthma, COPD, OSAH (noncompliant with nocturnal PAP therapy), GERD (on daily PPI), hiatal hernia, duodenal NET (s/p bowel resection), RIGHT myofibroblastoma (s/p partial mastectomy), IDA, glaucoma, OA, diabetic peripheral neuropathy, lumbar DDD with spinal stenosis (s/p L4-S1 fusion), RLS, depression, insomnia.  Patient is followed by cardiology Juliann Pares, MD). She was last seen in the cardiology  clinic on 05/28/2019 for; notes reviewed. At the time of her clinic visit, patient reporting chronic exertional dyspnea that was reported to be stable and at baseline.  Additionally, patient with BILATERAL ankle edema. Patient denied any chest pain, PND, orthopnea, palpitations, weakness, fatigue, vertiginous symptoms, or presyncope/syncope. Patient with a past medical history significant for cardiovascular diagnoses. Documented physical exam was grossly benign, providing no evidence of acute exacerbation and/or decompensation of the patient's known cardiovascular conditions.  Myocardial perfusion imaging study was performed on 06/18/2022 revealing a normal left ventricular systolic function with an EF of 59%.  There was normal regional wall motion and myocardial thickening.  No evidence of stress-induced myocardial ischemia or arrhythmia; no sonographic evidence of scar.  Cardiology noted that study was indeterminant overall.  Most recent TTE was performed on 06/26/2022 revealing a normal left ventricular systolic function with an EF of >55%.  There was mild LVH. Left ventricular diastolic Doppler parameters consistent with abnormal relaxation (G1DD).  Trivial mitral and tricuspid, in addition to mild aortic, valve regurgitation was noted.  All transvalvular gradients were noted to be normal providing no evidence suggestive of valvular stenosis.  RVSP = 20.5 mmHg.  Aorta normal in size with no evidence of aneurysmal dilatation.  Blood pressure well controlled at 120/74 mmHg on currently prescribed ARB/diuretic (olmesartan/HCTZ) therapy.  Patient formerly on beta-blocker, however she notes that this medication was stopped for an unknown reason by another provider. Patient is on atorvastatin + omega-3 fatty acid capsule daily for her HLD diagnosis and ASCVD prevention. T2DM well controlled on currently prescribed regimen; last HgbA1c was 6.5% when checked on 10/15/2022. She does have an OSAH diagnosis, however  is reportedly noncompliant with prescribed nocturnal PAP therapy. Functional capacity is somewhat limited by patient's age, deconditioning, overall respiratory status, and her multiple medical comorbidities. With that said, patient is able to  complete all of her ADL/IADLs without significant cardiovascular limitation. Per the DASI, patient is able to achieve at least 4 METS of physical activity without experiencing any significant degree of angina/anginal equivalent symptoms. No changes were made to her medication regimen during her visit with cardiology.  Patient scheduled to follow-up with outpatient cardiology in 12 months or sooner if needed.  Lynn Potter is scheduled for an elective TOTAL HIP ARTHROPLASTY ANTERIOR APPROACH (Left: Hip) on 06/04/2023 with Dr. Reinaldo Berber, MD.  Given patient's past medical history significant for cardiovascular diagnoses, presurgical cardiac clearance was sought by the PAT team. Per cardiology, "Without angina. With stable DOE and BLE edema. Normal cardiac diagnostic work up in last year. Cleared for hip surgery with Dr. Audelia Acton. LOW risk for surgery based on clinical presentation, diagnostic work up within the last year, and history".   In review of her medication reconciliation, it is noted that patient is currently on prescribed daily antithrombotic therapy. She has been instructed on recommendations for holding her daily low-dose ASA for 5 days prior to her procedure with plans to restart as soon as postoperative bleeding risk felt to be minimized by her attending surgeon. The patient has been instructed that her last dose of her ASA should be on 05/29/2023.  Patient denies previous perioperative complications with anesthesia in the past. In review of the available records, it is noted that patient underwent a general anesthetic course at [facility] ([facility] in 12/2020 without documented complications.      05/22/2023    11:35 AM 03/15/2023    4:01 PM 01/20/2023    8:48 AM  Vitals with BMI  Height 5\' 1"     Weight 191 lbs 190 lbs   BMI 36.11    Systolic 161 119 161  Diastolic 84 84 88  Pulse 60 81 87    Providers/Specialists:   NOTE: Primary physician provider listed below. Patient may have been seen by APP or partner within same practice.   PROVIDER ROLE / SPECIALTY LAST Wilber Bihari, MD Orthopedics (Surgeon) 05/20/2023  Armando Gang, FNP Primary Care Provider ???  Rudean Hitt, MD Cardiology 05/28/2023  Vida Rigger, MD Pulmonary Medicine 02/27/2023  Filomena Jungling, MD Physiatry 10/21/2022  Louretta Shorten, MD Medical Oncology 08/01/2022   Allergies:  Patient has no known allergies.  Current Home Medications:   No current facility-administered medications for this encounter.    atorvastatin (LIPITOR) 40 MG tablet   acetaminophen (TYLENOL) 500 MG tablet   Ascorbic Acid (VITAMIN C) 1000 MG tablet   aspirin 81 MG tablet   calcium carbonate (OSCAL) 1500 (600 Ca) MG TABS tablet   COMBIVENT RESPIMAT 20-100 MCG/ACT AERS respimat   DULoxetine (CYMBALTA) 30 MG capsule   gabapentin (NEURONTIN) 300 MG capsule   insulin lispro (HUMALOG) 100 UNIT/ML KwikPen   JARDIANCE 25 MG TABS tablet   latanoprost (XALATAN) 0.005 % ophthalmic solution   LEVEMIR FLEXTOUCH 100 UNIT/ML Pen   metFORMIN (GLUCOPHAGE) 500 MG tablet   olmesartan-hydrochlorothiazide (BENICAR HCT) 40-25 MG tablet   Omega-3 Fatty Acids (FISH OIL) 1000 MG CAPS   pantoprazole (PROTONIX) 40 MG tablet   rOPINIRole (REQUIP) 1 MG tablet   traZODone (DESYREL) 50 MG tablet   vitamin B-12 1000 MCG tablet   History:   Past Medical History:  Diagnosis Date   AF (atrial fibrillation) (HCC)    a.) CHA2DS2-VASc: 23 (age x2, sex, HTN, vascular disease history, T2DM); b.) rate/rhythm maintained intrinsically without pharmacological intervention;  no chronic oral anticoagulation   Aortic atherosclerosis (HCC)    Arthritis     Asthma    B12 deficiency    Bilateral carpal tunnel syndrome    CAD (coronary artery disease)    Cerebral microvascular disease    Chicken pox    Clotting disorder (HCC)    COPD (chronic obstructive pulmonary disease) (HCC)    DDD (degenerative disc disease), lumbosacral    a.) s/p L4-S1 fusion in 2006   Depression    Diabetic peripheral neuropathy (HCC)    Diastolic dysfunction 06/06/2022   a.) TTE 06/06/2022: EF >55%, no RWMAs, mild LVH, triv MR/TR, mild AR, RVSP 20.5, G1DD   Diverticulosis    DOE (dyspnea on exertion)    GERD (gastroesophageal reflux disease)    Glaucoma    Hepatic steatosis    HH (hiatus hernia)    History of kidney stones    HLD (hyperlipidemia)    Hypertension    IDA (iron deficiency anemia)    Insomnia    a.) uses trazodone PRN   Internal hemorrhoids 12/25/2014   LAFB (left anterior fascicular block)    Long term current use of aspirin    Lumbar spinal stenosis    Migraines    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   a.) Bx 10/18/2020 - spindle cell neoplasm; b.) s/p partial mastectomy 12/06/2020 --> final pathology revealed estrogen receptor (+) myofibroblastoma   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; well differentiated --> s/p bowel resection   Obesity    OSA (obstructive sleep apnea)    a.) non-compliant with prescribed nocturnal PAP therapy   Personal history of tobacco use, presenting hazards to health 06/18/2015   RLS (restless legs syndrome)    a.) on ropinirole   Status post carpal tunnel release    T2DM (type 2 diabetes mellitus) (HCC)    Tubular adenoma of colon    Past Surgical History:  Procedure Laterality Date   ABDOMINAL HYSTERECTOMY N/A 1982   APPENDECTOMY     BACK SURGERY  2002   CARPAL TUNNEL RELEASE     CATARACT EXTRACTION  2006   CHOLECYSTECTOMY     COLONOSCOPY WITH ESOPHAGOGASTRODUODENOSCOPY (EGD)     COLONOSCOPY WITH PROPOFOL N/A 08/03/2019   Procedure: COLONOSCOPY WITH  PROPOFOL;  Surgeon: Toledo, Boykin Nearing, MD;  Location: [facility] ENDOSCOPY;  Service: Gastroenterology;  Laterality: N/A;   ESOPHAGOGASTRODUODENOSCOPY (EGD) WITH PROPOFOL N/A 02/16/2017   Procedure: ESOPHAGOGASTRODUODENOSCOPY (EGD) WITH PROPOFOL;  Surgeon: Scot Jun, MD;  Location: [facility] ENDOSCOPY;  Service: Endoscopy;  Laterality: N/A;   ESOPHAGOGASTRODUODENOSCOPY (EGD) WITH PROPOFOL N/A 08/03/2019   Procedure: ESOPHAGOGASTRODUODENOSCOPY (EGD) WITH PROPOFOL;  Surgeon: Toledo, Boykin Nearing, MD;  Location: [facility] ENDOSCOPY;  Service: Gastroenterology;  Laterality: N/A;   LUMBAR FUSION  2006   MASTECTOMY PARTIAL / LUMPECTOMY Right 12/06/2020   REPAIR ROTATOR CUFF TEAR     TOTAL KNEE ARTHROPLASTY Bilateral 1999   UPPER ESOPHAGEAL ENDOSCOPIC ULTRASOUND (EUS) N/A 01/03/2016   Procedure: UPPER ESOPHAGEAL ENDOSCOPIC ULTRASOUND (EUS);  Surgeon: Bearl Mulberry, MD;  Location: [facility] ENDOSCOPY;  Service: Gastroenterology;  Laterality: N/A;   Family History  Problem Relation Age of Onset   Diabetes Mother    Diabetes Sister    Social History   Tobacco Use   Smoking status: Every Day    Current packs/day: 0.40    Types: Cigarettes   Smokeless tobacco: Never  Vaping Use   Vaping status: Never Used  Substance Use Topics  Alcohol use: No   Drug use: No    Pertinent Clinical Results:  LABS:   No visits with results within 3 Day(s) from this visit.  Latest known visit with results is:  Hospital Outpatient Visit on 05/22/2023  Component Date Value Ref Range Status   MRSA, PCR 05/22/2023 NEGATIVE  NEGATIVE Final   Staphylococcus aureus 05/22/2023 NEGATIVE  NEGATIVE Final   Comment: (NOTE) The Xpert SA Assay (FDA approved for NASAL specimens in patients 15 years of age and older), is one component of a comprehensive surveillance program. It is not intended to diagnose infection nor to guide or monitor treatment. Performed at [facility], [address] [place], [place]  [address]    WBC 05/22/2023 5.6  4.0 - 10.5 K/uL Final   RBC 05/22/2023 4.56  3.87 - 5.11 MIL/uL Final   Hemoglobin 05/22/2023 12.4  12.0 - 15.0 g/dL Final   HCT 60/45/4098 37.1  36.0 - 46.0 % Final   MCV 05/22/2023 81.4  80.0 - 100.0 fL Final   MCH 05/22/2023 27.2  26.0 - 34.0 pg Final   MCHC 05/22/2023 33.4  30.0 - 36.0 g/dL Final   RDW 11/91/4782 13.4  11.5 - 15.5 % Final   Platelets 05/22/2023 246  150 - 400 K/uL Final   nRBC 05/22/2023 0.0  0.0 - 0.2 % Final   Neutrophils Relative % 05/22/2023 58  % Final   Neutro Abs 05/22/2023 3.3  1.7 - 7.7 K/uL Final   Lymphocytes Relative 05/22/2023 32  % Final   Lymphs Abs 05/22/2023 1.8  0.7 - 4.0 K/uL Final   Monocytes Relative 05/22/2023 7  % Final   Monocytes Absolute 05/22/2023 0.4  0.1 - 1.0 K/uL Final   Eosinophils Relative 05/22/2023 2  % Final   Eosinophils Absolute 05/22/2023 0.1  0.0 - 0.5 K/uL Final   Basophils Relative 05/22/2023 0  % Final   Basophils Absolute 05/22/2023 0.0  0.0 - 0.1 K/uL Final   Immature Granulocytes 05/22/2023 1  % Final   Abs Immature Granulocytes 05/22/2023 0.03  0.00 - 0.07 K/uL Final   Performed at [facility], [address] [place], [place] [address]   Sodium 05/22/2023 137  135 - 145 mmol/L Final   Potassium 05/22/2023 3.3 (L)  3.5 - 5.1 mmol/L Final   Chloride 05/22/2023 104  98 - 111 mmol/L Final   CO2 05/22/2023 25  22 - 32 mmol/L Final   Glucose, Bld 05/22/2023 118 (H)  70 - 99 mg/dL Final   Glucose reference range applies only to samples taken after fasting for at least 8 hours.   BUN 05/22/2023 <5 (L)  8 - 23 mg/dL Final   Creatinine, Ser 05/22/2023 0.72  0.44 - 1.00 mg/dL Final   Calcium 30/86/5784 9.7  8.9 - 10.3 mg/dL Final   Total Protein 69/62/9528 7.3  6.5 - 8.1 g/dL Final   Albumin 41/32/4401 3.6  3.5 - 5.0 g/dL Final   AST 02/72/5366 23  15 - 41 U/L Final   ALT 05/22/2023 19  0 - 44 U/L Final   Alkaline Phosphatase 05/22/2023 67  38 - 126 U/L Final   Total Bilirubin  05/22/2023 0.8  0.3 - 1.2 mg/dL Final   GFR, Estimated 05/22/2023 >60  >60 mL/min Final   Comment: (NOTE) Calculated using the CKD-EPI Creatinine Equation (2021)    Anion gap 05/22/2023 8  5 - 15 Final   Performed at [facility], [address] [place], [place]  [address]   Color, Urine 05/22/2023 YELLOW (A)  YELLOW Final   APPearance 05/22/2023 CLEAR (A)  CLEAR Final   Specific Gravity, Urine 05/22/2023 1.004 (L)  1.005 - 1.030 Final   pH 05/22/2023 6.0  5.0 - 8.0 Final   Glucose, UA 05/22/2023 50 (A)  NEGATIVE mg/dL Final   Hgb urine dipstick 05/22/2023 NEGATIVE  NEGATIVE Final   Bilirubin Urine 05/22/2023 NEGATIVE  NEGATIVE Final   Ketones, ur 05/22/2023 NEGATIVE  NEGATIVE mg/dL Final   Protein, ur 13/05/6577 NEGATIVE  NEGATIVE mg/dL Final   Nitrite 46/96/2952 NEGATIVE  NEGATIVE Final   Leukocytes,Ua 05/22/2023 NEGATIVE  NEGATIVE Final   Performed at [facility], [address] [place] [place], [place] [address]   ABO/RH(D) 05/22/2023 O POS   Final   Antibody Screen 05/22/2023 NEG   Final   Sample Expiration 05/22/2023 06/05/2023,2359   Final   Extend sample reason 05/22/2023    Final                   Value:NO TRANSFUSIONS OR PREGNANCY IN THE PAST 3 MONTHS Performed at [facility], [address] [place], [place] [address]    Cotinine Ql Scrn, Ur 05/22/2023 Negative  Cutoff=300 ng/mL Final   Drug Screen Comment: 05/22/2023 Comment   Final   Comment: (NOTE) This analysis is performed by immunoassay. Positive findings are unconfirmed analytical test results; if results do not support expected clinical finding, confirmation by an alternate methodology is recommended. Patient metabolic variables, specific drug chemistry, and specimen characteristics can affect test outcome. Technical consultation is available at [email or web address], or call toll free [phone]. Performed At: [facility] [address] [place], [place] [id number] Jolene Schimke MD LO:[id number] Performed At: [facility] [address] [place], [place] [id number] Avis Epley PhD KZ:[id number]     ECG: Date: 05/22/2023 Time ECG obtained: 1127 AM Rate: 59 bpm Rhythm: sinus bradycardia; LAFB Intervals: PR 170 ms. QRS 102 ms. QTc 419 ms. ST segment and T wave changes: Inferior and anterolateral T wave abnormalities  Comparison: ECG repeated by cardiology on 05/28/2023 with similar changes being noted. Cardiology advising changes were consistent with previous tracing done in there office in 10/2021 (unable to view).    IMAGING / PROCEDURES: MR FEMUR LEFT WO CONTRAST performed on 03/27/2023 Severe advanced osteoarthritis of the left hip. Mild osteoarthritis of the right hip. No acute osseous injury of the left femur.  PULMONARY FUNCTION TESTING performed on 02/16/2023 SPIROMETRY: FVC was 1.73 liters, 64% of predicted/Post 1.75, 64%, 1% Change FEV1 was 1.42, 69% of predicted/Post 1.54, 74%, 8% Change FEV1 ratio was 85.44/Post 88.01 FEF 25-75% liters per second was 116% of predicted/Post 131%, 13% Change\ *Inhaled Albuterol 2.5 mg/36mL given for post spirometry LUNG VOLUMES: TLC was 80% of predicted RV was 106% of predicted DIFFUSION CAPACITY: DLCO was 69% of predicted DLCO/VA was 107% of predicted   CT CHEST LUNG CA SCREEN LOW DOSE W/O CM performed on 01/30/2023 Lung-RADS 2, benign appearance or behavior. Continue annual screening with low-dose chest CT without contrast in 12 months. Hepatic steatosis. Moderate hiatal hernia. Aortic atherosclerosis  Emphysema  TRANSTHORACIC ECHOCARDIOGRAM performed on 06/26/2022 Normal left ventricular systolic function with an EF >55%. No regional wall motion abnormalities Mild LVH Left ventricular diastolic Doppler parameters consistent with abnormal relaxation (G1DD). Trivial mitral and tricuspid valve regurgitation Mild mitral valve regurgitation Normal gradients; no valvular stenosis RVSP = 20.5  mmHg No pericardial effusion  MYOCARDIAL PERFUSION IMAGING STUDY (LEXISCAN) performed on 06/18/2022  Normal left ventricular systolic function with an EF of 59% No regional wall motion abnormalities SPECT images demonstrate homogenous tracer distribution throughout the myocardium No evidence of stress-induced myocardial ischemia or arrhythmia; no scintigraphic evidence of scar Indeterminate risk scan.  Impression and Plan:  Lynn Potter has been referred for pre-anesthesia review and clearance prior to her undergoing the planned anesthetic and procedural courses. Available labs, pertinent testing, and imaging results were personally reviewed by me in preparation for upcoming operative/procedural course. [facility] medical record has been updated following extensive record review and patient interview with PAT staff.   This patient has been appropriately cleared by cardiology with an overall LOW risk of experiencing significant perioperative cardiovascular complications. Based on clinical review performed today (06/02/23), barring any significant acute changes in the patient's overall condition, it is anticipated that she will be able to proceed with the planned surgical intervention. Any acute changes in clinical condition may necessitate her procedure being postponed and/or cancelled. Patient will meet with anesthesia team (MD and/or CRNA) on the day of her procedure for preoperative evaluation/assessment. Questions regarding anesthetic course will be fielded at that time.   Pre-surgical instructions were reviewed with the patient during her PAT appointment, and questions were fielded to satisfaction by PAT clinical staff. She has been instructed on which medications that she will need to hold prior to surgery, as well as the ones that have been deemed safe/appropriate to take on the day of her procedure. As part of the general education provided by PAT, patient made aware both verbally and in  writing, that she would need to abstain from the use of any illegal substances during her perioperative course.  She was advised that failure to follow the provided instructions could necessitate case cancellation or result in serious perioperative complications up to and including death. Patient encouraged to contact PAT and/or her surgeon's office to discuss any questions or concerns that may arise prior to surgery; verbalized understanding.   Quentin Mulling, MSN, APRN, FNP-C, CEN [facility]  Peri-operative Services Nurse Practitioner Phone: [phone] Fax: [phone] Email: [email or web address] 06/02/23 8:23 AM  NOTE: This note has been prepared using Scientist, clinical (histocompatibility and immunogenetics). Despite my best ability to proofread, there is always the potential that unintentional transcriptional errors may still occur from this process.

## 2023-06-02 ENCOUNTER — Encounter: Payer: Self-pay | Admitting: Orthopedic Surgery

## 2023-06-03 MED ORDER — DEXAMETHASONE SODIUM PHOSPHATE 10 MG/ML IJ SOLN
8.0000 mg | Freq: Once | INTRAMUSCULAR | Status: AC
  Administered 2023-06-04: 8 mg via INTRAVENOUS

## 2023-06-03 MED ORDER — CHLORHEXIDINE GLUCONATE 0.12 % MT SOLN
15.0000 mL | Freq: Once | OROMUCOSAL | Status: AC
  Administered 2023-06-04: 15 mL via OROMUCOSAL

## 2023-06-03 MED ORDER — CEFAZOLIN SODIUM-DEXTROSE 2-4 GM/100ML-% IV SOLN
2.0000 g | INTRAVENOUS | Status: AC
  Administered 2023-06-04: 2 g via INTRAVENOUS

## 2023-06-03 MED ORDER — ORAL CARE MOUTH RINSE: 15.0000 mL | Freq: Once | OROMUCOSAL | Status: AC

## 2023-06-03 MED ORDER — TRANEXAMIC ACID-NACL 1000-0.7 MG/100ML-% IV SOLN
1000.0000 mg | INTRAVENOUS | Status: AC
  Administered 2023-06-04 (×2): 1000 mg via INTRAVENOUS

## 2023-06-03 MED ORDER — SODIUM CHLORIDE 0.9 % IV SOLN: INTRAVENOUS | Status: DC

## 2023-06-04 ENCOUNTER — Encounter
Admission: RE | Disposition: A | Discharge status: Skilled Nursing Facility | Payer: Self-pay | Source: Home / Self Care | Attending: Orthopedic Surgery

## 2023-06-04 ENCOUNTER — Other Ambulatory Visit: Payer: Self-pay

## 2023-06-04 ENCOUNTER — Ambulatory Visit: Payer: 59

## 2023-06-04 ENCOUNTER — Observation Stay
Admission: RE | Admit: 2023-06-04 | Discharge: 2023-06-09 | Disposition: A | Discharge status: Skilled Nursing Facility | Payer: 59 | Attending: Orthopedic Surgery | Admitting: Orthopedic Surgery

## 2023-06-04 ENCOUNTER — Encounter: Payer: Self-pay | Admitting: Orthopedic Surgery

## 2023-06-04 ENCOUNTER — Ambulatory Visit: Payer: 59 | Admitting: Urgent Care

## 2023-06-04 DIAGNOSIS — Z96653 Presence of artificial knee joint, bilateral: Secondary | ICD-10-CM | POA: Diagnosis not present

## 2023-06-04 DIAGNOSIS — M1612 Unilateral primary osteoarthritis, left hip: Principal | ICD-10-CM | POA: Insufficient documentation

## 2023-06-04 DIAGNOSIS — Z7984 Long term (current) use of oral hypoglycemic drugs: Secondary | ICD-10-CM | POA: Insufficient documentation

## 2023-06-04 DIAGNOSIS — I2510 Atherosclerotic heart disease of native coronary artery without angina pectoris: Secondary | ICD-10-CM | POA: Diagnosis not present

## 2023-06-04 DIAGNOSIS — E119 Type 2 diabetes mellitus without complications: Secondary | ICD-10-CM | POA: Diagnosis not present

## 2023-06-04 DIAGNOSIS — Z794 Long term (current) use of insulin: Secondary | ICD-10-CM

## 2023-06-04 DIAGNOSIS — I482 Chronic atrial fibrillation, unspecified: Secondary | ICD-10-CM

## 2023-06-04 DIAGNOSIS — Z79899 Other long term (current) drug therapy: Secondary | ICD-10-CM | POA: Insufficient documentation

## 2023-06-04 DIAGNOSIS — I10 Essential (primary) hypertension: Secondary | ICD-10-CM | POA: Diagnosis not present

## 2023-06-04 DIAGNOSIS — Z7982 Long term (current) use of aspirin: Secondary | ICD-10-CM | POA: Diagnosis not present

## 2023-06-04 DIAGNOSIS — I4891 Unspecified atrial fibrillation: Secondary | ICD-10-CM | POA: Insufficient documentation

## 2023-06-04 DIAGNOSIS — J45909 Unspecified asthma, uncomplicated: Secondary | ICD-10-CM | POA: Diagnosis not present

## 2023-06-04 DIAGNOSIS — Z01818 Encounter for other preprocedural examination: Secondary | ICD-10-CM

## 2023-06-04 DIAGNOSIS — J449 Chronic obstructive pulmonary disease, unspecified: Secondary | ICD-10-CM | POA: Insufficient documentation

## 2023-06-04 DIAGNOSIS — G9340 Encephalopathy, unspecified: Secondary | ICD-10-CM | POA: Insufficient documentation

## 2023-06-04 DIAGNOSIS — E871 Hypo-osmolality and hyponatremia: Secondary | ICD-10-CM | POA: Insufficient documentation

## 2023-06-04 DIAGNOSIS — E785 Hyperlipidemia, unspecified: Secondary | ICD-10-CM

## 2023-06-04 HISTORY — DX: Long term (current) use of aspirin: Z79.82

## 2023-06-04 HISTORY — DX: Carpal tunnel syndrome, bilateral upper limbs: G56.03

## 2023-06-04 HISTORY — PX: TOTAL HIP ARTHROPLASTY: SHX124

## 2023-06-04 HISTORY — DX: Hyperlipidemia, unspecified: E78.5

## 2023-06-04 HISTORY — DX: Atherosclerosis of aorta: I70.0

## 2023-06-04 HISTORY — DX: Spinal stenosis, lumbar region without neurogenic claudication: M48.061

## 2023-06-04 HISTORY — DX: Other specified postprocedural states: Z98.890

## 2023-06-04 HISTORY — DX: Other intervertebral disc degeneration, lumbosacral region: M51.37

## 2023-06-04 HISTORY — DX: Deficiency of other specified B group vitamins: E53.8

## 2023-06-04 HISTORY — DX: Type 2 diabetes mellitus without complications: E11.9

## 2023-06-04 HISTORY — DX: Insomnia, unspecified: G47.00

## 2023-06-04 HISTORY — DX: Other intervertebral disc degeneration, lumbosacral region without mention of lumbar back pain or lower extremity pain: M51.379

## 2023-06-04 HISTORY — DX: Other forms of dyspnea: R06.09

## 2023-06-04 HISTORY — DX: Chronic obstructive pulmonary disease, unspecified: J44.9

## 2023-06-04 HISTORY — DX: Left anterior fascicular block: I44.4

## 2023-06-04 HISTORY — DX: Other cerebrovascular disease: I67.89

## 2023-06-04 HISTORY — DX: Restless legs syndrome: G25.81

## 2023-06-04 HISTORY — DX: Atherosclerotic heart disease of native coronary artery without angina pectoris: I25.10

## 2023-06-04 HISTORY — DX: Fatty (change of) liver, not elsewhere classified: K76.0

## 2023-06-04 HISTORY — DX: Iron deficiency anemia, unspecified: D50.9

## 2023-06-04 HISTORY — DX: Type 2 diabetes mellitus with diabetic polyneuropathy: E11.42

## 2023-06-04 HISTORY — DX: Obstructive sleep apnea (adult) (pediatric): G47.33

## 2023-06-04 LAB — GLUCOSE, CAPILLARY
Glucose-Capillary: 127 mg/dL — ABNORMAL HIGH (ref 70–99)
Glucose-Capillary: 178 mg/dL — ABNORMAL HIGH (ref 70–99)
Glucose-Capillary: 148 mg/dL — ABNORMAL HIGH (ref 70–99)
Glucose-Capillary: 174 mg/dL — ABNORMAL HIGH (ref 70–99)
Glucose-Capillary: 157 mg/dL — ABNORMAL HIGH (ref 70–99)

## 2023-06-04 LAB — ABO/RH: ABO/RH(D): O POS

## 2023-06-04 SURGERY — ARTHROPLASTY, HIP, TOTAL, ANTERIOR APPROACH
Anesthesia: Spinal | Site: Hip | Laterality: Left

## 2023-06-04 MED ORDER — LIDOCAINE HCL (CARDIAC) PF 100 MG/5ML IV SOSY
PREFILLED_SYRINGE | INTRAVENOUS | Status: DC | PRN
  Administered 2023-06-04: 60 mg via INTRAVENOUS

## 2023-06-04 MED ORDER — FENTANYL CITRATE (PF) 100 MCG/2ML IJ SOLN
INTRAMUSCULAR | Status: AC
  Filled 2023-06-04: qty 2

## 2023-06-04 MED ORDER — FENTANYL CITRATE (PF) 100 MCG/2ML IJ SOLN
25.0000 ug | INTRAMUSCULAR | Status: DC | PRN
  Administered 2023-06-04 (×2): 25 ug via INTRAVENOUS
  Administered 2023-06-04: 50 ug via INTRAVENOUS
  Administered 2023-06-04 (×2): 25 ug via INTRAVENOUS

## 2023-06-04 MED ORDER — TRANEXAMIC ACID-NACL 1000-0.7 MG/100ML-% IV SOLN
INTRAVENOUS | Status: AC
  Filled 2023-06-04: qty 100

## 2023-06-04 MED ORDER — OLMESARTAN MEDOXOMIL-HCTZ 40-25 MG PO TABS: 1.0000 | ORAL_TABLET | Freq: Every day | ORAL | Status: DC

## 2023-06-04 MED ORDER — METOCLOPRAMIDE HCL 5 MG PO TABS: 5.0000 mg | ORAL_TABLET | Freq: Three times a day (TID) | ORAL | Status: DC | PRN

## 2023-06-04 MED ORDER — ATORVASTATIN CALCIUM 20 MG PO TABS
ORAL_TABLET | ORAL | Status: AC
  Filled 2023-06-04: qty 2

## 2023-06-04 MED ORDER — HYDROCHLOROTHIAZIDE 25 MG PO TABS
ORAL_TABLET | ORAL | Status: AC
  Filled 2023-06-04: qty 2

## 2023-06-04 MED ORDER — ACETAMINOPHEN 10 MG/ML IV SOLN: INTRAVENOUS | Status: DC | PRN

## 2023-06-04 MED ORDER — CEFAZOLIN SODIUM-DEXTROSE 2-4 GM/100ML-% IV SOLN
INTRAVENOUS | Status: AC
  Filled 2023-06-04: qty 100

## 2023-06-04 MED ORDER — HYDROCHLOROTHIAZIDE 25 MG PO TABS
25.0000 mg | ORAL_TABLET | Freq: Every day | ORAL | Status: DC
  Administered 2023-06-04 – 2023-06-06 (×2): 25 mg via ORAL
  Filled 2023-06-04: qty 1

## 2023-06-04 MED ORDER — INSULIN ASPART 100 UNIT/ML IJ SOLN: 0.0000 [IU] | Freq: Every day | INTRAMUSCULAR | Status: DC

## 2023-06-04 MED ORDER — TRAZODONE HCL 50 MG PO TABS
50.0000 mg | ORAL_TABLET | Freq: Every day | ORAL | Status: DC
  Administered 2023-06-04 – 2023-06-06 (×3): 50 mg via ORAL
  Filled 2023-06-04 (×3): qty 1

## 2023-06-04 MED ORDER — ONDANSETRON HCL 4 MG/2ML IJ SOLN
INTRAMUSCULAR | Status: AC
  Filled 2023-06-04: qty 2

## 2023-06-04 MED ORDER — INSULIN ASPART 100 UNIT/ML IJ SOLN
INTRAMUSCULAR | Status: AC
  Filled 2023-06-04: qty 1

## 2023-06-04 MED ORDER — TRAMADOL HCL 50 MG PO TABS
50.0000 mg | ORAL_TABLET | Freq: Four times a day (QID) | ORAL | Status: DC | PRN
  Administered 2023-06-05 – 2023-06-08 (×6): 50 mg via ORAL
  Filled 2023-06-04 (×6): qty 1

## 2023-06-04 MED ORDER — LIDOCAINE HCL (PF) 2 % IJ SOLN
INTRAMUSCULAR | Status: AC
  Filled 2023-06-04: qty 5

## 2023-06-04 MED ORDER — PANTOPRAZOLE SODIUM 40 MG PO TBEC
40.0000 mg | DELAYED_RELEASE_TABLET | Freq: Every day | ORAL | Status: DC
  Administered 2023-06-05 – 2023-06-08 (×4): 40 mg via ORAL
  Filled 2023-06-04 (×4): qty 1

## 2023-06-04 MED ORDER — BUPIVACAINE LIPOSOME 1.3 % IJ SUSP
INTRAMUSCULAR | Status: AC
  Filled 2023-06-04: qty 40

## 2023-06-04 MED ORDER — PHENOL 1.4 % MT LIQD: 1.0000 | OROMUCOSAL | Status: DC | PRN

## 2023-06-04 MED ORDER — FENTANYL CITRATE (PF) 100 MCG/2ML IJ SOLN
INTRAMUSCULAR | Status: DC | PRN
  Administered 2023-06-04 (×3): 50 ug via INTRAVENOUS

## 2023-06-04 MED ORDER — DEXAMETHASONE SODIUM PHOSPHATE 10 MG/ML IJ SOLN
INTRAMUSCULAR | Status: AC
  Filled 2023-06-04: qty 1

## 2023-06-04 MED ORDER — DOCUSATE SODIUM 100 MG PO CAPS
ORAL_CAPSULE | ORAL | Status: AC
  Filled 2023-06-04: qty 1

## 2023-06-04 MED ORDER — ACETAMINOPHEN 10 MG/ML IV SOLN
INTRAVENOUS | Status: AC
  Filled 2023-06-04: qty 100

## 2023-06-04 MED ORDER — OXYCODONE HCL 5 MG PO TABS
5.0000 mg | ORAL_TABLET | Freq: Once | ORAL | Status: AC | PRN
  Administered 2023-06-04: 5 mg via ORAL

## 2023-06-04 MED ORDER — PHENYLEPHRINE 80 MCG/ML (10ML) SYRINGE FOR IV PUSH (FOR BLOOD PRESSURE SUPPORT)
PREFILLED_SYRINGE | INTRAVENOUS | Status: AC
  Filled 2023-06-04: qty 10

## 2023-06-04 MED ORDER — MIDAZOLAM HCL 5 MG/5ML IJ SOLN
INTRAMUSCULAR | Status: DC | PRN
  Administered 2023-06-04 (×2): 1 mg via INTRAVENOUS

## 2023-06-04 MED ORDER — ONDANSETRON HCL 4 MG/2ML IJ SOLN
4.0000 mg | Freq: Four times a day (QID) | INTRAMUSCULAR | Status: DC | PRN
  Administered 2023-06-04: 4 mg via INTRAVENOUS

## 2023-06-04 MED ORDER — PROPOFOL 10 MG/ML IV BOLUS
INTRAVENOUS | Status: DC | PRN
Start: 2023-06-04 — End: 2023-06-04
  Administered 2023-06-04: 25 mg via INTRAVENOUS
  Administered 2023-06-04: 100 mg via INTRAVENOUS
  Administered 2023-06-04: 25 ug/kg/min via INTRAVENOUS

## 2023-06-04 MED ORDER — MENTHOL 3 MG MT LOZG: 1.0000 | LOZENGE | OROMUCOSAL | Status: DC | PRN

## 2023-06-04 MED ORDER — ONDANSETRON HCL 4 MG PO TABS: 4.0000 mg | ORAL_TABLET | Freq: Four times a day (QID) | ORAL | Status: DC | PRN

## 2023-06-04 MED ORDER — ROPINIROLE HCL 1 MG PO TABS
1.0000 mg | ORAL_TABLET | Freq: Every day | ORAL | Status: DC
  Administered 2023-06-04 – 2023-06-06 (×3): 1 mg via ORAL
  Filled 2023-06-04 (×3): qty 1

## 2023-06-04 MED ORDER — ACETAMINOPHEN 500 MG PO TABS
ORAL_TABLET | ORAL | Status: AC
  Filled 2023-06-04: qty 2

## 2023-06-04 MED ORDER — PROPOFOL 1000 MG/100ML IV EMUL
INTRAVENOUS | Status: AC
  Filled 2023-06-04: qty 100

## 2023-06-04 MED ORDER — BUPIVACAINE-EPINEPHRINE (PF) 0.25% -1:200000 IJ SOLN
INTRAMUSCULAR | Status: AC
  Filled 2023-06-04: qty 60

## 2023-06-04 MED ORDER — PHENYLEPHRINE HCL-NACL 20-0.9 MG/250ML-% IV SOLN
INTRAVENOUS | Status: AC
  Filled 2023-06-04: qty 250

## 2023-06-04 MED ORDER — GABAPENTIN 300 MG PO CAPS
300.0000 mg | ORAL_CAPSULE | Freq: Three times a day (TID) | ORAL | Status: DC
  Administered 2023-06-04 – 2023-06-06 (×6): 300 mg via ORAL
  Filled 2023-06-04 (×4): qty 1

## 2023-06-04 MED ORDER — ROCURONIUM BROMIDE 100 MG/10ML IV SOLN
INTRAVENOUS | Status: DC | PRN
  Administered 2023-06-04: 20 mg via INTRAVENOUS
  Administered 2023-06-04: 50 mg via INTRAVENOUS

## 2023-06-04 MED ORDER — IRBESARTAN 150 MG PO TABS
300.0000 mg | ORAL_TABLET | Freq: Every day | ORAL | Status: DC
  Administered 2023-06-04 – 2023-06-08 (×4): 300 mg via ORAL
  Filled 2023-06-04 (×6): qty 2

## 2023-06-04 MED ORDER — ONDANSETRON HCL 4 MG/2ML IJ SOLN
INTRAMUSCULAR | Status: DC | PRN
  Administered 2023-06-04: 4 mg via INTRAVENOUS

## 2023-06-04 MED ORDER — PHENYLEPHRINE 80 MCG/ML (10ML) SYRINGE FOR IV PUSH (FOR BLOOD PRESSURE SUPPORT)
PREFILLED_SYRINGE | INTRAVENOUS | Status: DC | PRN
  Administered 2023-06-04: 80 ug via INTRAVENOUS

## 2023-06-04 MED ORDER — SURGIPHOR WOUND IRRIGATION SYSTEM - OPTIME: TOPICAL | Status: DC | PRN

## 2023-06-04 MED ORDER — KETOROLAC TROMETHAMINE 15 MG/ML IJ SOLN
15.0000 mg | Freq: Four times a day (QID) | INTRAMUSCULAR | Status: AC
  Administered 2023-06-04 – 2023-06-05 (×3): 15 mg via INTRAVENOUS

## 2023-06-04 MED ORDER — SODIUM CHLORIDE 0.9 % IV SOLN: INTRAVENOUS | Status: DC

## 2023-06-04 MED ORDER — SODIUM CHLORIDE FLUSH 0.9 % IV SOLN
INTRAVENOUS | Status: AC
  Filled 2023-06-04: qty 20

## 2023-06-04 MED ORDER — CHLORHEXIDINE GLUCONATE 0.12 % MT SOLN
OROMUCOSAL | Status: AC
  Filled 2023-06-04: qty 15

## 2023-06-04 MED ORDER — SUGAMMADEX SODIUM 200 MG/2ML IV SOLN
INTRAVENOUS | Status: DC | PRN
  Administered 2023-06-04: 200 mg via INTRAVENOUS

## 2023-06-04 MED ORDER — KETOROLAC TROMETHAMINE 15 MG/ML IJ SOLN
INTRAMUSCULAR | Status: AC
  Filled 2023-06-04: qty 1

## 2023-06-04 MED ORDER — MORPHINE SULFATE (PF) 4 MG/ML IV SOLN: 0.5000 mg | INTRAVENOUS | Status: DC | PRN

## 2023-06-04 MED ORDER — METOCLOPRAMIDE HCL 5 MG/ML IJ SOLN: 5.0000 mg | Freq: Three times a day (TID) | INTRAMUSCULAR | Status: DC | PRN

## 2023-06-04 MED ORDER — CALCIUM CARBONATE 1250 (500 CA) MG PO TABS
1.0000 | ORAL_TABLET | Freq: Every day | ORAL | Status: DC
  Administered 2023-06-05 – 2023-06-08 (×3): 1250 mg via ORAL
  Filled 2023-06-04 (×7): qty 1

## 2023-06-04 MED ORDER — FENTANYL CITRATE (PF) 250 MCG/5ML IJ SOLN
INTRAMUSCULAR | Status: AC
  Filled 2023-06-04: qty 5

## 2023-06-04 MED ORDER — HYDROCODONE-ACETAMINOPHEN 5-325 MG PO TABS
1.0000 | ORAL_TABLET | ORAL | Status: DC | PRN
  Administered 2023-06-04: 1 via ORAL

## 2023-06-04 MED ORDER — ATORVASTATIN CALCIUM 20 MG PO TABS
40.0000 mg | ORAL_TABLET | Freq: Every day | ORAL | Status: DC
  Administered 2023-06-04 – 2023-06-05 (×2): 40 mg via ORAL

## 2023-06-04 MED ORDER — INSULIN ASPART 100 UNIT/ML IJ SOLN
0.0000 [IU] | Freq: Three times a day (TID) | INTRAMUSCULAR | Status: DC
  Administered 2023-06-04: 3 [IU] via SUBCUTANEOUS
  Administered 2023-06-06 – 2023-06-09 (×5): 2 [IU] via SUBCUTANEOUS
  Filled 2023-06-04 (×5): qty 1

## 2023-06-04 MED ORDER — SODIUM CHLORIDE 0.9 % IR SOLN
Status: DC | PRN
  Administered 2023-06-04: 100 mL

## 2023-06-04 MED ORDER — MIDAZOLAM HCL 2 MG/2ML IJ SOLN
INTRAMUSCULAR | Status: AC
  Filled 2023-06-04: qty 2

## 2023-06-04 MED ORDER — 0.9 % SODIUM CHLORIDE (POUR BTL) OPTIME
TOPICAL | Status: DC | PRN
  Administered 2023-06-04: 500 mL

## 2023-06-04 MED ORDER — DOCUSATE SODIUM 100 MG PO CAPS
100.0000 mg | ORAL_CAPSULE | Freq: Two times a day (BID) | ORAL | Status: DC
  Administered 2023-06-04 – 2023-06-08 (×6): 100 mg via ORAL
  Filled 2023-06-04 (×6): qty 1

## 2023-06-04 MED ORDER — ACETAMINOPHEN 10 MG/ML IV SOLN
INTRAVENOUS | Status: DC | PRN
  Administered 2023-06-04: 1000 mg via INTRAVENOUS

## 2023-06-04 MED ORDER — ACETAMINOPHEN 500 MG PO TABS
1000.0000 mg | ORAL_TABLET | Freq: Three times a day (TID) | ORAL | Status: AC
  Administered 2023-06-04 – 2023-06-05 (×2): 1000 mg via ORAL

## 2023-06-04 MED ORDER — OXYCODONE HCL 5 MG PO TABS
ORAL_TABLET | ORAL | Status: AC
  Filled 2023-06-04: qty 1

## 2023-06-04 MED ORDER — LATANOPROST 0.005 % OP SOLN
1.0000 [drp] | Freq: Every day | OPHTHALMIC | Status: DC
  Administered 2023-06-04 – 2023-06-07 (×4): 1 [drp] via OPHTHALMIC
  Filled 2023-06-04: qty 2.5

## 2023-06-04 MED ORDER — ASPIRIN 81 MG PO TBEC
81.0000 mg | DELAYED_RELEASE_TABLET | Freq: Every day | ORAL | Status: DC
  Administered 2023-06-05 – 2023-06-08 (×4): 81 mg via ORAL
  Filled 2023-06-04 (×6): qty 1

## 2023-06-04 MED ORDER — SODIUM CHLORIDE (PF) 0.9 % IJ SOLN
INTRAMUSCULAR | Status: DC | PRN
  Administered 2023-06-04: 50 mL via INTRAMUSCULAR

## 2023-06-04 MED ORDER — HYDROCODONE-ACETAMINOPHEN 5-325 MG PO TABS
ORAL_TABLET | ORAL | Status: AC
  Filled 2023-06-04: qty 1

## 2023-06-04 MED ORDER — OXYCODONE HCL 5 MG/5ML PO SOLN: 5.0000 mg | Freq: Once | ORAL | Status: AC | PRN

## 2023-06-04 MED ORDER — GABAPENTIN 300 MG PO CAPS
ORAL_CAPSULE | ORAL | Status: AC
  Filled 2023-06-04: qty 1

## 2023-06-04 MED ORDER — ENOXAPARIN SODIUM 40 MG/0.4ML IJ SOSY
40.0000 mg | PREFILLED_SYRINGE | INTRAMUSCULAR | Status: DC
  Administered 2023-06-05 – 2023-06-08 (×4): 40 mg via SUBCUTANEOUS
  Filled 2023-06-04 (×4): qty 0.4

## 2023-06-04 MED ORDER — LACTATED RINGERS IV SOLN: INTRAVENOUS | Status: DC | PRN

## 2023-06-04 MED ORDER — CEFAZOLIN SODIUM-DEXTROSE 2-4 GM/100ML-% IV SOLN
2.0000 g | Freq: Four times a day (QID) | INTRAVENOUS | Status: AC
  Administered 2023-06-04 (×2): 2 g via INTRAVENOUS
  Filled 2023-06-04 (×2): qty 100

## 2023-06-04 MED ORDER — PROPOFOL 10 MG/ML IV BOLUS
INTRAVENOUS | Status: AC
  Filled 2023-06-04: qty 20

## 2023-06-04 MED ORDER — DULOXETINE HCL 30 MG PO CPEP
30.0000 mg | ORAL_CAPSULE | Freq: Every morning | ORAL | Status: DC
  Administered 2023-06-04 – 2023-06-06 (×3): 30 mg via ORAL
  Filled 2023-06-04 (×4): qty 1

## 2023-06-04 SURGICAL SUPPLY — 75 items
ADH SKN CLS APL DERMABOND .7 (GAUZE/BANDAGES/DRESSINGS) ×1
AGENT HMST KT MTR STRL THRMB (HEMOSTASIS)
APL PRP STRL LF DISP 70% ISPRP (MISCELLANEOUS) ×1
BLADE CLIPPER SURG (BLADE) IMPLANT
BLADE SAGITTAL AGGR TOOTH XLG (BLADE) ×1 IMPLANT
BNDG CMPR 5X6 CHSV STRCH STRL (GAUZE/BANDAGES/DRESSINGS) ×2
BNDG COHESIVE 6X5 TAN ST LF (GAUZE/BANDAGES/DRESSINGS) ×2 IMPLANT
CHLORAPREP W/TINT 26 (MISCELLANEOUS) ×1 IMPLANT
DERMABOND ADVANCED .7 DNX12 (GAUZE/BANDAGES/DRESSINGS) ×1 IMPLANT
DRAPE C-ARM XRAY 36X54 (DRAPES) ×1 IMPLANT
DRAPE POUCH INSTRU U-SHP 10X18 (DRAPES) ×1 IMPLANT
DRAPE SHEET LG 3/4 BI-LAMINATE (DRAPES) ×1 IMPLANT
DRAPE TABLE BACK 80X90 (DRAPES) ×1 IMPLANT
DRSG MEPILEX SACRM 8.7X9.8 (GAUZE/BANDAGES/DRESSINGS) ×1 IMPLANT
DRSG OPSITE POSTOP 4X8 (GAUZE/BANDAGES/DRESSINGS) ×1 IMPLANT
ELECT BLADE 4.0 EZ CLEAN MEGAD (MISCELLANEOUS) ×1
ELECT REM PT RETURN 9FT ADLT (ELECTROSURGICAL) ×1
ELECTRODE BLDE 4.0 EZ CLN MEGD (MISCELLANEOUS) ×1 IMPLANT
ELECTRODE REM PT RTRN 9FT ADLT (ELECTROSURGICAL) ×1 IMPLANT
GLOVE BIO SURGEON STRL SZ8 (GLOVE) ×1 IMPLANT
GLOVE BIOGEL PI IND STRL 8 (GLOVE) ×1 IMPLANT
GLOVE PI ORTHO PRO STRL 7.5 (GLOVE) ×2 IMPLANT
GLOVE PI ORTHO PRO STRL SZ8 (GLOVE) ×2 IMPLANT
GLOVE SURG SYN 7.5 E (GLOVE) ×1 IMPLANT
GLOVE SURG SYN 7.5 PF PI (GLOVE) ×1 IMPLANT
GOWN SRG XL LVL 3 NONREINFORCE (GOWNS) ×1 IMPLANT
GOWN STRL NON-REIN TWL XL LVL3 (GOWNS) ×1
GOWN STRL REUS W/ TWL LRG LVL3 (GOWN DISPOSABLE) ×1 IMPLANT
GOWN STRL REUS W/ TWL XL LVL3 (GOWN DISPOSABLE) ×1 IMPLANT
GOWN STRL REUS W/TWL LRG LVL3 (GOWN DISPOSABLE) ×1
GOWN STRL REUS W/TWL XL LVL3 (GOWN DISPOSABLE) ×1
HANDLE YANKAUER SUCT OPEN TIP (MISCELLANEOUS) ×1 IMPLANT
HEAD HIP LFIT V40 22 (Head) IMPLANT
HIP HD LFIT V40 22 (Head) ×1 IMPLANT
HOLDER FOLEY CATH W/STRAP (MISCELLANEOUS) IMPLANT
HOOD PEEL AWAY T7 (MISCELLANEOUS) ×2 IMPLANT
IV NS 100ML SINGLE PACK (IV SOLUTION) ×1 IMPLANT
KIT PATIENT CARE HANA TABLE (KITS) ×1 IMPLANT
LIGHT WAVEGUIDE WIDE FLAT (MISCELLANEOUS) ×1 IMPLANT
LINER CEMENTLESS SZC 36 HIP (Liner) IMPLANT
LINER INSERT MEM 22.2X36 36C (Insert) IMPLANT
MANIFOLD NEPTUNE II (INSTRUMENTS) ×1 IMPLANT
MARKER SKIN DUAL TIP RULER LAB (MISCELLANEOUS) ×1 IMPLANT
MAT ABSORB FLUID 56X50 GRAY (MISCELLANEOUS) ×1 IMPLANT
NDL FILTER BLUNT 18X1 1/2 (NEEDLE) ×1 IMPLANT
NDL SAFETY ECLIP 18X1.5 (MISCELLANEOUS) ×1 IMPLANT
NDL SPNL 20GX3.5 QUINCKE YW (NEEDLE) ×1 IMPLANT
NEEDLE FILTER BLUNT 18X1 1/2 (NEEDLE) ×1 IMPLANT
NEEDLE SPNL 20GX3.5 QUINCKE YW (NEEDLE) ×1 IMPLANT
NS IRRIG 500ML POUR BTL (IV SOLUTION) ×1 IMPLANT
PACK HIP COMPR (MISCELLANEOUS) ×1 IMPLANT
PAD ARMBOARD 7.5X6 YLW CONV (MISCELLANEOUS) ×1 IMPLANT
SCREW HEX LP 6.5X15 (Screw) IMPLANT
SCREW HEX LP 6.5X20 (Screw) IMPLANT
SHELL ACETAB TRI HIP C 46 (Shell) IMPLANT
SLEEVE SCD COMPRESS KNEE MED (STOCKING) ×1 IMPLANT
SOLUTION IRRIG SURGIPHOR (IV SOLUTION) ×1 IMPLANT
STEM STD OFFSET SZ 1 30.5 (Stem) IMPLANT
SURGIFLO W/THROMBIN 8M KIT (HEMOSTASIS) IMPLANT
SUT BONE WAX W31G (SUTURE) ×1 IMPLANT
SUT DVC 2 QUILL PDO T11 36X36 (SUTURE) ×1 IMPLANT
SUT ETHIBOND 2 V 37 (SUTURE) ×1 IMPLANT
SUT QUILL MONODERM 3-0 PS-2 (SUTURE) ×1 IMPLANT
SUT SILK 0 (SUTURE) ×1
SUT SILK 0 30XBRD TIE 6 (SUTURE) ×1 IMPLANT
SUT VIC AB 0 CT1 36 (SUTURE) ×1 IMPLANT
SUT VIC AB 2-0 CT2 27 (SUTURE) ×2 IMPLANT
SYR 30ML LL (SYRINGE) ×2 IMPLANT
SYR TB 1ML LL NO SAFETY (SYRINGE) ×1 IMPLANT
TAPE MICROFOAM 4IN (TAPE) IMPLANT
TOWEL OR 17X26 4PK STRL BLUE (TOWEL DISPOSABLE) IMPLANT
TRAP FLUID SMOKE EVACUATOR (MISCELLANEOUS) ×1 IMPLANT
TRAY FOLEY SLVR 16FR LF STAT (SET/KITS/TRAYS/PACK) IMPLANT
WAND WEREWOLF FASTSEAL 6.0 (MISCELLANEOUS) ×1 IMPLANT
WATER STERILE IRR 1000ML POUR (IV SOLUTION) ×1 IMPLANT

## 2023-06-04 NOTE — Anesthesia Postprocedure Evaluation (Signed)
Anesthesia Post Note  Patient: Lynn Potter  Procedure(s) Performed: TOTAL HIP ARTHROPLASTY ANTERIOR APPROACH (Left: Hip)  Patient location during evaluation: PACU Anesthesia Type: Spinal Level of consciousness: awake and alert Pain management: pain level controlled Vital Signs Assessment: post-procedure vital signs reviewed and stable Respiratory status: spontaneous breathing, nonlabored ventilation, respiratory function stable and patient connected to nasal cannula oxygen Cardiovascular status: blood pressure returned to baseline and stable Postop Assessment: no apparent nausea or vomiting Anesthetic complications: no   No notable events documented.   Last Vitals:  Vitals:   06/04/23 1400 06/04/23 1420  BP: 116/84 110/78  Pulse: 74 77  Resp: (!) 7 16  Temp: (!) 35.9 C (!) 36.3 C  SpO2: 98% 100%    Last Pain:  Vitals:   06/04/23 1420  TempSrc: Oral  PainSc:                  Cleda Mccreedy Lynn Potter

## 2023-06-04 NOTE — Op Note (Signed)
Patient Name: Mistey Fimbres  GNF:[id number]  Pre-Operative Diagnosis: Left hip Osteoarthritis  Post-Operative Diagnosis: (same)  Procedure: Left Total Hip Arthroplasty  Components/Implants: Cup: Trident Tritanium Clusterhole 62mm/C w/x 2 screws    Liner: MDM 36/C  Stem: Inisgnia #1 Std Offset  Head:LFIT V40 22.2 +2 w/ X3 MDM liner 22.2/36  Date of Surgery: 06/04/2023  Surgeon: Reinaldo Berber MD  Assistant: Amador Cunas PA (present and scrubbed throughout the case, critical for assistance with exposure, retraction, instrumentation, and closure)   Anesthesiologist: Piscitello  Anesthesia:  General   EBL: 200cc  IVF:1000cc  Complications: None   Brief history: The patient is a 77 year old female with a history of osteoarthritis of the left hip with pain limiting their range of motion and activities of daily living, which has failed multiple attempts at conservative therapy.  The risks and benefits of total hip arthroplasty as definitive surgical treatment were discussed with the patient, who opted to proceed with the operation.  After outpatient medical clearance and optimization was completed the patient was admitted to [facility] for the procedure.  All preoperative films were reviewed and an appropriate surgical plan was made prior to surgery.   Description of procedure: The patient was brought to the operating room where laterality was confirmed by all those present to be the left side.  The patient was administered general anesthesia on a stretcher prior to being moved supine on the operating room table. Patient was given an intravenous dose of antibiotics for surgical prophylaxis and TXA.  All bony prominences and extremities were well padded and the patient was securely attached to the table boots, a perineal post was placed and the patient had a safety strap placed.  Surgical site was prepped with alcohol and chlorhexidine. The surgical site over the hip was  and draped in typical sterile fashion with multiple layers of adhesive and nonadhesive drapes.  The incision site was marked out with a sterile marker and care was taken to assess the position of the ASIS and ensure appropriate position for the incision.    A surgical timeout was then called with participation of all staff in the room the patient was then a confirmed again and laterality confirmed.  Incision was made over the anterior lateral aspect of the proximal thigh in line with the TFL.  Appropriate retractors were placed and all bleeding vessels were coagulated within the subcutaneous and fatty layers.  An incision was made in the TFL fascia in the interval was carefully identified.  The lateral ascending branches of the circumflex vessels were identified, cauterized and carefully dissected. The main vessels were then tied with a 0 silk hand tie.  Retractors were placed around the superior lateral and inferior medial aspects of the femoral neck and a capsulotomy was performed exposing the hip joint.  Retraction stitches were placed and the capsulotomy to assist with visualization.  Femoral neck cut was then made and the femoral head was extracted after placing the leg in traction.  Bone wax was then applied to the proximal cut surface of the femur and aqua mantis was used to address any bleeding around the femoral neck cut.  Retractors were then placed around the acetabulum to fully visualize the joint space, and the remaining labral tissue was removed and pulvinar was removed.   The acetabulum was then sequentially reamed up to the appropriate size in order to get good fit and fill for the acetabular component while under fluoroscopic guidance.  Acetabular component was then  placed and malleted into a secure fit while confirming position and abduction angle and anteversion utilizing fluoroscopy.  2 screws were then placed in the acetabular cup to assist in securing the cup in place. The cup was  irrigated,  a real MDM liner was placed, impacted, and checked for stability. Periacetabular osteophytes were removed to reduce any chances of impingement. The femur traction was dropped and sequentially externally rotated while performing a release of the posterior and superomedial tissues off of the proximal femur to allow for mobility, care was taken to preserve the external rotators and piriformis attachments.  The remaining interval between the abductors and the capsule was dissected out and a retractor was placed over the superolateral aspect of the femur over the greater trochanter.  The leg was carefully brought down into extension and adducted to provide visualization of the proximal femur for broaching.  The femur was then sequentially broached up to an appropriate size which provided for good fill and stability to the femoral broach.  A trial neck and head were placed on the femoral broach and the leg was brought up for reduction.  The hip was reduced and manual check of stability was performed.  The hip was found to be stable in flexion internal rotation and extension external rotation.  Leg lengths were confirmed on fluoroscopy.   The hip was then dislocated the trial neck and head were removed.  The leg was then brought down into extension and adduction in the proximal femur was reexposed.  The broach trial was removed and the femur was irrigated with normal saline prior to the real femoral stem being implanted.  After the femoral stem was seated and shown to have good fit and fill the appropriate head was impacted the leg was brought up and reduced.  There was good range of motion with stability in flexion internal rotation and extension external rotation on testing.  Leg lengths were found to be appropriate on fluoroscopic evaluation at this time.  The hip was then irrigated with betdine based surgiphor solution and then saline solution.  The capsulotomy was repaired with Ethibond sutures.  A  pericapsular and peritrochanteric cocktail with Exparel and bupivacaine was then injected as well as the subcutaneous tissues. The fascia was closed with a #2 barbed running suture.  The deep tissues were closed with Vicryl sutures the subcutaneous tissues were closed with interrupted Vicryl sutures and a running barbed 3-0 suture.  The skin was then reinforced with Dermabond and a sterile dressing was placed.   The patient was awoken from anesthesia transferred off of the operating room table onto a hospital bed where examination of leg lengths found the leg lengths to be equal with a good distal pulse.  The patient was then transferred to the PACU in stable condition.

## 2023-06-04 NOTE — Transfer of Care (Signed)
Immediate Anesthesia Transfer of Care Note  Patient: Lynn Potter  Procedure(s) Performed: TOTAL HIP ARTHROPLASTY ANTERIOR APPROACH (Left: Hip)  Patient Location: PACU  Anesthesia Type:General  Level of Consciousness: sedated  Airway & Oxygen Therapy: Patient Spontanous Breathing and Patient connected to face mask oxygen  Post-op Assessment: Report given to RN and Post -op Vital signs reviewed and stable  Post vital signs: Reviewed and stable  Last Vitals:  Vitals Value Taken Time  BP 143/71 06/04/23 1016  Temp    Pulse 83 06/04/23 1020  Resp 17 06/04/23 1020  SpO2 97 % 06/04/23 1020  Vitals shown include unfiled device data.  Last Pain:  Vitals:   06/04/23 0631  TempSrc: Temporal  PainSc: 10-Worst pain ever         Complications: No notable events documented.

## 2023-06-04 NOTE — Anesthesia Procedure Notes (Signed)
Procedure Name: Intubation Date/Time: 06/04/2023 7:49 AM  Performed by: Elisabeth Pigeon, CRNAPre-anesthesia Checklist: Patient identified, Patient being monitored, Timeout performed, Emergency Drugs available and Suction available Patient Re-evaluated:Patient Re-evaluated prior to induction Oxygen Delivery Method: Circle system utilized Preoxygenation: Pre-oxygenation with 100% oxygen Induction Type: IV induction Ventilation: Mask ventilation without difficulty Laryngoscope Size: Mac, 3 and McGraph Grade View: Grade I Tube type: Oral Tube size: 6.5 mm Number of attempts: 1 Airway Equipment and Method: Stylet Placement Confirmation: ETT inserted through vocal cords under direct vision, positive ETCO2 and breath sounds checked- equal and bilateral Secured at: 21 cm Tube secured with: Tape Dental Injury: Teeth and Oropharynx as per pre-operative assessment

## 2023-06-04 NOTE — H&P (Signed)
History of Present Illness: Lynn Potter is an 77 y.o. female presents for follow-up evaluation of her left hip prior to planned left hip replacement. Patient has successfully been off of cigarettes now for 5 weeks. She continues to have severe left groin pain which radiates down her anterior left thigh with walking. This sharp groin pain is achy and severe up to a 10 out of 10 at its worst. She uses a rolling walker for ambulation due to severe limitations in her activities of daily living secondary to her left hip. She is gone through conservative treatments with no improvement in function of her hip. At this time she would like to move forward with left hip replacement. Denies.  Patient is a diabetic well-controlled with hemoglobin A1c of 6.5, BMI of 31.4, and she is now completely off of cigarettes.  Past Medical History: Past Medical History:  Diagnosis Date  Arthritis  Asthma (HHS-HCC)  Clotting disorder (CMS/HHS-HCC)  Depression  Diabetes mellitus type 2, uncomplicated (CMS/HHS-HCC)  Diverticulosis 12/25/2014  GERD (gastroesophageal reflux disease)  Glaucoma  HH (hiatus hernia) 12/25/2014  History of chickenpox  History of kidney stones  Hypertension  Internal hemorrhoids 12/25/2014  Irregular heart beat  Migraines  Neuropathy  Obesity  Sleep apnea  Tobacco abuse  Tubular adenoma of colon 12/25/2014   Past Surgical History: Past Surgical History:  Procedure Laterality Date  REPLACEMENT TOTAL KNEE Bilateral 1999  BACK SURGERY 2002  CATARACT EXTRACTION 2006  BACK SURGERY LUMBAR FUSION 2006  COLONOSCOPY 02/25/2011  Redundant Colon; Unable to Complete  EGD 07/14/2013  Neuroendocrine Tumor (EGD 07/18/1999 w/no repeat)  EGD 12/13/2013  Nodule in Duodenum, 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  EGD 05/04/2014  Duodenitis; Negative for NET, EUS to be scheduled by [facility] 07/2014  COLONOSCOPY 12/25/2014  Adenomatous Polyps: CBF 12/2017  EGD 12/25/2014  PH Duodenum  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  EGD 02/16/2017  Normal: No repeat per RTE  EGD 08/03/2019  GERD/No Repeat/TKT  COLONOSCOPY 08/03/2019  Diverticulosis/PHx CP/Repeat 13yrs/TKT  ABDOMINAL HYSTERECTOMY W/ PARTIAL VAGINACTOMY  BREAST EXCISIONAL BIOPSY  CARPAL TUNNEL RELEASE  CARPAL TUNNEL SURGERY  CHOLECYSTECTOMY  HYSTERECTOMY  JOINT REPLACEMENT  OOPHORECTOMY  REPAIR ROTATOR CUFF TEAR CHRONIC OPEN Bilateral   Past Family History: Family History  Problem Relation Age of Onset  Diabetes type II Mother  Heart disease Mother  Diabetes Mother  Diabetes type II Father  Myocardial Infarction (Heart attack) Father  Clotting disorder Father  Kidney disease Sister  Diabetes Sister   Medications: Current Outpatient Medications  Medication Sig Dispense Refill  ACCU-CHEK SOFTCLIX LANCETS lancets TWICE A DAY  ACCU-CHEK AVIVA PLUS METER Misc  ACCU-CHEK AVIVA PLUS TEST STRP test strip TWICE A DAY 5  acetaminophen (TYLENOL EXTRA STRENGTH ORAL) Take by mouth as needed  ascorbic acid, vitamin C, (VITAMIN C) 1000 MG tablet Take 1,000 mg by mouth once daily  aspirin 81 MG EC tablet Take 81 mg by mouth once daily  atorvastatin (LIPITOR) 40 MG tablet Take 1 tablet (40 mg total) by mouth once daily  BD ULTRA-FINE ORIG PEN NEEDLE 29 gauge x 1/2" needle USE 3 TIMES DAILY. 4  cholecalciferol, vitamin D3, (VITAMIN D3) 125 mcg (5,000 unit) tablet Take 1 tablet by mouth once daily.  DULoxetine (CYMBALTA) 30 MG DR capsule Take 30 mg by mouth once daily  FLOVENT HFA 110 mcg/actuation inhaler Inhale 2 inhalations into the lungs 2 (two) times daily 12 g 2  gabapentin (NEURONTIN) 300 MG capsule Take 1 capsule p.o. 3 times  daily 90 capsule 7  HUMALOG KWIKPEN INSULIN pen injector (concentration 100 units/mL) Inject 3 Units subcutaneously 3 (three) times a day  hydrOXYzine pamoate (VISTARIL) 25 MG capsule TAKE 1 CAPSULE BY MOUTH EVERY 12 HOURS AS NEEDED 0  ipratropium-albuteroL (COMBIVENT RESPIMAT) 20-100  mcg/actuation inhaler Inhale 2 inhalations into the lungs 4 (four) times daily as needed for Wheezing for up to 90 days 4 g 3  JARDIANCE 25 mg Tab tablet Take 1 tablet by mouth once daily 2  KLOR-CON M10 10 mEq ER tablet Take 10 mEq by mouth once daily  latanoprost (XALATAN) 0.005 % ophthalmic solution 1 drop nightly.  LEVEMIR FLEXTOUCH U-100 INSULN pen injector (concentration 100 units/mL) INJECT 25 UNITS SUBCUTANEOUS EVERY DAY. 5  metFORMIN (GLUCOPHAGE) 500 MG tablet Take 500 mg by mouth 2 (two) times daily 2  metoprolol succinate (TOPROL-XL) 25 MG XL tablet Take 25 mg by mouth once daily  montelukast (SINGULAIR) 10 mg tablet Take 1 tablet (10 mg total) by mouth at bedtime 30 tablet 11  olmesartan-hydroCHLOROthiazide (BENICAR HCT) 40-25 mg tablet Take 1 tablet by mouth once daily TAKE 1 TABLET BY MOUTH EVERY DAY  omega 3-dha-epa-fish oil (FISH OIL) 100-160-1,000 mg Cap Take 1 capsule by mouth once daily  pantoprazole (PROTONIX) 20 MG DR tablet TAKE 1 TABLET BY MOUTH EVERY DAY 90 tablet 3  rosuvastatin (CRESTOR) 40 MG tablet  traZODone (DESYREL) 50 MG tablet Take 1 tablet by mouth once daily  UBRELVY 100 mg Tab   No current facility-administered medications for this visit.   Allergies: No Known Allergies   Visit Vitals: There were no vitals filed for this visit.   Review of Systems:  A comprehensive 14 point ROS was performed, reviewed, and the pertinent orthopaedic findings are documented in the HPI.  Physical Exam: Body mass index is 31.43 kg/m. General/Constitutional: No apparent distress: well-nourished and well developed. Pulmonary exam: Lungs clear to auscultation bilaterally no wheezing rales or rhonchi Cardiac exam: Regular rate and rhythm no obvious murmurs rubs or gallops. Lymphatic: No palpable adenopathy. Vascular: No edema, swelling or tenderness, except as noted in detailed exam. Integumentary: No impressive skin lesions present, except as noted in detailed  exam. Neuro/Psych: Normal mood and affect, oriented to person, place and time. Musculoskeletal: Normal, except as noted in detailed exam and in HPI.  Left hip exam  SKIN: intact SWELLING: none WARMTH: no warmth TENDERNESS: none, Stinchfield Positive ROM: 10 degrees internal rotation and 20 degrees external rotation and pain with internal rotation,; Hip Flexion 90, hip unable to be fully extended and obligate 20 degrees of flexion secondary to pain and stiffness. STRENGTH: normal GAIT: antalgic and stiff-legged, with a rolling walker STABILITY: stable to testing CREPITUS: yes LEG LENGTH DISCREPANCY: Difficult to assess secondary to obligate left hip flexion, but appears about 1 cm short on the left NEUROLOGICAL EXAM: normal VASCULAR EXAM: normal LUMBAR SPINE: tenderness: yes some mild paraspinal tenderness straight leg raising sign: no motor exam: normal  The contralateral hip was examined for comparison and it showed: TENDERNESS: none ROM: normal and full STRENGTH: normal STABILITY: stable to testing  Hip Imaging :  I have reviewed AP pelvis and lateral hip X-rays (2 views) taken at the last office visit of the left hip which reveal moderate/severe degenerative changes with joint space narrowing and osteophyte formation, subchondral cysts and sclerosis of the left hip. There is a slightly mottled appearance to the femoral head coinciding with the cystic changes as well as Sclerosis in the femoral head and acetabulum.  The right hip is noted to have moderate degenerative changes with some similar appearance to the bone. No aggressive appearing lesions noted. No fractures or dislocations noted around the hips or pelvis. There are noted vascular clips and vascular coils within the pelvic brim.   MRI of the left femur without contrast performed on 03/27/2023 images and report reviewed by myself. There is extensive full-thickness cartilage loss of the entire femoral head and acetabulum with  subchondral cystic changes and marrow edema. There is partial-thickness cartilage loss in the right hip. No other acute osseous injuries or bony lesions concerning. Severe left hip osteoarthritis. Agree with radiologist interpretation  Lateral sit/stand x-rays of the pelvis and lumbosacral spine ordered and taken today in clinic images reviewed by myself. There is lumbosacral hardware in place with fusion to the sacrum. Despite fusion there is still pelvic motion from sitting to standing of approximately 15 degrees. No fractures noted.  Assessment:  Encounter Diagnosis  Name Primary?  Osteoarthritis of left hip, unspecified osteoarthritis type Yes   Plan: Terryann is a 77 year old female with left hip bone on bone arthritis. Based upon the patient's continued symptoms and failure to respond to conservative treatment, I have recommended a left total hip replacement for this patient. A long discussion took place with the patient describing what a total joint replacement is and what the procedure would entail. A hip model, similar to the implants that will be used during the operation, was utilized to demonstrate the implants. Choices of implant manufactures were discussed and reviewed. The ability to secure the implant utilizing cement or cementless (press fit) fixation was discussed. Anterior and posterior exposures were discussed. For this patient an appropriate approach will be anterior.   The hospitalization and post-operative care and rehabilitation were also discussed. The use of perioperative antibiotics and DVT prophylaxis were discussed. The risk, benefits and alternatives to a surgical intervention were discussed at length with the patient. The patient was also advised of risks related to the medical comorbidities and elevated body mass index (BMI). A lengthy discussion took place to review the most common complications including but not limited to: deep vein thrombosis, pulmonary embolus, heart  attack, stroke, infection, wound breakdown, heterotopic ossification, dislocation, numbness, leg length in-equality, intraoperative fracture, damage to nerves, tendon,muscles, arteries or other blood vessels, death and other possible complications from anesthesia. The patient was told that we will take steps to minimize these risks by using sterile technique, antibiotics and DVT prophylaxis when appropriate and follow the patient postoperatively in the office setting to monitor progress. The possibility of recurrent pain, no improvement in pain and actual worsening of pain were also discussed with the patient. The risk of dislocation following total hip replacement was discussed and potential precautions to prevent dislocation were reviewed.   The discharge plan of care focused on the patient going home following surgery. The patient was encouraged to make the necessary arrangements to have someone stay with them when they are discharged home.   The benefits of surgery were discussed with the patient including the potential for improving the patient's current clinical condition through operative intervention. Alternatives to surgical intervention including continued conservative management were also discussed in detail. All questions were answered to the satisfaction of the patient. The patient participated and agreed to the plan of care as well as the use of the recommended implants for their total hip replacement surgery. An information packet was given to the patient to review prior to surgery.   The patient has  received medical clearance for surgery. She has now been off cigarettes more than 6 weeks prior to planned surgery. Nicotine test at presurgical testing was negative. All questions answered and the patient agrees with above plan to prepare for a left anterior total replacement.  Portions of this record have been created using Scientist, clinical (histocompatibility and immunogenetics). Dictation errors have been sought, but may  not have been identified and corrected.  Reinaldo Berber MD

## 2023-06-04 NOTE — Interval H&P Note (Signed)
Discussed with patient and family preoperatively that she will be full code while here for her elective hip replacement.

## 2023-06-04 NOTE — Anesthesia Preprocedure Evaluation (Signed)
Anesthesia Evaluation  Patient identified by MRN, date of birth, ID band Patient awake    Reviewed: Allergy & Precautions, NPO status , Patient's Chart, lab work & pertinent test results  History of Anesthesia Complications Negative for: history of anesthetic complications  Airway Mallampati: III  TM Distance: >3 FB Neck ROM: full    Dental  (+) Missing   Pulmonary asthma , sleep apnea , COPD, Current Smoker   Pulmonary exam normal        Cardiovascular Exercise Tolerance: Good hypertension, + CAD  + dysrhythmias Atrial Fibrillation      Neuro/Psych  Headaches PSYCHIATRIC DISORDERS       Neuromuscular disease    GI/Hepatic Neg liver ROS, hiatal hernia,GERD  Controlled,,  Endo/Other  negative endocrine ROSdiabetes, Type 2    Renal/GU Renal disease     Musculoskeletal   Abdominal   Peds  Hematology negative hematology ROS (+)   Anesthesia Other Findings Past Medical History: No date: AF (atrial fibrillation) (HCC)     Comment:  a.) CHA2DS2-VASc: 84 (age x2, sex, HTN, vascular disease               history, T2DM); b.) rate/rhythm maintained intrinsically               without pharmacological intervention; no chronic oral               anticoagulation No date: Aortic atherosclerosis (HCC) No date: Arthritis No date: Asthma No date: B12 deficiency No date: Bilateral carpal tunnel syndrome No date: CAD (coronary artery disease) No date: Cerebral microvascular disease No date: Chicken pox No date: Clotting disorder (HCC) No date: COPD (chronic obstructive pulmonary disease) (HCC) No date: DDD (degenerative disc disease), lumbosacral     Comment:  a.) s/p L4-S1 fusion in 2006 No date: Depression No date: Diabetic peripheral neuropathy (HCC) 06/06/2022: Diastolic dysfunction     Comment:  a.) TTE 06/06/2022: EF >55%, no RWMAs, mild LVH, triv               MR/TR, mild AR, RVSP 20.5, G1DD No date:  Diverticulosis No date: DOE (dyspnea on exertion) No date: GERD (gastroesophageal reflux disease) No date: Glaucoma No date: Hepatic steatosis No date: HH (hiatus hernia) No date: History of kidney stones No date: HLD (hyperlipidemia) No date: Hypertension No date: IDA (iron deficiency anemia) No date: Insomnia     Comment:  a.) uses trazodone PRN 12/25/2014: Internal hemorrhoids No date: LAFB (left anterior fascicular block) No date: Long term current use of aspirin No date: Lumbar spinal stenosis No date: Migraines 10/18/2020: Myofibroblastoma of right breast     Comment:  a.) Bx 10/18/2020 - spindle cell neoplasm; b.) s/p               partial mastectomy 12/06/2020 --> final pathology               revealed estrogen receptor (+) myofibroblastoma No date: Neuroendocrine tumor status post surgical treatment     Comment:  a.) stage I; well differentiated --> s/p bowel resection No date: Obesity No date: OSA (obstructive sleep apnea)     Comment:  a.) non-compliant with prescribed nocturnal PAP therapy 06/18/2015: Personal history of tobacco use, presenting hazards to  health No date: RLS (restless legs syndrome)     Comment:  a.) on ropinirole No date: Status post carpal tunnel release No date: T2DM (type 2 diabetes mellitus) (HCC) No date: Tubular adenoma of colon  Past Surgical History: 1982: ABDOMINAL  HYSTERECTOMY; N/A No date: APPENDECTOMY 2002: BACK SURGERY No date: CARPAL TUNNEL RELEASE 2006: CATARACT EXTRACTION No date: CHOLECYSTECTOMY No date: COLONOSCOPY WITH ESOPHAGOGASTRODUODENOSCOPY (EGD) 08/03/2019: COLONOSCOPY WITH PROPOFOL; N/A     Comment:  Procedure: COLONOSCOPY WITH PROPOFOL;  Surgeon: Toledo,               Boykin Nearing, MD;  Location: [facility] ENDOSCOPY;  Service:               Gastroenterology;  Laterality: N/A; 02/16/2017: ESOPHAGOGASTRODUODENOSCOPY (EGD) WITH PROPOFOL; N/A     Comment:  Procedure: ESOPHAGOGASTRODUODENOSCOPY (EGD) WITH                PROPOFOL;  Surgeon: Scot Jun, MD;  Location:               [facility] ENDOSCOPY;  Service: Endoscopy;  Laterality: N/A; 08/03/2019: ESOPHAGOGASTRODUODENOSCOPY (EGD) WITH PROPOFOL; N/A     Comment:  Procedure: ESOPHAGOGASTRODUODENOSCOPY (EGD) WITH               PROPOFOL;  Surgeon: Toledo, Boykin Nearing, MD;  Location:               [facility] ENDOSCOPY;  Service: Gastroenterology;  Laterality:               N/A; 2006: LUMBAR FUSION 12/06/2020: MASTECTOMY PARTIAL / LUMPECTOMY; Right No date: REPAIR ROTATOR CUFF TEAR 1999: TOTAL KNEE ARTHROPLASTY; Bilateral 01/03/2016: UPPER ESOPHAGEAL ENDOSCOPIC ULTRASOUND (EUS); N/A     Comment:  Procedure: UPPER ESOPHAGEAL ENDOSCOPIC ULTRASOUND (EUS);              Surgeon: Bearl Mulberry, MD;  Location: [facility]               ENDOSCOPY;  Service: Gastroenterology;  Laterality: N/A;     Reproductive/Obstetrics negative OB ROS                             Anesthesia Physical Anesthesia Plan  ASA: 3  Anesthesia Plan: Spinal   Post-op Pain Management:    Induction:   PONV Risk Score and Plan:   Airway Management Planned: Natural Airway and Nasal Cannula  Additional Equipment:   Intra-op Plan:   Post-operative Plan:   Informed Consent: I have reviewed the patients History and Physical, chart, labs and discussed the procedure including the risks, benefits and alternatives for the proposed anesthesia with the patient or authorized representative who has indicated his/her understanding and acceptance.     Dental Advisory Given  Plan Discussed with: Anesthesiologist, CRNA and Surgeon  Anesthesia Plan Comments: (Patient reports no bleeding problems and no anticoagulant use.  Plan for spinal with backup GA  Patient consented for risks of anesthesia including but not limited to:  - adverse reactions to medications - damage to eyes, teeth, lips or other oral mucosa - nerve damage due to positioning  - risk of bleeding,  infection and or nerve damage from spinal that could lead to paralysis - risk of headache or failed spinal - damage to teeth, lips or other oral mucosa - sore throat or hoarseness - damage to heart, brain, nerves, lungs, other parts of body or loss of life  Patient voiced understanding.)       Anesthesia Quick Evaluation

## 2023-06-04 NOTE — Evaluation (Signed)
Physical Therapy Evaluation Patient Details Name: Lynn Potter MRN: [id number] DOB: 1946-07-24 Today's Date: 06/04/2023  History of Present Illness  Patient admitted for L THA, anterior, WBAT  Clinical Impression  Patient received in bed, had just finished lunch. Family at bedside. She is lethargic, but agreeable to PT assessment. Patient requires min A for L LE movement for supine to sit. She is able to scoot to edge of bed with contact guard A. Patient is unable to attempt standing at this time due to fatigue and nausea with sitting edge of bed. She attempted scooting while seated but was unsuccessful. Ultimately required +2 max A for sit to supine and repositioning in bed. Patient will continue to benefit from skilled PT to improve functional independence, and safety with mobility.           If plan is discharge home, recommend the following: Two people to help with walking and/or transfers;Two people to help with bathing/dressing/bathroom   Can travel by private vehicle   No    Equipment Recommendations Other (comment) (TBD)  Recommendations for Other Services       Functional Status Assessment Patient has had a recent decline in their functional status and demonstrates the ability to make significant improvements in function in a reasonable and predictable amount of time.     Precautions / Restrictions Precautions Precautions: Fall;Anterior Hip Precaution Booklet Issued: No Restrictions Weight Bearing Restrictions: Yes LLE Weight Bearing: Weight bearing as tolerated      Mobility  Bed Mobility Overal bed mobility: Needs Assistance Bed Mobility: Supine to Sit, Sit to Supine     Supine to sit: Min assist, Used rails, HOB elevated Sit to supine: Max assist, +2 for physical assistance, Used rails   General bed mobility comments: patient is able to sit edge of bed with min A for L LE movement. Required max +2 to return to supine due to weakness, lethargy and pain     Transfers                   General transfer comment: unable    Ambulation/Gait               General Gait Details: unable  Stairs            Wheelchair Mobility     Tilt Bed    Modified Rankin (Stroke Patients Only)       Balance Overall balance assessment: Needs assistance Sitting-balance support: Feet supported Sitting balance-Leahy Scale: Fair Sitting balance - Comments: lethargy at edge of bed requiring supervision                                     Pertinent Vitals/Pain Pain Assessment Pain Assessment: Faces Faces Pain Scale: Hurts even more Pain Location: L hip Pain Descriptors / Indicators: Grimacing, Guarding, Sore Pain Intervention(s): Monitored during session, Repositioned, Premedicated before session, Ice applied    Home Living Family/patient expects to be discharged to:: Skilled nursing facility Living Arrangements: Alone                 Additional Comments: Lives in apartment with elevator access.    Prior Function Prior Level of Function : Independent/Modified Independent             Mobility Comments: uses walker at baseline ADLs Comments: Mod I     Extremity/Trunk Assessment   Upper Extremity Assessment Upper Extremity Assessment:  Defer to OT evaluation    Lower Extremity Assessment Lower Extremity Assessment: LLE deficits/detail;Generalized weakness LLE: Unable to fully assess due to pain LLE Coordination: decreased gross motor    Cervical / Trunk Assessment Cervical / Trunk Assessment: Normal  Communication   Communication Communication: No apparent difficulties Cueing Techniques: Verbal cues;Gestural cues;Tactile cues  Cognition Arousal: Lethargic Behavior During Therapy: WFL for tasks assessed/performed, Flat affect Overall Cognitive Status: Difficult to assess                                          General Comments      Exercises      Assessment/Plan    PT Assessment Patient needs continued PT services  PT Problem List Decreased strength;Decreased range of motion;Decreased activity tolerance;Decreased balance;Decreased mobility;Decreased knowledge of precautions;Decreased knowledge of use of DME;Decreased cognition;Pain;Obesity       PT Treatment Interventions DME instruction;Gait training;Functional mobility training;Therapeutic activities;Therapeutic exercise;Manual techniques;Wheelchair mobility training;Patient/family education;Neuromuscular re-education;Balance training    PT Goals (Current goals can be found in the Care Plan section)  Acute Rehab PT Goals Patient Stated Goal: family wondering if patient can go to rehab PT Goal Formulation: With family Time For Goal Achievement: 06/19/23 Potential to Achieve Goals: Good    Frequency Min 1X/week     Co-evaluation               AM-PAC PT "6 Clicks" Mobility  Outcome Measure Help needed turning from your back to your side while in a flat bed without using bedrails?: A Lot Help needed moving from lying on your back to sitting on the side of a flat bed without using bedrails?: A Lot Help needed moving to and from a bed to a chair (including a wheelchair)?: Total Help needed standing up from a chair using your arms (e.g., wheelchair or bedside chair)?: Total Help needed to walk in hospital room?: Total Help needed climbing 3-5 steps with a railing? : Total 6 Click Score: 8    End of Session Equipment Utilized During Treatment: Oxygen Activity Tolerance: Patient limited by fatigue;Patient limited by lethargy Patient left: in bed;with call bell/phone within reach;with nursing/sitter in room;with family/visitor present Nurse Communication: Mobility status PT Visit Diagnosis: Other abnormalities of gait and mobility (R26.89);Muscle weakness (generalized) (M62.81);Pain Pain - Right/Left: Left Pain - part of body: Hip    Time: 4782-9562 PT Time  Calculation (min) (ACUTE ONLY): 17 min   Charges:   PT Evaluation $PT Eval Moderate Complexity: 1 Mod PT Treatments $Therapeutic Activity: 8-22 mins PT General Charges $$ ACUTE PT VISIT: 1 Visit         Taiylor Virden, PT, GCS 06/04/23,3:43 PM

## 2023-06-04 NOTE — Interval H&P Note (Signed)
Patient history and physical updated. Consent reviewed including risks, benefits, and alternatives to surgery. Patient agrees with above plan to proceed with left anterior total hip arthroplasty  

## 2023-06-05 ENCOUNTER — Encounter: Payer: Self-pay | Admitting: Orthopedic Surgery

## 2023-06-05 DIAGNOSIS — M1612 Unilateral primary osteoarthritis, left hip: Secondary | ICD-10-CM | POA: Diagnosis not present

## 2023-06-05 LAB — BASIC METABOLIC PANEL
Anion gap: 14 (ref 5–15)
BUN: 8 mg/dL (ref 8–23)
CO2: 25 mmol/L (ref 22–32)
Calcium: 8.9 mg/dL (ref 8.9–10.3)
Chloride: 95 mmol/L — ABNORMAL LOW (ref 98–111)
Creatinine, Ser: 0.78 mg/dL (ref 0.44–1.00)
GFR, Estimated: >60 mL/min (ref >60)
Glucose, Bld: 133 mg/dL — ABNORMAL HIGH (ref 70–99)
Potassium: 3.6 mmol/L (ref 3.5–5.1)
Sodium: 134 mmol/L — ABNORMAL LOW (ref 135–145)

## 2023-06-05 LAB — CBC
HCT: 29.7 % — ABNORMAL LOW (ref 36.0–46.0)
Hemoglobin: 9.7 g/dL — ABNORMAL LOW (ref 12.0–15.0)
MCH: 26.8 pg (ref 26.0–34.0)
MCHC: 32.7 g/dL (ref 30.0–36.0)
MCV: 82 fL (ref 80.0–100.0)
Platelets: 239 10*3/uL (ref 150–400)
RBC: 3.62 MIL/uL — ABNORMAL LOW (ref 3.87–5.11)
RDW: 13.3 % (ref 11.5–15.5)
WBC: 11.6 10*3/uL — ABNORMAL HIGH (ref 4.0–10.5)
nRBC: 0 % (ref 0.0–0.2)

## 2023-06-05 LAB — GLUCOSE, CAPILLARY
Glucose-Capillary: 110 mg/dL — ABNORMAL HIGH (ref 70–99)
Glucose-Capillary: 124 mg/dL — ABNORMAL HIGH (ref 70–99)
Glucose-Capillary: 120 mg/dL — ABNORMAL HIGH (ref 70–99)
Glucose-Capillary: 138 mg/dL — ABNORMAL HIGH (ref 70–99)

## 2023-06-05 MED ORDER — KETOROLAC TROMETHAMINE 15 MG/ML IJ SOLN
INTRAMUSCULAR | Status: AC
  Filled 2023-06-05: qty 1

## 2023-06-05 MED ORDER — GABAPENTIN 300 MG PO CAPS
300.0000 mg | ORAL_CAPSULE | Freq: Once | ORAL | Status: AC
  Administered 2023-06-06: 300 mg via ORAL
  Filled 2023-06-05: qty 1

## 2023-06-05 MED ORDER — ENOXAPARIN SODIUM 40 MG/0.4ML IJ SOSY
PREFILLED_SYRINGE | INTRAMUSCULAR | Status: AC
  Filled 2023-06-05: qty 0.4

## 2023-06-05 MED ORDER — TRAMADOL HCL 50 MG PO TABS
ORAL_TABLET | ORAL | Status: AC
  Filled 2023-06-05: qty 1

## 2023-06-05 MED ORDER — ACETAMINOPHEN 500 MG PO TABS
ORAL_TABLET | ORAL | Status: AC
  Filled 2023-06-05: qty 2

## 2023-06-05 MED ORDER — ATORVASTATIN CALCIUM 20 MG PO TABS
ORAL_TABLET | ORAL | Status: AC
  Filled 2023-06-05: qty 2

## 2023-06-05 MED ORDER — DOCUSATE SODIUM 100 MG PO CAPS
ORAL_CAPSULE | ORAL | Status: AC
  Filled 2023-06-05: qty 1

## 2023-06-05 MED ORDER — INSULIN ASPART 100 UNIT/ML IJ SOLN
INTRAMUSCULAR | Status: AC
  Filled 2023-06-05: qty 1

## 2023-06-05 MED ORDER — ROSUVASTATIN CALCIUM 10 MG PO TABS
40.0000 mg | ORAL_TABLET | Freq: Every day | ORAL | Status: DC
  Administered 2023-06-06 – 2023-06-08 (×3): 40 mg via ORAL
  Filled 2023-06-05 (×4): qty 4

## 2023-06-05 MED ORDER — GABAPENTIN 300 MG PO CAPS
ORAL_CAPSULE | ORAL | Status: AC
  Filled 2023-06-05: qty 1

## 2023-06-05 MED ORDER — PANTOPRAZOLE SODIUM 40 MG PO TBEC
DELAYED_RELEASE_TABLET | ORAL | Status: AC
  Filled 2023-06-05: qty 1

## 2023-06-05 NOTE — Progress Notes (Signed)
Pts BP 114/69, scheduled BP meds held. Chris PA notified and made aware, no orders received.

## 2023-06-05 NOTE — Progress Notes (Addendum)
Subjective: 1 Day Post-Op Procedure(s) (LRB): TOTAL HIP ARTHROPLASTY ANTERIOR APPROACH (Left) Patient reports pain as mild.   Patient is well, and has had no acute complaints or problems Denies any CP, SOB, ABD pain. We will continue therapy today.  Plan is to go Skilled nursing facility after hospital stay.  Objective: Vital signs in last 24 hours: Temp:  [96.7 F (35.9 C)-98 F (36.7 C)] 98 F (36.7 C) (08/30 0748) Pulse Rate:  [60-92] 65 (08/30 0748) Resp:  [7-18] 18 (08/30 0748) BP: (105-145)/(63-95) 114/69 (08/30 0748) SpO2:  [88 %-100 %] 99 % (08/30 0748)  Intake/Output from previous day: 08/29 0701 - 08/30 0700 In: 1790.1 [P.O.:240; I.V.:1050.1; IV Piggyback:500] Out: 400 [Urine:200; Blood:200] Intake/Output this shift: No intake/output data recorded.  Recent Labs    06/05/23 0547  HGB 9.7*   Recent Labs    06/05/23 0547  WBC 11.6*  RBC 3.62*  HCT 29.7*  PLT 239   Recent Labs    06/05/23 0547  NA 134*  K 3.6  CL 95*  CO2 25  BUN 8  CREATININE 0.78  GLUCOSE 133*  CALCIUM 8.9   No results for input(s): "LABPT", "INR" in the last 72 hours.  EXAM General - Patient is Alert, Appropriate, and Oriented Extremity - Neurovascular intact Sensation intact distally Intact pulses distally Dorsiflexion/Plantar flexion intact Dressing - dressing C/D/I and no drainage Motor Function - intact, moving foot and toes well on exam.   Past Medical History:  Diagnosis Date   AF (atrial fibrillation) (HCC)    a.) CHA2DS2-VASc: 52 (age x2, sex, HTN, vascular disease history, T2DM); b.) rate/rhythm maintained intrinsically without pharmacological intervention; no chronic oral anticoagulation   Aortic atherosclerosis (HCC)    Arthritis    Asthma    B12 deficiency    Bilateral carpal tunnel syndrome    CAD (coronary artery disease)    Cerebral microvascular disease    Chicken pox    Clotting disorder (HCC)    COPD (chronic obstructive pulmonary disease) (HCC)     DDD (degenerative disc disease), lumbosacral    a.) s/p L4-S1 fusion in 2006   Depression    Diabetic peripheral neuropathy (HCC)    Diastolic dysfunction 06/06/2022   a.) TTE 06/06/2022: EF >55%, no RWMAs, mild LVH, triv MR/TR, mild AR, RVSP 20.5, G1DD   Diverticulosis    DOE (dyspnea on exertion)    GERD (gastroesophageal reflux disease)    Glaucoma    Hepatic steatosis    HH (hiatus hernia)    History of kidney stones    HLD (hyperlipidemia)    Hypertension    IDA (iron deficiency anemia)    Insomnia    a.) uses trazodone PRN   Internal hemorrhoids 12/25/2014   LAFB (left anterior fascicular block)    Long term current use of aspirin    Lumbar spinal stenosis    Migraines    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   a.) Bx 10/18/2020 - spindle cell neoplasm; b.) s/p partial mastectomy 12/06/2020 --> final pathology revealed estrogen receptor (+) myofibroblastoma   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; well differentiated --> s/p bowel resection   Obesity    OSA (obstructive sleep apnea)    a.) non-compliant with prescribed nocturnal PAP therapy   Personal history of tobacco use, presenting hazards to health 06/18/2015   RLS (restless legs syndrome)    a.) on ropinirole   Status post carpal tunnel release    T2DM (  type 2 diabetes mellitus) (HCC)    Tubular adenoma of colon     Assessment/Plan:   1 Day Post-Op Procedure(s) (LRB): TOTAL HIP ARTHROPLASTY ANTERIOR APPROACH (Left) Principal Problem:   Osteoarthritis of left hip  Estimated body mass index is 36.09 kg/m as calculated from the following:   Height as of 05/22/23: 5\' 1"  (1.549 m).   Weight as of 05/22/23: 86.6 kg. Advance diet Up with therapy Pain well-controlled Labs and vital signs are stable Care management to assist with discharge to skilled nursing facility.  Patient lives at home alone, slow progress with therapy.   DVT Prophylaxis - Lovenox, TED hose, and  SCDs Weight-Bearing as tolerated to left leg   T. Cranston Neighbor, PA-C [facility] 06/05/2023, 8:22 AM  Patient seen and examined, agree with above plan.  The patient is doing well status post left anterior total hip arthroplasty, no concerns at this time.  Pain is controlled.  Discussed DVT prophylaxis, pain medication use, and safe transition to home versus rehab.  All questions answered the patient agrees with above plan will go home after clears PT.   Reinaldo Berber MD

## 2023-06-05 NOTE — Discharge Instructions (Signed)
Instructions after Anterior Total Hip Replacement        Dr. Serita Butcher., M.D.      Dept. of [facility]  [address] [place], [place]  [address]  Phone: [phone]   Fax: [phone]    DIET: Drink plenty of non-alcoholic fluids. Resume your normal diet. Include foods high in fiber.  ACTIVITY:  You may use crutches or a walker with weight-bearing as tolerated, unless instructed otherwise. You may be weaned off of the walker or crutches by your Physical Therapist.  Continue doing gentle exercises. Exercising will reduce the pain and swelling, increase motion, and prevent muscle weakness.   Please continue to use the TED compression stockings for 2 weeks. You may remove the stockings at night, but should reapply them in the morning. Do not drive or operate any equipment until instructed.  WOUND CARE:  Continue to use ice packs periodically to reduce pain and swelling. You may shower with honeycomb dressing 3 days after your surgery. Do not submerge incision site under water. Remove honeycomb dressing 7 days after surgery and allow dermabond to fall off on its own.   MEDICATIONS: You may resume your regular medications. Please take the pain medication as prescribed on the medication list. Do not take pain medication on an empty stomach. You have been given a prescription for a blood thinner to prevent blood clots. Please take the medication as instructed. (NOTE: After completing a 2 week course of Lovenox, take one Enteric-coated 81 mg aspirin twice a day for 3 additional weeks.) Pain medications and iron supplements can cause constipation. Use a stool softener (Senokot or Colace) on a daily basis and a laxative (dulcolax or miralax) as needed. Do not drive or drink alcoholic beverages when taking pain medications.  POSTOPERATIVE CONSTIPATION PROTOCOL Constipation - defined medically as fewer than three stools per week and  severe constipation as less than one stool per week.  One of the most common issues patients have following surgery is constipation.  Even if you have a regular bowel pattern at home, your normal regimen is likely to be disrupted due to multiple reasons following surgery.  Combination of anesthesia, postoperative narcotics, change in appetite and fluid intake all can affect your bowels.  In order to avoid complications following surgery, here are some recommendations in order to help you during your recovery period.  Colace (docusate) - Pick up an over-the-counter form of Colace or another stool softener and take twice a day as long as you are requiring postoperative pain medications.  Take with a full glass of water daily.  If you experience loose stools or diarrhea, hold the colace until you stool forms back up.  If your symptoms do not get better within 1 week or if they get worse, check with your doctor.  Dulcolax (bisacodyl) - Pick up over-the-counter and take as directed by the product packaging as needed to assist with the movement of your bowels.  Take with a full glass of water.  Use this product as needed if not relieved by Colace only.   MiraLax (polyethylene glycol) - Pick up over-the-counter to have on hand.  MiraLax is a solution that will increase the amount of water in your bowels to assist with bowel movements.  Take as directed and can mix with a glass of water, juice, soda, coffee, or tea.  Take if you go more than two days without a movement. Do not use MiraLax more than  once per day. Call your doctor if you are still constipated or irregular after using this medication for 7 days in a row.  If you continue to have problems with postoperative constipation, please contact the office for further assistance and recommendations.  If you experience "the worst abdominal pain ever" or develop nausea or vomiting, please contact the office immediatly for further recommendations for  treatment.   CALL THE OFFICE FOR: Temperature above 101 degrees Excessive bleeding or drainage on the dressing. Excessive swelling, coldness, or paleness of the toes. Persistent nausea and vomiting.  FOLLOW-UP:  You should have an appointment to return to the office in 2 weeks after surgery. Arrangements have been made for continuation of Physical Therapy (either home therapy or outpatient therapy).

## 2023-06-05 NOTE — Discharge Summary (Incomplete)
Physician Discharge Summary  Patient ID: Lynn Potter MRN: [id number] DOB/AGE: 78-Apr-1947 78 y.o.  Admit date: 06/04/2023 Discharge date: 06/05/2023  Admission Diagnoses:  Osteoarthritis of left hip [M16.12]   Discharge Diagnoses: Patient Active Problem List   Diagnosis Date Noted   Osteoarthritis of left hip 06/04/2023   Grade 1 Anterolisthesis of lumbar spine (2mm) (L3-4) 07/17/2020   Thyroid nodule 08/18/2019   Iron deficiency anemia due to chronic blood loss 07/23/2019   SIRS (systemic inflammatory response syndrome) (HCC) 07/21/2019   HTN (hypertension) 07/21/2019   Diabetes (HCC) 07/21/2019   OSA (obstructive sleep apnea) 07/21/2019   AKI (acute kidney injury) (HCC) 07/21/2019   Microcytic anemia 07/05/2019   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   Preop testing 03/03/2019   Abnormal MRI, lumbar spine (09/26/18) 12/15/2018   Spondylosis without myelopathy or radiculopathy, lumbosacral region 12/02/2018   Chronic low back pain (1ry area of Pain) (Bilateral) (L>R) w/o sciatica 12/02/2018   Abnormal CT of  lumbar spine (10/11/2018) 12/02/2018   Lumbar facet hypertrophy (Multilevel) 12/02/2018   DDD (degenerative disc disease), lumbar 12/02/2018   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), w/ neurogenic claudication 12/02/2018   Osteoarthritis of facet joint of lumbar spine 12/02/2018   Osteoarthritis of lumbar spine 12/02/2018   Lumbar spondylosis 12/02/2018   Chronic hip pain (Left) 11/29/2018   Osteoarthritis of hip (Left) 11/29/2018   Lumbar facet syndrome (Bilateral) (L>R) 11/29/2018   Vitamin B12 deficiency 11/29/2018   Elevated C-reactive protein (CRP) 11/15/2018   Elevated sed rate 11/15/2018   Chronic low back pain (1ry area of Pain) (Bilateral) (L>R) w/ sciatica (Left) 11/09/2018   Chronic lower extremity pain (2ry area of Pain) (Left) 11/09/2018   Chronic shoulder pain (3ry area of Pain)  (Bilateral) (R>L) 11/09/2018   Pharmacologic therapy 11/09/2018   Disorder of skeletal system 11/09/2018   Problems influencing health status 11/09/2018   Acute diverticulitis 05/23/2018   Hyperglycemia 07/28/2017   Vitamin D insufficiency 03/25/2017   Chronic pain syndrome 03/16/2017   Encounter for long-term opiate analgesic use 03/16/2017   Long term current use of opiate analgesic 03/16/2017   Opiate use 03/16/2017   Degenerative joint disease (DJD) of hip 03/16/2017   Sacroiliac joint pain 03/16/2017   Sepsis (HCC) 09/30/2016   Osteoarthritis of shoulder (Right) 10/17/2015   Personal history of tobacco use, presenting hazards to health 06/18/2015   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   Rotator cuff tendinitis 03/10/2014   History of rotator cuff surgery (Bilateral) 03/10/2014    Past Medical History:  Diagnosis Date   AF (atrial fibrillation) (HCC)    a.) CHA2DS2-VASc: 31 (age x2, sex, HTN, vascular disease history, T2DM); b.) rate/rhythm maintained intrinsically without pharmacological intervention; no chronic oral anticoagulation   Aortic atherosclerosis (HCC)    Arthritis    Asthma    B12 deficiency    Bilateral carpal tunnel syndrome    CAD (coronary artery disease)    Cerebral microvascular disease    Chicken pox    Clotting disorder (HCC)    COPD (chronic obstructive pulmonary disease) (HCC)    DDD (degenerative disc disease), lumbosacral    a.) s/p L4-S1 fusion in 2006   Depression    Diabetic peripheral neuropathy (HCC)    Diastolic dysfunction 06/06/2022   a.) TTE 06/06/2022: EF >55%, no RWMAs, mild LVH, triv MR/TR, mild AR, RVSP 20.5, G1DD   Diverticulosis    DOE (dyspnea on exertion)    GERD (  gastroesophageal reflux disease)    Glaucoma    Hepatic steatosis    HH (hiatus hernia)    History of kidney stones    HLD (hyperlipidemia)    Hypertension    IDA (iron deficiency anemia)    Insomnia    a.) uses trazodone PRN   Internal  hemorrhoids 12/25/2014   LAFB (left anterior fascicular block)    Long term current use of aspirin    Lumbar spinal stenosis    Migraines    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   a.) Bx 10/18/2020 - spindle cell neoplasm; b.) s/p partial mastectomy 12/06/2020 --> final pathology revealed estrogen receptor (+) myofibroblastoma   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; well differentiated --> s/p bowel resection   Obesity    OSA (obstructive sleep apnea)    a.) non-compliant with prescribed nocturnal PAP therapy   Personal history of tobacco use, presenting hazards to health 06/18/2015   RLS (restless legs syndrome)    a.) on ropinirole   Status post carpal tunnel release    T2DM (type 2 diabetes mellitus) (HCC)    Tubular adenoma of colon      Transfusion: ***   Consultants (if any):   Discharged Condition: Improved  Hospital Course: Lynn Potter is an 77 y.o. female who was admitted 06/04/2023 with a diagnosis of Osteoarthritis of left hip and went to the operating room on 06/04/2023 and underwent the above named procedures.    Surgeries: Procedure(s): TOTAL HIP ARTHROPLASTY ANTERIOR APPROACH on 06/04/2023 Patient tolerated the surgery well. Taken to PACU where she was stabilized and then transferred to the orthopedic floor.  Started on Lovenox *** q *** hrs. TEDs and SCDs applied bilaterally. Heels elevated on bed. No evidence of DVT. Negative Homan. Physical therapy started on day #1 for gait training and transfer. OT started day #1 for ADL and assisted devices.  Patient's IV was d/c on day #1. Patient was able to safely and independently complete all PT goals. PT recommending discharge to home.    On post op day #1 patient was stable and ready for discharge to ***.  Implants: Cup: Trident Tritanium Clusterhole 3mm/C w/x 2 screws    Liner: MDM 36/C  Stem: Inisgnia #1 Std Offset  Head:LFIT V40 22.2 +2 w/ X3 MDM liner 22.2/36   She was given  perioperative antibiotics:  Anti-infectives (From admission, onward)    Start     Dose/Rate Route Frequency Ordered Stop   06/04/23 1430  ceFAZolin (ANCEF) IVPB 2g/100 mL premix        2 g 200 mL/hr over 30 Minutes Intravenous Every 6 hours 06/04/23 1422 06/04/23 2050   06/04/23 0600  ceFAZolin (ANCEF) IVPB 2g/100 mL premix        2 g 200 mL/hr over 30 Minutes Intravenous On call to O.R. 06/03/23 2141 06/04/23 1610     .  She was given sequential compression devices, early ambulation, and *** for DVT prophylaxis.  She benefited maximally from the hospital stay and there were no complications.    Recent vital signs:  Vitals:   06/05/23 0748 06/05/23 1301  BP: 114/69 124/60  Pulse: 65 82  Resp: 18 16  Temp: 98 F (36.7 C) 97.6 F (36.4 C)  SpO2: 99% 100%    Recent laboratory studies:  Lab Results  Component Value Date   HGB 9.7 (L) 06/05/2023   HGB 12.4 05/22/2023   HGB 11.8 (L) 08/01/2022   Lab  Results  Component Value Date   WBC 11.6 (H) 06/05/2023   PLT 239 06/05/2023   Lab Results  Component Value Date   INR 0.92 06/09/2017   Lab Results  Component Value Date   NA 134 (L) 06/05/2023   K 3.6 06/05/2023   CL 95 (L) 06/05/2023   CO2 25 06/05/2023   BUN 8 06/05/2023   CREATININE 0.78 06/05/2023   GLUCOSE 133 (H) 06/05/2023    Discharge Medications:   Allergies as of 06/05/2023   No Known Allergies   Med Rec must be completed prior to using this [facility]***       Diagnostic Studies: DG HIP UNILAT WITH PELVIS 1V LEFT  Result Date: 06/04/2023 CLINICAL DATA:  Elective surgery. Total left hip arthroplasty anterior approach. EXAM: DG HIP (WITH OR WITHOUT PELVIS) 1V*L* COMPARISON:  CT abdomen pelvis 02/14/2020 FINDINGS: Images were performed intraoperatively without the presence of a radiologist. New total left hip arthroplasty. No hardware complication is seen. Total fluoroscopy images: 3 Total fluoroscopy time: 22 seconds Total dose: Radiation Exposure  Index (as provided by the fluoroscopic device): 4.04 mGy air Kerma Please see intraoperative findings for further detail. IMPRESSION: Intraoperative fluoroscopy for total left hip arthroplasty. Electronically Signed   By: Neita Garnet M.D.   On: 06/04/2023 12:07   DG C-Arm 1-60 Min-No Report  Result Date: 06/04/2023 Fluoroscopy was utilized by the requesting physician.  No radiographic interpretation.   DG C-Arm 1-60 Min-No Report  Result Date: 06/04/2023 Fluoroscopy was utilized by the requesting physician.  No radiographic interpretation.   DG C-Arm 1-60 Min-No Report  Result Date: 06/04/2023 Fluoroscopy was utilized by the requesting physician.  No radiographic interpretation.    Disposition:      Follow-up Information     Evon Slack, PA-C Follow up in 2 week(s).   Specialties: Orthopedic Surgery, Emergency Medicine Contact information: [address] [place] [address] [phone]                  Signed: Patience Musca 06/05/2023, 1:22 PM

## 2023-06-05 NOTE — Progress Notes (Signed)
Pt transferred to room 134, bedside report given to Salmon, Charity fundraiser. Pt belongings sent with pt. (Glasses, cell phone, charger, upper and lower dentures and clothing).  Pts daughter notified that she was transferred.

## 2023-06-05 NOTE — Progress Notes (Signed)
PT Cancellation Note  Patient Details Name: Lynn Potter MRN: [id number] DOB: 04-26-1946   Cancelled Treatment:     PT attempt. Pt being transfers to floor. Acute PT will continue to follow per current POC.    Rushie Chestnut 06/05/2023, 1:31 PM

## 2023-06-05 NOTE — Progress Notes (Signed)
Physical Therapy Treatment Patient Details Name: Lynn Potter MRN: [id number] DOB: 10-16-1945 Today's Date: 06/05/2023   History of Present Illness 77 y/o female s/p L THA (anterior approach) on 06/04/23. PMH: asthma, T2DM, HTN, neuropathy, sleep apnea, glaucoma    PT Comments  Patient initially lethargic in bed but awakens with stimulation and agreeable to PT session. Required minA for bed mobility with increased time and assist for LLE management and repositioning hips towards EOB. Able to stand from EOB with minA and take steps towards recliner with cues for utilizing UE on RW to advance R LE. Notable tremors in standing but patient reports being baseline for ~7 years. Participated in quad sets, glute sets, and AAROM hip abduction while long sitting in recliner. Discharge plan remains appropriate.    If plan is discharge home, recommend the following: A lot of help with walking and/or transfers;A lot of help with bathing/dressing/bathroom;Assistance with cooking/housework;Help with stairs or ramp for entrance;Assist for transportation   Can travel by private vehicle     No  Equipment Recommendations  Rolling Sanaa Zilberman (2 wheels);BSC/3in1    Recommendations for Other Services       Precautions / Restrictions Precautions Precautions: Fall;Anterior Hip Precaution Booklet Issued: No Restrictions Weight Bearing Restrictions: Yes LLE Weight Bearing: Weight bearing as tolerated     Mobility  Bed Mobility Overal bed mobility: Needs Assistance Bed Mobility: Supine to Sit     Supine to sit: Min assist, HOB elevated, Used rails     General bed mobility comments: assist for LLE management and repositioning hips towards EOB    Transfers Overall transfer level: Needs assistance Equipment used: Rolling Kore Madlock (2 wheels) Transfers: Sit to/from Stand, Bed to chair/wheelchair/BSC Sit to Stand: Min assist   Step pivot transfers: Min assist       General transfer comment: assist to  boost into standing. Able to take steps towards recliner but notable tremors in standing which patient reports as baseline. Cues for utilizing UEs to assist with advancement of R LE    Ambulation/Gait                   Stairs             Wheelchair Mobility     Tilt Bed    Modified Rankin (Stroke Patients Only)       Balance Overall balance assessment: Needs assistance Sitting-balance support: Feet supported Sitting balance-Leahy Scale: Fair     Standing balance support: Bilateral upper extremity supported, Reliant on assistive device for balance Standing balance-Leahy Scale: Fair                              Cognition Arousal: Lethargic Behavior During Therapy: WFL for tasks assessed/performed, Flat affect Overall Cognitive Status: No family/caregiver present to determine baseline cognitive functioning                                 General Comments: initially lethargic but awakens with stimulation        Exercises Total Joint Exercises Ankle Circles/Pumps: AROM, Both, 10 reps Quad Sets: AROM, Left, 5 reps Gluteal Sets: AROM, Left, 5 reps Hip ABduction/ADduction: AAROM, Left, 5 reps    General Comments        Pertinent Vitals/Pain Pain Assessment Pain Assessment: Faces Faces Pain Scale: Hurts even more Pain Location: L hip Pain Descriptors / Indicators: Grimacing, Guarding,  Sore Pain Intervention(s): Monitored during session, Limited activity within patient's tolerance, Repositioned, Premedicated before session    Home Living                          Prior Function            PT Goals (current goals can now be found in the care plan section) Acute Rehab PT Goals PT Goal Formulation: With family Time For Goal Achievement: 06/19/23 Potential to Achieve Goals: Good Progress towards PT goals: Progressing toward goals    Frequency    Min 1X/week      PT Plan      Co-evaluation               AM-PAC PT "6 Clicks" Mobility   Outcome Measure  Help needed turning from your back to your side while in a flat bed without using bedrails?: A Little Help needed moving from lying on your back to sitting on the side of a flat bed without using bedrails?: A Little Help needed moving to and from a bed to a chair (including a wheelchair)?: A Little Help needed standing up from a chair using your arms (e.g., wheelchair or bedside chair)?: A Little Help needed to walk in hospital room?: A Lot Help needed climbing 3-5 steps with a railing? : Total 6 Click Score: 15    End of Session Equipment Utilized During Treatment: Gait belt Activity Tolerance: Patient limited by fatigue Patient left: in chair;with call bell/phone within reach Nurse Communication: Mobility status PT Visit Diagnosis: Other abnormalities of gait and mobility (R26.89);Muscle weakness (generalized) (M62.81);Pain Pain - Right/Left: Left Pain - part of body: Hip     Time: 0916-0940 PT Time Calculation (min) (ACUTE ONLY): 24 min  Charges:    $Therapeutic Exercise: 8-22 mins $Therapeutic Activity: 8-22 mins PT General Charges $$ ACUTE PT VISIT: 1 Visit                     Maylon Peppers, PT, DPT Physical Therapist - [facility]  [facility]    Kurt Azimi A Kynlea Blackston 06/05/2023, 11:39 AM

## 2023-06-05 NOTE — Plan of Care (Signed)
  Problem: Pain Management: Goal: Pain level will decrease with appropriate interventions Outcome: Progressing   Problem: Skin Integrity: Goal: Will show signs of wound healing Outcome: Progressing   Problem: Pain Management: Goal: Pain level will decrease with appropriate interventions Outcome: Progressing   Problem: Skin Integrity: Goal: Will show signs of wound healing Outcome: Progressing

## 2023-06-05 NOTE — Plan of Care (Signed)
Documented

## 2023-06-05 NOTE — Progress Notes (Signed)
Physical Therapy Treatment Patient Details Name: Lynn Potter MRN: [id number] DOB: 1946-06-19 Today's Date: 06/05/2023   History of Present Illness 77 y/o female s/p L THA (anterior approach) on 06/04/23. PMH: asthma, T2DM, HTN, neuropathy, sleep apnea, glaucoma    PT Comments  Patient notably fatigued upon arrival to room ;agreeable to limited participation with session with mod encouragement from therapist. Continues to require min/mod assist for all functional activities; very limited tolerance for L LE movement and WBing due to pain (meds received prior to session).  Less tremor than previous session, but does increase with prolonged standing; may consider orthostatic assessment next session if persists    Will plan to progress gait next session as appropriate.   If plan is discharge home, recommend the following: A lot of help with walking and/or transfers;A lot of help with bathing/dressing/bathroom;Assistance with cooking/housework;Help with stairs or ramp for entrance;Assist for transportation   Can travel by private vehicle     No  Equipment Recommendations       Recommendations for Other Services       Precautions / Restrictions Precautions Precautions: Fall;Anterior Hip Restrictions Weight Bearing Restrictions: Yes LLE Weight Bearing: Weight bearing as tolerated     Mobility  Bed Mobility Overal bed mobility: Needs Assistance Bed Mobility: Supine to Sit     Supine to sit: Min assist, Mod assist Sit to supine: Mod assist, Max assist   General bed mobility comments: assist for LLE management and repositioning hips towards EOB    Transfers Overall transfer level: Needs assistance Equipment used: Rolling walker (2 wheels) Transfers: Sit to/from Stand, Bed to chair/wheelchair/BSC Sit to Stand: Min assist Stand pivot transfers: Min assist         General transfer comment: heavy use of UEs to assist with weight shift and lift off during sit/stand; limited  tolerance for full postural (and bilat hip) extension once upright.  Less tremor than previous session, but does increase with prolonged standing; may consider orthostatic assessment next session if persists    Ambulation/Gait               General Gait Details: deferred   Stairs             Wheelchair Mobility     Tilt Bed    Modified Rankin (Stroke Patients Only)       Balance Overall balance assessment: Needs assistance Sitting-balance support: No upper extremity supported, Feet supported Sitting balance-Leahy Scale: Good       Standing balance-Leahy Scale: Fair                              Cognition Arousal: Alert Behavior During Therapy: WFL for tasks assessed/performed Overall Cognitive Status: Within Functional Limits for tasks assessed                                          Exercises Other Exercises Other Exercises: Toilet transfer, SPT with RW, min assist.  Very effortful, 3-point, step to gait pattern; limited tolerance for L LE WBing due to pain.  Noted with bladder incontinence during transfer; dep assist for hygiene, clothing change.    General Comments        Pertinent Vitals/Pain Pain Assessment Pain Assessment: Faces Faces Pain Scale: Hurts even more Pain Location: L hip Pain Descriptors / Indicators: Grimacing, Guarding, Sore Pain  Intervention(s): Limited activity within patient's tolerance, Monitored during session, Repositioned    Home Living                          Prior Function            PT Goals (current goals can now be found in the care plan section) Acute Rehab PT Goals Patient Stated Goal: family wondering if patient can go to rehab PT Goal Formulation: With family Time For Goal Achievement: 06/19/23 Potential to Achieve Goals: Good Progress towards PT goals: Progressing toward goals    Frequency    BID      PT Plan      Co-evaluation               AM-PAC PT "6 Clicks" Mobility   Outcome Measure  Help needed turning from your back to your side while in a flat bed without using bedrails?: A Little Help needed moving from lying on your back to sitting on the side of a flat bed without using bedrails?: A Little Help needed moving to and from a bed to a chair (including a wheelchair)?: A Little Help needed standing up from a chair using your arms (e.g., wheelchair or bedside chair)?: A Little Help needed to walk in hospital room?: A Lot Help needed climbing 3-5 steps with a railing? : A Lot 6 Click Score: 16    End of Session Equipment Utilized During Treatment: Gait belt Activity Tolerance: Patient limited by fatigue Patient left: with call bell/phone within reach;in bed;with bed alarm set Nurse Communication: Mobility status PT Visit Diagnosis: Other abnormalities of gait and mobility (R26.89);Muscle weakness (generalized) (M62.81);Pain Pain - Right/Left: Left Pain - part of body: Hip     Time: 0981-1914 PT Time Calculation (min) (ACUTE ONLY): 24 min  Charges:    $Therapeutic Activity: 23-37 mins PT General Charges $$ ACUTE PT VISIT: 1 Visit                    Torina Ey H. Manson Passey, PT, DPT, NCS 06/05/23, 5:04 PM [phone]

## 2023-06-05 NOTE — [place] FL2 (Signed)
[place] MEDICAID FL2 LEVEL OF CARE FORM     IDENTIFICATION  Patient Name: Lynn Potter Birthdate: 1945-12-04 Sex: female Admission Date (Current Location): 06/04/2023  [facility] and [place] Number:  Chiropodist and Address:  [facility] [address] [place], [place] [address]      Provider Number: [id number]  Attending Physician Name and Address:  Reinaldo Berber, MD  Relative Name and Phone Number:  [name] daughter [phone]    Current Level of Care: Hospital Recommended Level of Care: Skilled Nursing Facility Prior Approval Number:    Date Approved/Denied:   PASRR Number: [id number]  Discharge Plan: SNF    Current Diagnoses: Patient Active Problem List   Diagnosis Date Noted   Osteoarthritis of left hip 06/04/2023   Grade 1 Anterolisthesis of lumbar spine (2mm) (L3-4) 07/17/2020   Thyroid nodule 08/18/2019   Iron deficiency anemia due to chronic blood loss 07/23/2019   SIRS (systemic inflammatory response syndrome) (HCC) 07/21/2019   HTN (hypertension) 07/21/2019   Diabetes (HCC) 07/21/2019   OSA (obstructive sleep apnea) 07/21/2019   AKI (acute kidney injury) (HCC) 07/21/2019   Microcytic anemia 07/05/2019   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   Preop testing 03/03/2019   Abnormal MRI, lumbar spine (09/26/18) 12/15/2018   Spondylosis without myelopathy or radiculopathy, lumbosacral region 12/02/2018   Chronic low back pain (1ry area of Pain) (Bilateral) (L>R) w/o sciatica 12/02/2018   Abnormal CT of  lumbar spine (10/11/2018) 12/02/2018   Lumbar facet hypertrophy (Multilevel) 12/02/2018   DDD (degenerative disc disease), lumbar 12/02/2018   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), w/ neurogenic claudication 12/02/2018   Osteoarthritis of facet joint of lumbar spine 12/02/2018   Osteoarthritis of lumbar spine 12/02/2018   Lumbar spondylosis  12/02/2018   Chronic hip pain (Left) 11/29/2018   Osteoarthritis of hip (Left) 11/29/2018   Lumbar facet syndrome (Bilateral) (L>R) 11/29/2018   Vitamin B12 deficiency 11/29/2018   Elevated C-reactive protein (CRP) 11/15/2018   Elevated sed rate 11/15/2018   Chronic low back pain (1ry area of Pain) (Bilateral) (L>R) w/ sciatica (Left) 11/09/2018   Chronic lower extremity pain (2ry area of Pain) (Left) 11/09/2018   Chronic shoulder pain (3ry area of Pain) (Bilateral) (R>L) 11/09/2018   Pharmacologic therapy 11/09/2018   Disorder of skeletal system 11/09/2018   Problems influencing health status 11/09/2018   Acute diverticulitis 05/23/2018   Hyperglycemia 07/28/2017   Vitamin D insufficiency 03/25/2017   Chronic pain syndrome 03/16/2017   Encounter for long-term opiate analgesic use 03/16/2017   Long term current use of opiate analgesic 03/16/2017   Opiate use 03/16/2017   Degenerative joint disease (DJD) of hip 03/16/2017   Sacroiliac joint pain 03/16/2017   Sepsis (HCC) 09/30/2016   Osteoarthritis of shoulder (Right) 10/17/2015   Personal history of tobacco use, presenting hazards to health 06/18/2015   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   Rotator cuff tendinitis 03/10/2014   History of rotator cuff surgery (Bilateral) 03/10/2014    Orientation RESPIRATION BLADDER Height & Weight     Self, Time, Situation, Place  Normal Continent, External catheter Weight:   Height:     BEHAVIORAL SYMPTOMS/MOOD NEUROLOGICAL BOWEL NUTRITION STATUS      Continent Diet (See DC summary)  AMBULATORY STATUS COMMUNICATION OF NEEDS Skin   Limited Assist Verbally Surgical wounds, Normal  Personal Care Assistance Level of Assistance  Bathing, Feeding, Dressing Bathing Assistance: Limited assistance Feeding assistance: Limited assistance Dressing Assistance: Limited assistance     Functional Limitations Info  Sight, Hearing, Speech Sight Info:  Adequate Hearing Info: Adequate Speech Info: Adequate    SPECIAL CARE FACTORS FREQUENCY  PT (By licensed PT), OT (By licensed OT)     PT Frequency: 5 times per week OT Frequency: 5 times per week            Contractures Contractures Info: Not present    Additional Factors Info  Code Status, Allergies Code Status Info: Full code Allergies Info: NKDA           Current Medications (06/05/2023):  This is the current hospital active medication list Current Facility-Administered Medications  Medication Dose Route Frequency Provider Last Rate Last Admin   0.9 %  sodium chloride infusion   Intravenous Continuous Reinaldo Berber, MD 100 mL/hr at 06/04/23 1557 New Bag at 06/04/23 1557   acetaminophen (TYLENOL) tablet 1,000 mg  1,000 mg Oral Q8H Reinaldo Berber, MD   1,000 mg at 06/05/23 1610   aspirin EC tablet 81 mg  81 mg Oral Daily Reinaldo Berber, MD   81 mg at 06/05/23 1045   calcium carbonate (OS-CAL - dosed in mg of elemental calcium) tablet 1,250 mg  1 tablet Oral Q breakfast Reinaldo Berber, MD   1,250 mg at 06/05/23 0959   docusate sodium (COLACE) capsule 100 mg  100 mg Oral BID Reinaldo Berber, MD   100 mg at 06/05/23 0958   DULoxetine (CYMBALTA) DR capsule 30 mg  30 mg Oral q morning Reinaldo Berber, MD   30 mg at 06/05/23 0959   enoxaparin (LOVENOX) injection 40 mg  40 mg Subcutaneous Q24H Reinaldo Berber, MD   40 mg at 06/05/23 0816   gabapentin (NEURONTIN) capsule 300 mg  300 mg Oral TID Reinaldo Berber, MD   300 mg at 06/05/23 9604   irbesartan (AVAPRO) tablet 300 mg  300 mg Oral Daily Reinaldo Berber, MD   300 mg at 06/04/23 2138   And   hydrochlorothiazide (HYDRODIURIL) tablet 25 mg  25 mg Oral Daily Reinaldo Berber, MD   25 mg at 06/04/23 2139   HYDROcodone-acetaminophen (NORCO/VICODIN) 5-325 MG per tablet 1-2 tablet  1-2 tablet Oral Q4H PRN Reinaldo Berber, MD   1 tablet at 06/04/23 2140   insulin aspart (novoLOG) injection 0-15 Units  0-15 Units  Subcutaneous TID WC Reinaldo Berber, MD   3 Units at 06/04/23 1724   insulin aspart (novoLOG) injection 0-5 Units  0-5 Units Subcutaneous QHS Reinaldo Berber, MD       latanoprost (XALATAN) 0.005 % ophthalmic solution 1 drop  1 drop Both Eyes QHS Reinaldo Berber, MD   1 drop at 06/04/23 2138   menthol-cetylpyridinium (CEPACOL) lozenge 3 mg  1 lozenge Oral PRN Reinaldo Berber, MD       Or   phenol (CHLORASEPTIC) mouth spray 1 spray  1 spray Mouth/Throat PRN Reinaldo Berber, MD       metoCLOPramide (REGLAN) tablet 5-10 mg  5-10 mg Oral Q8H PRN Reinaldo Berber, MD       Or   metoCLOPramide (REGLAN) injection 5-10 mg  5-10 mg Intravenous Q8H PRN Reinaldo Berber, MD       morphine (PF) 4 MG/ML injection 0.52-1 mg  0.52-1 mg Intravenous Q2H PRN Reinaldo Berber, MD       ondansetron (ZOFRAN) tablet 4 mg  4 mg Oral Q6H PRN Aberman,  Earna Coder, MD       Or   ondansetron [facility]) injection 4 mg  4 mg Intravenous Q6H PRN Reinaldo Berber, MD   4 mg at 06/04/23 2028   pantoprazole (PROTONIX) EC tablet 40 mg  40 mg Oral Daily Reinaldo Berber, MD   40 mg at 06/05/23 0865   rOPINIRole (REQUIP) tablet 1 mg  1 mg Oral QHS Reinaldo Berber, MD   1 mg at 06/04/23 2140   [START ON 06/06/2023] rosuvastatin (CRESTOR) tablet 40 mg  40 mg Oral Daily Reinaldo Berber, MD       traMADol Janean Sark) tablet 50 mg  50 mg Oral Q6H PRN Reinaldo Berber, MD   50 mg at 06/05/23 7846   traZODone (DESYREL) tablet 50 mg  50 mg Oral QHS Reinaldo Berber, MD   50 mg at 06/04/23 2139     Discharge Medications: Please see discharge summary for a list of discharge medications.  Relevant Imaging Results:  Relevant Lab Results:   Additional Information SS# [id number]  Marlowe Sax, RN

## 2023-06-06 DIAGNOSIS — M1612 Unilateral primary osteoarthritis, left hip: Secondary | ICD-10-CM | POA: Diagnosis not present

## 2023-06-06 LAB — GLUCOSE, CAPILLARY
Glucose-Capillary: 136 mg/dL — ABNORMAL HIGH (ref 70–99)
Glucose-Capillary: 140 mg/dL — ABNORMAL HIGH (ref 70–99)
Glucose-Capillary: 126 mg/dL — ABNORMAL HIGH (ref 70–99)
Glucose-Capillary: 120 mg/dL — ABNORMAL HIGH (ref 70–99)

## 2023-06-06 LAB — CBC
HCT: 29.7 % — ABNORMAL LOW (ref 36.0–46.0)
Hemoglobin: 10 g/dL — ABNORMAL LOW (ref 12.0–15.0)
MCH: 27.3 pg (ref 26.0–34.0)
MCHC: 33.7 g/dL (ref 30.0–36.0)
MCV: 81.1 fL (ref 80.0–100.0)
Platelets: 202 10*3/uL (ref 150–400)
RBC: 3.66 MIL/uL — ABNORMAL LOW (ref 3.87–5.11)
RDW: 13.2 % (ref 11.5–15.5)
WBC: 7.8 10*3/uL (ref 4.0–10.5)
nRBC: 0 % (ref 0.0–0.2)

## 2023-06-06 MED ORDER — TRAMADOL HCL 50 MG PO TABS: 50.0000 mg | ORAL_TABLET | Freq: Four times a day (QID) | ORAL | 0 refills | Status: DC | PRN

## 2023-06-06 MED ORDER — CELECOXIB 100 MG PO CAPS: 100.0000 mg | ORAL_CAPSULE | Freq: Two times a day (BID) | ORAL | Status: AC

## 2023-06-06 MED ORDER — ONDANSETRON HCL 4 MG PO TABS: 4.0000 mg | ORAL_TABLET | Freq: Four times a day (QID) | ORAL | Status: DC | PRN

## 2023-06-06 MED ORDER — ENOXAPARIN SODIUM 40 MG/0.4ML IJ SOSY: 40.0000 mg | PREFILLED_SYRINGE | INTRAMUSCULAR | 0 refills | Status: DC

## 2023-06-06 MED ORDER — DOCUSATE SODIUM 100 MG PO CAPS: 100.0000 mg | ORAL_CAPSULE | Freq: Two times a day (BID) | ORAL | Status: DC

## 2023-06-06 NOTE — Progress Notes (Addendum)
Subjective: 2 Days Post-Op Procedure(s) (LRB): TOTAL HIP ARTHROPLASTY ANTERIOR APPROACH (Left) Patient reports pain as mild.  She describes soreness in her left anterior thigh. Patient is well, and has had no acute complaints or problems Denies any CP, SOB, ABD pain. Positive bowel movement yesterday. We will continue therapy today.  Slow progress with PT Plan is to go Skilled nursing facility after hospital stay.  Objective: Vital signs in last 24 hours: Temp:  [97.6 F (36.4 C)-98.8 F (37.1 C)] 98.5 F (36.9 C) (08/31 0755) Pulse Rate:  [68-92] 91 (08/31 0755) Resp:  [14-20] 16 (08/31 0755) BP: (106-141)/(60-95) 124/63 (08/31 0755) SpO2:  [94 %-100 %] 94 % (08/31 0755)  Intake/Output from previous day: 08/30 0701 - 08/31 0700 In: 220 [P.O.:220] Out: -  Intake/Output this shift: No intake/output data recorded.  Recent Labs    06/05/23 0547 06/06/23 0442  HGB 9.7* 10.0*   Recent Labs    06/05/23 0547 06/06/23 0442  WBC 11.6* 7.8  RBC 3.62* 3.66*  HCT 29.7* 29.7*  PLT 239 202   Recent Labs    06/05/23 0547  NA 134*  K 3.6  CL 95*  CO2 25  BUN 8  CREATININE 0.78  GLUCOSE 133*  CALCIUM 8.9   No results for input(s): "LABPT", "INR" in the last 72 hours.  EXAM General - Patient is Alert, Appropriate, and Oriented Extremity - Neurovascular intact Sensation intact distally Intact pulses distally Dorsiflexion/Plantar flexion intact Abdomen-soft nontender nondistended Dressing - dressing C/D/I and no drainage Motor Function - intact, moving foot and toes well on exam.   Past Medical History:  Diagnosis Date   AF (atrial fibrillation) (HCC)    a.) CHA2DS2-VASc: 63 (age x2, sex, HTN, vascular disease history, T2DM); b.) rate/rhythm maintained intrinsically without pharmacological intervention; no chronic oral anticoagulation   Aortic atherosclerosis (HCC)    Arthritis    Asthma    B12 deficiency    Bilateral carpal tunnel syndrome    CAD (coronary  artery disease)    Cerebral microvascular disease    Chicken pox    Clotting disorder (HCC)    COPD (chronic obstructive pulmonary disease) (HCC)    DDD (degenerative disc disease), lumbosacral    a.) s/p L4-S1 fusion in 2006   Depression    Diabetic peripheral neuropathy (HCC)    Diastolic dysfunction 06/06/2022   a.) TTE 06/06/2022: EF >55%, no RWMAs, mild LVH, triv MR/TR, mild AR, RVSP 20.5, G1DD   Diverticulosis    DOE (dyspnea on exertion)    GERD (gastroesophageal reflux disease)    Glaucoma    Hepatic steatosis    HH (hiatus hernia)    History of kidney stones    HLD (hyperlipidemia)    Hypertension    IDA (iron deficiency anemia)    Insomnia    a.) uses trazodone PRN   Internal hemorrhoids 12/25/2014   LAFB (left anterior fascicular block)    Long term current use of aspirin    Lumbar spinal stenosis    Migraines    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   a.) Bx 10/18/2020 - spindle cell neoplasm; b.) s/p partial mastectomy 12/06/2020 --> final pathology revealed estrogen receptor (+) myofibroblastoma   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; well differentiated --> s/p bowel resection   Obesity    OSA (obstructive sleep apnea)    a.) non-compliant with prescribed nocturnal PAP therapy   Personal history of tobacco use, presenting hazards to health 06/18/2015  RLS (restless legs syndrome)    a.) on ropinirole   Status post carpal tunnel release    T2DM (type 2 diabetes mellitus) (HCC)    Tubular adenoma of colon     Assessment/Plan:   2 Days Post-Op Procedure(s) (LRB): TOTAL HIP ARTHROPLASTY ANTERIOR APPROACH (Left) Principal Problem:   Osteoarthritis of left hip  Estimated body mass index is 36.09 kg/m as calculated from the following:   Height as of 05/22/23: 5\' 1"  (1.549 m).   Weight as of 05/22/23: 86.6 kg. Advance diet Up with therapy Pain well-controlled Labs and vital signs are stable Hemoglobin trending up  10.0. Care management to assist with discharge to skilled nursing facility.  Stable for discharge to skilled nursing facility today pending authorization/availability.  Patient lives at home alone, slow progress with therapy.     DVT Prophylaxis - Lovenox, TED hose, and SCDs Weight-Bearing as tolerated to left leg   T. Cranston Neighbor, PA-C [facility] 06/06/2023, 8:29 AM

## 2023-06-06 NOTE — Plan of Care (Signed)
  Problem: Education: Goal: Ability to describe self-care measures that may prevent or decrease complications (Diabetes Survival Skills Education) will improve Outcome: Progressing   Problem: Coping: Goal: Ability to adjust to condition or change in health will improve Outcome: Progressing   Problem: Fluid Volume: Goal: Ability to maintain a balanced intake and output will improve Outcome: Progressing   Problem: Health Behavior/Discharge Planning: Goal: Ability to identify and utilize available resources and services will improve Outcome: Progressing   Problem: Metabolic: Goal: Ability to maintain appropriate glucose levels will improve Outcome: Progressing   Problem: Nutritional: Goal: Maintenance of adequate nutrition will improve Outcome: Progressing   Problem: Skin Integrity: Goal: Risk for impaired skin integrity will decrease Outcome: Progressing   Problem: Tissue Perfusion: Goal: Adequacy of tissue perfusion will improve Outcome: Progressing   Problem: Education: Goal: Knowledge of the prescribed therapeutic regimen will improve Outcome: Progressing   Problem: Activity: Goal: Ability to avoid complications of mobility impairment will improve Outcome: Progressing   Problem: Education: Goal: Knowledge of the prescribed therapeutic regimen will improve Outcome: Progressing   Problem: Activity: Goal: Ability to avoid complications of mobility impairment will improve Outcome: Progressing   Problem: Clinical Measurements: Goal: Postoperative complications will be avoided or minimized Outcome: Progressing

## 2023-06-06 NOTE — Plan of Care (Signed)
  Problem: Education: Goal: Ability to describe self-care measures that may prevent or decrease complications (Diabetes Survival Skills Education) will improve Outcome: Progressing Goal: Individualized Educational Video(s) Outcome: Progressing   Problem: Coping: Goal: Ability to adjust to condition or change in health will improve Outcome: Progressing   Problem: Fluid Volume: Goal: Ability to maintain a balanced intake and output will improve Outcome: Progressing   Problem: Health Behavior/Discharge Planning: Goal: Ability to identify and utilize available resources and services will improve Outcome: Progressing Goal: Ability to manage health-related needs will improve Outcome: Progressing   Problem: Metabolic: Goal: Ability to maintain appropriate glucose levels will improve Outcome: Progressing   Problem: Nutritional: Goal: Maintenance of adequate nutrition will improve Outcome: Progressing Goal: Progress toward achieving an optimal weight will improve Outcome: Progressing   Problem: Skin Integrity: Goal: Risk for impaired skin integrity will decrease Outcome: Progressing   Problem: Tissue Perfusion: Goal: Adequacy of tissue perfusion will improve Outcome: Progressing   Problem: Education: Goal: Knowledge of the prescribed therapeutic regimen will improve Outcome: Progressing Goal: Understanding of discharge needs will improve Outcome: Progressing Goal: Individualized Educational Video(s) Outcome: Progressing   Problem: Activity: Goal: Ability to avoid complications of mobility impairment will improve Outcome: Progressing Goal: Ability to tolerate increased activity will improve Outcome: Progressing   Problem: Clinical Measurements: Goal: Postoperative complications will be avoided or minimized Outcome: Progressing   Problem: Pain Management: Goal: Pain level will decrease with appropriate interventions Outcome: Progressing   Problem: Skin  Integrity: Goal: Will show signs of wound healing Outcome: Progressing   Problem: Education: Goal: Knowledge of the prescribed therapeutic regimen will improve Outcome: Progressing Goal: Understanding of discharge needs will improve Outcome: Progressing Goal: Individualized Educational Video(s) Outcome: Progressing   Problem: Activity: Goal: Ability to avoid complications of mobility impairment will improve Outcome: Progressing Goal: Ability to tolerate increased activity will improve Outcome: Progressing   Problem: Clinical Measurements: Goal: Postoperative complications will be avoided or minimized Outcome: Progressing   Problem: Pain Management: Goal: Pain level will decrease with appropriate interventions Outcome: Progressing   Problem: Skin Integrity: Goal: Will show signs of wound healing Outcome: Progressing   Problem: Education: Goal: Knowledge of General Education information will improve Description: Including pain rating scale, medication(s)/side effects and non-pharmacologic comfort measures Outcome: Progressing   Problem: Health Behavior/Discharge Planning: Goal: Ability to manage health-related needs will improve Outcome: Progressing   Problem: Clinical Measurements: Goal: Ability to maintain clinical measurements within normal limits will improve Outcome: Progressing Goal: Will remain free from infection Outcome: Progressing Goal: Diagnostic test results will improve Outcome: Progressing Goal: Respiratory complications will improve Outcome: Progressing Goal: Cardiovascular complication will be avoided Outcome: Progressing   Problem: Activity: Goal: Risk for activity intolerance will decrease Outcome: Progressing   Problem: Nutrition: Goal: Adequate nutrition will be maintained Outcome: Progressing   Problem: Coping: Goal: Level of anxiety will decrease Outcome: Progressing   Problem: Elimination: Goal: Will not experience complications  related to bowel motility Outcome: Progressing Goal: Will not experience complications related to urinary retention Outcome: Progressing   Problem: Pain Managment: Goal: General experience of comfort will improve Outcome: Progressing   Problem: Safety: Goal: Ability to remain free from injury will improve Outcome: Progressing   Problem: Skin Integrity: Goal: Risk for impaired skin integrity will decrease Outcome: Progressing

## 2023-06-06 NOTE — Progress Notes (Signed)
Physical Therapy Treatment Patient Details Name: Lynn Potter MRN: [id number] DOB: 1946/03/21 Today's Date: 06/06/2023   History of Present Illness 77 y/o female s/p L THA (anterior approach) on 06/04/23. PMH: asthma, T2DM, HTN, neuropathy, sleep apnea, glaucoma    PT Comments  Pt fatigued upon pm return after sitting up in chair for ~2 hours and receiving pain meds. Pt continues to require Mod/MaxA for transfers and safe side stepping with RW bed to chair due to fatigue and dyskinesia like movements resulting in random jerking placing pt at high risk for falls. Unable to functionally progress pt this date. Pt's family is requesting d/c to [facility] once medically stable.    If plan is discharge home, recommend the following: A lot of help with walking and/or transfers;A lot of help with bathing/dressing/bathroom;Assistance with cooking/housework;Help with stairs or ramp for entrance;Assist for transportation   Can travel by private vehicle     No  Equipment Recommendations  None recommended by PT    Recommendations for Other Services       Precautions / Restrictions Precautions Precautions: Fall;Anterior Hip Precaution Booklet Issued: Yes (comment) Precaution Comments:  (Reviewed Anterior Hip precaution booklet and HEP with pt and daughter) Restrictions Weight Bearing Restrictions: Yes LLE Weight Bearing: Weight bearing as tolerated Other Position/Activity Restrictions:  (Ant. L Total Hip Precautions)     Mobility  Bed Mobility Overal bed mobility: Needs Assistance Bed Mobility: Sit to Supine     Supine to sit: Mod assist, Max assist Sit to supine: Max assist   General bed mobility comments:  (Increased assist due to fatigue and tremor/jerky movement)    Transfers Overall transfer level: Needs assistance Equipment used: Rolling walker (2 wheels) Transfers: Sit to/from Stand Sit to Stand: Max assist   Step pivot transfers: Max assist       General transfer  comment: heavy use of UEs on RW to assist with weight, Mod/MaxA  to maintain upright standing and maneuver RW in order to turn from chair to bed towards R side    Ambulation/Gait               General Gait Details: deferred, due to tremors and pain   Stairs             Wheelchair Mobility     Tilt Bed    Modified Rankin (Stroke Patients Only)       Balance Overall balance assessment: Needs assistance Sitting-balance support: No upper extremity supported, Feet supported Sitting balance-Leahy Scale: Fair Sitting balance - Comments: Jerky tremors in sitting, unable to hold cup of meds without spilling twice   Standing balance support: Bilateral upper extremity supported, Reliant on assistive device for balance Standing balance-Leahy Scale: Poor                              Cognition Arousal: Lethargic Behavior During Therapy: Flat affect Overall Cognitive Status: Within Functional Limits for tasks assessed                                 General Comments:  (Lethargic from pain meds received earlier)        Exercises Total Joint Exercises Ankle Circles/Pumps: AROM, Both, 10 reps Long Arc Quad: AROM, Both, 10 reps, Seated Other Exercises Other Exercises: Toilet transfer, SPT with RW, min assist.  Very effortful, 3-point, step to gait pattern; limited tolerance  for L LE WBing due to pain.  Noted with bladder incontinence during transfer; dep assist for hygiene, clothing change.    General Comments General comments (skin integrity, edema, etc.): High risk for falling when uncontrolled jerky movoement occurs and pt is unable to safely recorrect self.      Pertinent Vitals/Pain Pain Assessment Pain Assessment: 0-10 Pain Score: 6  Pain Location: L hip Pain Descriptors / Indicators: Grimacing, Guarding, Sore Pain Intervention(s): Limited activity within patient's tolerance    Home Living                          Prior  Function            PT Goals (current goals can now be found in the care plan section) Acute Rehab PT Goals Patient Stated Goal: family wondering if patient can go to [facility]    Frequency    BID      PT Plan      Co-evaluation              AM-PAC PT "6 Clicks" Mobility   Outcome Measure  Help needed turning from your back to your side while in a flat bed without using bedrails?: A Little Help needed moving from lying on your back to sitting on the side of a flat bed without using bedrails?: A Lot Help needed moving to and from a bed to a chair (including a wheelchair)?: A Lot Help needed standing up from a chair using your arms (e.g., wheelchair or bedside chair)?: A Lot Help needed to walk in hospital room?: A Lot Help needed climbing 3-5 steps with a railing? : A Lot 6 Click Score: 13    End of Session Equipment Utilized During Treatment: Gait belt Activity Tolerance: Patient limited by fatigue;Patient limited by pain;Other (comment) (Tremors/jerky movements) Patient left: in bed;with call bell/phone within reach;with nursing/sitter in room;with family/visitor present Nurse Communication: Mobility status PT Visit Diagnosis: Other abnormalities of gait and mobility (R26.89);Muscle weakness (generalized) (M62.81);Pain Pain - Right/Left: Left Pain - part of body: Hip     Time: 1191-4782 PT Time Calculation (min) (ACUTE ONLY): 40 min  Charges:    $Therapeutic Exercise: 8-22 mins $Therapeutic Activity: 23-37 mins PT General Charges $$ ACUTE PT VISIT: 1 Visit                    Zadie Cleverly, PTA  Jannet Askew 06/06/2023, 2:49 PM

## 2023-06-06 NOTE — Progress Notes (Signed)
Physical Therapy Treatment Patient Details Name: Lynn Potter MRN: [id number] DOB: 04/18/1946 Today's Date: 06/06/2023   History of Present Illness 77 y/o female s/p L THA (anterior approach) on 06/04/23. PMH: asthma, T2DM, HTN, neuropathy, sleep apnea, glaucoma    PT Comments  Increased assistance with mobility and transfers due to pt experiencing incontinence and increased tremors with difficulty holding cups and utensils. Pt states this has occurred in the past, unable to state reason. Pt required Mod/MaxA for bed mobility and sit<>stand transfers with RW. Assistance to maneuver RW and weight shift in order to side step to Right side for BSC use. Unable to progress gait training this date.  Family states pt has been wheeling herself around at home in a manual w/c due to Left hip pain. Pt is generally a household ambulator with support of Rollator. Will attempt progressive mobility after lunch.    If plan is discharge home, recommend the following: A lot of help with walking and/or transfers;A lot of help with bathing/dressing/bathroom;Assistance with cooking/housework;Help with stairs or ramp for entrance;Assist for transportation   Can travel by private vehicle     No  Equipment Recommendations  None recommended by PT (TBD at next level of care)    Recommendations for Other Services       Precautions / Restrictions Precautions Precautions: Fall;Anterior Hip Precaution Booklet Issued: No Restrictions Weight Bearing Restrictions: Yes LLE Weight Bearing: Weight bearing as tolerated     Mobility  Bed Mobility Overal bed mobility: Needs Assistance Bed Mobility: Supine to Sit     Supine to sit: Mod assist, Max assist     General bed mobility comments: Increased assist for LLE management and repositioning hips towards EOB due to tremors    Transfers Overall transfer level: Needs assistance Equipment used: Rolling walker (2 wheels) Transfers: Sit to/from Stand, Bed to  chair/wheelchair/BSC Sit to Stand: Mod assist, Max assist           General transfer comment: heavy use of UEs to assist with weight shift and lift off during sit/stand; limited tolerance for full postural (and bilat hip) extension once upright.  Increased tremor than previous previous day, denies dizziness    Ambulation/Gait               General Gait Details: deferred, due to tremors and pain   Stairs             Wheelchair Mobility     Tilt Bed    Modified Rankin (Stroke Patients Only)       Balance Overall balance assessment: Needs assistance Sitting-balance support: No upper extremity supported, Feet supported Sitting balance-Leahy Scale: Fair Sitting balance - Comments: Jerky tremors in sitting, unable to hold cup of meds without spilling twice   Standing balance support: Bilateral upper extremity supported, Reliant on assistive device for balance Standing balance-Leahy Scale: Fair                              Cognition Arousal: Alert Behavior During Therapy: WFL for tasks assessed/performed Overall Cognitive Status: Within Functional Limits for tasks assessed                                 General Comments: initially lethargic but awakens with stimulation        Exercises Total Joint Exercises Ankle Circles/Pumps: AROM, Both, 10 reps Long Arc Quad: AROM,  Both, 10 reps, Seated Other Exercises Other Exercises: Toilet transfer, SPT with RW, min assist.  Very effortful, 3-point, step to gait pattern; limited tolerance for L LE WBing due to pain.  Noted with bladder incontinence during transfer; dep assist for hygiene, clothing change.    General Comments        Pertinent Vitals/Pain Pain Assessment Pain Assessment: 0-10 Pain Score: 6  Pain Location: L hip Pain Descriptors / Indicators: Grimacing, Guarding, Sore Pain Intervention(s): Premedicated before session    Home Living                           Prior Function            PT Goals (current goals can now be found in the care plan section) Acute Rehab PT Goals Patient Stated Goal: family wondering if patient can go to rehab    Frequency    BID      PT Plan      Co-evaluation              AM-PAC PT "6 Clicks" Mobility   Outcome Measure  Help needed turning from your back to your side while in a flat bed without using bedrails?: A Little Help needed moving from lying on your back to sitting on the side of a flat bed without using bedrails?: A Lot Help needed moving to and from a bed to a chair (including a wheelchair)?: A Lot Help needed standing up from a chair using your arms (e.g., wheelchair or bedside chair)?: A Lot Help needed to walk in hospital room?: A Lot Help needed climbing 3-5 steps with a railing? : A Lot 6 Click Score: 13    End of Session Equipment Utilized During Treatment: Gait belt Activity Tolerance: Patient limited by fatigue;Patient limited by pain;Other (comment) (Tremors) Patient left: in chair;with call bell/phone within reach;with chair alarm set;with family/visitor present Nurse Communication: Mobility status PT Visit Diagnosis: Other abnormalities of gait and mobility (R26.89);Muscle weakness (generalized) (M62.81);Pain Pain - Right/Left: Left Pain - part of body: Hip     Time: 9563-8756 PT Time Calculation (min) (ACUTE ONLY): 51 min  Charges:    $Therapeutic Exercise: 8-22 mins $Therapeutic Activity: 23-37 mins PT General Charges $$ ACUTE PT VISIT: 1 Visit                    Zadie Cleverly, PTA  Jannet Askew 06/06/2023, 11:22 AM

## 2023-06-06 NOTE — Plan of Care (Signed)
  Problem: Education: Goal: Ability to describe self-care measures that may prevent or decrease complications (Diabetes Survival Skills Education) will improve Outcome: Progressing   Problem: Coping: Goal: Ability to adjust to condition or change in health will improve Outcome: Progressing   Problem: Fluid Volume: Goal: Ability to maintain a balanced intake and output will improve Outcome: Progressing   Problem: Health Behavior/Discharge Planning: Goal: Ability to identify and utilize available resources and services will improve Outcome: Progressing Goal: Ability to manage health-related needs will improve Outcome: Progressing   Problem: Metabolic: Goal: Ability to maintain appropriate glucose levels will improve Outcome: Progressing   Problem: Nutritional: Goal: Maintenance of adequate nutrition will improve Outcome: Progressing   Problem: Skin Integrity: Goal: Risk for impaired skin integrity will decrease Outcome: Progressing   Problem: Tissue Perfusion: Goal: Adequacy of tissue perfusion will improve Outcome: Progressing   Problem: Activity: Goal: Ability to avoid complications of mobility impairment will improve Outcome: Progressing   Problem: Clinical Measurements: Goal: Postoperative complications will be avoided or minimized Outcome: Progressing   Problem: Skin Integrity: Goal: Will show signs of wound healing Outcome: Progressing   Problem: Skin Integrity: Goal: Will show signs of wound healing Outcome: Progressing

## 2023-06-07 ENCOUNTER — Observation Stay: Payer: 59

## 2023-06-07 DIAGNOSIS — I482 Chronic atrial fibrillation, unspecified: Secondary | ICD-10-CM

## 2023-06-07 DIAGNOSIS — G9340 Encephalopathy, unspecified: Secondary | ICD-10-CM

## 2023-06-07 DIAGNOSIS — E785 Hyperlipidemia, unspecified: Secondary | ICD-10-CM

## 2023-06-07 DIAGNOSIS — I10 Essential (primary) hypertension: Secondary | ICD-10-CM

## 2023-06-07 DIAGNOSIS — E871 Hypo-osmolality and hyponatremia: Secondary | ICD-10-CM | POA: Diagnosis not present

## 2023-06-07 DIAGNOSIS — M1612 Unilateral primary osteoarthritis, left hip: Secondary | ICD-10-CM | POA: Diagnosis not present

## 2023-06-07 LAB — COMPREHENSIVE METABOLIC PANEL
ALT: 17 U/L (ref 0–44)
AST: 18 U/L (ref 15–41)
Albumin: 2.4 g/dL — ABNORMAL LOW (ref 3.5–5.0)
Alkaline Phosphatase: 62 U/L (ref 38–126)
Anion gap: 8 (ref 5–15)
BUN: 8 mg/dL (ref 8–23)
CO2: 27 mmol/L (ref 22–32)
Calcium: 8.5 mg/dL — ABNORMAL LOW (ref 8.9–10.3)
Chloride: 91 mmol/L — ABNORMAL LOW (ref 98–111)
Creatinine, Ser: 0.56 mg/dL (ref 0.44–1.00)
GFR, Estimated: >60 mL/min (ref >60)
Glucose, Bld: 128 mg/dL — ABNORMAL HIGH (ref 70–99)
Potassium: 3.1 mmol/L — ABNORMAL LOW (ref 3.5–5.1)
Sodium: 127 mmol/L — ABNORMAL LOW (ref 135–145)
Total Bilirubin: 1.3 mg/dL — ABNORMAL HIGH (ref 0.3–1.2)
Total Protein: 5.9 g/dL — ABNORMAL LOW (ref 6.5–8.1)

## 2023-06-07 LAB — URINALYSIS, COMPLETE (UACMP) WITH MICROSCOPIC
Bacteria, UA: NONE SEEN
Bilirubin Urine: NEGATIVE
Glucose, UA: NEGATIVE mg/dL
Ketones, ur: 15 mg/dL — AB
Leukocytes,Ua: NEGATIVE
Nitrite: NEGATIVE
Protein, ur: NEGATIVE mg/dL
Specific Gravity, Urine: 1.015 (ref 1.005–1.030)
pH: 6.5 (ref 5.0–8.0)

## 2023-06-07 LAB — TROPONIN I (HIGH SENSITIVITY)
Troponin I (High Sensitivity): 4 ng/L (ref <18)
Troponin I (High Sensitivity): 5 ng/L (ref <18)

## 2023-06-07 LAB — CBC
HCT: 29.4 % — ABNORMAL LOW (ref 36.0–46.0)
Hemoglobin: 9.8 g/dL — ABNORMAL LOW (ref 12.0–15.0)
MCH: 27.2 pg (ref 26.0–34.0)
MCHC: 33.3 g/dL (ref 30.0–36.0)
MCV: 81.7 fL (ref 80.0–100.0)
Platelets: 206 10*3/uL (ref 150–400)
RBC: 3.6 MIL/uL — ABNORMAL LOW (ref 3.87–5.11)
RDW: 13.3 % (ref 11.5–15.5)
WBC: 8 10*3/uL (ref 4.0–10.5)
nRBC: 0 % (ref 0.0–0.2)

## 2023-06-07 LAB — GLUCOSE, CAPILLARY
Glucose-Capillary: 113 mg/dL — ABNORMAL HIGH (ref 70–99)
Glucose-Capillary: 155 mg/dL — ABNORMAL HIGH (ref 70–99)
Glucose-Capillary: 117 mg/dL — ABNORMAL HIGH (ref 70–99)
Glucose-Capillary: 120 mg/dL — ABNORMAL HIGH (ref 70–99)

## 2023-06-07 LAB — SODIUM
Sodium: 130 mmol/L — ABNORMAL LOW (ref 135–145)
Sodium: 130 mmol/L — ABNORMAL LOW (ref 135–145)
Sodium: 131 mmol/L — ABNORMAL LOW (ref 135–145)

## 2023-06-07 LAB — BLOOD GAS, VENOUS
Acid-Base Excess: 5.5 mmol/L — ABNORMAL HIGH (ref 0.0–2.0)
Bicarbonate: 31.1 mmol/L — ABNORMAL HIGH (ref 20.0–28.0)
O2 Saturation: 60.7 %
Patient temperature: 37
pCO2, Ven: 48 mmHg (ref 44–60)
pH, Ven: 7.42 (ref 7.25–7.43)
pO2, Ven: 36 mmHg (ref 32–45)

## 2023-06-07 LAB — CK: Total CK: 405 U/L — ABNORMAL HIGH (ref 38–234)

## 2023-06-07 LAB — BRAIN NATRIURETIC PEPTIDE: B Natriuretic Peptide: 28.7 pg/mL (ref 0.0–100.0)

## 2023-06-07 MED ORDER — POTASSIUM CHLORIDE CRYS ER 20 MEQ PO TBCR
20.0000 meq | EXTENDED_RELEASE_TABLET | Freq: Two times a day (BID) | ORAL | Status: DC
  Administered 2023-06-07 – 2023-06-08 (×3): 20 meq via ORAL
  Filled 2023-06-07 (×3): qty 1

## 2023-06-07 MED ORDER — SODIUM CHLORIDE 0.9 % IV BOLUS
500.0000 mL | Freq: Once | INTRAVENOUS | Status: AC
  Administered 2023-06-07: 500 mL via INTRAVENOUS

## 2023-06-07 MED ORDER — SODIUM CHLORIDE 0.9 % IV SOLN: INTRAVENOUS | Status: DC

## 2023-06-07 NOTE — Assessment & Plan Note (Signed)
Sodium 127 today Patient has not received IV fluids over several days per report Looks to be mildly dry Will place on gentle IV fluid hydration Hold hydrochlorothiazide Likely contributory to overall presentation of encephalopathy Check urine and serum studies Otherwise monitor closely

## 2023-06-07 NOTE — Consult Note (Signed)
Initial Consultation Note   Patient: Lynn Potter:[id number] DOB: [birth year] PCP: Armando Gang, FNP DOA: 06/04/2023 DOS: the patient was seen and examined on 06/07/2023 Primary service: Reinaldo Berber, MD  Referring physician: Audelia Acton  Reason for consult: lethargy   Assessment/Plan: Assessment and Plan: Hyponatremia Sodium 127 today Patient has not received IV fluids over several days per report Looks to be mildly dry Will place on gentle IV fluid hydration Hold hydrochlorothiazide Likely contributory to overall presentation of encephalopathy Check urine and serum studies Otherwise monitor closely  Encephalopathy Positive generalized lethargy x 1 to 2 days per nursing Noted to have received multiple doses of neurotropic/sedating medications including Neurontin, trazodone, Requip, Cymbalta while lethargic per report  Grossly non-focal neuro exam though w/ ? Trace RUE weakness vs. exam compliance  Chest x-ray stable apart from?  Bronchitis versus edema WBC WNL Urinalysis not indicative infection Will check MRI brain x 1  Suspect contribution of polypharmacy given overall presentation  Hold sedating medications  Noted Na 127- likely confounding issue  Check ammonia level  Check CK level  Neuro consult as clinically indicated        [facility] will continue to follow the patient.  HPI: Lynn Potter is a 77 y.o. female with past medical history of atrial fibrillation, asthma, CAD, HFpEF, GERD, hyperlipidemia obesity, OSA currently admitted on the orthopedic service for total hip arthroplasty on the left.  Postop day 3.  Was notified about patient have her lethargy.  Per nursing, patient with noted lethargy since yesterday.  No reported hemiparesis.  Has had worsening fatigue.  Per nursing, despite fatigue, patient has had multiple sedating medications including Lyrica, trazodone, Neurontin.  No complaints about chest pain shortness of breath nausea or vomiting.  Currently not  on IV fluids.  No labs drawn over the past 24 hours per nursing. Currently afebrile, hemodynamically stable, satting well on room air.  A.m. chest x-ray with nonspecific changes with concern for possible edema versus atypical infection.?  Bronchitis.  White count 8, hemoglobin 9.8, platelets 206, creatinine 0.56.  Sodium 127, potassium 3.1.  Urinalysis not indicative of infection.  Review of Systems: As mentioned in the history of present illness. All other systems reviewed and are negative. Past Medical History:  Diagnosis Date   AF (atrial fibrillation) (HCC)    a.) CHA2DS2-VASc: 78 (age x2, sex, HTN, vascular disease history, T2DM); b.) rate/rhythm maintained intrinsically without pharmacological intervention; no chronic oral anticoagulation   Aortic atherosclerosis (HCC)    Arthritis    Asthma    B12 deficiency    Bilateral carpal tunnel syndrome    CAD (coronary artery disease)    Cerebral microvascular disease    Chicken pox    Clotting disorder (HCC)    COPD (chronic obstructive pulmonary disease) (HCC)    DDD (degenerative disc disease), lumbosacral    a.) s/p L4-S1 fusion in 2006   Depression    Diabetic peripheral neuropathy (HCC)    Diastolic dysfunction 06/06/2022   a.) TTE 06/06/2022: EF >55%, no RWMAs, mild LVH, triv MR/TR, mild AR, RVSP 20.5, G1DD   Diverticulosis    DOE (dyspnea on exertion)    GERD (gastroesophageal reflux disease)    Glaucoma    Hepatic steatosis    HH (hiatus hernia)    History of kidney stones    HLD (hyperlipidemia)    Hypertension    IDA (iron deficiency anemia)    Insomnia    a.) uses trazodone PRN   Internal hemorrhoids 12/25/2014  LAFB (left anterior fascicular block)    Long term current use of aspirin    Lumbar spinal stenosis    Migraines    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   a.) Bx 10/18/2020 - spindle cell neoplasm; b.) s/p partial mastectomy 12/06/2020 --> final pathology revealed estrogen receptor (+)  myofibroblastoma   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; well differentiated --> s/p bowel resection   Obesity    OSA (obstructive sleep apnea)    a.) non-compliant with prescribed nocturnal PAP therapy   Personal history of tobacco use, presenting hazards to health 06/18/2015   RLS (restless legs syndrome)    a.) on ropinirole   Status post carpal tunnel release    T2DM (type 2 diabetes mellitus) (HCC)    Tubular adenoma of colon    Past Surgical History:  Procedure Laterality Date   ABDOMINAL HYSTERECTOMY N/A 1982   APPENDECTOMY     BACK SURGERY  2002   CARPAL TUNNEL RELEASE     CATARACT EXTRACTION  2006   CHOLECYSTECTOMY     COLONOSCOPY WITH ESOPHAGOGASTRODUODENOSCOPY (EGD)     COLONOSCOPY WITH PROPOFOL N/A 08/03/2019   Procedure: COLONOSCOPY WITH PROPOFOL;  Surgeon: Toledo, Boykin Nearing, MD;  Location: [facility] ENDOSCOPY;  Service: Gastroenterology;  Laterality: N/A;   ESOPHAGOGASTRODUODENOSCOPY (EGD) WITH PROPOFOL N/A 02/16/2017   Procedure: ESOPHAGOGASTRODUODENOSCOPY (EGD) WITH PROPOFOL;  Surgeon: Scot Jun, MD;  Location: [facility] ENDOSCOPY;  Service: Endoscopy;  Laterality: N/A;   ESOPHAGOGASTRODUODENOSCOPY (EGD) WITH PROPOFOL N/A 08/03/2019   Procedure: ESOPHAGOGASTRODUODENOSCOPY (EGD) WITH PROPOFOL;  Surgeon: Toledo, Boykin Nearing, MD;  Location: [facility] ENDOSCOPY;  Service: Gastroenterology;  Laterality: N/A;   LUMBAR FUSION  2006   MASTECTOMY PARTIAL / LUMPECTOMY Right 12/06/2020   REPAIR ROTATOR CUFF TEAR     TOTAL HIP ARTHROPLASTY Left 06/04/2023   Procedure: TOTAL HIP ARTHROPLASTY ANTERIOR APPROACH;  Surgeon: Reinaldo Berber, MD;  Location: [facility] ORS;  Service: Orthopedics;  Laterality: Left;   TOTAL KNEE ARTHROPLASTY Bilateral 1999   UPPER ESOPHAGEAL ENDOSCOPIC ULTRASOUND (EUS) N/A 01/03/2016   Procedure: UPPER ESOPHAGEAL ENDOSCOPIC ULTRASOUND (EUS);  Surgeon: Bearl Mulberry, MD;  Location: [facility] ENDOSCOPY;  Service: Gastroenterology;   Laterality: N/A;   Social History:  reports that she has been smoking cigarettes. She has never used smokeless tobacco. She reports that she does not drink alcohol and does not use drugs.  No Known Allergies  Family History  Problem Relation Age of Onset   Diabetes Mother    Diabetes Sister     Prior to Admission medications   Medication Sig Start Date End Date Taking? Authorizing Provider  acetaminophen (TYLENOL) 500 MG tablet Take 500 mg by mouth every 6 (six) hours as needed for moderate pain.   Yes [provider]  Ascorbic Acid (VITAMIN C) 1000 MG tablet Take 1,000 mg by mouth every morning.   Yes [provider]  aspirin 81 MG tablet Take 81 mg by mouth daily.   Yes [provider]  atorvastatin (LIPITOR) 40 MG tablet Take 40 mg by mouth daily.   Yes [provider]  calcium carbonate (OSCAL) 1500 (600 Ca) MG TABS tablet Take 600 mg of elemental calcium by mouth daily with breakfast.   Yes [provider]  celecoxib (CELEBREX) 100 MG capsule Take 1 capsule (100 mg total) by mouth 2 (two) times daily for 10 days. 06/06/23 06/16/23 Yes Gaines, Thomas C, PA-C  COMBIVENT RESPIMAT 20-100 MCG/ACT AERS respimat USE 1 VIAL VIA NEBULIZER  4 TIMES A DAY AS NEEDED 12/13/18  Yes [provider]  DULoxetine (CYMBALTA) 30 MG capsule Take 30 mg by mouth every morning.   Yes [provider]  gabapentin (NEURONTIN) 300 MG capsule Take 300 mg by mouth 3 (three) times daily.   Yes [provider]  latanoprost (XALATAN) 0.005 % ophthalmic solution Place 1 drop into both eyes at bedtime.    Yes [provider]  olmesartan-hydrochlorothiazide (BENICAR HCT) 40-25 MG tablet Take 1 tablet by mouth daily.   Yes [provider]  Omega-3 Fatty Acids (FISH OIL) 1000 MG CAPS Take by mouth daily.   Yes [provider]  pantoprazole (PROTONIX) 40 MG tablet Take 1 tablet by mouth 2 (two) times daily.   Yes [provider]  traZODone (DESYREL) 50 MG tablet  07/18/20  Yes [provider]  vitamin B-12 1000 MCG tablet Take 1 tablet (1,000 mcg total) by mouth daily. 07/25/19  Yes Altamese Dilling, MD  docusate sodium (COLACE) 100 MG capsule Take 1 capsule (100 mg total) by mouth 2 (two) times daily. 06/06/23   Evon Slack, PA-C  enoxaparin (LOVENOX) 40 MG/0.4ML injection Inject 0.4 mLs (40 mg total) into the skin daily for 14 days. 06/06/23 06/20/23  Evon Slack, PA-C  insulin lispro (HUMALOG) 100 UNIT/ML KwikPen Inject 3 Units into the skin 3 (three) times daily.  08/31/18   [provider]  JARDIANCE 25 MG TABS tablet Take 25 mg by mouth daily.  03/25/18   [provider]  LEVEMIR FLEXTOUCH 100 UNIT/ML Pen Inject 25 Units into the skin daily. Patient reports she only takes Levemir if her glucose is >150 mg/dl which is rare for her 01/13/80   [provider]  metFORMIN (GLUCOPHAGE) 500 MG tablet Take 500 mg by mouth 2 (two) times daily with a meal.    [provider]  ondansetron (ZOFRAN) 4 MG tablet Take 1 tablet (4 mg total) by mouth every 6 (six) hours as needed for nausea. 06/06/23   Evon Slack, PA-C  rOPINIRole (REQUIP) 1 MG tablet TAKE 1 TABLET BY MOUTH EVERYDAY AT BEDTIME 11/11/18   [provider]  traMADol (ULTRAM) 50 MG tablet Take 1 tablet (50 mg total) by mouth every 6 (six) hours as needed for moderate pain. 06/06/23   Evon Slack, PA-C    Physical Exam: Vitals:   06/06/23 0755 06/06/23 1535 06/06/23 2326 06/07/23 0738  BP: 124/63 108/63 106/66 139/73  Pulse: 91 87 99 88  Resp: 16 16 20 18   Temp: 98.5 F (36.9 C) 98.2 F (36.8 C) 98.9 F (37.2 C) 98 F (36.7 C)  TempSrc: Oral Oral Oral Oral  SpO2: 94% 93% 94% 100%   Physical Exam Constitutional:      Appearance: She is obese.     Comments: + generalized lethargy   HENT:     Head: Normocephalic and atraumatic.     Nose: Nose normal.     Mouth/Throat:      Mouth: Mucous membranes are moist.  Cardiovascular:     Rate and Rhythm: Normal rate and regular rhythm.  Pulmonary:     Effort: Pulmonary effort is normal.  Abdominal:     General: Bowel sounds are normal.  Musculoskeletal:     Comments: + generalized weakness    Skin:    General: Skin is warm.  Neurological:     Comments: Grossly nonfocal neuro exam apart from ? Trace RUE weakness vs. Poor exam compliance  +  generalized weakness     Data Reviewed:   Results are pending, will review when available.    Family Communication: No family at the bedside  Primary team communication: Plan of care discussed w/ Ortho PA.  Thank you very much for involving [place] in the care of your patient.  Author: Floydene Flock, MD 06/07/2023 9:23 AM  For on call review [email or web address].

## 2023-06-07 NOTE — Progress Notes (Signed)
Physical Therapy Treatment Patient Details Name: Lynn Potter MRN: [id number] DOB: May 27, 1946 Today's Date: 06/07/2023   History of Present Illness 77 y/o female s/p L THA (anterior approach) on 06/04/23. PMH: asthma, T2DM, HTN, neuropathy, sleep apnea, glaucoma    PT Comments  Pt continues to struggle to make significant gains due to lethargy and pain.  She was able to do some supine exercises today but unable to engage against gravity or any real resistance, however with plenty of cuing and encouragement from PT and daughter pt did show some determination to get through most HEP exercises.  Pt remains very functionally limited and would have neither tolerated (pain/lethargy) nor been safe to attempt mobility this date.  Of note, mental status had improved from this morning. Continue with PT per total hip POC.     If plan is discharge home, recommend the following: A lot of help with walking and/or transfers;A lot of help with bathing/dressing/bathroom;Assistance with cooking/housework;Help with stairs or ramp for entrance;Assist for transportation   Can travel by private vehicle     No  Equipment Recommendations  None recommended by PT    Recommendations for Other Services       Precautions / Restrictions Precautions Precautions: Fall;Anterior Hip Restrictions LLE Weight Bearing: Weight bearing as tolerated     Mobility  Bed Mobility               General bed mobility comments: deferred, pt still lethargic, hyper sensitive to pain and refusing    Transfers                        Ambulation/Gait                   Stairs             Wheelchair Mobility     Tilt Bed    Modified Rankin (Stroke Patients Only)       Balance                                            Cognition Arousal: Lethargic Behavior During Therapy: Flat affect Overall Cognitive Status: Within Functional Limits for tasks assessed                                  General Comments:  (hesitant with all acts, daughter present and encouraging)        Exercises Total Joint Exercises Ankle Circles/Pumps: AROM, Both, 10 reps Quad Sets: 10 reps, Left, AAROM (stuggles to consistently get any quad contraction) Short Arc Quad: AAROM, 10 reps (pt only able to engage quad intermittently with heavy encouragement, cuing and AAROM) Heel Slides: PROM, AAROM, 10 reps (occasionally able to AROM leg extensions) Hip ABduction/ADduction: AAROM, 10 reps    General Comments        Pertinent Vitals/Pain Pain Assessment Pain Assessment: Faces Faces Pain Scale: Hurts whole lot Pain Location: L hip Pain Descriptors / Indicators:  (complains of exquisite pain with even the most guarded movement)    Home Living                          Prior Function            PT Goals (current goals  can now be found in the care plan section) Progress towards PT goals:  (pt continues to be very lethagic, poor activity tolerance)    Frequency    BID      PT Plan      Co-evaluation              AM-PAC PT "6 Clicks" Mobility   Outcome Measure  Help needed turning from your back to your side while in a flat bed without using bedrails?: A Lot Help needed moving from lying on your back to sitting on the side of a flat bed without using bedrails?: A Lot Help needed moving to and from a bed to a chair (including a wheelchair)?: Total Help needed standing up from a chair using your arms (e.g., wheelchair or bedside chair)?: Total Help needed to walk in hospital room?: Total Help needed climbing 3-5 steps with a railing? : Total 6 Click Score: 8    End of Session   Activity Tolerance: Patient limited by fatigue;Patient limited by pain;Other (comment) Patient left: in bed;with call bell/phone within reach;with nursing/sitter in room;with family/visitor present Nurse Communication: Mobility status PT Visit Diagnosis: Other  abnormalities of gait and mobility (R26.89);Muscle weakness (generalized) (M62.81);Pain Pain - Right/Left: Left Pain - part of body: Hip     Time: 1610-9604 PT Time Calculation (min) (ACUTE ONLY): 31 min  Charges:    $Therapeutic Exercise: 23-37 mins PT General Charges $$ ACUTE PT VISIT: 1 Visit                     Malachi Pro, DPT 06/07/2023, 5:22 PM

## 2023-06-07 NOTE — Progress Notes (Signed)
Pt re-assessed later in the afternoon  Daughter in the room  Mentation improved- responding to questioning More alert, though still mildly sleepy  MRI negative for CVA  Na uptrending  Will continue current plan for now  Follow closely

## 2023-06-07 NOTE — Progress Notes (Signed)
PT Cancellation Note  Patient Details Name: Lynn Potter MRN: [id number] DOB: Aug 07, 1946   Cancelled Treatment:    Reason Eval/Treat Not Completed: Medical issues which prohibited therapy Spoke with nurse who asks PT to hold this AM, "we are working her up for maybe a higher level of care."  RN to let PT know when/if appropriate to participate with therapy.  Will maintain on caseload and address as appropriate per POC and medical status.    Malachi Pro, DPT 06/07/2023, 10:19 AM

## 2023-06-07 NOTE — Plan of Care (Signed)
  Problem: Education: Goal: Ability to describe self-care measures that may prevent or decrease complications (Diabetes Survival Skills Education) will improve Outcome: Progressing Goal: Individualized Educational Video(s) Outcome: Progressing   Problem: Coping: Goal: Ability to adjust to condition or change in health will improve Outcome: Progressing   Problem: Fluid Volume: Goal: Ability to maintain a balanced intake and output will improve Outcome: Progressing   Problem: Health Behavior/Discharge Planning: Goal: Ability to identify and utilize available resources and services will improve Outcome: Progressing Goal: Ability to manage health-related needs will improve Outcome: Progressing   Problem: Metabolic: Goal: Ability to maintain appropriate glucose levels will improve Outcome: Progressing   Problem: Nutritional: Goal: Maintenance of adequate nutrition will improve Outcome: Progressing Goal: Progress toward achieving an optimal weight will improve Outcome: Progressing   Problem: Skin Integrity: Goal: Risk for impaired skin integrity will decrease Outcome: Progressing   Problem: Tissue Perfusion: Goal: Adequacy of tissue perfusion will improve Outcome: Progressing   Problem: Education: Goal: Knowledge of the prescribed therapeutic regimen will improve Outcome: Progressing Goal: Understanding of discharge needs will improve Outcome: Progressing Goal: Individualized Educational Video(s) Outcome: Progressing   Problem: Activity: Goal: Ability to avoid complications of mobility impairment will improve Outcome: Progressing Goal: Ability to tolerate increased activity will improve Outcome: Progressing   Problem: Clinical Measurements: Goal: Postoperative complications will be avoided or minimized Outcome: Progressing   Problem: Pain Management: Goal: Pain level will decrease with appropriate interventions Outcome: Progressing   Problem: Skin  Integrity: Goal: Will show signs of wound healing Outcome: Progressing   Problem: Education: Goal: Knowledge of the prescribed therapeutic regimen will improve Outcome: Progressing Goal: Understanding of discharge needs will improve Outcome: Progressing Goal: Individualized Educational Video(s) Outcome: Progressing   Problem: Activity: Goal: Ability to avoid complications of mobility impairment will improve Outcome: Progressing Goal: Ability to tolerate increased activity will improve Outcome: Progressing   Problem: Clinical Measurements: Goal: Postoperative complications will be avoided or minimized Outcome: Progressing   Problem: Pain Management: Goal: Pain level will decrease with appropriate interventions Outcome: Progressing   Problem: Skin Integrity: Goal: Will show signs of wound healing Outcome: Progressing   Problem: Education: Goal: Knowledge of General Education information will improve Description: Including pain rating scale, medication(s)/side effects and non-pharmacologic comfort measures Outcome: Progressing   Problem: Health Behavior/Discharge Planning: Goal: Ability to manage health-related needs will improve Outcome: Progressing   Problem: Clinical Measurements: Goal: Ability to maintain clinical measurements within normal limits will improve Outcome: Progressing Goal: Will remain free from infection Outcome: Progressing Goal: Diagnostic test results will improve Outcome: Progressing Goal: Respiratory complications will improve Outcome: Progressing Goal: Cardiovascular complication will be avoided Outcome: Progressing   Problem: Activity: Goal: Risk for activity intolerance will decrease Outcome: Progressing   Problem: Nutrition: Goal: Adequate nutrition will be maintained Outcome: Progressing   Problem: Coping: Goal: Level of anxiety will decrease Outcome: Progressing   Problem: Elimination: Goal: Will not experience complications  related to bowel motility Outcome: Progressing Goal: Will not experience complications related to urinary retention Outcome: Progressing   Problem: Pain Managment: Goal: General experience of comfort will improve Outcome: Progressing   Problem: Safety: Goal: Ability to remain free from injury will improve Outcome: Progressing   Problem: Skin Integrity: Goal: Risk for impaired skin integrity will decrease Outcome: Progressing

## 2023-06-07 NOTE — Assessment & Plan Note (Addendum)
Positive generalized lethargy x 1 to 2 days per nursing Noted to have received multiple doses of neurotropic/sedating medications including Neurontin, trazodone, Requip, Cymbalta while lethargic per report  Grossly non-focal neuro exam though w/ ? Trace RUE weakness vs. exam compliance  Chest x-ray stable apart from?  Bronchitis versus edema WBC WNL Urinalysis not indicative infection Will check MRI brain x 1  Suspect contribution of polypharmacy given overall presentation  Hold sedating medications  Noted Na 127- likely confounding issue  Check ammonia level  Check CK level  Neuro consult as clinically indicated

## 2023-06-07 NOTE — Progress Notes (Signed)
Subjective: 3 Days Post-Op Procedure(s) (LRB): TOTAL HIP ARTHROPLASTY ANTERIOR APPROACH (Left) Patient reports pain as mild.   Patient is well, and has had no acute complaints or problems Denies any CP, SOB, ABD pain. We will continue therapy today.  Slow progress with PT, needs max assist. Plan is to go Skilled nursing facility after hospital stay.  Objective: Vital signs in last 24 hours: Temp:  [98.2 F (36.8 C)-98.9 F (37.2 C)] 98.9 F (37.2 C) (08/31 2326) Pulse Rate:  [87-99] 99 (08/31 2326) Resp:  [16-20] 20 (08/31 2326) BP: (106-124)/(63-66) 106/66 (08/31 2326) SpO2:  [93 %-94 %] 94 % (08/31 2326)  Intake/Output from previous day: 08/31 0701 - 09/01 0700 In: 510 [P.O.:510] Out: 450 [Urine:450] Intake/Output this shift: No intake/output data recorded.  Recent Labs    06/05/23 0547 06/06/23 0442 06/07/23 0627  HGB 9.7* 10.0* 9.8*   Recent Labs    06/06/23 0442 06/07/23 0627  WBC 7.8 8.0  RBC 3.66* 3.60*  HCT 29.7* 29.4*  PLT 202 206   Recent Labs    06/05/23 0547  NA 134*  K 3.6  CL 95*  CO2 25  BUN 8  CREATININE 0.78  GLUCOSE 133*  CALCIUM 8.9   No results for input(s): "LABPT", "INR" in the last 72 hours.  EXAM General - Patient is Alert, Appropriate, and Oriented Extremity - Neurovascular intact Sensation intact distally Intact pulses distally Dorsiflexion/Plantar flexion intact Abdomen-soft nontender nondistended Dressing - dressing C/D/I and no drainage Motor Function - intact, moving foot and toes well on exam.   Past Medical History:  Diagnosis Date   AF (atrial fibrillation) (HCC)    a.) CHA2DS2-VASc: 52 (age x2, sex, HTN, vascular disease history, T2DM); b.) rate/rhythm maintained intrinsically without pharmacological intervention; no chronic oral anticoagulation   Aortic atherosclerosis (HCC)    Arthritis    Asthma    B12 deficiency    Bilateral carpal tunnel syndrome    CAD (coronary artery disease)    Cerebral  microvascular disease    Chicken pox    Clotting disorder (HCC)    COPD (chronic obstructive pulmonary disease) (HCC)    DDD (degenerative disc disease), lumbosacral    a.) s/p L4-S1 fusion in 2006   Depression    Diabetic peripheral neuropathy (HCC)    Diastolic dysfunction 06/06/2022   a.) TTE 06/06/2022: EF >55%, no RWMAs, mild LVH, triv MR/TR, mild AR, RVSP 20.5, G1DD   Diverticulosis    DOE (dyspnea on exertion)    GERD (gastroesophageal reflux disease)    Glaucoma    Hepatic steatosis    HH (hiatus hernia)    History of kidney stones    HLD (hyperlipidemia)    Hypertension    IDA (iron deficiency anemia)    Insomnia    a.) uses trazodone PRN   Internal hemorrhoids 12/25/2014   LAFB (left anterior fascicular block)    Long term current use of aspirin    Lumbar spinal stenosis    Migraines    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   a.) Bx 10/18/2020 - spindle cell neoplasm; b.) s/p partial mastectomy 12/06/2020 --> final pathology revealed estrogen receptor (+) myofibroblastoma   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; well differentiated --> s/p bowel resection   Obesity    OSA (obstructive sleep apnea)    a.) non-compliant with prescribed nocturnal PAP therapy   Personal history of tobacco use, presenting hazards to health 06/18/2015   RLS (restless legs  syndrome)    a.) on ropinirole   Status post carpal tunnel release    T2DM (type 2 diabetes mellitus) (HCC)    Tubular adenoma of colon     Assessment/Plan:   3 Days Post-Op Procedure(s) (LRB): TOTAL HIP ARTHROPLASTY ANTERIOR APPROACH (Left) Principal Problem:   Osteoarthritis of left hip  Estimated body mass index is 36.09 kg/m as calculated from the following:   Height as of 05/22/23: 5\' 1"  (1.549 m).   Weight as of 05/22/23: 86.6 kg. Advance diet Up with therapy Pain well-controlled Labs and vital signs are stable Care management to assist with discharge to skilled  nursing facility.  Stable for discharge to skilled nursing facility today pending authorization/availability.  Patient lives at home alone, slow progress with therapy.     DVT Prophylaxis - Lovenox, TED hose, and SCDs Weight-Bearing as tolerated to left leg   T. Cranston Neighbor, PA-C [facility] 06/07/2023, 7:07 AM

## 2023-06-08 DIAGNOSIS — M1612 Unilateral primary osteoarthritis, left hip: Secondary | ICD-10-CM

## 2023-06-08 LAB — CBC WITH DIFFERENTIAL/PLATELET
Abs Immature Granulocytes: 0.04 10*3/uL (ref 0.00–0.07)
Basophils Absolute: 0 10*3/uL (ref 0.0–0.1)
Basophils Relative: 0 %
Eosinophils Absolute: 0.1 10*3/uL (ref 0.0–0.5)
Eosinophils Relative: 1 %
HCT: 27.5 % — ABNORMAL LOW (ref 36.0–46.0)
Hemoglobin: 9.4 g/dL — ABNORMAL LOW (ref 12.0–15.0)
Immature Granulocytes: 1 %
Lymphocytes Relative: 20 %
Lymphs Abs: 1.5 10*3/uL (ref 0.7–4.0)
MCH: 27.6 pg (ref 26.0–34.0)
MCHC: 34.2 g/dL (ref 30.0–36.0)
MCV: 80.6 fL (ref 80.0–100.0)
Monocytes Absolute: 0.7 10*3/uL (ref 0.1–1.0)
Monocytes Relative: 9 %
Neutro Abs: 5.3 10*3/uL (ref 1.7–7.7)
Neutrophils Relative %: 69 %
Platelets: 220 10*3/uL (ref 150–400)
RBC: 3.41 MIL/uL — ABNORMAL LOW (ref 3.87–5.11)
RDW: 13.2 % (ref 11.5–15.5)
WBC: 7.6 10*3/uL (ref 4.0–10.5)
nRBC: 0 % (ref 0.0–0.2)

## 2023-06-08 LAB — GLUCOSE, CAPILLARY
Glucose-Capillary: 117 mg/dL — ABNORMAL HIGH (ref 70–99)
Glucose-Capillary: 104 mg/dL — ABNORMAL HIGH (ref 70–99)
Glucose-Capillary: 137 mg/dL — ABNORMAL HIGH (ref 70–99)
Glucose-Capillary: 130 mg/dL — ABNORMAL HIGH (ref 70–99)

## 2023-06-08 LAB — BASIC METABOLIC PANEL
Anion gap: 5 (ref 5–15)
BUN: 6 mg/dL — ABNORMAL LOW (ref 8–23)
CO2: 28 mmol/L (ref 22–32)
Calcium: 8.3 mg/dL — ABNORMAL LOW (ref 8.9–10.3)
Chloride: 100 mmol/L (ref 98–111)
Creatinine, Ser: 0.57 mg/dL (ref 0.44–1.00)
GFR, Estimated: >60 mL/min (ref >60)
Glucose, Bld: 105 mg/dL — ABNORMAL HIGH (ref 70–99)
Potassium: 3.7 mmol/L (ref 3.5–5.1)
Sodium: 133 mmol/L — ABNORMAL LOW (ref 135–145)

## 2023-06-08 MED ORDER — CELECOXIB 100 MG PO CAPS
100.0000 mg | ORAL_CAPSULE | Freq: Two times a day (BID) | ORAL | Status: DC
  Administered 2023-06-08: 100 mg via ORAL
  Filled 2023-06-08 (×3): qty 1

## 2023-06-08 MED ORDER — DIPHENHYDRAMINE HCL 25 MG PO CAPS
25.0000 mg | ORAL_CAPSULE | Freq: Four times a day (QID) | ORAL | Status: DC | PRN
  Administered 2023-06-08 – 2023-06-09 (×2): 25 mg via ORAL
  Filled 2023-06-08 (×2): qty 1

## 2023-06-08 MED ORDER — ENSURE ENLIVE PO LIQD
237.0000 mL | Freq: Two times a day (BID) | ORAL | Status: DC
  Administered 2023-06-08 – 2023-06-09 (×2): 237 mL via ORAL

## 2023-06-08 MED ORDER — SODIUM CHLORIDE 0.9 % IV SOLN: INTRAVENOUS | Status: AC

## 2023-06-08 MED ORDER — ACETAMINOPHEN 500 MG PO TABS
1000.0000 mg | ORAL_TABLET | Freq: Three times a day (TID) | ORAL | Status: DC
  Administered 2023-06-08 – 2023-06-09 (×3): 1000 mg via ORAL
  Filled 2023-06-08 (×3): qty 2

## 2023-06-08 NOTE — Plan of Care (Signed)
  Problem: Education: Goal: Ability to describe self-care measures that may prevent or decrease complications (Diabetes Survival Skills Education) will improve Outcome: Progressing Goal: Individualized Educational Video(s) Outcome: Progressing   Problem: Coping: Goal: Ability to adjust to condition or change in health will improve Outcome: Progressing   Problem: Fluid Volume: Goal: Ability to maintain a balanced intake and output will improve Outcome: Progressing   Problem: Health Behavior/Discharge Planning: Goal: Ability to identify and utilize available resources and services will improve Outcome: Progressing Goal: Ability to manage health-related needs will improve Outcome: Progressing   Problem: Metabolic: Goal: Ability to maintain appropriate glucose levels will improve Outcome: Progressing   Problem: Nutritional: Goal: Maintenance of adequate nutrition will improve Outcome: Progressing Goal: Progress toward achieving an optimal weight will improve Outcome: Progressing   Problem: Skin Integrity: Goal: Risk for impaired skin integrity will decrease Outcome: Progressing   Problem: Tissue Perfusion: Goal: Adequacy of tissue perfusion will improve Outcome: Progressing   Problem: Education: Goal: Knowledge of the prescribed therapeutic regimen will improve Outcome: Progressing Goal: Understanding of discharge needs will improve Outcome: Progressing Goal: Individualized Educational Video(s) Outcome: Progressing   Problem: Activity: Goal: Ability to avoid complications of mobility impairment will improve Outcome: Progressing Goal: Ability to tolerate increased activity will improve Outcome: Progressing   Problem: Clinical Measurements: Goal: Postoperative complications will be avoided or minimized Outcome: Progressing   Problem: Pain Management: Goal: Pain level will decrease with appropriate interventions Outcome: Progressing   Problem: Skin  Integrity: Goal: Will show signs of wound healing Outcome: Progressing   Problem: Education: Goal: Knowledge of the prescribed therapeutic regimen will improve Outcome: Progressing Goal: Understanding of discharge needs will improve Outcome: Progressing Goal: Individualized Educational Video(s) Outcome: Progressing   Problem: Activity: Goal: Ability to avoid complications of mobility impairment will improve Outcome: Progressing Goal: Ability to tolerate increased activity will improve Outcome: Progressing   Problem: Clinical Measurements: Goal: Postoperative complications will be avoided or minimized Outcome: Progressing   Problem: Pain Management: Goal: Pain level will decrease with appropriate interventions Outcome: Progressing   Problem: Skin Integrity: Goal: Will show signs of wound healing Outcome: Progressing   Problem: Education: Goal: Knowledge of General Education information will improve Description: Including pain rating scale, medication(s)/side effects and non-pharmacologic comfort measures Outcome: Progressing   Problem: Health Behavior/Discharge Planning: Goal: Ability to manage health-related needs will improve Outcome: Progressing   Problem: Clinical Measurements: Goal: Ability to maintain clinical measurements within normal limits will improve Outcome: Progressing Goal: Will remain free from infection Outcome: Progressing Goal: Diagnostic test results will improve Outcome: Progressing Goal: Respiratory complications will improve Outcome: Progressing Goal: Cardiovascular complication will be avoided Outcome: Progressing   Problem: Activity: Goal: Risk for activity intolerance will decrease Outcome: Progressing   Problem: Nutrition: Goal: Adequate nutrition will be maintained Outcome: Progressing   Problem: Coping: Goal: Level of anxiety will decrease Outcome: Progressing   Problem: Elimination: Goal: Will not experience complications  related to bowel motility Outcome: Progressing Goal: Will not experience complications related to urinary retention Outcome: Progressing   Problem: Pain Managment: Goal: General experience of comfort will improve Outcome: Progressing   Problem: Safety: Goal: Ability to remain free from injury will improve Outcome: Progressing   Problem: Skin Integrity: Goal: Risk for impaired skin integrity will decrease Outcome: Progressing

## 2023-06-08 NOTE — Progress Notes (Signed)
Physical Therapy Treatment Patient Details Name: Lynn Potter MRN: [id number] DOB: 1945/10/30 Today's Date: 06/08/2023   History of Present Illness 77 y/o female s/p L THA (anterior approach) on 06/04/23. PMH: asthma, T2DM, HTN, neuropathy, sleep apnea, glaucoma    PT Comments  Author was contacted by RN staff with pt requested to stand and return to bed form recliner. Pt tolerated sitting in recliner x several hours but endorsed increased pain. Pt was more alert this afternoon versus earlier in the day. She required +1 mod-max to achieve stranding but once in standing was able to demonstrate improve ability to clear RLE with LLE wt bearing. Pt does however continue to require increased time for all task due to pain. Once returned/repositioned in bed, reviewed HEP but pt endorses having just performed prior to authors arrival. Encouraged increased performance. She states understanding. DC recs remain appropriate.    If plan is discharge home, recommend the following: A lot of help with walking and/or transfers;A lot of help with bathing/dressing/bathroom;Assistance with cooking/housework;Help with stairs or ramp for entrance;Assist for transportation     Equipment Recommendations  Other (comment) (defer to next level of care)       Precautions / Restrictions Precautions Precautions: Fall;Anterior Hip Precaution Booklet Issued: Yes (comment) Restrictions Weight Bearing Restrictions: Yes LLE Weight Bearing: Weight bearing as tolerated     Mobility  Bed Mobility Overal bed mobility: Needs Assistance Bed Mobility: Sit to Supine  Supine to sit: Mod assist, Used rails Sit to supine: Mod assist, Used rails General bed mobility comments: p    Transfers Overall transfer level: Needs assistance Equipment used: Rolling walker (2 wheels) Transfers: Sit to/from Stand Sit to Stand: Mod assist, Max assist  General transfer comment: Pt was able to stand from recliner with +1 mod-max assist + vcs  for technique.    Ambulation/Gait Ambulation/Gait assistance: Min assist, Mod assist Gait Distance (Feet): 3 Feet Assistive device: Rolling walker (2 wheels) Gait Pattern/deviations: Step-to pattern, Antalgic, Trunk flexed Gait velocity: decreased  General Gait Details: Pt was able to advance to taking several steps from recliner back to EOB. she is limited by pain and continues to require extensive time to perform all task.   Balance Overall balance assessment: Needs assistance Sitting-balance support: Bilateral upper extremity supported, Feet supported Sitting balance-Leahy Scale: Fair Sitting balance - Comments: Heavy R lateral lean due to pain   Standing balance support: Bilateral upper extremity supported, Reliant on assistive device for balance Standing balance-Leahy Scale: Fair Standing balance comment: reliant on BUE support for all standing. limited standing activity performed.       Cognition Arousal: Alert Behavior During Therapy: WFL for tasks assessed/performed Overall Cognitive Status: Within Functional Limits for tasks assessed         General Comments General comments (skin integrity, edema, etc.): Reviewed HEP with pt." I just performed them before you came." I will do them again later. Pt endorses pain and fatigue after minimal activity.      Pertinent Vitals/Pain Pain Assessment Pain Assessment: 0-10 Pain Score: 6  Faces Pain Scale: Hurts whole lot Pain Location: L hip Pain Descriptors / Indicators: Grimacing, Guarding, Sore Pain Intervention(s): Limited activity within patient's tolerance, Monitored during session, Premedicated before session, Repositioned     PT Goals (current goals can now be found in the care plan section) Acute Rehab PT Goals Patient Stated Goal: none stated Progress towards PT goals: Progressing toward goals    Frequency    BID  AM-PAC PT "6 Clicks" Mobility   Outcome Measure  Help needed turning from your back  to your side while in a flat bed without using bedrails?: A Lot Help needed moving from lying on your back to sitting on the side of a flat bed without using bedrails?: A Lot Help needed moving to and from a bed to a chair (including a wheelchair)?: A Lot Help needed standing up from a chair using your arms (e.g., wheelchair or bedside chair)?: A Lot Help needed to walk in hospital room?: A Lot Help needed climbing 3-5 steps with a railing? : Total 6 Click Score: 11    End of Session   Activity Tolerance: Patient tolerated treatment well Patient left: in bed;with call bell/phone within reach;with bed alarm set;with family/visitor present Nurse Communication: Mobility status PT Visit Diagnosis: Other abnormalities of gait and mobility (R26.89);Muscle weakness (generalized) (M62.81);Pain Pain - Right/Left: Left Pain - part of body: Hip     Time: 4782-9562 PT Time Calculation (min) (ACUTE ONLY): 18 min  Charges:    $Therapeutic Activity: 8-22 mins PT General Charges $$ ACUTE PT VISIT: 1 Visit                     Jetta Lout PTA 06/08/23, 12:47 PM

## 2023-06-08 NOTE — Progress Notes (Signed)
Physical Therapy Treatment Patient Details Name: Lynn Potter MRN: [id number] DOB: October 10, 1945 Today's Date: 06/08/2023   History of Present Illness 77 y/o female s/p L THA (anterior approach) on 06/04/23. PMH: asthma, T2DM, HTN, neuropathy, sleep apnea, glaucoma    PT Comments  Pt was supine in bed, restless, upon arrival. She states she can't get comfortable and is c/o severe LBP/L hip pain. She presents with slow processing and slow response time to questions however does give coherent answers. Unable to correctly state orientation questions however pt does follow commands throughout session with increased time. Pt requires lots of time with all mobility, transfers, and limited gait. Pain limited overall. She was able to tolerate mobility to OOB, standing to RW, and then taking a few antalgic steps to recliner. +2 for transfers for pt/staff safety however pt demonstrated strength required to perform with +1 only. Acute PT will continue to follow and progress per current POC. DC recs remain appropriate. Author will return later this date for PM/BID session.   If plan is discharge home, recommend the following: A lot of help with walking and/or transfers;A lot of help with bathing/dressing/bathroom;Assistance with cooking/housework;Help with stairs or ramp for entrance;Assist for transportation     Equipment Recommendations  Other (comment) (Defer to next level of care)       Precautions / Restrictions Precautions Precautions: Fall;Anterior Hip Precaution Booklet Issued: Yes (comment) Restrictions Weight Bearing Restrictions: Yes LLE Weight Bearing: Weight bearing as tolerated     Mobility  Bed Mobility Overal bed mobility: Needs Assistance Bed Mobility: Supine to Sit  Supine to sit: Mod assist, Used rails  General bed mobility comments: Pt requires increased time and mod assist with LLE advancement. Pt likes to dictate progression due to pain and required constant encouragement     Transfers Overall transfer level: Needs assistance Equipment used: Rolling walker (2 wheels) Transfers: Sit to/from Stand Sit to Stand: +2 safety/equipment, From elevated surface, Min assist  General transfer comment: Pt was able to stand form elevated bed height but needed max encouragement and +2 assistance for safety. she demonstrates strength required but performed all task extremely slowly. R lateral lean while seated EOB for prolonged period.    Ambulation/Gait Ambulation/Gait assistance: Min assist, +2 safety/equipment Gait Distance (Feet): 2 Feet Assistive device: Rolling walker (2 wheels) Gait Pattern/deviations: Step-to pattern Gait velocity: decreased  General Gait Details: Pt did take 2 very slow max vcs steps to recliner. author assisted with RW progression. +2 for safety however only +1 really required.    Balance Overall balance assessment: Needs assistance Sitting-balance support: Bilateral upper extremity supported, Feet supported Sitting balance-Leahy Scale: Fair Sitting balance - Comments: Heavy R lateral lean due to pain   Standing balance support: Bilateral upper extremity supported, Reliant on assistive device for balance Standing balance-Leahy Scale: Fair Standing balance comment: reliant on BUE support for all standing. limited standing activity performed.      Cognition Arousal: Alert Behavior During Therapy: Flat affect Overall Cognitive Status: No family/caregiver present to determine baseline cognitive functioning        General Comments: Pt is awake but presents with flat affect and needs alot of time to respond. Pt unable to correctly state orientation questions but does follow commands with increased time. slow processing and response noted           General Comments General comments (skin integrity, edema, etc.): will review HEP in PM/BID session      Pertinent Vitals/Pain Pain Assessment Pain Assessment: Faces  Faces Pain Scale: Hurts  whole lot Pain Location: L hip Pain Descriptors / Indicators: Grimacing, Guarding, Sore Pain Intervention(s): Limited activity within patient's tolerance, Monitored during session, Premedicated before session, Repositioned     PT Goals (current goals can now be found in the care plan section) Acute Rehab PT Goals Patient Stated Goal: none stated Progress towards PT goals: Progressing toward goals    Frequency    BID       AM-PAC PT "6 Clicks" Mobility   Outcome Measure  Help needed turning from your back to your side while in a flat bed without using bedrails?: A Lot Help needed moving from lying on your back to sitting on the side of a flat bed without using bedrails?: A Lot Help needed moving to and from a bed to a chair (including a wheelchair)?: A Lot Help needed standing up from a chair using your arms (e.g., wheelchair or bedside chair)?: A Lot Help needed to walk in hospital room?: A Lot Help needed climbing 3-5 steps with a railing? : Total 6 Click Score: 11    End of Session   Activity Tolerance: Patient limited by pain Patient left: in chair;with call bell/phone within reach;with chair alarm set;with nursing/sitter in room Nurse Communication: Mobility status PT Visit Diagnosis: Other abnormalities of gait and mobility (R26.89);Muscle weakness (generalized) (M62.81);Pain Pain - Right/Left: Left Pain - part of body: Hip     Time: 4098-1191 PT Time Calculation (min) (ACUTE ONLY): 27 min  Charges:    $Therapeutic Activity: 23-37 mins PT General Charges $$ ACUTE PT VISIT: 1 Visit                     Jetta Lout PTA 06/08/23, 8:25 AM

## 2023-06-08 NOTE — Progress Notes (Signed)
Progress Note   Patient: Lynn Potter ZOX:[id number] DOB: [birth year] DOA: 06/04/2023     0 DOS: the patient was seen and examined on 06/08/2023   Brief hospital course:  Lynn Potter is a 77 y.o. female with past medical history of atrial fibrillation, asthma, CAD, HFpEF, GERD, hyperlipidemia obesity, OSA currently admitted on the orthopedic service for total hip arthroplasty on the left.  Postop day 3.  Was notified about patient have her lethargy.  Per nursing, patient with noted lethargy since yesterday.  No reported hemiparesis.  Has had worsening fatigue.  Per nursing, despite fatigue, patient has had multiple sedating medications including Lyrica, trazodone, Neurontin.  No complaints about chest pain shortness of breath nausea or vomiting.  Currently not on IV fluids.  No labs drawn over the past 24 hours per nursing. Currently afebrile, hemodynamically stable, satting well on room air.  A.m. chest x-ray with nonspecific changes with concern for possible edema versus atypical infection.?  Bronchitis.  White count 8, hemoglobin 9.8, platelets 206, creatinine 0.56.  Sodium 127, potassium 3.1.  Urinalysis not indicative of infection.   Review of Systems: As mentioned in the history of present illness. All other systems reviewed and are negative.    Assessment and Plan:  Hyponatremia Most likely related to hydrochlorothiazide use Serum sodium levels have improved Continue IV fluid hydration over the next 24 hours Patient is awake, alert and oriented to person and place but not to time.    Encephalopathy Improved Positive generalized lethargy x 1 to 2 days per nursing Noted to have received multiple doses of neurotropic/sedating medications including Neurontin, trazodone, Requip, Cymbalta  Patient is more awake and alert and is oriented to person and place but not to time..  Continue to hold neurotropic meds MRI of the brain without contrast shows chronic small vessel ischemia with  progression since a 2022 MRI in the left internal capsule and pons.    Diabetes mellitus Maintain consistent carbohydrate diet Continue sliding scale insulin    Hypertension Continue Avapro   Coronary artery disease Continue aspirin and statins    Severe left hip osteoarthritis Status post left total hip arthroplasty Appreciate PT input They recommend skilled nursing facility upon discharge for subacute rehab.    Obesity Complicates overall prognosis and care         Subjective: Patient is seen and examined at the bedside.  Sitting up in a chair and appears comfortable and in no distress.  Physical Exam: Vitals:   06/07/23 0738 06/07/23 1742 06/07/23 2356 06/08/23 0748  BP: 139/73 (!) 146/72 (!) 154/67 (!) 164/94  Pulse: 88 94 96 86  Resp: 18 16 16 17   Temp: 98 F (36.7 C) 99.5 F (37.5 C) 99 F (37.2 C) 98.4 F (36.9 C)  TempSrc: Oral     SpO2: 100% 100% 99% 100%     Appearance: She is obese.     Comments:    HENT:     Head: Normocephalic and atraumatic.     Nose: Nose normal.     Mouth/Throat:     Mouth: Mucous membranes are moist.  Cardiovascular:     Rate and Rhythm: Normal rate and regular rhythm.  Pulmonary:     Effort: Pulmonary effort is normal.  Abdominal:     General: Bowel sounds are normal.  Musculoskeletal:     Comments: + generalized weakness    Skin:    General: Skin is warm.  Neurological:     Comments: Able to move  all extremities.  Decreased range of motion left hip, generalized weakness      Data Reviewed: Labs reviewed.  Sodium 133 There are no new results to review at this time.  Family Communication: No family at the bedside during my exam  Disposition: Status is: Observation The patient remains OBS appropriate and will d/c before 2 midnights.  Planned Discharge Destination: Skilled nursing facility    Time spent: 33 minutes  Author: Lucile Shutters, MD 06/08/2023 12:32 PM  For on call review  [email or web address].

## 2023-06-08 NOTE — Plan of Care (Signed)
  Problem: Education: Goal: Knowledge of General Education information will improve Description: Including pain rating scale, medication(s)/side effects and non-pharmacologic comfort measures Outcome: Progressing   Problem: Activity: Goal: Risk for activity intolerance will decrease Outcome: Progressing   Problem: Nutrition: Goal: Adequate nutrition will be maintained Outcome: Progressing   Problem: Elimination: Goal: Will not experience complications related to bowel motility Outcome: Progressing Goal: Will not experience complications related to urinary retention Outcome: Progressing   Problem: Safety: Goal: Ability to remain free from injury will improve Outcome: Progressing   

## 2023-06-08 NOTE — TOC Progression Note (Signed)
Transition of Care [facility]) - Progression Note    Patient Details  Name: ORLY PEZZINO MRN: [id number] Date of Birth: 10-18-1945  [facility] CM/SW Contact  Marlowe Sax, RN Phone Number: 06/08/2023, 10:49 AM  Clinical Narrative:    Spoke with the patient and reviewed the bed offers, she chose [facility], I notified Tam[facility] at [facility], Ins auth Pending   Expected Discharge Plan: Skilled Nursing Facility Barriers to Discharge: SNF Pending bed offer, Insurance Authorization  Expected Discharge Plan and Services   Discharge Planning Services: CM Consult   Living arrangements for the past 2 months: Apartment                                       Social Determinants of Health (SDOH) Interventions SDOH Screenings   Food Insecurity: No Food Insecurity (06/04/2023)  Housing: Low Risk  (06/04/2023)  Transportation Needs: No Transportation Needs (06/04/2023)  Utilities: Not At Risk (06/04/2023)  Depression (PHQ2-9): Low Risk  (07/17/2020)  Tobacco Use: High Risk (06/04/2023)    Readmission Risk Interventions     No data to display

## 2023-06-08 NOTE — Progress Notes (Signed)
Subjective: 4 Days Post-Op Procedure(s) (LRB): TOTAL HIP ARTHROPLASTY ANTERIOR APPROACH (Left) Patient reports pain as moderate. She is sitting up in chair and alert. Answers most questions appropriately. States she is seeing bugs/spiders on the floor. Patient is well, and has had no acute complaints or problems Denies any CP, SOB, ABD pain. + BM We will continue therapy today.  Slow progress with PT Plan is to go Skilled nursing facility after hospital stay.  Objective: Vital signs in last 24 hours: Temp:  [98.4 F (36.9 C)-99.5 F (37.5 C)] 98.4 F (36.9 C) (09/02 0748) Pulse Rate:  [86-96] 86 (09/02 0748) Resp:  [16-17] 17 (09/02 0748) BP: (146-164)/(67-94) 164/94 (09/02 0748) SpO2:  [99 %-100 %] 100 % (09/02 0748)  Intake/Output from previous day: 09/01 0701 - 09/02 0700 In: 240.5 [P.O.:240; I.V.:0.5] Out: 1100 [Urine:1100] Intake/Output this shift: Total I/O In: 120 [P.O.:120] Out: -   Recent Labs    06/06/23 0442 06/07/23 0627 06/08/23 0408  HGB 10.0* 9.8* 9.4*   Recent Labs    06/07/23 0627 06/08/23 0408  WBC 8.0 7.6  RBC 3.60* 3.41*  HCT 29.4* 27.5*  PLT 206 220   Recent Labs    06/07/23 0627 06/07/23 1051 06/07/23 2121 06/08/23 0408  NA 127*   < > 131* 133*  K 3.1*  --   --  3.7  CL 91*  --   --  100  CO2 27  --   --  28  BUN 8  --   --  6*  CREATININE 0.56  --   --  0.57  GLUCOSE 128*  --   --  105*  CALCIUM 8.5*  --   --  8.3*   < > = values in this interval not displayed.   No results for input(s): "LABPT", "INR" in the last 72 hours.  EXAM General - Patient is Alert, Appropriate, and Oriented, reports visual hallucinations Neuro - CN II-VII intact Extremity - Neurovascular intact Sensation intact distally Intact pulses distally Dorsiflexion/Plantar flexion intact Abdomen-soft nontender nondistended Dressing - dressing C/D/I and no drainage Motor Function - intact, moving foot and toes well on exam.   Past Medical History:   Diagnosis Date   AF (atrial fibrillation) (HCC)    a.) CHA2DS2-VASc: 56 (age x2, sex, HTN, vascular disease history, T2DM); b.) rate/rhythm maintained intrinsically without pharmacological intervention; no chronic oral anticoagulation   Aortic atherosclerosis (HCC)    Arthritis    Asthma    B12 deficiency    Bilateral carpal tunnel syndrome    CAD (coronary artery disease)    Cerebral microvascular disease    Chicken pox    Clotting disorder (HCC)    COPD (chronic obstructive pulmonary disease) (HCC)    DDD (degenerative disc disease), lumbosacral    a.) s/p L4-S1 fusion in 2006   Depression    Diabetic peripheral neuropathy (HCC)    Diastolic dysfunction 06/06/2022   a.) TTE 06/06/2022: EF >55%, no RWMAs, mild LVH, triv MR/TR, mild AR, RVSP 20.5, G1DD   Diverticulosis    DOE (dyspnea on exertion)    GERD (gastroesophageal reflux disease)    Glaucoma    Hepatic steatosis    HH (hiatus hernia)    History of kidney stones    HLD (hyperlipidemia)    Hypertension    IDA (iron deficiency anemia)    Insomnia    a.) uses trazodone PRN   Internal hemorrhoids 12/25/2014   LAFB (left anterior fascicular block)    Long  term current use of aspirin    Lumbar spinal stenosis    Migraines    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   a.) Bx 10/18/2020 - spindle cell neoplasm; b.) s/p partial mastectomy 12/06/2020 --> final pathology revealed estrogen receptor (+) myofibroblastoma   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; well differentiated --> s/p bowel resection   Obesity    OSA (obstructive sleep apnea)    a.) non-compliant with prescribed nocturnal PAP therapy   Personal history of tobacco use, presenting hazards to health 06/18/2015   RLS (restless legs syndrome)    a.) on ropinirole   Status post carpal tunnel release    T2DM (type 2 diabetes mellitus) (HCC)    Tubular adenoma of colon     Assessment/Plan:   4 Days Post-Op Procedure(s)  (LRB): TOTAL HIP ARTHROPLASTY ANTERIOR APPROACH (Left) Principal Problem:   Osteoarthritis of left hip Active Problems:   Encephalopathy   Hyponatremia   Atrial fibrillation, chronic (HCC)   Hyperlipidemia  Estimated body mass index is 36.09 kg/m as calculated from the following:   Height as of 05/22/23: 5\' 1"  (1.549 m).   Weight as of 05/22/23: 86.6 kg. Advance diet Up with therapy  Pain moderate -. Patient appears comfortable. dc tramadol due to visual hallucinations. continue with scheduled tylenol and celebrex only  Vital signs are stable  Labs are stable - hyponatremia and hypokalemia resolved  + BM  Altered mental status - - CT/MRI head. Mental status appears well this am with no neuro deficits. She is more lucid this morning than last 2 days but does report visual hallucinations. Will dc tramadol. Try to keep awake during the day. Blinds are opened in the room to help. Continue to monitor for improvement.  Care management to assist with discharge to skilled nursing facility tomorrow   DVT Prophylaxis - Lovenox, TED hose, and SCDs Weight-Bearing as tolerated to left leg   T. Cranston Neighbor, PA-C [facility] 06/08/2023, 10:30 AM

## 2023-06-09 DIAGNOSIS — E871 Hypo-osmolality and hyponatremia: Secondary | ICD-10-CM | POA: Diagnosis not present

## 2023-06-09 DIAGNOSIS — M1612 Unilateral primary osteoarthritis, left hip: Secondary | ICD-10-CM | POA: Diagnosis not present

## 2023-06-09 LAB — BASIC METABOLIC PANEL
Anion gap: 10 (ref 5–15)
BUN: 7 mg/dL — ABNORMAL LOW (ref 8–23)
CO2: 26 mmol/L (ref 22–32)
Calcium: 8.8 mg/dL — ABNORMAL LOW (ref 8.9–10.3)
Chloride: 98 mmol/L (ref 98–111)
Creatinine, Ser: 0.49 mg/dL (ref 0.44–1.00)
GFR, Estimated: >60 mL/min (ref >60)
Glucose, Bld: 124 mg/dL — ABNORMAL HIGH (ref 70–99)
Potassium: 3.6 mmol/L (ref 3.5–5.1)
Sodium: 134 mmol/L — ABNORMAL LOW (ref 135–145)

## 2023-06-09 LAB — CBC
HCT: 31.3 % — ABNORMAL LOW (ref 36.0–46.0)
Hemoglobin: 10.2 g/dL — ABNORMAL LOW (ref 12.0–15.0)
MCH: 27.1 pg (ref 26.0–34.0)
MCHC: 32.6 g/dL (ref 30.0–36.0)
MCV: 83 fL (ref 80.0–100.0)
Platelets: 272 10*3/uL (ref 150–400)
RBC: 3.77 MIL/uL — ABNORMAL LOW (ref 3.87–5.11)
RDW: 13.2 % (ref 11.5–15.5)
WBC: 7.2 10*3/uL (ref 4.0–10.5)
nRBC: 0.3 % — ABNORMAL HIGH (ref 0.0–0.2)

## 2023-06-09 LAB — CK: Total CK: 170 U/L (ref 38–234)

## 2023-06-09 LAB — GLUCOSE, CAPILLARY
Glucose-Capillary: 105 mg/dL — ABNORMAL HIGH (ref 70–99)
Glucose-Capillary: 132 mg/dL — ABNORMAL HIGH (ref 70–99)

## 2023-06-09 MED ORDER — DIPHENHYDRAMINE HCL 25 MG PO CAPS: 25.0000 mg | ORAL_CAPSULE | Freq: Four times a day (QID) | ORAL | Status: AC | PRN

## 2023-06-09 MED ORDER — FAMOTIDINE 20 MG PO TABS
20.0000 mg | ORAL_TABLET | Freq: Two times a day (BID) | ORAL | Status: DC
  Filled 2023-06-09: qty 1

## 2023-06-09 MED ORDER — ENOXAPARIN SODIUM 40 MG/0.4ML IJ SOSY: 40.0000 mg | PREFILLED_SYRINGE | INTRAMUSCULAR | Status: DC

## 2023-06-09 MED ORDER — PANTOPRAZOLE SODIUM 20 MG PO TBEC: 20.0000 mg | DELAYED_RELEASE_TABLET | Freq: Every day | ORAL | Status: DC

## 2023-06-09 MED ORDER — ACETAMINOPHEN 500 MG PO TABS: 1000.0000 mg | ORAL_TABLET | Freq: Three times a day (TID) | ORAL | 0 refills | Status: AC

## 2023-06-09 MED ORDER — ENSURE ENLIVE PO LIQD: 237.0000 mL | Freq: Two times a day (BID) | ORAL | 12 refills | Status: AC

## 2023-06-09 NOTE — TOC Transition Note (Signed)
Transition of Care [facility]) - CM/SW Discharge Note   Patient Details  Name: Lynn Potter MRN: [id number] Date of Birth: Apr 08, 1946  Transition of [facility]) CM/SW Contact:  Garret Reddish, RN Phone Number: 06/09/2023, 2:42 PM   Clinical Narrative:     Chart reviewed. Received SNF authorization approval.  Plan Berkley Harvey ID is [id number] Auth [id number].  Approved from 06-09-23- 06-11-23.  Next review date is 06-11-23.   I have spoken with Tammy with [facility] and she informs me that patient is able to come to the facility today.  Tammy reports that the number to call report is [phone] and patient will be going to room 808.  I have informed patient that she will have a room today at [facility].  Patient informs me that she will make her family aware.  I have informed her that [facility] will transport patient to the facility today.  I have arranged EMS transport to [facility] today by [facility].    I have made staff nurse aware of the above information.     Final next level of care: Skilled Nursing Facility Barriers to Discharge: No Barriers Identified   Patient Goals and CMS Choice CMS Medicare.gov Compare Post Acute Care list provided to:: Patient Choice offered to / list presented to : Patient  Discharge Placement                Patient chooses bed at: [facility] [place] Patient to be transferred to facility by: [facility] Name of family member notified: Patient reports that she has informed her family. Patient and family notified of of transfer: 06/09/23  Discharge Plan and Services Additional resources added to the After Visit Summary for     Discharge Planning Services: CM Consult                                 Social Determinants of Health (SDOH) Interventions SDOH Screenings   Food Insecurity: No Food Insecurity (06/04/2023)  Housing: Low Risk  (06/04/2023)  Transportation Needs: No Transportation Needs (06/04/2023)   Utilities: Not At Risk (06/04/2023)  Depression (PHQ2-9): Low Risk  (07/17/2020)  Tobacco Use: High Risk (06/04/2023)     Readmission Risk Interventions     No data to display

## 2023-06-09 NOTE — Progress Notes (Signed)
Subjective: 5 Days Post-Op Procedure(s) (LRB): TOTAL HIP ARTHROPLASTY ANTERIOR APPROACH (Left) Patient states she is doing well this morning.  Her pain is well-controlled.  Denies any visual hallucinations.  She seems to be much more alert this morning and back to baseline. Patient states she feels itchy. No rash. Has taken benadryl with mild relief. No facial swelling, wheezing or difficulty swallowing Denies any CP, SOB, ABD pain. + BM We will continue therapy today.  Slow progress with PT Plan is to go Skilled nursing facility after hospital stay.  Objective: Vital signs in last 24 hours: Temp:  [97.7 F (36.5 C)-98.5 F (36.9 C)] 98.5 F (36.9 C) (09/02 2329) Pulse Rate:  [63-86] 63 (09/02 2349) Resp:  [17-18] 18 (09/02 2329) BP: (136-184)/(70-94) 156/76 (09/02 2349) SpO2:  [100 %] 100 % (09/02 2329)  Intake/Output from previous day: 09/02 0701 - 09/03 0700 In: 416.9 [P.O.:240; I.V.:176.9] Out: -  Intake/Output this shift: No intake/output data recorded.  Recent Labs    06/07/23 0627 06/08/23 0408  HGB 9.8* 9.4*   Recent Labs    06/07/23 0627 06/08/23 0408  WBC 8.0 7.6  RBC 3.60* 3.41*  HCT 29.4* 27.5*  PLT 206 220   Recent Labs    06/07/23 0627 06/07/23 1051 06/07/23 2121 06/08/23 0408  NA 127*   < > 131* 133*  K 3.1*  --   --  3.7  CL 91*  --   --  100  CO2 27  --   --  28  BUN 8  --   --  6*  CREATININE 0.56  --   --  0.57  GLUCOSE 128*  --   --  105*  CALCIUM 8.5*  --   --  8.3*   < > = values in this interval not displayed.   No results for input(s): "LABPT", "INR" in the last 72 hours.  EXAM General - Patient is Alert, Appropriate, and Oriented Neuro - CN II-VII intact HEENT -no facial swelling/tongue swelling Pulmonary -lungs are clear to auscultation with no wheezing rales or rhonchi.  Good breath sounds bilaterally Extremity - Neurovascular intact Sensation intact distally Intact pulses distally Dorsiflexion/Plantar flexion  intact Abdomen-soft nontender nondistended Skin: No visible rash, redness Dressing - dressing C/D/I and no drainage Motor Function - intact, moving foot and toes well on exam.   Past Medical History:  Diagnosis Date   AF (atrial fibrillation) (HCC)    a.) CHA2DS2-VASc: 61 (age x2, sex, HTN, vascular disease history, T2DM); b.) rate/rhythm maintained intrinsically without pharmacological intervention; no chronic oral anticoagulation   Aortic atherosclerosis (HCC)    Arthritis    Asthma    B12 deficiency    Bilateral carpal tunnel syndrome    CAD (coronary artery disease)    Cerebral microvascular disease    Chicken pox    Clotting disorder (HCC)    COPD (chronic obstructive pulmonary disease) (HCC)    DDD (degenerative disc disease), lumbosacral    a.) s/p L4-S1 fusion in 2006   Depression    Diabetic peripheral neuropathy (HCC)    Diastolic dysfunction 06/06/2022   a.) TTE 06/06/2022: EF >55%, no RWMAs, mild LVH, triv MR/TR, mild AR, RVSP 20.5, G1DD   Diverticulosis    DOE (dyspnea on exertion)    GERD (gastroesophageal reflux disease)    Glaucoma    Hepatic steatosis    HH (hiatus hernia)    History of kidney stones    HLD (hyperlipidemia)    Hypertension  IDA (iron deficiency anemia)    Insomnia    a.) uses trazodone PRN   Internal hemorrhoids 12/25/2014   LAFB (left anterior fascicular block)    Long term current use of aspirin    Lumbar spinal stenosis    Migraines    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   a.) Bx 10/18/2020 - spindle cell neoplasm; b.) s/p partial mastectomy 12/06/2020 --> final pathology revealed estrogen receptor (+) myofibroblastoma   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; well differentiated --> s/p bowel resection   Obesity    OSA (obstructive sleep apnea)    a.) non-compliant with prescribed nocturnal PAP therapy   Personal history of tobacco use, presenting hazards to health 06/18/2015   RLS (restless  legs syndrome)    a.) on ropinirole   Status post carpal tunnel release    T2DM (type 2 diabetes mellitus) (HCC)    Tubular adenoma of colon     Assessment/Plan:   5 Days Post-Op Procedure(s) (LRB): TOTAL HIP ARTHROPLASTY ANTERIOR APPROACH (Left) Principal Problem:   Osteoarthritis of left hip Active Problems:   Encephalopathy   Hyponatremia   Atrial fibrillation, chronic (HCC)   Hyperlipidemia  Estimated body mass index is 36.09 kg/m as calculated from the following:   Height as of 05/22/23: 5\' 1"  (1.549 m).   Weight as of 05/22/23: 86.6 kg. Advance diet Up with therapy  Pain well-controlled.  Continue with Tylenol and Celebrex.  Vital signs are stable  Altered mental status -resolved.  Patient back to baseline.  Negative workup 2 days ago  Pruritus -continue with as needed Benadryl and famotidine.  Does not appear to be having any type of allergic reaction/angioedema  Care management to assist with discharge to skilled nursing facility today   DVT Prophylaxis - Lovenox, TED hose, and SCDs Weight-Bearing as tolerated to left leg   T. Cranston Neighbor, PA-C [facility] 06/09/2023, 7:43 AM

## 2023-06-09 NOTE — Progress Notes (Signed)
Physical Therapy Treatment Patient Details Name: Lynn Potter MRN: [id number] DOB: [birth year] Today's Date: 06/09/2023   History of Present Illness 77 y/o female s/p L THA (anterior approach) on 06/04/23. PMH: asthma, T2DM, HTN, neuropathy, sleep apnea, glaucoma    PT Comments  Walking by room, pt asking for help to get rail down so she can get to bathroom.  Pt is assisted to left side of bed with min a x 1 and is able to transfer with min a x 1 to BSC to void.  She attends to her own needs and is able to make a small lap -8' in room before limited by fatigue and opts to stay in chair.  Supine ex in chair AAROM x 10.  Pt had poor awareness of limitations initially and education that she needs +1 assist and would have struggled with walk to bathroom at this time.  Overall she is progressing towards goals.  Itching remains for pt and MD aware.  Will benefit from continued therapies at discharge.   If plan is discharge home, recommend the following: A lot of help with walking and/or transfers;Assistance with cooking/housework;Help with stairs or ramp for entrance;Assist for transportation;A little help with walking and/or transfers;A little help with bathing/dressing/bathroom   Can travel by private vehicle        Equipment Recommendations       Recommendations for Other Services       Precautions / Restrictions Precautions Precautions: Fall;Anterior Hip Precaution Booklet Issued: Yes (comment) Restrictions Weight Bearing Restrictions: Yes LLE Weight Bearing: Weight bearing as tolerated     Mobility  Bed Mobility Overal bed mobility: Needs Assistance Bed Mobility: Sit to Supine     Supine to sit: Min assist       Patient Response: Cooperative, Impulsive  Transfers Overall transfer level: Needs assistance Equipment used: Rolling walker (2 wheels) Transfers: Sit to/from Stand Sit to Stand: Min assist                Ambulation/Gait Ambulation/Gait assistance: Armed forces technical officer (Feet): 8 Feet Assistive device: Rolling walker (2 wheels)   Gait velocity: decreased     General Gait Details: transfers to [facility] then small lap in room before fatigue and sits in chair   Stairs             Wheelchair Mobility     Tilt Bed Tilt Bed Patient Response: Cooperative, Impulsive  Modified Rankin (Stroke Patients Only)       Balance Overall balance assessment: Needs assistance Sitting-balance support: Bilateral upper extremity supported, Feet supported Sitting balance-Leahy Scale: Good     Standing balance support: Bilateral upper extremity supported, Reliant on assistive device for balance Standing balance-Leahy Scale: Fair Standing balance comment: reliant on BUE support for all standing. limited standing activity performed.                            Cognition Arousal: Alert Behavior During Therapy: WFL for tasks assessed/performed Overall Cognitive Status: Within Functional Limits for tasks assessed                                          Exercises Other Exercises Other Exercises: BSC to void Other Exercises: supine ex in chair with AAROM x 10    General Comments        Pertinent Vitals/Pain  Pain Assessment Pain Assessment: Faces Faces Pain Scale: Hurts little more Pain Location: L hip Pain Descriptors / Indicators: Grimacing, Guarding, Sore Pain Intervention(s): Limited activity within patient's tolerance, Monitored during session, Repositioned, Premedicated before session    Home Living                          Prior Function            PT Goals (current goals can now be found in the care plan section) Progress towards PT goals: Progressing toward goals    Frequency    BID      PT Plan      Co-evaluation              AM-PAC PT "6 Clicks" Mobility   Outcome Measure  Help needed turning from your back to your side while in a flat bed without using  bedrails?: A Little Help needed moving from lying on your back to sitting on the side of a flat bed without using bedrails?: A Little Help needed moving to and from a bed to a chair (including a wheelchair)?: A Little Help needed standing up from a chair using your arms (e.g., wheelchair or bedside chair)?: A Little Help needed to walk in hospital room?: A Little Help needed climbing 3-5 steps with a railing? : A Lot 6 Click Score: 17    End of Session Equipment Utilized During Treatment: Gait belt Activity Tolerance: Patient tolerated treatment well Patient left: in chair;with call bell/phone within reach;with chair alarm set;with family/visitor present Nurse Communication: Mobility status PT Visit Diagnosis: Other abnormalities of gait and mobility (R26.89);Muscle weakness (generalized) (M62.81);Pain Pain - Right/Left: Left Pain - part of body: Hip     Time: 9604-5409 PT Time Calculation (min) (ACUTE ONLY): 20 min  Charges:    $Gait Training: 8-22 mins PT General Charges $$ ACUTE PT VISIT: 1 Visit                     Danielle Dess, PTA 06/09/23, 11:14 AM

## 2023-06-09 NOTE — Progress Notes (Signed)
Contacted and gave report to Nurse Selena Batten at [facility] . Nurse Selena Batten acknowledged understanding. Patient is waiting to be transported by EMS.

## 2023-06-09 NOTE — Plan of Care (Signed)

## 2023-06-09 NOTE — Progress Notes (Signed)
Progress Note   Patient: Lynn Potter YQM:[id number] DOB: 09/16/1946 DOA: 06/04/2023     0 DOS: the patient was seen and examined on 06/09/2023   Brief hospital course:  Lynn Potter is a 77 y.o. female with past medical history of atrial fibrillation, asthma, CAD, HFpEF, GERD, hyperlipidemia obesity, OSA currently admitted on the orthopedic service for total hip arthroplasty on the left.  Postop day 3.  Was notified about patient have her lethargy.  Per nursing, patient with noted lethargy since yesterday.  No reported hemiparesis.  Has had worsening fatigue.  Per nursing, despite fatigue, patient has had multiple sedating medications including Lyrica, trazodone, Neurontin.  No complaints about chest pain shortness of breath nausea or vomiting.  Currently not on IV fluids.  No labs drawn over the past 24 hours per nursing. Currently afebrile, hemodynamically stable, satting well on room air.  A.m. chest x-ray with nonspecific changes with concern for possible edema versus atypical infection.?  Bronchitis.  White count 8, hemoglobin 9.8, platelets 206, creatinine 0.56.  Sodium 127, potassium 3.1.  Urinalysis not indicative of infection.   Review of Systems: As mentioned in the history of present illness. All other systems reviewed and are negative.     Assessment and Plan:  Hyponatremia Most likely related to hydrochlorothiazide use. Hydrochlorothiazide has been discontinued Serum sodium levels have improved      Encephalopathy Improved Positive generalized lethargy x 1 to 2 days per nursing Noted to have received multiple doses of neurotropic/sedating medications including Neurontin, trazodone, Requip, Cymbalta  Patient is more awake and alert and is oriented x 3.  MRI of the brain without contrast shows chronic small vessel ischemia with progression since a 2022 MRI in the left internal capsule and pons.       Diabetes mellitus Maintain consistent carbohydrate diet Continue  sliding scale insulin       Hypertension Continue Avapro     Coronary artery disease Continue aspirin and statins       Severe left hip osteoarthritis Status post left total hip arthroplasty Appreciate PT input They recommend skilled nursing facility upon discharge for subacute rehab.       Obesity Complicates overall prognosis and care         Subjective: Patient is seen and examined at the bedside.  No new complaints  Physical Exam: Vitals:   06/08/23 1705 06/08/23 2329 06/08/23 2349 06/09/23 0856  BP: 136/70 (!) 184/91 (!) 156/76 (!) 167/86  Pulse: 63 68 63 82  Resp: 17 18  18   Temp: 97.7 F (36.5 C) 98.5 F (36.9 C)  98 F (36.7 C)  TempSrc: Oral     SpO2: 100% 100%  100%    Appearance: She is obese.     Comments:    HENT:     Head: Normocephalic and atraumatic.     Nose: Nose normal.     Mouth/Throat:     Mouth: Mucous membranes are moist.  Cardiovascular:     Rate and Rhythm: Normal rate and regular rhythm.  Pulmonary:     Effort: Pulmonary effort is normal.  Abdominal:     General: Bowel sounds are normal.  Musculoskeletal:     Comments: + generalized weakness    Skin:    General: Skin is warm.  Neurological:     Comments: Able to move all extremities.  Decreased range of motion left hip, generalized weakness      Data Reviewed: Labs reviewed.  Sodium 134, glucose 124, hemoglobin  10.2 There are no new results to review at this time.  Family Communication: None primary cancer  Disposition: Status is: Observation The patient remains OBS appropriate and will d/c before 2 midnights.  Planned Discharge Destination: Skilled nursing facility    Time spent: 33  minutes  Author: Lucile Shutters, MD 06/09/2023 11:46 AM  For on call review [email or web address].

## 2023-07-24 ENCOUNTER — Encounter: Payer: Self-pay | Admitting: Podiatry

## 2023-07-24 ENCOUNTER — Ambulatory Visit (INDEPENDENT_AMBULATORY_CARE_PROVIDER_SITE_OTHER): Payer: 59 | Admitting: Podiatry

## 2023-07-24 DIAGNOSIS — M79674 Pain in right toe(s): Secondary | ICD-10-CM

## 2023-07-24 DIAGNOSIS — M79675 Pain in left toe(s): Secondary | ICD-10-CM | POA: Diagnosis not present

## 2023-07-24 DIAGNOSIS — B351 Tinea unguium: Secondary | ICD-10-CM | POA: Diagnosis not present

## 2023-08-01 NOTE — Progress Notes (Signed)
Subjective:  Patient ID: Lynn Potter, female    DOB: [birth year],  MRN: [id number]  77 y.o. female presents at risk foot care with history of diabetic neuropathy and painful elongated mycotic toenails 1-5 bilaterally which are tender when wearing enclosed shoe gear. Pain is relieved with periodic professional debridement.  New problem(s): None   PCP is Armando Gang, FNP , and last visit was two weeks ago.  No Known Allergies  Review of Systems: Negative except as noted in the HPI.   Objective:  Lynn Potter is a pleasant 77 y.o. female in NAD. AAO x 3.  Vascular Examination: Vascular status intact b/l with palpable pedal pulses. CFT immediate b/l. Pedal hair present. No edema. No pain with calf compression b/l. Skin temperature gradient WNL b/l. No varicosities noted. No cyanosis or clubbing noted.  Neurological Examination: Sensation grossly intact b/l with 10 gram monofilament. Vibratory sensation intact b/l.  Dermatological Examination: Pedal skin with normal turgor, texture and tone b/l. No open wounds nor interdigital macerations noted. Toenails 1-5 b/l thick, discolored, elongated with subungual debris and pain on dorsal palpation. No hyperkeratotic lesions noted b/l.   Musculoskeletal Examination: Muscle strength 5/5 to b/l LE.  No pain, crepitus noted b/l. No gross pedal deformities. Patient ambulates independently without assistive aids.   Radiographs: None Assessment:   1. Pain due to onychomycosis of toenails of both feet    Plan:  -Consent given for treatment as described below: -Examined patient. -Continue foot and shoe inspections daily. Monitor blood glucose per PCP/Endocrinologist's recommendations. -Patient to continue soft, supportive shoe gear daily. -Toenails 1-5 b/l were debrided in length and girth with sterile nail nippers and dremel without iatrogenic bleeding.  -Patient/POA to call should there be question/concern in the interim.  Return in  about 3 months (around 10/24/2023).  Freddie Breech, DPM

## 2023-08-03 ENCOUNTER — Inpatient Hospital Stay: Payer: 59

## 2023-08-03 ENCOUNTER — Ambulatory Visit: Payer: Medicare Other

## 2023-08-03 ENCOUNTER — Inpatient Hospital Stay: Payer: 59 | Admitting: Nurse Practitioner

## 2023-08-03 ENCOUNTER — Other Ambulatory Visit: Payer: Medicare Other

## 2023-08-03 ENCOUNTER — Ambulatory Visit: Payer: Medicare Other | Admitting: Internal Medicine

## 2023-08-11 ENCOUNTER — Inpatient Hospital Stay (HOSPITAL_BASED_OUTPATIENT_CLINIC_OR_DEPARTMENT_OTHER): Payer: 59 | Admitting: Nurse Practitioner

## 2023-08-11 ENCOUNTER — Inpatient Hospital Stay: Payer: 59

## 2023-08-11 ENCOUNTER — Encounter: Payer: Self-pay | Admitting: Nurse Practitioner

## 2023-08-11 ENCOUNTER — Inpatient Hospital Stay: Payer: 59 | Attending: Internal Medicine

## 2023-08-11 VITALS — BP 129/63 | HR 86 | Temp 98.5°F | Wt 186.0 lb

## 2023-08-11 DIAGNOSIS — M25559 Pain in unspecified hip: Secondary | ICD-10-CM | POA: Insufficient documentation

## 2023-08-11 DIAGNOSIS — D509 Iron deficiency anemia, unspecified: Secondary | ICD-10-CM | POA: Insufficient documentation

## 2023-08-11 DIAGNOSIS — F17210 Nicotine dependence, cigarettes, uncomplicated: Secondary | ICD-10-CM | POA: Insufficient documentation

## 2023-08-11 DIAGNOSIS — D500 Iron deficiency anemia secondary to blood loss (chronic): Secondary | ICD-10-CM | POA: Diagnosis not present

## 2023-08-11 LAB — COMPREHENSIVE METABOLIC PANEL
ALT: 11 U/L (ref 0–44)
AST: 20 U/L (ref 15–41)
Albumin: 3.6 g/dL (ref 3.5–5.0)
Alkaline Phosphatase: 75 U/L (ref 38–126)
Anion gap: 10 (ref 5–15)
BUN: <5 mg/dL — ABNORMAL LOW (ref 8–23)
CO2: 24 mmol/L (ref 22–32)
Calcium: 9.2 mg/dL (ref 8.9–10.3)
Chloride: 97 mmol/L — ABNORMAL LOW (ref 98–111)
Creatinine, Ser: 0.8 mg/dL (ref 0.44–1.00)
GFR, Estimated: >60 mL/min (ref >60)
Glucose, Bld: 98 mg/dL (ref 70–99)
Potassium: 2.9 mmol/L — ABNORMAL LOW (ref 3.5–5.1)
Sodium: 131 mmol/L — ABNORMAL LOW (ref 135–145)
Total Bilirubin: 0.9 mg/dL (ref <1.2)
Total Protein: 7.3 g/dL (ref 6.5–8.1)

## 2023-08-11 LAB — CBC WITH DIFFERENTIAL/PLATELET
Abs Immature Granulocytes: 0.03 10*3/uL (ref 0.00–0.07)
Basophils Absolute: 0 10*3/uL (ref 0.0–0.1)
Basophils Relative: 0 %
Eosinophils Absolute: 0.2 10*3/uL (ref 0.0–0.5)
Eosinophils Relative: 3 %
HCT: 30.3 % — ABNORMAL LOW (ref 36.0–46.0)
Hemoglobin: 9.2 g/dL — ABNORMAL LOW (ref 12.0–15.0)
Immature Granulocytes: 0 %
Lymphocytes Relative: 25 %
Lymphs Abs: 1.7 10*3/uL (ref 0.7–4.0)
MCH: 23.2 pg — ABNORMAL LOW (ref 26.0–34.0)
MCHC: 30.4 g/dL (ref 30.0–36.0)
MCV: 76.5 fL — ABNORMAL LOW (ref 80.0–100.0)
Monocytes Absolute: 0.6 10*3/uL (ref 0.1–1.0)
Monocytes Relative: 8 %
Neutro Abs: 4.2 10*3/uL (ref 1.7–7.7)
Neutrophils Relative %: 64 %
Platelets: 282 10*3/uL (ref 150–400)
RBC: 3.96 MIL/uL (ref 3.87–5.11)
RDW: 15.4 % (ref 11.5–15.5)
WBC: 6.7 10*3/uL (ref 4.0–10.5)
nRBC: 0 % (ref 0.0–0.2)

## 2023-08-11 LAB — FERRITIN: Ferritin: 14 ng/mL (ref 11–307)

## 2023-08-11 LAB — IRON AND TIBC
Iron: 26 ug/dL — ABNORMAL LOW (ref 28–170)
Saturation Ratios: 7 % — ABNORMAL LOW (ref 10.4–31.8)
TIBC: 392 ug/dL (ref 250–450)
UIBC: 366 ug/dL

## 2023-08-11 MED ORDER — FE BISGLY-VIT C-VIT B12-FA 28-60-0.008-0.4 MG PO CAPS: 1.0000 | ORAL_CAPSULE | Freq: Every day | ORAL | 3 refills | Status: AC

## 2023-08-11 MED ORDER — POTASSIUM CHLORIDE CRYS ER 20 MEQ PO TBCR: 20.0000 meq | EXTENDED_RELEASE_TABLET | Freq: Every day | ORAL | 0 refills | Status: DC

## 2023-08-11 NOTE — Patient Instructions (Signed)
Please start Gentle Iron, 1 tablet daily.

## 2023-08-11 NOTE — Progress Notes (Signed)
[facility] CONSULT NOTE  Patient Care Team: Armando Gang, FNP as PCP - General (Family Medicine) Earna Coder, MD as Consulting Physician (Oncology)  CHIEF COMPLAINTS/PURPOSE OF CONSULTATION: Anemia  Oncology History Overview Note  # 2014-  DUODENAL NEUROENDOCRINE TUMOR, ENDOSCOPIC RESECTION:  - WELL-DIFFERENTIATED NEUROENDOCRINE TUMOR.; Tumor size: 0.6 cm  Tumor focality: single focus; Histologic type: well differentiated neuroendocrine tumor  Histologic grade: grade 1; Mitotic rate: < 2 mitosis in 10 hpf and < 3% KI-67 index   #Chronic mild anemia microcytic-baseline-11-12; June September 2020-9; microcytic; [multiple prior EGD/colonoscopies; [facility]]; no bone marrow biopsy; October 2020 EGD colonoscopy-no acute bleeding etiology [Dr.Toledo]  # DM/active smoker/remote history of TIA   Malignant carcinoid tumor of duodenum (HCC)  07/05/2019 Initial Diagnosis   Malignant carcinoid tumor of duodenum (HCC)     HISTORY OF PRESENTING ILLNESS: Obese.  Walks with a rolling walker. Accompanied by family.  Lynn Potter 77 y.o. female is here for follow-up of anemia, iron deficiency of unclear etiology, who returns to clinic for follow up. She had hip surgery this summer and reports blood loss post operatively. She's now back at baseline and denies complaints. She takes several vitamins but denies taking oral iron pills. She denies black or bloody stools. No vaginal bleeding. She has hip pain which is unchanged.   Review of Systems  Constitutional:  Negative for chills, diaphoresis, fever, malaise/fatigue and weight loss.  HENT:  Negative for nosebleeds and sore throat.   Respiratory:  Negative for cough, hemoptysis, sputum production, shortness of breath and wheezing.   Cardiovascular:  Negative for chest pain, palpitations, orthopnea and leg swelling.  Gastrointestinal:  Negative for abdominal pain, blood in stool, constipation, diarrhea, heartburn, melena, nausea  and vomiting.  Genitourinary:  Negative for dysuria, frequency, hematuria and urgency.  Musculoskeletal:  Positive for back pain and joint pain. Negative for falls.  Skin:  Negative for itching and rash.  Neurological:  Negative for dizziness, tingling, focal weakness, weakness and headaches.  Endo/Heme/Allergies:  Does not bruise/bleed easily.  Psychiatric/Behavioral:  Negative for depression. The patient is not nervous/anxious and does not have insomnia.     MEDICAL HISTORY:  Past Medical History:  Diagnosis Date   AF (atrial fibrillation) (HCC)    a.) CHA2DS2-VASc: 47 (age x2, sex, HTN, vascular disease history, T2DM); b.) rate/rhythm maintained intrinsically without pharmacological intervention; no chronic oral anticoagulation   Aortic atherosclerosis (HCC)    Arthritis    Asthma    B12 deficiency    Bilateral carpal tunnel syndrome    CAD (coronary artery disease)    Cerebral microvascular disease    Chicken pox    Clotting disorder (HCC)    COPD (chronic obstructive pulmonary disease) (HCC)    DDD (degenerative disc disease), lumbosacral    a.) s/p L4-S1 fusion in 2006   Depression    Diabetic peripheral neuropathy (HCC)    Diastolic dysfunction 06/06/2022   a.) TTE 06/06/2022: EF >55%, no RWMAs, mild LVH, triv MR/TR, mild AR, RVSP 20.5, G1DD   Diverticulosis    DOE (dyspnea on exertion)    GERD (gastroesophageal reflux disease)    Glaucoma    Hepatic steatosis    HH (hiatus hernia)    History of kidney stones    HLD (hyperlipidemia)    Hypertension    IDA (iron deficiency anemia)    Insomnia    a.) uses trazodone PRN   Internal hemorrhoids 12/25/2014   LAFB (left anterior fascicular block)  Long term current use of aspirin    Lumbar spinal stenosis    Migraines    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   a.) Bx 10/18/2020 - spindle cell neoplasm; b.) s/p partial mastectomy 12/06/2020 --> final pathology revealed estrogen receptor (+) myofibroblastoma    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; well differentiated --> s/p bowel resection   Obesity    OSA (obstructive sleep apnea)    a.) non-compliant with prescribed nocturnal PAP therapy   Personal history of tobacco use, presenting hazards to health 06/18/2015   RLS (restless legs syndrome)    a.) on ropinirole   Status post carpal tunnel release    T2DM (type 2 diabetes mellitus) (HCC)    Tubular adenoma of colon     SURGICAL HISTORY: Past Surgical History:  Procedure Laterality Date   ABDOMINAL HYSTERECTOMY N/A 1982   APPENDECTOMY     BACK SURGERY  2002   CARPAL TUNNEL RELEASE     CATARACT EXTRACTION  2006   CHOLECYSTECTOMY     COLONOSCOPY WITH ESOPHAGOGASTRODUODENOSCOPY (EGD)     COLONOSCOPY WITH PROPOFOL N/A 08/03/2019   Procedure: COLONOSCOPY WITH PROPOFOL;  Surgeon: Toledo, Boykin Nearing, MD;  Location: [facility] ENDOSCOPY;  Service: Gastroenterology;  Laterality: N/A;   ESOPHAGOGASTRODUODENOSCOPY (EGD) WITH PROPOFOL N/A 02/16/2017   Procedure: ESOPHAGOGASTRODUODENOSCOPY (EGD) WITH PROPOFOL;  Surgeon: Scot Jun, MD;  Location[facility] ENDOSCOPY;  Service: Endoscopy;  Laterality: N/A;   ESOPHAGOGASTRODUODENOSCOPY (EGD) WITH PROPOFOL N/A 08/03/2019   Procedure: ESOPHAGOGASTRODUODENOSCOPY (EGD) WITH PROPOFOL;  Surgeon: Toledo, Boykin Nearing, MD;  Location: [facility] ENDOSCOPY;  Service: Gastroenterology;  Laterality: N/A;   LUMBAR FUSION  2006   MASTECTOMY PARTIAL / LUMPECTOMY Right 12/06/2020   REPAIR ROTATOR CUFF TEAR     TOTAL HIP ARTHROPLASTY Left 06/04/2023   Procedure: TOTAL HIP ARTHROPLASTY ANTERIOR APPROACH;  Surgeon: Reinaldo Berber, MD;  Location: [facility] ORS;  Service: Orthopedics;  Laterality: Left;   TOTAL KNEE ARTHROPLASTY Bilateral 1999   UPPER ESOPHAGEAL ENDOSCOPIC ULTRASOUND (EUS) N/A 01/03/2016   Procedure: UPPER ESOPHAGEAL ENDOSCOPIC ULTRASOUND (EUS);  Surgeon: Bearl Mulberry, MD;  Location: [facility] ENDOSCOPY;  Service: Gastroenterology;   Laterality: N/A;    SOCIAL HISTORY: Social History   Socioeconomic History   Marital status: Single    Spouse name: Not on file   Number of children: Not on file   Years of education: Not on file   Highest education level: Not on file  Occupational History   Not on file  Tobacco Use   Smoking status: Every Day    Current packs/day: 0.40    Types: Cigarettes   Smokeless tobacco: Never  Vaping Use   Vaping status: Never Used  Substance and Sexual Activity   Alcohol use: No   Drug use: No   Sexual activity: Not on file  Other Topics Concern   Not on file  Social History Narrative   Lives in [place], lives alone. Rolling walker sec to back/hip pain. Used work in [employer]. Smoke ~1ppd/>50; No alcohol.    Social Determinants of Health   Financial Resource Strain: Not on file  Food Insecurity: No Food Insecurity (06/04/2023)   Hunger Vital Sign    Worried About Running Out of Food in the Last Year: Never true    Ran Out of Food in the Last Year: Never true  Transportation Needs: No Transportation Needs (06/04/2023)   PRAPARE - Transportation    Lack of Transportation (Medical): No    Lack of  Transportation (Non-Medical): No  Physical Activity: Not on file  Stress: Not on file  Social Connections: Not on file  Intimate Partner Violence: Not At Risk (06/04/2023)   Humiliation, Afraid, Rape, and Kick questionnaire    Fear of Current or Ex-Partner: No    Emotionally Abused: No    Physically Abused: No    Sexually Abused: No    FAMILY HISTORY: Family History  Problem Relation Age of Onset   Diabetes Mother    Diabetes Sister     ALLERGIES:  has No Known Allergies.  MEDICATIONS:  Current Outpatient Medications  Medication Sig Dispense Refill   acetaminophen (TYLENOL) 500 MG tablet Take 2 tablets (1,000 mg total) by mouth every 8 (eight) hours. 30 tablet 0   Ascorbic Acid (VITAMIN C) 1000 MG tablet Take 1,000 mg by mouth every morning.     aspirin 81 MG tablet Take  81 mg by mouth daily.     atorvastatin (LIPITOR) 40 MG tablet Take 40 mg by mouth daily.     calcium carbonate (OSCAL) 1500 (600 Ca) MG TABS tablet Take 600 mg of elemental calcium by mouth daily with breakfast.     COMBIVENT RESPIMAT 20-100 MCG/ACT AERS respimat USE 1 VIAL VIA NEBULIZER 4 TIMES A DAY AS NEEDED     diphenhydrAMINE (BENADRYL) 25 mg capsule Take 1 capsule (25 mg total) by mouth every 6 (six) hours as needed for itching.     docusate sodium (COLACE) 100 MG capsule Take 1 capsule (100 mg total) by mouth 2 (two) times daily.     DULoxetine (CYMBALTA) 30 MG capsule Take 30 mg by mouth every morning.     feeding supplement (ENSURE ENLIVE / ENSURE PLUS) LIQD Take 237 mLs by mouth 2 (two) times daily between meals. 237 mL 12   gabapentin (NEURONTIN) 300 MG capsule Take 300 mg by mouth 3 (three) times daily.     insulin lispro (HUMALOG) 100 UNIT/ML KwikPen Inject 3 Units into the skin 3 (three) times daily.      JARDIANCE 25 MG TABS tablet Take 25 mg by mouth daily.      latanoprost (XALATAN) 0.005 % ophthalmic solution Place 1 drop into both eyes at bedtime.      LEVEMIR FLEXTOUCH 100 UNIT/ML Pen Inject 25 Units into the skin daily. Patient reports she only takes Levemir if her glucose is >150 mg/dl which is rare for her  5   metFORMIN (GLUCOPHAGE) 500 MG tablet Take 500 mg by mouth 2 (two) times daily with a meal.     olmesartan-hydrochlorothiazide (BENICAR HCT) 40-25 MG tablet Take 1 tablet by mouth daily.     Omega-3 Fatty Acids (FISH OIL) 1000 MG CAPS Take by mouth daily.     ondansetron (ZOFRAN) 4 MG tablet Take 1 tablet (4 mg total) by mouth every 6 (six) hours as needed for nausea.     pantoprazole (PROTONIX) 20 MG tablet Take 1 tablet (20 mg total) by mouth daily.     pantoprazole (PROTONIX) 40 MG tablet Take 1 tablet by mouth 2 (two) times daily.     rOPINIRole (REQUIP) 1 MG tablet TAKE 1 TABLET BY MOUTH EVERYDAY AT BEDTIME     traZODone (DESYREL) 50 MG tablet      vitamin B-12  1000 MCG tablet Take 1 tablet (1,000 mcg total) by mouth daily. 30 tablet 1   enoxaparin (LOVENOX) 40 MG/0.4ML injection Inject 0.4 mLs (40 mg total) into the skin daily for 14 days.  No current facility-administered medications for this visit.    PHYSICAL EXAMINATION: Vitals:   08/11/23 1354  BP: 129/63  Pulse: 86  Temp: 98.5 F (36.9 C)  SpO2: 100%   Filed Weights   08/11/23 1354  Weight: 186 lb (84.4 kg)   Physical Exam Constitutional:      Appearance: She is not ill-appearing.  Cardiovascular:     Rate and Rhythm: Normal rate and regular rhythm.  Pulmonary:     Effort: No respiratory distress.  Abdominal:     General: There is no distension.     Palpations: Abdomen is soft.     Tenderness: There is no guarding.  Musculoskeletal:     Comments: 4 wheel rolling walker  Skin:    General: Skin is warm.     Coloration: Skin is not pale.  Neurological:     Mental Status: She is alert and oriented to person, place, and time.  Psychiatric:        Mood and Affect: Mood and affect normal.        Behavior: Behavior normal.     LABORATORY DATA:  I have reviewed the data as listed Lab Results  Component Value Date   WBC 6.7 08/11/2023   HGB 9.2 (L) 08/11/2023   HCT 30.3 (L) 08/11/2023   MCV 76.5 (L) 08/11/2023   PLT 282 08/11/2023   Recent Labs    05/22/23 1119 06/05/23 0547 06/07/23 0627 06/07/23 1051 06/08/23 0408 06/09/23 0829 08/11/23 1336  NA 137   < > 127*   < > 133* 134* 131*  K 3.3*   < > 3.1*  --  3.7 3.6 2.9*  CL 104   < > 91*  --  100 98 97*  CO2 25   < > 27  --  28 26 24   GLUCOSE 118*   < > 128*  --  105* 124* 98  BUN <5*   < > 8  --  6* 7* <5*  CREATININE 0.72   < > 0.56  --  0.57 0.49 0.80  CALCIUM 9.7   < > 8.5*  --  8.3* 8.8* 9.2  GFRNONAA >60   < > >60  --  >60 >60 >60  PROT 7.3  --  5.9*  --   --   --  7.3  ALBUMIN 3.6  --  2.4*  --   --   --  3.6  AST 23  --  18  --   --   --  20  ALT 19  --  17  --   --   --  11  ALKPHOS 67  --   62  --   --   --  75  BILITOT 0.8  --  1.3*  --   --   --  0.9   < > = values in this interval not displayed.   Iron/TIBC/Ferritin/ %Sat    Component Value Date/Time   IRON 30 08/01/2022 1304   TIBC 405 08/01/2022 1304   FERRITIN 14 08/01/2022 1304   IRONPCTSAT 7 (L) 08/01/2022 1304      No results found.  Assessment & Plan:   # Iron deficiency anemia- hemoglobin today 9.2. Baseline 12-13. Reviewed this with patient. Ferritin previously 14, iron sat 7%. Today's results are pending. Clinically she is stable. She continues to decline IV Iron infusion. Not taking oral iron and I again encouraged her to do so. Recommend gentle iron- less likely to cause GI upset.  She has blood work with pcp and prefers annual follow up. Recommend close monitoring given drop in her counts.    # Etiology of iron deficiency-unclear.  October 2020-EGD colonoscopy no obvious etiology of bleeding noted. She has had total hip arthroplasty which may account for some blood loss. Incidental moderate hiatal hernia on imaging which could account for some malabsorption. If no response to oral iron and she continues to decline IV iron, consider sublingual/barimelt.    # NET remote history-stage I.  Low risk of recurrence.  S/p bowel re-section; clinically no dense of recurrence.  Stable.   # Hypokalemia- K 2.9. Not on diuretics. Start Kdur 20 meq daily. Follow up with pcp for ongoing management.    # Smoking: counseled to quit. Followed by LDCT screening program. April 2024 imaging benign/lung rads 2. Continue follow up with pcp.     DISPOSITION:  # No Venofer # 12 months - labs (cbc, cmp, iron stuides, ferritin), Dr Donneta Romberg, +/- venofer  No problem-specific Assessment & Plan notes found for this encounter.  All questions were answered. The patient knows to call the clinic with any problems, questions or concerns.    Alinda Dooms, NP 08/11/2023   CC: Franco Nones, FNP

## 2023-09-09 ENCOUNTER — Other Ambulatory Visit: Payer: Self-pay | Admitting: Nurse Practitioner

## 2023-09-22 ENCOUNTER — Other Ambulatory Visit: Payer: Self-pay | Admitting: Nurse Practitioner

## 2023-10-05 ENCOUNTER — Encounter: Payer: Self-pay | Admitting: Internal Medicine

## 2023-10-28 NOTE — Progress Notes (Unsigned)
Referring Physician:  Reinaldo Berber, MD [address] [place],  [place] [address]  Primary Physician:  Armando Gang, FNP  History of Present Illness: 10/29/2023 [name] has a history of breast CA, HTN, afib, CAD, malignant carcinoid tumor of duodenum, DM, chronic pain, hyperlipidemia, OSA, and failed back surgical syndrome.   History of lumbar surgery in 2002 and lumbar fusion in 2006. She did see improvement after this for a few years.   Has seen pain management in the past Laban Emperor). She's had short term relief with previous injections (up to a month).   She has constant LBP with left posterior and medial leg pain x years. No significant right leg pain. LBP = left leg pain. She has numbness, tingling, and weakness in left leg. Pain is worse with walking. Some relief shopping cart. Some improvement with hot/warm shower.   She is taking tylneol, cymbalta, neurontin.   She smokes 6-7 cigarettes per day x 60 years. She has cut down.   Last HgbA1c on 10/15/22 was 6.5.   Bowel/Bladder Dysfunction: some bladder and bowel urgency x 1 year. No incontinence. No perineal numbness.   Conservative measures:  Physical therapy: years ago, nothing recent.  Multimodal medical therapy including regular antiinflammatories: tylenol, cymbalta, neurontin  Injections:  Left L3 TF and right L2-L3 ESI 09/25/20 Left L3 TF and right L2-L3 ESI 09/05/20 Right L2-L3 IL ESI 08/21/20 Left L3 TF and right L2-L3 ESI on 07/26/20  Past Surgery:  Lumbar fusion 2006 Lumbar surgery 2002  Dymphna E Licausi has \\no  symptoms of cervical myelopathy.  The symptoms are causing a significant impact on the patient's life.   Review of Systems:  A 10 point review of systems is negative, except for the pertinent positives and negatives detailed in the HPI.  Past Medical History: Past Medical History:  Diagnosis Date   AF (atrial fibrillation) (HCC)    a.) CHA2DS2-VASc: 53 (age x2, sex, HTN, vascular  disease history, T2DM); b.) rate/rhythm maintained intrinsically without pharmacological intervention; no chronic oral anticoagulation   Aortic atherosclerosis (HCC)    Arthritis    Asthma    B12 deficiency    Bilateral carpal tunnel syndrome    CAD (coronary artery disease)    Cerebral microvascular disease    Chicken pox    Clotting disorder (HCC)    COPD (chronic obstructive pulmonary disease) (HCC)    DDD (degenerative disc disease), lumbosacral    a.) s/p L4-S1 fusion in 2006   Depression    Diabetic peripheral neuropathy (HCC)    Diastolic dysfunction 06/06/2022   a.) TTE 06/06/2022: EF >55%, no RWMAs, mild LVH, triv MR/TR, mild AR, RVSP 20.5, G1DD   Diverticulosis    DOE (dyspnea on exertion)    GERD (gastroesophageal reflux disease)    Glaucoma    Hepatic steatosis    HH (hiatus hernia)    History of kidney stones    HLD (hyperlipidemia)    Hypertension    IDA (iron deficiency anemia)    Insomnia    a.) uses trazodone PRN   Internal hemorrhoids 12/25/2014   LAFB (left anterior fascicular block)    Long term current use of aspirin    Lumbar spinal stenosis    Migraines    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   a.) Bx 10/18/2020 - spindle cell neoplasm; b.) s/p partial mastectomy 12/06/2020 --> final pathology revealed estrogen receptor (+) myofibroblastoma   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;  well differentiated --> s/p bowel resection   Obesity    OSA (obstructive sleep apnea)    a.) non-compliant with prescribed nocturnal PAP therapy   Personal history of tobacco use, presenting hazards to health 06/18/2015   RLS (restless legs syndrome)    a.) on ropinirole   Status post carpal tunnel release    T2DM (type 2 diabetes mellitus) (HCC)    Tubular adenoma of colon     Past Surgical History: Past Surgical History:  Procedure Laterality Date   ABDOMINAL HYSTERECTOMY N/A 1982   APPENDECTOMY     BACK SURGERY  2002    CARPAL TUNNEL RELEASE     CATARACT EXTRACTION  2006   CHOLECYSTECTOMY     COLONOSCOPY WITH ESOPHAGOGASTRODUODENOSCOPY (EGD)     COLONOSCOPY WITH PROPOFOL N/A 08/03/2019   Procedure: COLONOSCOPY WITH PROPOFOL;  Surgeon: Toledo, Boykin Nearing, MD;  Location: [facility] ENDOSCOPY;  Service: Gastroenterology;  Laterality: N/A;   ESOPHAGOGASTRODUODENOSCOPY (EGD) WITH PROPOFOL N/A 02/16/2017   Procedure: ESOPHAGOGASTRODUODENOSCOPY (EGD) WITH PROPOFOL;  Surgeon: Scot Jun, MD;  Location: [facility] ENDOSCOPY;  Service: Endoscopy;  Laterality: N/A;   ESOPHAGOGASTRODUODENOSCOPY (EGD) WITH PROPOFOL N/A 08/03/2019   Procedure: ESOPHAGOGASTRODUODENOSCOPY (EGD) WITH PROPOFOL;  Surgeon: Toledo, Boykin Nearing, MD;  Location: [facility] ENDOSCOPY;  Service: Gastroenterology;  Laterality: N/A;   LUMBAR FUSION  2006   MASTECTOMY PARTIAL / LUMPECTOMY Right 12/06/2020   REPAIR ROTATOR CUFF TEAR     TOTAL HIP ARTHROPLASTY Left 06/04/2023   Procedure: TOTAL HIP ARTHROPLASTY ANTERIOR APPROACH;  Surgeon: Reinaldo Berber, MD;  Location: [facility] ORS;  Service: Orthopedics;  Laterality: Left;   TOTAL KNEE ARTHROPLASTY Bilateral 1999   UPPER ESOPHAGEAL ENDOSCOPIC ULTRASOUND (EUS) N/A 01/03/2016   Procedure: UPPER ESOPHAGEAL ENDOSCOPIC ULTRASOUND (EUS);  Surgeon: Bearl Mulberry, MD;  Location: [facility] ENDOSCOPY;  Service: Gastroenterology;  Laterality: N/A;    Allergies: Allergies as of 10/29/2023 - Review Complete 10/29/2023  Allergen Reaction Noted   Penicillins Hives 10/29/2023    Medications: Outpatient Encounter Medications as of 10/29/2023  Medication Sig   acetaminophen (TYLENOL) 500 MG tablet Take 2 tablets (1,000 mg total) by mouth every 8 (eight) hours.   Ascorbic Acid (VITAMIN C) 1000 MG tablet Take 1,000 mg by mouth every morning.   aspirin 81 MG tablet Take 81 mg by mouth daily.   atorvastatin (LIPITOR) 40 MG tablet Take 40 mg by mouth daily.   calcium carbonate (OSCAL) 1500 (600 Ca) MG TABS tablet Take 600 mg of  elemental calcium by mouth daily with breakfast.   COMBIVENT RESPIMAT 20-100 MCG/ACT AERS respimat USE 1 VIAL VIA NEBULIZER 4 TIMES A DAY AS NEEDED   diphenhydrAMINE (BENADRYL) 25 mg capsule Take 1 capsule (25 mg total) by mouth every 6 (six) hours as needed for itching.   docusate sodium (COLACE) 100 MG capsule Take 1 capsule (100 mg total) by mouth 2 (two) times daily.   DULoxetine (CYMBALTA) 30 MG capsule Take 30 mg by mouth every morning.   Fe Bisgly-Vit C-Vit B12-FA 28-60-0.008-0.4 MG CAPS Take 1 tablet by mouth daily. If stomach upset recommend taking with food.   feeding supplement (ENSURE ENLIVE / ENSURE PLUS) LIQD Take 237 mLs by mouth 2 (two) times daily between meals.   gabapentin (NEURONTIN) 300 MG capsule Take 300 mg by mouth 3 (three) times daily.   insulin lispro (HUMALOG) 100 UNIT/ML KwikPen Inject 3 Units into the skin 3 (three) times daily.    JARDIANCE 25 MG TABS tablet Take 25 mg by mouth daily.  latanoprost (XALATAN) 0.005 % ophthalmic solution Place 1 drop into both eyes at bedtime.    LEVEMIR FLEXTOUCH 100 UNIT/ML Pen Inject 25 Units into the skin daily. Patient reports she only takes Levemir if her glucose is >150 mg/dl which is rare for her   metFORMIN (GLUCOPHAGE) 500 MG tablet Take 500 mg by mouth 2 (two) times daily with a meal.   olmesartan-hydrochlorothiazide (BENICAR HCT) 40-25 MG tablet Take 1 tablet by mouth daily.   Omega-3 Fatty Acids (FISH OIL) 1000 MG CAPS Take by mouth daily.   pantoprazole (PROTONIX) 20 MG tablet Take 1 tablet (20 mg total) by mouth daily.   pantoprazole (PROTONIX) 40 MG tablet Take 1 tablet by mouth 2 (two) times daily.   potassium chloride SA (KLOR-CON M) 20 MEQ tablet TAKE 1 TABLET BY MOUTH EVERY DAY   rOPINIRole (REQUIP) 1 MG tablet TAKE 1 TABLET BY MOUTH EVERYDAY AT BEDTIME   traZODone (DESYREL) 50 MG tablet    vitamin B-12 1000 MCG tablet Take 1 tablet (1,000 mcg total) by mouth daily.   enoxaparin (LOVENOX) 40 MG/0.4ML injection  Inject 0.4 mLs (40 mg total) into the skin daily for 14 days.   ondansetron (ZOFRAN) 4 MG tablet Take 1 tablet (4 mg total) by mouth every 6 (six) hours as needed for nausea. (Patient not taking: Reported on 10/29/2023)   No facility-administered encounter medications on file as of 10/29/2023.    Social History: Social History   Tobacco Use   Smoking status: Every Day    Current packs/day: 0.40    Types: Cigarettes   Smokeless tobacco: Never  Vaping Use   Vaping status: Never Used  Substance Use Topics   Alcohol use: No   Drug use: No    Family Medical History: Family History  Problem Relation Age of Onset   Diabetes Mother    Diabetes Sister     Physical Examination: Vitals:   10/29/23 0855  BP: 128/80    General: Patient is well developed, well nourished, calm, collected, and in no apparent distress. Attention to examination is appropriate.  Respiratory: Patient is breathing without any difficulty.   NEUROLOGICAL:     Awake, alert, oriented to person, place, and time.  Speech is clear and fluent. Fund of knowledge is appropriate.   Cranial Nerves: Pupils equal round and reactive to light.  Facial tone is symmetric.    Diffuse lower posterior lumbar tenderness.   No abnormal lesions on exposed skin.   Strength: Side Biceps Triceps Deltoid Interossei Grip Wrist Ext. Wrist Flex.  R 5 5 5 5 5 5 5   L 5 5 5 5 5 5 5    Side Iliopsoas Quads Hamstring PF DF EHL  R 5 5 5 5 5 5   L 5 5 5 5 5 5    Reflexes are 1+ and symmetric at the biceps, brachioradialis, patella and achilles.   Hoffman's is absent.  Clonus is not present.   Bilateral upper and lower extremity sensation is intact to light touch.     She has some shaking in her upper extremities with strength testing. States she's had this for years.   She has mild groin pain with IR/ER or left hip. No pain with IR/ER of left hip.   Gait not tested. She uses a rollator.   Medical Decision Making  Imaging: MRI  of lumbar spine dated 07/22/19:  FINDINGS: Segmentation:  5 lumbar type vertebral bodies.   Alignment:  Anterolisthesis L3-4 of 2 mm.   Vertebrae:  Previous fusion procedure L4 to sacrum.   Conus medullaris and cauda equina: Conus extends to the L1 level. Conus and cauda equina appear normal.   Paraspinal and other soft tissues: Negative   Disc levels:   Mild non-compressive disc bulges at T12-L1, L1-2 and L2-3. Mild facet hypertrophy at L2-3. No compressive stenosis in that region.   L3-4: Advanced bilateral facet arthropathy with 2 mm of anterolisthesis. Endplate osteophytes and disc protrusion. Severe spinal stenosis at this level likely to cause neural compression.   L4 to sacrum: Previous posterior decompression, diskectomy and fusion has a good appearance with wide patency of canal and foramina.   Compared to the study of last year, findings are similar.   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     Electronically Signed   By: Paulina Fusi M.D.   On: 07/22/2019 22:28    I have personally reviewed the images and agree with the above interpretation.  Assessment and Plan: Ms. Mckeough has history of lumbar surgery x 2 with fusion in 2006. She did well for a few years then pain started.   She has constant LBP with left posterior and medial leg pain x years. No significant right leg pain. LBP = left leg pain. She has numbness, tingling, and weakness in left leg. Pain is worse with walking.   MRI from 2020 shows adjacent level disease at L3-L4 with slip and severe spinal stenosis. Fusion L4-S1.   Treatment options discussed with patient and following plan made:   - MRI of lumbar spine to further evaluate spinal stenosis seen on previous imaging. She requests WIDE BORE MRI.  - Lumbar xrays with flexion/extension views when she gets MRI.  - PT for lumbar spine.  Orders to [facility] in [place].  - Discussed she would need to complete PT prior to any surgical consideration. Would also need to quit smoking. This was discussed and encouraged.  - Will schedule phone visit to review MRI results once I get them back.   I spent a total of 35 minutes in face-to-face and non-face-to-face activities related to this patient's care today including review of outside records, review of imaging, review of symptoms, physical exam, discussion of differential diagnosis, discussion of treatment options, and documentation.   Thank you for involving me in the care of this patient.   Drake Leach PA-C Dept. of Neurosurgery

## 2023-10-29 ENCOUNTER — Encounter: Payer: Self-pay | Admitting: Orthopedic Surgery

## 2023-10-29 ENCOUNTER — Ambulatory Visit: Payer: 59 | Admitting: Orthopedic Surgery

## 2023-10-29 VITALS — BP 128/80 | Ht 65.0 in | Wt 175.0 lb

## 2023-10-29 DIAGNOSIS — Z981 Arthrodesis status: Secondary | ICD-10-CM

## 2023-10-29 DIAGNOSIS — M48061 Spinal stenosis, lumbar region without neurogenic claudication: Secondary | ICD-10-CM | POA: Diagnosis not present

## 2023-10-29 DIAGNOSIS — M4726 Other spondylosis with radiculopathy, lumbar region: Secondary | ICD-10-CM

## 2023-10-29 DIAGNOSIS — M5416 Radiculopathy, lumbar region: Secondary | ICD-10-CM

## 2023-10-29 DIAGNOSIS — M47816 Spondylosis without myelopathy or radiculopathy, lumbar region: Secondary | ICD-10-CM

## 2023-10-29 NOTE — Patient Instructions (Signed)
It was so nice to see you today. Thank you so much for coming in.    You have some wear and tear above your fusion at L3-L4 with spinal stenosis.   I want to get an MRI of your lower back to look into things further. We will get this approved through your insurance and [facility] will call you to schedule the appointment. Make sure they schedule you for the WIDE BORE MRI.   When you get the MRI done, I also want top get some updated xrays of your lower back. Please remind them to do these.   After you have the MRI and xrays, it takes 14-21 days for me to get the results back. Once I have them, we will call you to schedule a follow up phone visit with me to review them.   I sent physical therapy orders to [facility] in [place]. You can call them at number below if you don't hear from them to schedule your visit.   Continue to work on quitting smoking. You would need to do this prior to any surgery on your back.   Please do not hesitate to call if you have any questions or concerns. You can also message me in MyChart.   Drake Leach PA-C [phone]     The physicians and staff at [facility] at [facility] are committed to providing excellent care. You may receive a survey asking for feedback about your experience at our office. We value you your feedback and appreciate you taking the time to to fill it out. The [facility] leadership team is also available to discuss your experience in person, feel free to contact us [phone].

## 2023-10-30 ENCOUNTER — Other Ambulatory Visit: Payer: Self-pay | Admitting: *Deleted

## 2023-10-30 ENCOUNTER — Encounter: Payer: Self-pay | Admitting: Podiatry

## 2023-10-30 ENCOUNTER — Telehealth: Payer: Self-pay | Admitting: Internal Medicine

## 2023-10-30 ENCOUNTER — Ambulatory Visit (INDEPENDENT_AMBULATORY_CARE_PROVIDER_SITE_OTHER): Payer: 59 | Admitting: Podiatry

## 2023-10-30 VITALS — Ht 65.0 in | Wt 175.0 lb

## 2023-10-30 DIAGNOSIS — M79674 Pain in right toe(s): Secondary | ICD-10-CM | POA: Diagnosis not present

## 2023-10-30 DIAGNOSIS — M79675 Pain in left toe(s): Secondary | ICD-10-CM | POA: Diagnosis not present

## 2023-10-30 DIAGNOSIS — B351 Tinea unguium: Secondary | ICD-10-CM | POA: Diagnosis not present

## 2023-10-30 DIAGNOSIS — E1142 Type 2 diabetes mellitus with diabetic polyneuropathy: Secondary | ICD-10-CM | POA: Diagnosis not present

## 2023-10-30 DIAGNOSIS — D500 Iron deficiency anemia secondary to blood loss (chronic): Secondary | ICD-10-CM

## 2023-10-30 NOTE — Telephone Encounter (Signed)
Called patient to schedule appointment per inbasket message- scheduled and confirmed with patient as requested

## 2023-10-31 ENCOUNTER — Other Ambulatory Visit: Payer: Self-pay | Admitting: Nurse Practitioner

## 2023-11-02 ENCOUNTER — Encounter: Payer: Self-pay | Admitting: Internal Medicine

## 2023-11-04 ENCOUNTER — Other Ambulatory Visit: Payer: Self-pay | Admitting: Nurse Practitioner

## 2023-11-06 NOTE — Progress Notes (Signed)
  Subjective:  Patient ID: Lynn Potter, female    DOB: 08-19-1946,  MRN: [id number]  Lynn Potter presents to clinic today for at risk foot care with history of diabetic neuropathy and painful, elongated thickened toenails x 10 which are symptomatic when wearing enclosed shoe gear. This interferes with his/her daily activities.   New problem(s): None.   PCP is Armando Gang, FNP.  Allergies  Allergen Reactions   Penicillins Hives    Patient reported     Review of Systems: Negative except as noted in the HPI.  Objective: No changes noted in today's physical examination. There were no vitals filed for this visit. Lynn Potter is a pleasant 78 y.o. female in NAD. AAO x 3.  Vascular Examination: Vascular status intact b/l with palpable pedal pulses. CFT immediate b/l. Pedal hair present. No edema. No pain with calf compression b/l. Skin temperature gradient WNL b/l. No varicosities noted. No cyanosis or clubbing noted.  Neurological Examination: Pt has subjective symptoms of neuropathy. Sensation grossly intact b/l with 10 gram monofilament. Vibratory sensation intact b/l.  Dermatological Examination: Pedal skin with normal turgor, texture and tone b/l. No open wounds nor interdigital macerations noted. Toenails 1-5 b/l thick, discolored, elongated with subungual debris and pain on dorsal palpation. No hyperkeratotic lesions noted b/l.   Musculoskeletal Examination: Muscle strength 5/5 to b/l LE.  No pain, crepitus noted b/l. No gross pedal deformities. Patient ambulates independently without assistive aids.   Radiographs: None  Assessment/Plan: 1. Pain due to onychomycosis of toenails of both feet   2. Diabetic peripheral neuropathy associated with type 2 diabetes mellitus (HCC)    Patient was evaluated and treated. All patient's and/or POA's questions/concerns addressed on today's visit. Mycotic toenails 1-5 debrided in length and girth without incident. Continue soft,  supportive shoe gear daily. Report any pedal injuries to medical professional. Call office if there are any quesitons/concerns. -Patient/POA to call should there be question/concern in the interim.   No follow-ups on file.  Freddie Breech, DPM      [facility] LOCATION: [address] [place] [address]                   Office [phone]   [facility] LOCATION: [address] [place], [place] [address] Office [phone]

## 2023-11-09 ENCOUNTER — Encounter: Payer: Self-pay | Admitting: Internal Medicine

## 2023-11-17 ENCOUNTER — Inpatient Hospital Stay: Payer: 59

## 2023-11-17 ENCOUNTER — Inpatient Hospital Stay: Payer: 59 | Admitting: Internal Medicine

## 2023-12-06 ENCOUNTER — Other Ambulatory Visit: Payer: Self-pay | Admitting: Nurse Practitioner

## 2023-12-08 ENCOUNTER — Encounter: Payer: Self-pay | Admitting: Internal Medicine

## 2023-12-08 ENCOUNTER — Inpatient Hospital Stay: Payer: 59 | Attending: Internal Medicine

## 2023-12-08 ENCOUNTER — Inpatient Hospital Stay (HOSPITAL_BASED_OUTPATIENT_CLINIC_OR_DEPARTMENT_OTHER): Payer: 59 | Admitting: Internal Medicine

## 2023-12-08 VITALS — BP 129/71 | HR 86 | Temp 98.4°F | Resp 24 | Ht 65.0 in | Wt 175.2 lb

## 2023-12-08 DIAGNOSIS — F17210 Nicotine dependence, cigarettes, uncomplicated: Secondary | ICD-10-CM | POA: Insufficient documentation

## 2023-12-08 DIAGNOSIS — D509 Iron deficiency anemia, unspecified: Secondary | ICD-10-CM | POA: Diagnosis not present

## 2023-12-08 DIAGNOSIS — D500 Iron deficiency anemia secondary to blood loss (chronic): Secondary | ICD-10-CM

## 2023-12-08 DIAGNOSIS — M25559 Pain in unspecified hip: Secondary | ICD-10-CM | POA: Insufficient documentation

## 2023-12-08 LAB — BASIC METABOLIC PANEL - CANCER CENTER ONLY
Anion gap: 10 (ref 5–15)
BUN: 10 mg/dL (ref 8–23)
CO2: 26 mmol/L (ref 22–32)
Calcium: 9.5 mg/dL (ref 8.9–10.3)
Chloride: 98 mmol/L (ref 98–111)
Creatinine: 0.77 mg/dL (ref 0.44–1.00)
GFR, Estimated: >60 mL/min (ref >60)
Glucose, Bld: 110 mg/dL — ABNORMAL HIGH (ref 70–99)
Potassium: 3.4 mmol/L — ABNORMAL LOW (ref 3.5–5.1)
Sodium: 134 mmol/L — ABNORMAL LOW (ref 135–145)

## 2023-12-08 LAB — CBC WITH DIFFERENTIAL (CANCER CENTER ONLY)
Abs Immature Granulocytes: 0.02 10*3/uL (ref 0.00–0.07)
Basophils Absolute: 0 10*3/uL (ref 0.0–0.1)
Basophils Relative: 1 %
Eosinophils Absolute: 0.1 10*3/uL (ref 0.0–0.5)
Eosinophils Relative: 2 %
HCT: 40.5 % (ref 36.0–46.0)
Hemoglobin: 13.2 g/dL (ref 12.0–15.0)
Immature Granulocytes: 0 %
Lymphocytes Relative: 33 %
Lymphs Abs: 2.1 10*3/uL (ref 0.7–4.0)
MCH: 26.9 pg (ref 26.0–34.0)
MCHC: 32.6 g/dL (ref 30.0–36.0)
MCV: 82.5 fL (ref 80.0–100.0)
Monocytes Absolute: 0.6 10*3/uL (ref 0.1–1.0)
Monocytes Relative: 9 %
Neutro Abs: 3.6 10*3/uL (ref 1.7–7.7)
Neutrophils Relative %: 55 %
Platelet Count: 271 10*3/uL (ref 150–400)
RBC: 4.91 MIL/uL (ref 3.87–5.11)
RDW: 15.8 % — ABNORMAL HIGH (ref 11.5–15.5)
WBC Count: 6.4 10*3/uL (ref 4.0–10.5)
nRBC: 0 % (ref 0.0–0.2)

## 2023-12-08 NOTE — Progress Notes (Signed)
 Fatigue/weakness: NO Dyspena: NO  Light headedness: NO  Blood in stool: NO    She has been getting injections in her left eye due to blood seen in eye exam, sees Dr. Bing Plume in [place].

## 2023-12-08 NOTE — Assessment & Plan Note (Addendum)
#  Iron deficiency anemia-currently on p.o. iron. DECLINES Venofer infusion. Hemoglobin today is 13 -Continue p.o. iron every day.   #Etiology of iron deficiency-unclear.  October 2020-EGD colonoscopy no obvious etiology of bleeding noted.   # NET remote history-stage I.  Low risk of recurrence.  S/p bowel re-section; clinically no dense of recurrence.  Stable.   # Smoking: counseled  quitting smoking; APRIL- 2024- lung cancer screeing-NEGATIVE; continue surveillance imaging per PCP.   # DISPOSITION:  # no venofer # follow up in 12 months - MD; labs- cbc/cmp; iron stuides.ferritin; possible venofer Dr.B

## 2023-12-08 NOTE — Progress Notes (Signed)
 [facility] CONSULT NOTE  Patient Care Team: Armando Gang, FNP as PCP - General (Family Medicine) Earna Coder, MD as Consulting Physician (Oncology)  CHIEF COMPLAINTS/PURPOSE OF CONSULTATION: Anemia  Oncology History Overview Note  # 2014-  DUODENAL NEUROENDOCRINE TUMOR, ENDOSCOPIC RESECTION:  - WELL-DIFFERENTIATED NEUROENDOCRINE TUMOR.; Tumor size: 0.6 cm  Tumor focality: single focus; Histologic type: well differentiated neuroendocrine tumor  Histologic grade: grade 1; Mitotic rate: < 2 mitosis in 10 hpf and < 3% KI-67 index   #Chronic mild anemia microcytic-baseline-11-12; June September 2020-9; microcytic; [multiple prior EGD/colonoscopies; [facility]]; no bone marrow biopsy; October 2020 EGD colonoscopy-no acute bleeding etiology [Dr.Toledo]  # DM/active smoker/remote history of TIA   Malignant carcinoid tumor of duodenum (HCC)  07/05/2019 Initial Diagnosis   Malignant carcinoid tumor of duodenum (HCC)      HISTORY OF PRESENTING ILLNESS: Obese.  Walks with a rolling walker.  Accompanied by family.  Lynn Potter 78 y.o.  female is here for follow-up of anemia-iron deficiency question etiology is here for follow-up.  She has been getting injections in her left eye due to blood seen in eye exam, sees Dr. Bing Plume in [place].   Patient states no concerns at the moment.  Patient denies any blood in stools or black-colored stools.  She is compliant with oral iron pills.  Review of Systems  Constitutional:  Positive for malaise/fatigue. Negative for chills, diaphoresis, fever and weight loss.  HENT:  Negative for nosebleeds and sore throat.   Eyes:  Negative for double vision.  Respiratory:  Negative for cough, hemoptysis, sputum production, shortness of breath and wheezing.   Cardiovascular:  Negative for chest pain, palpitations, orthopnea and leg swelling.  Gastrointestinal:  Negative for abdominal pain, blood in stool, constipation, diarrhea, heartburn,  melena, nausea and vomiting.  Genitourinary:  Negative for dysuria, frequency and urgency.  Musculoskeletal:  Positive for back pain and joint pain.  Skin: Negative.  Negative for itching and rash.  Neurological:  Negative for dizziness, tingling, focal weakness, weakness and headaches.  Endo/Heme/Allergies:  Does not bruise/bleed easily.  Psychiatric/Behavioral:  Negative for depression. The patient is not nervous/anxious and does not have insomnia.     MEDICAL HISTORY:  Past Medical History:  Diagnosis Date   AF (atrial fibrillation) (HCC)    a.) CHA2DS2-VASc: 34 (age x2, sex, HTN, vascular disease history, T2DM); b.) rate/rhythm maintained intrinsically without pharmacological intervention; no chronic oral anticoagulation   Aortic atherosclerosis (HCC)    Arthritis    Asthma    B12 deficiency    Bilateral carpal tunnel syndrome    CAD (coronary artery disease)    Cerebral microvascular disease    Chicken pox    Clotting disorder (HCC)    COPD (chronic obstructive pulmonary disease) (HCC)    DDD (degenerative disc disease), lumbosacral    a.) s/p L4-S1 fusion in 2006   Depression    Diabetic peripheral neuropathy (HCC)    Diastolic dysfunction 06/06/2022   a.) TTE 06/06/2022: EF >55%, no RWMAs, mild LVH, triv MR/TR, mild AR, RVSP 20.5, G1DD   Diverticulosis    DOE (dyspnea on exertion)    GERD (gastroesophageal reflux disease)    Glaucoma    Hepatic steatosis    HH (hiatus hernia)    History of kidney stones    HLD (hyperlipidemia)    Hypertension    IDA (iron deficiency anemia)    Insomnia    a.) uses trazodone PRN   Internal hemorrhoids 12/25/2014   LAFB (left  anterior fascicular block)    Long term current use of aspirin    Lumbar spinal stenosis    Migraines    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   a.) Bx 10/18/2020 - spindle cell neoplasm; b.) s/p partial mastectomy 12/06/2020 --> final pathology revealed estrogen receptor (+) myofibroblastoma    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; well differentiated --> s/p bowel resection   Obesity    OSA (obstructive sleep apnea)    a.) non-compliant with prescribed nocturnal PAP therapy   Personal history of tobacco use, presenting hazards to health 06/18/2015   RLS (restless legs syndrome)    a.) on ropinirole   Status post carpal tunnel release    T2DM (type 2 diabetes mellitus) (HCC)    Tubular adenoma of colon     SURGICAL HISTORY: Past Surgical History:  Procedure Laterality Date   ABDOMINAL HYSTERECTOMY N/A 1982   APPENDECTOMY     BACK SURGERY  2002   CARPAL TUNNEL RELEASE     CATARACT EXTRACTION  2006   CHOLECYSTECTOMY     COLONOSCOPY WITH ESOPHAGOGASTRODUODENOSCOPY (EGD)     COLONOSCOPY WITH PROPOFOL N/A 08/03/2019   Procedure: COLONOSCOPY WITH PROPOFOL;  Surgeon: Toledo, Boykin Nearing, MD;  Location: [facility] ENDOSCOPY;  Service: Gastroenterology;  Laterality: N/A;   ESOPHAGOGASTRODUODENOSCOPY (EGD) WITH PROPOFOL N/A 02/16/2017   Procedure: ESOPHAGOGASTRODUODENOSCOPY (EGD) WITH PROPOFOL;  Surgeon: Scot Jun, MD;  Location: [facility] ENDOSCOPY;  Service: Endoscopy;  Laterality: N/A;   ESOPHAGOGASTRODUODENOSCOPY (EGD) WITH PROPOFOL N/A 08/03/2019   Procedure: ESOPHAGOGASTRODUODENOSCOPY (EGD) WITH PROPOFOL;  Surgeon: Toledo, Boykin Nearing, MD;  Location: [facility] ENDOSCOPY;  Service: Gastroenterology;  Laterality: N/A;   LUMBAR FUSION  2006   MASTECTOMY PARTIAL / LUMPECTOMY Right 12/06/2020   REPAIR ROTATOR CUFF TEAR     TOTAL HIP ARTHROPLASTY Left 06/04/2023   Procedure: TOTAL HIP ARTHROPLASTY ANTERIOR APPROACH;  Surgeon: Reinaldo Berber, MD;  Location: [facility] ORS;  Service: Orthopedics;  Laterality: Left;   TOTAL KNEE ARTHROPLASTY Bilateral 1999   UPPER ESOPHAGEAL ENDOSCOPIC ULTRASOUND (EUS) N/A 01/03/2016   Procedure: UPPER ESOPHAGEAL ENDOSCOPIC ULTRASOUND (EUS);  Surgeon: Bearl Mulberry, MD;  Location: [facility] ENDOSCOPY;  Service: Gastroenterology;   Laterality: N/A;    SOCIAL HISTORY: Social History   Socioeconomic History   Marital status: Single    Spouse name: Not on file   Number of children: Not on file   Years of education: Not on file   Highest education level: Not on file  Occupational History   Not on file  Tobacco Use   Smoking status: Every Day    Current packs/day: 0.40    Types: Cigarettes   Smokeless tobacco: Never  Vaping Use   Vaping status: Never Used  Substance and Sexual Activity   Alcohol use: No   Drug use: No   Sexual activity: Not on file  Other Topics Concern   Not on file  Social History Narrative   Lives in [place], lives alone. Rolling walker sec to back/hip pain. Used work in [employer]. Smoke ~1ppd/>50; No alcohol.    Social Drivers of Corporate investment banker Strain: Not on file  Food Insecurity: No Food Insecurity (06/04/2023)   Hunger Vital Sign    Worried About Running Out of Food in the Last Year: Never true    Ran Out of Food in the Last Year: Never true  Transportation Needs: No Transportation Needs (06/04/2023)   PRAPARE - Administrator, Civil Service (Medical):  No    Lack of Transportation (Non-Medical): No  Physical Activity: Not on file  Stress: Not on file  Social Connections: Not on file  Intimate Partner Violence: Not At Risk (06/04/2023)   Humiliation, Afraid, Rape, and Kick questionnaire    Fear of Current or Ex-Partner: No    Emotionally Abused: No    Physically Abused: No    Sexually Abused: No    FAMILY HISTORY: Family History  Problem Relation Age of Onset   Diabetes Mother    Diabetes Sister     ALLERGIES:  is allergic to penicillins.  MEDICATIONS:  Current Outpatient Medications  Medication Sig Dispense Refill   acetaminophen (TYLENOL) 500 MG tablet Take 2 tablets (1,000 mg total) by mouth every 8 (eight) hours. 30 tablet 0   Ascorbic Acid (VITAMIN C) 1000 MG tablet Take 1,000 mg by mouth every morning.     aspirin 81 MG tablet Take  81 mg by mouth daily.     atorvastatin (LIPITOR) 40 MG tablet Take 40 mg by mouth daily.     calcium carbonate (OSCAL) 1500 (600 Ca) MG TABS tablet Take 600 mg of elemental calcium by mouth daily with breakfast.     COMBIVENT RESPIMAT 20-100 MCG/ACT AERS respimat USE 1 VIAL VIA NEBULIZER 4 TIMES A DAY AS NEEDED     diphenhydrAMINE (BENADRYL) 25 mg capsule Take 1 capsule (25 mg total) by mouth every 6 (six) hours as needed for itching.     docusate sodium (COLACE) 100 MG capsule Take 1 capsule (100 mg total) by mouth 2 (two) times daily.     DULoxetine (CYMBALTA) 30 MG capsule Take 30 mg by mouth every morning.     Fe Bisgly-Vit C-Vit B12-FA 28-60-0.008-0.4 MG CAPS Take 1 tablet by mouth daily. If stomach upset recommend taking with food. 90 capsule 3   feeding supplement (ENSURE ENLIVE / ENSURE PLUS) LIQD Take 237 mLs by mouth 2 (two) times daily between meals. 237 mL 12   gabapentin (NEURONTIN) 300 MG capsule Take 300 mg by mouth 3 (three) times daily.     insulin lispro (HUMALOG) 100 UNIT/ML KwikPen Inject 3 Units into the skin 3 (three) times daily.      JARDIANCE 25 MG TABS tablet Take 25 mg by mouth daily.      latanoprost (XALATAN) 0.005 % ophthalmic solution Place 1 drop into both eyes at bedtime.      LEVEMIR FLEXTOUCH 100 UNIT/ML Pen Inject 25 Units into the skin daily. Patient reports she only takes Levemir if her glucose is >150 mg/dl which is rare for her  5   metFORMIN (GLUCOPHAGE) 500 MG tablet Take 500 mg by mouth 2 (two) times daily with a meal.     olmesartan-hydrochlorothiazide (BENICAR HCT) 40-25 MG tablet Take 1 tablet by mouth daily.     Omega-3 Fatty Acids (FISH OIL) 1000 MG CAPS Take by mouth daily.     ondansetron (ZOFRAN) 4 MG tablet Take 1 tablet (4 mg total) by mouth every 6 (six) hours as needed for nausea.     pantoprazole (PROTONIX) 20 MG tablet Take 1 tablet (20 mg total) by mouth daily.     pantoprazole (PROTONIX) 40 MG tablet Take 1 tablet by mouth 2 (two) times  daily.     potassium chloride SA (KLOR-CON M) 20 MEQ tablet TAKE 1 TABLET BY MOUTH EVERY DAY 30 tablet 0   rOPINIRole (REQUIP) 1 MG tablet TAKE 1 TABLET BY MOUTH EVERYDAY AT BEDTIME  traZODone (DESYREL) 50 MG tablet      vitamin B-12 1000 MCG tablet Take 1 tablet (1,000 mcg total) by mouth daily. 30 tablet 1   enoxaparin (LOVENOX) 40 MG/0.4ML injection Inject 0.4 mLs (40 mg total) into the skin daily for 14 days.     No current facility-administered medications for this visit.      PHYSICAL EXAMINATION:   Vitals:   12/08/23 1513  BP: 129/71  Pulse: 86  Resp: (!) 24  Temp: 98.4 F (36.9 C)  SpO2: 100%    Filed Weights   12/08/23 1513  Weight: 175 lb 3.2 oz (79.5 kg)     Physical Exam HENT:     Head: Normocephalic and atraumatic.     Mouth/Throat:     Pharynx: No oropharyngeal exudate.  Eyes:     Pupils: Pupils are equal, round, and reactive to light.  Cardiovascular:     Rate and Rhythm: Normal rate and regular rhythm.  Pulmonary:     Effort: No respiratory distress.     Breath sounds: No wheezing.  Abdominal:     General: Bowel sounds are normal. There is no distension.     Palpations: Abdomen is soft. There is no mass.     Tenderness: There is no abdominal tenderness. There is no guarding or rebound.  Musculoskeletal:        General: No tenderness. Normal range of motion.     Cervical back: Normal range of motion and neck supple.  Skin:    General: Skin is warm.  Neurological:     Mental Status: She is alert and oriented to person, place, and time.  Psychiatric:        Mood and Affect: Affect normal.     LABORATORY DATA:  I have reviewed the data as listed Lab Results  Component Value Date   WBC 6.4 12/08/2023   HGB 13.2 12/08/2023   HCT 40.5 12/08/2023   MCV 82.5 12/08/2023   PLT 271 12/08/2023   Recent Labs    05/22/23 1119 06/05/23 0547 06/07/23 0627 06/07/23 1051 06/09/23 0829 08/11/23 1336 12/08/23 1505  NA 137   < > 127*   < >  134* 131* 134*  K 3.3*   < > 3.1*   < > 3.6 2.9* 3.4*  CL 104   < > 91*   < > 98 97* 98  CO2 25   < > 27   < > 26 24 26   GLUCOSE 118*   < > 128*   < > 124* 98 110*  BUN <5*   < > 8   < > 7* <5* 10  CREATININE 0.72   < > 0.56   < > 0.49 0.80 0.77  CALCIUM 9.7   < > 8.5*   < > 8.8* 9.2 9.5  GFRNONAA >60   < > >60   < > >60 >60 >60  PROT 7.3  --  5.9*  --   --  7.3  --   ALBUMIN 3.6  --  2.4*  --   --  3.6  --   AST 23  --  18  --   --  20  --   ALT 19  --  17  --   --  11  --   ALKPHOS 67  --  62  --   --  75  --   BILITOT 0.8  --  1.3*  --   --  0.9  --    < > =  values in this interval not displayed.     No results found.   Microcytic anemia #Iron deficiency anemia-currently on p.o. iron. DECLINES Venofer infusion. Hemoglobin today is 13 -Continue p.o. iron every day.   #Etiology of iron deficiency-unclear.  October 2020-EGD colonoscopy no obvious etiology of bleeding noted.   # NET remote history-stage I.  Low risk of recurrence.  S/p bowel re-section; clinically no dense of recurrence.  Stable.   # Smoking: counseled  quitting smoking; APRIL- 2024- lung cancer screeing-NEGATIVE; continue surveillance imaging per PCP.   # DISPOSITION:  # no venofer # follow up in 12 months - MD; labs- cbc/cmp; iron stuides.ferritin; possible venofer Dr.B   All questions were answered. The patient knows to call the clinic with any problems, questions or concerns.    Earna Coder, MD 12/08/2023 3:50 PM

## 2024-01-29 ENCOUNTER — Encounter: Payer: Self-pay | Admitting: Podiatry

## 2024-01-29 ENCOUNTER — Ambulatory Visit (INDEPENDENT_AMBULATORY_CARE_PROVIDER_SITE_OTHER): Payer: 59 | Admitting: Podiatry

## 2024-01-29 ENCOUNTER — Encounter: Payer: Self-pay | Admitting: Internal Medicine

## 2024-01-29 VITALS — Ht 65.0 in | Wt 175.2 lb

## 2024-01-29 DIAGNOSIS — M79675 Pain in left toe(s): Secondary | ICD-10-CM | POA: Diagnosis not present

## 2024-01-29 DIAGNOSIS — E119 Type 2 diabetes mellitus without complications: Secondary | ICD-10-CM

## 2024-01-29 DIAGNOSIS — M79674 Pain in right toe(s): Secondary | ICD-10-CM

## 2024-01-29 DIAGNOSIS — B351 Tinea unguium: Secondary | ICD-10-CM

## 2024-01-29 DIAGNOSIS — E1142 Type 2 diabetes mellitus with diabetic polyneuropathy: Secondary | ICD-10-CM

## 2024-02-05 NOTE — Progress Notes (Signed)
 ANNUAL DIABETIC FOOT EXAM  Subjective: Lynn Potter presents today for annual diabetic foot exam. Chief Complaint  Patient presents with   Nail Problem    Pt is here for [facility] last A1C was 5 PCP is Dr Rozetta Corns and LOV was in December.  Patient confirms h/o diabetes.  Patient denies any h/o foot wounds.  Patient has been diagnosed with neuropathy.  Lynn Degree, Lynn Potter is patient's PCP.  Past Medical History:  Diagnosis Date   AF (atrial fibrillation) (HCC)    a.) CHA2DS2-VASc: 78 (age x2, sex, HTN, vascular disease history, T2DM); b.) rate/rhythm maintained intrinsically without pharmacological intervention; no chronic oral anticoagulation   Aortic atherosclerosis (HCC)    Arthritis    Asthma    B12 deficiency    Bilateral carpal tunnel syndrome    CAD (coronary artery disease)    Cerebral microvascular disease    Chicken pox    Clotting disorder (HCC)    COPD (chronic obstructive pulmonary disease) (HCC)    DDD (degenerative disc disease), lumbosacral    a.) s/p L4-S1 fusion in 2006   Depression    Diabetic peripheral neuropathy (HCC)    Diastolic dysfunction 06/06/2022   a.) TTE 06/06/2022: EF >55%, no RWMAs, mild LVH, triv MR/TR, mild AR, RVSP 20.5, G1DD   Diverticulosis    DOE (dyspnea on exertion)    GERD (gastroesophageal reflux disease)    Glaucoma    Hepatic steatosis    HH (hiatus hernia)    History of kidney stones    HLD (hyperlipidemia)    Hypertension    IDA (iron  deficiency anemia)    Insomnia    a.) uses trazodone  PRN   Internal hemorrhoids 12/25/2014   LAFB (left anterior fascicular block)    Long term current use of aspirin     Lumbar spinal stenosis    Migraines    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   a.) Bx 10/18/2020 - spindle cell neoplasm; b.) s/p partial mastectomy 12/06/2020 --> final pathology revealed estrogen receptor (+) myofibroblastoma   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; well  differentiated --> s/p bowel resection   Obesity    OSA (obstructive sleep apnea)    a.) non-compliant with prescribed nocturnal PAP therapy   Personal history of tobacco use, presenting hazards to health 06/18/2015   RLS (restless legs syndrome)    a.) on ropinirole    Status post carpal tunnel release    T2DM (type 2 diabetes mellitus) (HCC)    Tubular adenoma of colon    Patient Active Problem List   Diagnosis Date Noted   Encephalopathy 06/07/2023   Hyponatremia 06/07/2023   Atrial fibrillation, chronic (HCC) 06/07/2023   Hyperlipidemia 06/07/2023   Osteoarthritis of left hip 06/04/2023   Abnormal ECG 05/28/2022   Atherosclerosis of abdominal aorta (HCC) 05/28/2022   Benign essential HTN 05/28/2022   Coronary artery disease involving native coronary artery of native heart 05/28/2022   SOBOE (shortness of breath on exertion) 05/28/2022   Bilateral carpal tunnel syndrome 04/24/2021   Grade 1 Anterolisthesis of lumbar spine (2mm) (L3-4) 07/17/2020   Thyroid  nodule 08/18/2019   Iron  deficiency anemia due to chronic blood loss 07/23/2019   SIRS (systemic inflammatory response syndrome) (HCC) 07/21/2019   HTN (hypertension) 07/21/2019   Diabetes (HCC) 07/21/2019   OSA (obstructive sleep apnea) 07/21/2019   AKI (acute kidney injury) (HCC) 07/21/2019   Microcytic anemia 07/05/2019   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  Preop testing 03/03/2019   Abnormal MRI, lumbar spine (09/26/18) 12/15/2018   Spondylosis without myelopathy or radiculopathy, lumbosacral region 12/02/2018   Chronic low back pain (1ry area of Pain) (Bilateral) (L>R) w/o sciatica 12/02/2018   Abnormal CT of  lumbar spine (10/11/2018) 12/02/2018   Lumbar facet hypertrophy (Multilevel) 12/02/2018   DDD (degenerative disc disease), lumbar 12/02/2018   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), w/ neurogenic claudication 12/02/2018   Osteoarthritis of  facet joint of lumbar spine 12/02/2018   Osteoarthritis of lumbar spine 12/02/2018   Lumbar spondylosis 12/02/2018   Chronic hip pain (Left) 11/29/2018   Osteoarthritis of hip (Left) 11/29/2018   Lumbar facet syndrome (Bilateral) (L>R) 11/29/2018   Vitamin B12 deficiency 11/29/2018   Elevated C-reactive protein (CRP) 11/15/2018   Elevated sed rate 11/15/2018   Chronic low back pain (1ry area of Pain) (Bilateral) (L>R) w/ sciatica (Left) 11/09/2018   Chronic lower extremity pain (2ry area of Pain) (Left) 11/09/2018   Chronic shoulder pain (3ry area of Pain) (Bilateral) (R>L) 11/09/2018   Pharmacologic therapy 11/09/2018   Disorder of skeletal system 11/09/2018   Problems influencing health status 11/09/2018   Acute diverticulitis 05/23/2018   Hyperglycemia 07/28/2017   Vitamin D  insufficiency 03/25/2017   Chronic pain syndrome 03/16/2017   Encounter for long-term opiate analgesic use 03/16/2017   Long term current use of opiate analgesic 03/16/2017   Opiate use 03/16/2017   Degenerative joint disease (DJD) of hip 03/16/2017   Sacroiliac joint pain 03/16/2017   Sepsis (HCC) 09/30/2016   Osteoarthritis of shoulder (Right) 10/17/2015   Personal history of tobacco use, presenting hazards to health 06/18/2015   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   Rotator cuff tendinitis 03/10/2014   History of rotator cuff surgery (Bilateral) 03/10/2014   Past Surgical History:  Procedure Laterality Date   ABDOMINAL HYSTERECTOMY N/A 1982   APPENDECTOMY     BACK SURGERY  2002   CARPAL TUNNEL RELEASE     CATARACT EXTRACTION  2006   CHOLECYSTECTOMY     COLONOSCOPY WITH ESOPHAGOGASTRODUODENOSCOPY (EGD)     COLONOSCOPY WITH PROPOFOL  N/A 08/03/2019   Procedure: COLONOSCOPY WITH PROPOFOL ;  Surgeon: Toledo, Alphonsus Jeans, MD;  Location: [facility] ENDOSCOPY;  Service: Gastroenterology;  Laterality: N/A;   ESOPHAGOGASTRODUODENOSCOPY (EGD) WITH PROPOFOL  N/A 02/16/2017   Procedure:  ESOPHAGOGASTRODUODENOSCOPY (EGD) WITH PROPOFOL ;  Surgeon: Cassie Click, MD;  Location: [facility] END[facility];  Service: Endoscopy;  Laterality: N/A;   ESOPHAGOGASTRODUODENOSCOPY (EGD) WITH PROPOFOL  N/A 08/03/2019   Procedure: ESOPHAGOGASTRODUODENOSCOPY (EGD) WITH PROPOFOL ;  Surgeon: Toledo, Alphonsus Jeans, MD;  Location: [facility] ENDOSCOPY;  Service: Gastroenterology;  Laterality: N/A;   LUMBAR FUSION  2006   MASTECTOMY PARTIAL / LUMPECTOMY Right 12/06/2020   REPAIR ROTATOR CUFF TEAR     TOTAL HIP ARTHROPLASTY Left 06/04/2023   Procedure: TOTAL HIP ARTHROPLASTY ANTERIOR APPROACH;  Surgeon: Venus Ginsberg, MD;  Location: [facility] ORS;  Service: Orthopedics;  Laterality: Left;   TOTAL KNEE ARTHROPLASTY Bilateral 1999   UPPER ESOPHAGEAL ENDOSCOPIC ULTRASOUND (EUS) N/A 01/03/2016   Procedure: UPPER ESOPHAGEAL ENDOSCOPIC ULTRASOUND (EUS);  Surgeon: Eloisa Hait, MD;  Location: [facility] ENDOSCOPY;  Service: Gastroenterology;  Laterality: N/A;   Current Outpatient Medications on File Prior to Visit  Medication Sig Dispense Refill   acetaminophen  (TYLENOL ) 500 MG tablet Take 2 tablets (1,000 mg total) by mouth every 8 (eight) hours. 30 tablet 0   Ascorbic Acid  (VITAMIN C ) 1000 MG tablet Take 1,000 mg by mouth every morning.  aspirin  81 MG tablet Take 81 mg by mouth daily.     atorvastatin  (LIPITOR) 40 MG tablet Take 40 mg by mouth daily.     calcium  carbonate (OSCAL) 1500 (600 Ca) MG TABS tablet Take 600 mg of elemental calcium  by mouth daily with breakfast.     COMBIVENT RESPIMAT 20-100 MCG/ACT AERS respimat USE 1 VIAL VIA NEBULIZER 4 TIMES A DAY AS NEEDED     diphenhydrAMINE  (BENADRYL ) 25 mg capsule Take 1 capsule (25 mg total) by mouth every 6 (six) hours as needed for itching.     docusate sodium  (COLACE) 100 MG capsule Take 1 capsule (100 mg total) by mouth 2 (two) times daily.     DULoxetine  (CYMBALTA ) 30 MG capsule Take 30 mg by mouth every morning.     Fe Bisgly-Vit C-Vit B12-FA 28-60-0.008-0.4 MG  CAPS Take 1 tablet by mouth daily. If stomach upset recommend taking with food. 90 capsule 3   feeding supplement (ENSURE ENLIVE / ENSURE PLUS) LIQD Take 237 mLs by mouth 2 (two) times daily between meals. 237 mL 12   gabapentin  (NEURONTIN ) 300 MG capsule Take 300 mg by mouth 3 (three) times daily.     insulin  lispro (HUMALOG) 100 UNIT/ML KwikPen Inject 3 Units into the skin 3 (three) times daily.      JARDIANCE 25 MG TABS tablet Take 25 mg by mouth daily.      latanoprost  (XALATAN ) 0.005 % ophthalmic solution Place 1 drop into both eyes at bedtime.      LEVEMIR  FLEXTOUCH 100 UNIT/ML Pen Inject 25 Units into the skin daily. Patient reports she only takes Levemir  if her glucose is >150 mg/dl which is rare for her  5   metFORMIN  (GLUCOPHAGE ) 500 MG tablet Take 500 mg by mouth 2 (two) times daily with a meal.     olmesartan -hydrochlorothiazide  (BENICAR  HCT) 40-25 MG tablet Take 1 tablet by mouth daily.     Omega-3 Fatty Acids (FISH OIL) 1000 MG CAPS Take by mouth daily.     ondansetron  (ZOFRAN ) 4 MG tablet Take 1 tablet (4 mg total) by mouth every 6 (six) hours as needed for nausea.     pantoprazole  (PROTONIX ) 20 MG tablet Take 1 tablet (20 mg total) by mouth daily.     pantoprazole  (PROTONIX ) 40 MG tablet Take 1 tablet by mouth 2 (two) times daily.     potassium chloride  SA (KLOR-CON  M) 20 MEQ tablet TAKE 1 TABLET BY MOUTH EVERY DAY 90 tablet 1   rOPINIRole  (REQUIP ) 1 MG tablet TAKE 1 TABLET BY MOUTH EVERYDAY AT BEDTIME     traZODone  (DESYREL ) 50 MG tablet      vitamin B-12 1000 MCG tablet Take 1 tablet (1,000 mcg total) by mouth daily. 30 tablet 1   enoxaparin  (LOVENOX ) 40 MG/0.4ML injection Inject 0.4 mLs (40 mg total) into the skin daily for 14 days.     No current facility-administered medications on file prior to visit.    Allergies  Allergen Reactions   Penicillins Hives    Patient reported    Social History   Occupational History   Not on file  Tobacco Use   Smoking status: Every  Day    Current packs/day: 0.40    Types: Cigarettes   Smokeless tobacco: Never  Vaping Use   Vaping status: Never Used  Substance and Sexual Activity   Alcohol use: No   Drug use: No   Sexual activity: Not on file   Family History  Problem Relation Age  of Onset   Diabetes Mother    Diabetes Sister    Immunization History  Administered Date(s) Administered   Influenza, High Dose Seasonal PF 07/14/2018   PFIZER(Purple Top)SARS-COV-2 Vaccination 11/07/2019, 12/01/2019     Review of Systems: Negative except as noted in the HPI.   Objective: There were no vitals filed for this visit.  Lynn Potter is a pleasant 78 y.o. female in NAD. AAO X 3.  Diabetic foot exam was performed with the following findings:   Vascular Examination: Capillary refill time immediate b/l. Vascular status intact b/l with palpable pedal pulses. Pedal hair present b/l. No pain with calf compression b/l. Skin temperature gradient WNL b/l. No cyanosis or clubbing b/l. No ischemia or gangrene noted b/l.   Neurological Examination: Sensation grossly intact b/l with 10 gram monofilament. Vibratory sensation intact b/l. Pt has subjective symptoms of neuropathy.  Dermatological Examination: Pedal skin with normal turgor, texture and tone b/l.  No open wounds. No interdigital macerations.   Toenails 1-5 b/l thick, discolored, elongated with subungual debris and pain on dorsal palpation.   No corns, calluses nor porokeratotic lesions noted.  Musculoskeletal Examination: Muscle strength 5/5 to all lower extremity muscle groups bilaterally. No pain, crepitus or joint limitation noted with ROM bilateral LE. No gross bony deformities bilaterally.  Radiographs: None     Lab Results  Component Value Date   HGBA1C 6.4 (H) 07/21/2019   ADA Risk Categorization: Low Risk :  Patient has all of the following: Intact protective sensation No prior foot ulcer  No severe deformity Pedal pulses  present  Assessment: 1. Pain due to onychomycosis of toenails of both feet   2. Diabetic peripheral neuropathy associated with type 2 diabetes mellitus (HCC)   3. Encounter for diabetic foot exam (HCC)     Plan: Diabetic foot examination performed today. All patient's and/or POA's questions/concerns addressed on today's visit. Toenails 1-5 debrided in length and girth without incident. Continue foot and shoe inspections daily. Monitor blood glucose per PCP/Endocrinologist's recommendations. Continue soft, supportive shoe gear daily. Report any pedal injuries to medical professional. Call office if there are any questions/concerns. -Patient/POA to call should there be question/concern in the interim. Return in about 3 months (around 04/29/2024).  Luella Sager, DPM      [facility] LOCATION: [address], [place] [address]                   Office [phone]   [facility] LOCATION: [address] [place], [place] [address] Office [phone]

## 2024-02-25 ENCOUNTER — Encounter: Payer: Self-pay | Admitting: Internal Medicine

## 2024-03-02 ENCOUNTER — Encounter: Payer: Self-pay | Admitting: Internal Medicine

## 2024-03-03 ENCOUNTER — Ambulatory Visit: Admission: RE | Admit: 2024-03-03 | Source: Ambulatory Visit

## 2024-04-02 ENCOUNTER — Encounter: Payer: Self-pay | Admitting: Internal Medicine

## 2024-04-05 ENCOUNTER — Encounter: Payer: Self-pay | Admitting: Internal Medicine

## 2024-04-19 ENCOUNTER — Ambulatory Visit
Admission: RE | Admit: 2024-04-19 | Discharge: 2024-04-19 | Disposition: A | Discharge status: Home / Self Care | Source: Ambulatory Visit | Attending: Orthopedic Surgery | Admitting: Orthopedic Surgery

## 2024-04-19 DIAGNOSIS — M5416 Radiculopathy, lumbar region: Secondary | ICD-10-CM

## 2024-04-19 DIAGNOSIS — M48061 Spinal stenosis, lumbar region without neurogenic claudication: Secondary | ICD-10-CM

## 2024-04-19 DIAGNOSIS — M47816 Spondylosis without myelopathy or radiculopathy, lumbar region: Secondary | ICD-10-CM

## 2024-04-19 DIAGNOSIS — Z981 Arthrodesis status: Secondary | ICD-10-CM | POA: Diagnosis present

## 2024-04-22 ENCOUNTER — Encounter: Payer: Self-pay | Admitting: Orthopedic Surgery

## 2024-04-22 ENCOUNTER — Ambulatory Visit (INDEPENDENT_AMBULATORY_CARE_PROVIDER_SITE_OTHER): Admitting: Orthopedic Surgery

## 2024-04-22 DIAGNOSIS — Z981 Arthrodesis status: Secondary | ICD-10-CM | POA: Diagnosis not present

## 2024-04-22 DIAGNOSIS — M48061 Spinal stenosis, lumbar region without neurogenic claudication: Secondary | ICD-10-CM | POA: Diagnosis not present

## 2024-04-22 DIAGNOSIS — M47816 Spondylosis without myelopathy or radiculopathy, lumbar region: Secondary | ICD-10-CM

## 2024-04-22 DIAGNOSIS — M5416 Radiculopathy, lumbar region: Secondary | ICD-10-CM

## 2024-04-22 DIAGNOSIS — M4726 Other spondylosis with radiculopathy, lumbar region: Secondary | ICD-10-CM

## 2024-04-22 NOTE — Progress Notes (Signed)
 Telephone Visit- Progress Note: Referring Physician:  Donal Channing SQUIBB, FNP [address] [place],  [place] [address]  Primary Physician:  Donal Channing SQUIBB, FNP  This visit was performed via telephone.  Patient location: home Provider location: office  I spent a total of 15 minutes non-face-to-face activities for this visit on the date of this encounter including review of current clinical condition and response to treatment.    Patient has given verbal consent to this telephone visits and we reviewed the limitations of a telephone visit. Patient wishes to proceed.    Chief Complaint:  review imaging.   History of Present Illness: Lynn Potter is a 78 y.o. female has a history of breast CA, HTN, afib, CAD, malignant carcinoid tumor of duodenum, DM, chronic pain, hyperlipidemia, OSA, and failed back surgical syndrome.    History of lumbar surgery in 2002 and lumbar fusion in 2006. She did see improvement after this for a few years.   Last seen by me on 10/29/23 for back and left leg pain. She was sent to PT and phone visit scheduled to review her updated lumbar imaging.    She has constant LBP with bilateral leg pain that is more medial and lateral to her feet. LBP = leg pain, left leg pain = right leg pain. She has numbness, tingling, and weakness in both legs. Pain is worse with walking. Some relief shopping cart. Some improvement with stopping to sit.    She is taking tylneol, cymbalta , neurontin .    She smokes 6-7 cigarettes per day x 60 years.     Last HgbA1c on 10/15/22 was 6.5.    Bowel/Bladder Dysfunction: some bladder and bowel urgency x 2 years that is unchanged. No incontinence. No perineal numbness.    Conservative measures:  Physical therapy: years ago, nothing recent.  Multimodal medical therapy including regular antiinflammatories: tylenol , cymbalta , neurontin   Injections:  Left L3 TF and right L2-L3 ESI 09/25/20 Left L3 TF and right L2-L3 ESI  09/05/20 Right L2-L3 IL ESI 08/21/20 Left L3 TF and right L2-L3 ESI on 07/26/20   Past Surgery:  Lumbar fusion 2006 Lumbar surgery 2002  Exam: No exam done as this was a telephone encounter.     Imaging: Lumbar xrays dated 04/19/24:  FINDINGS: Four views on 04/19/2024.  Normal lumbar segmentation.   Previous L4-L5 and L5-S1 posterior and interbody fusion. Hardware appears stable since 2021. Incidental interval left total hip arthroplasty since that time. Chronic adjacent segment L3-L4 disc space loss, grade 1 anterolisthesis. Maintained vertebral height since 2021. No acute osseous abnormality identified.   Lateral views in neutral, flexion, and extension. No abnormal motion identified, decreased range of motion across the three views.   Calcified aortic atherosclerosis. Chronic lower abdominal or pelvic hernia repair with mesh. Nonobstructed bowel-gas pattern.   IMPRESSION: 1. Stable radiographic appearance of L4 through S1 decompression and fusion since 2021. 2. L3-L4 adjacent segment degeneration with chronic disc space loss and grade 1 anterolisthesis. 3. No abnormal motion with flexion/extension. 4. Interval left total hip arthroplasty. 5.  Aortic Atherosclerosis (ICD10-I70.0).     Electronically Signed   By: VEAR Hurst M.D.   On: 04/20/2024 09:02    Lumbar MRI dated 04/19/24:  FINDINGS: Segmentation:  Normal on the comparison radiographs.   Alignment: Grade 1 anterolisthesis of L3 on L4 appears mildly increased since 2020. Stable L4 through S1 postoperative alignment. Degenerative upper lumbar and lower thoracic retrolisthesis including T12-L1 and L1-L2 are increased since 2021.   Vertebrae:  Mild chronic hardware susceptibility artifact from L4 to the sacrum. Normal background bone marrow signal. Stable vertebral height since 2020. Chronic degenerative endplate spurring and marrow signal changes. Mild degenerative marrow edema at the L1 anterior inferior  endplate. Intact visible sacrum, SI joints, and no other marrow edema or evidence of acute osseous abnormality.   Conus medullaris and cauda equina: Conus extends to the L1 level. No lower spinal cord or conus signal abnormality.   Paraspinal and other soft tissues: Postoperative changes to the posterior paraspinal soft tissues with no adverse features. Stable and negative visible abdominal viscera.   Disc levels:   Generalized lower thoracic disc space loss, posterior disc bulging, and posterior element hypertrophy.   Subsequent mild multifactorial spinal stenosis at T11-T12 (series 5, image 8). No convincing spinal cord mass effect or signal abnormality there. Moderate to severe left T11 neural foraminal stenosis.   L1-L2: Disc space loss and retrolisthesis since 2020. mild facet and moderate ligament flavum hypertrophy has increased. New mild spinal stenosis here at the tip of the conus (series 8, image 8). Moderate bilateral L1 foraminal stenosis is new. This level has progressed.   L2-L3: Better maintained disc height since 2020. Circumferential disc bulge. Progressed and now moderate to severe facet and ligament flavum hypertrophy, with right side degenerative facet joint fluid (series 8, image 13). Increased and moderate spinal stenosis (same image). No asymmetric lateral recess narrowing. No significant foraminal stenosis.   L3-L4: Chronic disc space loss, circumferential disc osteophyte complex. Grade 1 anterolisthesis at this level which is more apparent on radiographs. Previous laminectomy but moderate to severe residual facet and ligament flavum hypertrophy. Severe chronic spinal and right greater than left lateral recess stenosis (descending L4 nerve level series 8, image 17) does appear progressed since the 2020 MRI. Severe left L3 neural foraminal stenosis also appears multifactorial and progressed (series 5, image 10). Moderate right L3 foraminal stenosis  appears more stable.   L4-L5:  Chronic decompression and fusion with no adverse features.   L5-S1:  Chronic decompression and fusion with no adverse features.   IMPRESSION: 1. Chronic L4 through S1 decompression and fusion.   2. Severe adjacent segment disease at L3-L4 with grade 1 anterolisthesis, severe disc space loss, bulky and severe posterior element hypertrophy. Subsequent Severe multifactorial spinal, right lateral recess, left neural foraminal stenosis appear progressed since 2020. Query Left L3 and/or right L4 radiculitis.   3. L1-L2 degeneration since 2020 with disc space loss and new retrolisthesis. Subsequent mild new spinal stenosis at the conus medullaris, no conus mass effect. And moderate new bilateral L1 foraminal stenosis.   4. L2-L3 stable disc but progressed posterior element degeneration since 2020 resulting in increased and moderate spinal stenosis there.   5. Lower thoracic degeneration with mild multifactorial spinal stenosis at T11-T12, with moderate to severe left T11 foraminal stenosis.     Electronically Signed   By: VEAR Hurst M.D.   On: 04/20/2024 09:11    I have personally reviewed the images and agree with the above interpretation.  Assessment and Plan: Ms. Mckee has history of lumbar surgery x 2 with fusion in 2006. She did well for a few years then pain started.    She has constant LBP with bilateral leg pain that is more medial and lateral to her feet. LBP = leg pain, left leg pain = right leg pain. She has numbness, tingling, and weakness in both legs. Pain is worse with walking. Some relief shopping cart. Some improvement with stopping  to sit.    She is fused L4-S1. She has mild central stenosis T11-T12 with moderate/severe left foraminal stenosis, retrolisthesis L1-L2 with mild central stenosis and moderate bilateral foraminal stenosis, moderate central stenosis L2-L3, slip L3-L4 with severe central stenosis along with lateral recess  stenosis and severe left/moderate right foraminal stenosis.   Most of her symptoms are likely from L3-L4.    Treatment options discussed with patient and following plan made:  - She does not want to repeat injections or consider any surgery options for her lumbar spine.  - PT orders to [facility] in [place].  - Referral to [facility] to discuss medical management of chronic pain.  - Follow up with me in 6-8 weeks for recheck.    Glade Boys PA-C Neurosurgery

## 2024-04-29 ENCOUNTER — Ambulatory Visit (INDEPENDENT_AMBULATORY_CARE_PROVIDER_SITE_OTHER): Admitting: Podiatry

## 2024-04-29 DIAGNOSIS — E1142 Type 2 diabetes mellitus with diabetic polyneuropathy: Secondary | ICD-10-CM | POA: Diagnosis not present

## 2024-04-29 DIAGNOSIS — M79675 Pain in left toe(s): Secondary | ICD-10-CM

## 2024-04-29 DIAGNOSIS — B351 Tinea unguium: Secondary | ICD-10-CM | POA: Diagnosis not present

## 2024-04-29 DIAGNOSIS — R600 Localized edema: Secondary | ICD-10-CM

## 2024-04-29 DIAGNOSIS — M79674 Pain in right toe(s): Secondary | ICD-10-CM

## 2024-04-29 NOTE — Patient Instructions (Signed)
 Purchase open toed below knee compression hose 8-15 mmHg from [employer]. Apply first thing in the morning. Remove before bedtime. Hand wash and hang up to dry.  You may purchase these on [employer]  Edema  Edema is an abnormal buildup of fluids in the body tissues and under the skin. Swelling of the legs, feet, and ankles is a common symptom that becomes more likely as you get older. Swelling is also common in looser tissues, such as around the eyes. Pressing on the area may make a temporary dent in your skin (pitting edema). This fluid may also accumulate in your lungs (pulmonary edema). There are many possible causes of edema. Eating too much salt (sodium) and being on your feet or sitting for a long time can cause edema in your legs, feet, and ankles. Common causes of edema include: Certain medical conditions, such as heart failure, liver or kidney disease, and cancer. Weak leg blood vessels. An injury. Pregnancy. Medicines. Being obese. Low protein levels in the blood. Hot weather may make edema worse. Edema is usually painless. Your skin may look swollen or shiny. Follow these instructions at home: Medicines Take over-the-counter and prescription medicines only as told by your health care provider. Your health care provider may prescribe a medicine to help your body get rid of extra water (diuretic). Take this medicine if you are told to take it. Eating and drinking Eat a low-salt (low-sodium) diet to reduce fluid as told by your health care provider. Sometimes, eating less salt may reduce swelling. Depending on the cause of your swelling, you may need to limit how much fluid you drink (fluid restriction). General instructions Raise (elevate) the injured area above the level of your heart while you are sitting or lying down. Do not sit still or stand for long periods of time. Do not wear tight clothing. Do not wear garters on your upper legs. Exercise your legs to get your circulation going.  This helps to move the fluid back into your blood vessels, and it may help the swelling go down. Wear compression stockings as told by your health care provider. These stockings help to prevent blood clots and reduce swelling in your legs. It is important that these are the correct size. These stockings should be prescribed by your health care provider to prevent possible injuries. If elastic bandages or wraps are recommended, use them as told by your health care provider. Contact a health care provider if: Your edema does not get better with treatment. You have heart, liver, or kidney disease and have symptoms of edema. You have sudden and unexplained weight gain. Get help right away if: You develop shortness of breath or chest pain. You cannot breathe when you lie down. You develop pain, redness, or warmth in the swollen areas. You have heart, liver, or kidney disease and suddenly get edema. You have a fever and your symptoms suddenly get worse. These symptoms may be an emergency. Get help right away. Call 911. Do not wait to see if the symptoms will go away. Do not drive yourself to the hospital. Summary Edema is an abnormal buildup of fluids in the body tissues and under the skin. Eating too much salt (sodium)and being on your feet or sitting for a long time can cause edema in your legs, feet, and ankles. Raise (elevate) the injured area above the level of your heart while you are sitting or lying down. Follow your health care provider's instructions about diet and how much fluid you  can drink. This information is not intended to replace advice given to you by your health care provider. Make sure you discuss any questions you have with your health care provider. Document Revised: 05/27/2021 Document Reviewed: 05/27/2021 Elsevier Patient Education  2024 [employer]  How to Use Compression Stockings  Compression stockings are elastic socks that help increase blood flow (circulation)  to the legs, decrease swelling in the legs, and reduce the chance of developing blood clots in the lower legs. Compression stockings squeeze or apply pressure to the legs. The stockings are graduated, meaning the highest amount of pressure occurs at the toes and it decreases going toward the upper part of the leg. This helps ensure proper circulation through the veins. Compression stockings are often used by people who: Are recovering from surgery. The stockings help prevent blood clots after surgery. Have poor circulation or swelling in their legs because of a medical condition, such as chronic venous insufficiency, venous stasis, or lymphedema. Have a history of getting blood clots in their legs. Have bulging (varicose) veins. Sit or stay in bed for long periods of time (immobilization). Stand for long periods of time and experience leg pain or fatigue. Follow instructions from your health care provider about how and when to wear your compression stockings. What are the risks? Generally, compression stockings are safe to wear. However, problems may occur for some people, such as: The stockings being ineffective at increasing the circulation to the legs, decreasing swelling in the legs, or reducing the chance of developing blood clots in the lower legs. Skin complications, including breaks in the skin, open wounds, blisters, or dermatitis. How to wear compression stockings Before you put on your compression stockings: Make sure that they are the correct size and degree of compression. If you do not know your size or required grade of compression, ask your health care provider and follow the manufacturer's instructions that come with the stockings. Be sure they are the appropriate length for your medical needs. Compression stockings come in different lengths, including knee high, thigh high, and even up to the waist. Make sure that the stockings are clean, dry, and in good condition. Check the  stockings for rips and tears. Do not put them on if they are ripped or torn. Put your stockings on first thing in the morning, before you get out of bed. Keep them on for as long as your health care provider advises. Most people are told to remove their compression stockings at the end of the day before bed. When you are wearing your stockings: Keep them as smooth as possible. Do not allow them to bunch up. It is especially important to prevent the stockings from bunching up around your toes or behind your knees. Make sure that the toe holes are underneath the toes and the heel patches are positioned at the heels. Do not roll the stockings downward and leave them rolled down. This can decrease blood flow to your legs. Change the stockings right away if they become wet or dirty or if they have a bad smell. If you have chronic leg wounds, make sure the wounds are properly covered or dressed before putting on your compression stockings. When you take off your stockings, check your legs and feet for: Open sores. Red spots or other areas of discoloration. Swelling. General tips Do not stop wearing compression stockings. Talk to your health care provider if your stockings feel too tight. Wash your stockings often with mild detergent in cold or  warm water. Also wash them whenever they get dirty or have a bad smell. Do not use bleach. Air-dry your stockings or dry them in a clothes dryer on low heat. It may be helpful to have two pairs so that you have a pair to wear while the other is being washed. Replace your stockings every 3-6 months. If skin moisturizing is part of your treatment plan, apply lotion or cream at night so that your skin will be dry when you put on the stockings in the morning. It is harder to put the stockings on when you have lotion on your legs or feet. Wear nonskid shoes or slip-resistant socks when walking while wearing compression stockings. If you have difficulty putting on or  taking off the compression stockings, ask your health care provider about devices that may help make this easier. Contact a health care provider and remove your stockings if: You have a prickling or tingling feeling in your feet or legs. You have new open sores, red spots, or other skin changes on your feet or legs. You have swelling or pain that gets worse. Get help right away if: You have shortness of breath or chest pain. Your heartbeat is fast or irregular. You have new swelling, pain, or warmth in your leg. You have numbness or tingling in your lower legs that does not get better after you take the stockings off. Your toes or feet are unusually cold or turn a bluish color. You feel light-headed or dizzy. These symptoms may represent a serious problem that is an emergency. Do not wait to see if the symptoms will go away. Get medical help right away. Call your local emergency services (911 in the [place] Do not drive yourself to the hospital. Summary Compression stockings are elastic socks that are worn to treat a variety of symptoms and medical conditions such as venous insufficiency, venous stasis, or lymphedema. Compression stockings help increase blood flow (circulation) to the legs, decrease swelling in the legs, and reduce the chance of developing blood clots in the lower legs. Follow instructions from your health care provider about how and when to wear your compression stockings. Do not stop wearing your compression stockings without talking to your health care provider first. This information is not intended to replace advice given to you by your health care provider. Make sure you discuss any questions you have with your health care provider. Document Revised: 03/14/2021 Document Reviewed: 03/14/2021 Elsevier Patient Education  2024 [employer].

## 2024-05-02 ENCOUNTER — Encounter: Payer: Self-pay | Admitting: Podiatry

## 2024-05-02 NOTE — Progress Notes (Signed)
  Subjective:  Patient ID: Lynn Potter, female    DOB: 1946-06-21,  MRN: [id number]  78 y.o. female presents to clinic with  at risk foot care with history of diabetic neuropathy and painful thick toenails that are difficult to trim. Pain interferes with ambulation. Aggravating factors include wearing enclosed shoe gear. Pain is relieved with periodic professional debridement. Patient has concern regarding edema of lower extremities.   Chief Complaint  Patient presents with   Nail Problem    Thick painful toenails, 3 month follow up      New problem(s): None   PCP is Donal Channing SQUIBB, FNP.  Allergies  Allergen Reactions   Penicillins Hives    Patient reported     Review of Systems: Negative except as noted in the HPI.   Objective:  Lynn Potter is a pleasant 78 y.o. female in NAD. AAO x 3.  Vascular Examination: Vascular status intact b/l with palpable pedal pulses. Pedal hair sparse. CFT immediate b/l. No edema. No pain with calf compression b/l. Skin temperature gradient WNL b/l. Nonpitting edema noted BLE.  Neurological Examination: Sensation grossly intact b/l with 10 gram monofilament. Vibratory sensation intact b/l. Pt has subjective symptoms of neuropathy.  Dermatological Examination: Pedal skin with normal turgor, texture and tone b/l. Toenails 1-5 b/l thick, discolored, elongated with subungual debris and pain on dorsal palpation. No hyperkeratotic lesions noted b/l.   Musculoskeletal Examination: Muscle strength 5/5 to b/l LE. No pain, crepitus or joint limitation noted with ROM bilateral LE. No gross bony deformities bilaterally.  Radiographs: None  Assessment:   1. Pain due to onychomycosis of toenails of both feet   2. Bilateral lower extremity edema   3. Diabetic peripheral neuropathy associated with type 2 diabetes mellitus (HCC)    Plan:  Consent given for treatment. Patient examined. All patient's and/or POA's questions/concerns addressed on today's  visit. Mycotic toenails 1-5 debrided in length and girth without incident. Continue foot and shoe inspections daily. Monitor blood glucose per PCP/Endocrinologist's recommendations.Continue soft, supportive shoe gear daily. Report any pedal injuries to medical professional. Call office if there are any quesitons/concerns. -For pedal edema, recommended open toed compression hose 8-15 mmHg, level knee high. Apply every morning and remove at bedtime. -Patient/POA to call should there be question/concern in the interim.  Return in about 3 months (around 07/30/2024).  Delon LITTIE Merlin, DPM      [facility] LOCATION: [address], [place] [address]                   Office [phone]   [facility] LOCATION: [address] [place], [place] [address] Office [phone]

## 2024-05-03 ENCOUNTER — Encounter: Payer: Self-pay | Admitting: Internal Medicine

## 2024-05-12 ENCOUNTER — Ambulatory Visit
Admission: EM | Admit: 2024-05-12 | Discharge: 2024-05-12 | Disposition: A | Discharge status: Home / Self Care | Attending: Emergency Medicine | Admitting: Emergency Medicine

## 2024-05-12 ENCOUNTER — Encounter: Payer: Self-pay | Admitting: Internal Medicine

## 2024-05-12 DIAGNOSIS — I10 Essential (primary) hypertension: Secondary | ICD-10-CM | POA: Diagnosis not present

## 2024-05-12 DIAGNOSIS — M109 Gout, unspecified: Secondary | ICD-10-CM | POA: Diagnosis not present

## 2024-05-12 MED ORDER — DICLOFENAC SODIUM 1 % EX GEL: 2.0000 g | Freq: Three times a day (TID) | CUTANEOUS | 0 refills | Status: AC | PRN

## 2024-05-12 NOTE — ED Provider Notes (Signed)
 [facility]    CSN: [id number] Arrival date & time: 05/12/24  1144      History   Chief Complaint Chief Complaint  Patient presents with   Hand Pain    HPI Lynn Potter is a 78 y.o. female.   78 year old female pt, Lynn Potter, presents to urgent care for evaluation of right hand pain/swelling x 2 days. Pt denies trauma, no  fall or insect bite, no fever. Pt is right hand dominant.  Pt has hx of gout, diabetes, HTN, neuropathy,DDD  The history is provided by the patient and a relative. No language interpreter was used.    Past Medical History:  Diagnosis Date   AF (atrial fibrillation) (HCC)    a.) CHA2DS2-VASc: 69 (age x2, sex, HTN, vascular disease history, T2DM); b.) rate/rhythm maintained intrinsically without pharmacological intervention; no chronic oral anticoagulation   Aortic atherosclerosis (HCC)    Arthritis    Asthma    B12 deficiency    Bilateral carpal tunnel syndrome    CAD (coronary artery disease)    Cerebral microvascular disease    Chicken pox    Clotting disorder (HCC)    COPD (chronic obstructive pulmonary disease) (HCC)    DDD (degenerative disc disease), lumbosacral    a.) s/p L4-S1 fusion in 2006   Depression    Diabetic peripheral neuropathy (HCC)    Diastolic dysfunction 06/06/2022   a.) TTE 06/06/2022: EF >55%, no RWMAs, mild LVH, triv MR/TR, mild AR, RVSP 20.5, G1DD   Diverticulosis    DOE (dyspnea on exertion)    GERD (gastroesophageal reflux disease)    Glaucoma    Hepatic steatosis    HH (hiatus hernia)    History of kidney stones    HLD (hyperlipidemia)    Hypertension    IDA (iron  deficiency anemia)    Insomnia    a.) uses trazodone  PRN   Internal hemorrhoids 12/25/2014   LAFB (left anterior fascicular block)    Long term current use of aspirin     Lumbar spinal stenosis    Migraines    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   a.) Bx 10/18/2020 - spindle cell neoplasm; b.) s/p partial mastectomy 12/06/2020  --> final pathology revealed estrogen receptor (+) myofibroblastoma   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; well differentiated --> s/p bowel resection   Obesity    OSA (obstructive sleep apnea)    a.) non-compliant with prescribed nocturnal PAP therapy   Personal history of tobacco use, presenting hazards to health 06/18/2015   RLS (restless legs syndrome)    a.) on ropinirole    Status post carpal tunnel release    T2DM (type 2 diabetes mellitus) (HCC)    Tubular adenoma of colon     Patient Active Problem List   Diagnosis Date Noted   Gouty arthritis of hand 05/12/2024   Encephalopathy 06/07/2023   Hyponatremia 06/07/2023   Atrial fibrillation, chronic (HCC) 06/07/2023   Hyperlipidemia 06/07/2023   Osteoarthritis of left hip 06/04/2023   Abnormal ECG 05/28/2022   Atherosclerosis of abdominal aorta (HCC) 05/28/2022   Elevated blood pressure reading in office with diagnosis of hypertension 05/28/2022   Coronary artery disease involving native coronary artery of native heart 05/28/2022   SOBOE (shortness of breath on exertion) 05/28/2022   Bilateral carpal tunnel syndrome 04/24/2021   Grade 1 Anterolisthesis of lumbar spine (2mm) (L3-4) 07/17/2020   Thyroid  nodule 08/18/2019   Iron  deficiency anemia due to chronic blood  loss 07/23/2019   SIRS (systemic inflammatory response syndrome) (HCC) 07/21/2019   HTN (hypertension) 07/21/2019   Diabetes (HCC) 07/21/2019   OSA (obstructive sleep apnea) 07/21/2019   AKI (acute kidney injury) (HCC) 07/21/2019   Microcytic anemia 07/05/2019   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   Preop testing 03/03/2019   Abnormal MRI, lumbar spine (09/26/18) 12/15/2018   Spondylosis without myelopathy or radiculopathy, lumbosacral region 12/02/2018   Chronic low back pain (1ry area of Pain) (Bilateral) (L>R) w/o sciatica 12/02/2018   Abnormal CT of  lumbar spine (10/11/2018) 12/02/2018   Lumbar facet  hypertrophy (Multilevel) 12/02/2018   DDD (degenerative disc disease), lumbar 12/02/2018   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), w/ neurogenic claudication 12/02/2018   Osteoarthritis of facet joint of lumbar spine 12/02/2018   Osteoarthritis of lumbar spine 12/02/2018   Lumbar spondylosis 12/02/2018   Chronic hip pain (Left) 11/29/2018   Osteoarthritis of hip (Left) 11/29/2018   Lumbar facet syndrome (Bilateral) (L>R) 11/29/2018   Vitamin B12 deficiency 11/29/2018   Elevated C-reactive protein (CRP) 11/15/2018   Elevated sed rate 11/15/2018   Chronic low back pain (1ry area of Pain) (Bilateral) (L>R) w/ sciatica (Left) 11/09/2018   Chronic lower extremity pain (2ry area of Pain) (Left) 11/09/2018   Chronic shoulder pain (3ry area of Pain) (Bilateral) (R>L) 11/09/2018   Pharmacologic therapy 11/09/2018   Disorder of skeletal system 11/09/2018   Problems influencing health status 11/09/2018   Acute diverticulitis 05/23/2018   Hyperglycemia 07/28/2017   Vitamin D  insufficiency 03/25/2017   Chronic pain syndrome 03/16/2017   Encounter for long-term opiate analgesic use 03/16/2017   Long term current use of opiate analgesic 03/16/2017   Opiate use 03/16/2017   Degenerative joint disease (DJD) of hip 03/16/2017   Sacroiliac joint pain 03/16/2017   Sepsis (HCC) 09/30/2016   Osteoarthritis of shoulder (Right) 10/17/2015   Personal history of tobacco use, presenting hazards to health 06/18/2015   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   Rotator cuff tendinitis 03/10/2014   History of rotator cuff surgery (Bilateral) 03/10/2014    Past Surgical History:  Procedure Laterality Date   ABDOMINAL HYSTERECTOMY N/A 1982   APPENDECTOMY     BACK SURGERY  2002   CARPAL TUNNEL RELEASE     CATARACT EXTRACTION  2006   CHOLECYSTECTOMY     COLONOSCOPY WITH ESOPHAGOGASTRODUODENOSCOPY (EGD)     COLONOSCOPY WITH PROPOFOL  N/A 08/03/2019    Procedure: COLONOSCOPY WITH PROPOFOL ;  Surgeon: Toledo, Ladell POUR, MD;  Location: [facility] ENDOSCOPY;  Service: Gastroenterology;  Laterality: N/A;   ESOPHAGOGASTRODUODENOSCOPY (EGD) WITH PROPOFOL  N/A 02/16/2017   Procedure: ESOPHAGOGASTRODUODENOSCOPY (EGD) WITH PROPOFOL ;  Surgeon: Viktoria Lamar DASEN, MD;  Location: [facility] ENDOSCOPY;  Service: Endoscopy;  Laterality: N/A;   ESOPHAGOGASTRODUODENOSCOPY (EGD) WITH PROPOFOL  N/A 08/03/2019   Procedure: ESOPHAGOGASTRODUODENOSCOPY (EGD) WITH PROPOFOL ;  Surgeon: Toledo, Ladell POUR, MD;  Location: [facility] ENDOSCOPY;  Service: Gastroenterology;  Laterality: N/A;   LUMBAR FUSION  2006   MASTECTOMY PARTIAL / LUMPECTOMY Right 12/06/2020   REPAIR ROTATOR CUFF TEAR     TOTAL HIP ARTHROPLASTY Left 06/04/2023   Procedure: TOTAL HIP ARTHROPLASTY ANTERIOR APPROACH;  Surgeon: Lorelle Hussar, MD;  Location: [facility] ORS;  Service: Orthopedics;  Laterality: Left;   TOTAL KNEE ARTHROPLASTY Bilateral 1999   UPPER ESOPHAGEAL ENDOSCOPIC ULTRASOUND (EUS) N/A 01/03/2016   Procedure: UPPER ESOPHAGEAL ENDOSCOPIC ULTRASOUND (EUS);  Surgeon: Asberry DELENA Coffee, MD;  Location: [facility] ENDOSCOPY;  Service: Gastroenterology;  Laterality: N/A;  OB History   No obstetric history on file.      Home Medications    Prior to Admission medications   Medication Sig Start Date End Date Taking? Authorizing Provider  acetaminophen  (TYLENOL ) 500 MG tablet Take 2 tablets (1,000 mg total) by mouth every 8 (eight) hours. 06/09/23  Yes Charlene Debby BROCKS, PA-C  Ascorbic Acid  (VITAMIN C ) 1000 MG tablet Take 1,000 mg by mouth every morning.   Yes [provider]  aspirin  81 MG tablet Take 81 mg by mouth daily.   Yes [provider]  atorvastatin  (LIPITOR) 40 MG tablet Take 40 mg by mouth daily.   Yes [provider]  calcium  carbonate (OSCAL) 1500 (600 Ca) MG TABS tablet Take 600 mg of elemental calcium  by mouth daily with breakfast.   Yes [provider]  COMBIVENT  RESPIMAT 20-100 MCG/ACT AERS respimat USE 1 VIAL VIA NEBULIZER 4 TIMES A DAY AS NEEDED 12/13/18  Yes [provider]  diclofenac  Sodium (VOLTAREN ) 1 % GEL Apply 2 g topically every 8 (eight) hours as needed. Right hand 05/12/24  Yes Trishelle Devora, Rilla, NP  docusate sodium  (COLACE) 100 MG capsule Take 1 capsule (100 mg total) by mouth 2 (two) times daily. 06/06/23  Yes Charlene Debby BROCKS, PA-C  DULoxetine  (CYMBALTA ) 30 MG capsule Take 30 mg by mouth every morning.   Yes [provider]  Fe Bisgly-Vit C-Vit B12-FA 28-60-0.008-0.4 MG CAPS Take 1 tablet by mouth daily. If stomach upset recommend taking with food. 08/11/23  Yes Dasie Tinnie MATSU, NP  insulin  lispro (HUMALOG) 100 UNIT/ML KwikPen Inject 3 Units into the skin 3 (three) times daily.  08/31/18  Yes [provider]  JARDIANCE 25 MG TABS tablet Take 25 mg by mouth daily.  03/25/18  Yes [provider]  latanoprost  (XALATAN ) 0.005 % ophthalmic solution Place 1 drop into both eyes at bedtime.    Yes [provider]  LEVEMIR  FLEXTOUCH 100 UNIT/ML Pen Inject 25 Units into the skin daily. Patient reports she only takes Levemir  if her glucose is >150 mg/dl which is rare for her 3/86/80  Yes [provider]  metFORMIN  (GLUCOPHAGE ) 500 MG tablet Take 500 mg by mouth 2 (two) times daily with a meal.   Yes [provider]  olmesartan -hydrochlorothiazide  (BENICAR  HCT) 40-25 MG tablet Take 1 tablet by mouth daily.   Yes [provider]  Omega-3 Fatty Acids (FISH OIL) 1000 MG CAPS Take by mouth daily.   Yes [provider]  ondansetron  (ZOFRAN ) 4 MG tablet Take 1 tablet (4 mg total) by mouth every 6 (six) hours as needed for nausea. 06/06/23  Yes Charlene Debby BROCKS, PA-C  pantoprazole  (PROTONIX ) 20 MG tablet Take 1 tablet (20 mg total) by mouth daily. 06/09/23  Yes Charlene Debby BROCKS, PA-C  pantoprazole  (PROTONIX ) 40 MG tablet Take 1 tablet by mouth 2 (two) times daily.   Yes [provider]  potassium chloride  SA (KLOR-CON  M) 20 MEQ tablet TAKE 1 TABLET BY MOUTH EVERY DAY 12/08/23  Yes Brahmanday, Govinda R, MD  rOPINIRole  (REQUIP ) 1 MG tablet TAKE 1 TABLET BY MOUTH EVERYDAY AT BEDTIME 11/11/18  Yes [provider]  traZODone  (DESYREL ) 50 MG tablet  07/18/20  Yes [provider]  vitamin B-12 1000 MCG tablet Take 1 tablet (1,000 mcg total) by mouth daily. 07/25/19  Yes Vachhani, Vaibhavkumar, MD  diphenhydrAMINE  (BENADRYL ) 25 mg capsule Take 1 capsule (25 mg total) by mouth every 6 (six) hours as needed for itching.  06/09/23   Charlene Debby BROCKS, PA-C  enoxaparin  (LOVENOX ) 40 MG/0.4ML injection Inject 0.4 mLs (40 mg total) into the skin daily for 14 days. 06/09/23 06/23/23  Charlene Debby BROCKS, PA-C  feeding supplement (ENSURE ENLIVE / ENSURE PLUS) LIQD Take 237 mLs by mouth 2 (two) times daily between meals. 06/09/23   Charlene Debby BROCKS, PA-C  gabapentin  (NEURONTIN ) 300 MG capsule Take 300 mg by mouth 3 (three) times daily.    [provider]    Family History Family History  Problem Relation Age of Onset   Diabetes Mother    Diabetes Sister     Social History Social History   Tobacco Use   Smoking status: Every Day    Current packs/day: 0.40    Types: Cigarettes   Smokeless tobacco: Never  Vaping Use   Vaping status: Never Used  Substance Use Topics   Alcohol use: No   Drug use: No     Allergies   Penicillins   Review of Systems Review of Systems  Constitutional:  Negative for fever.  Musculoskeletal:  Positive for arthralgias and joint swelling.  Skin: Negative.   All other systems reviewed and are negative.    Physical Exam Triage Vital Signs ED Triage Vitals [05/12/24 1156]  Encounter Vitals Group     BP (!) 160/78     Girls Systolic BP Percentile      Girls Diastolic BP Percentile      Boys Systolic BP Percentile      Boys Diastolic BP Percentile      Pulse Rate 75     Resp 18     Temp 98 F (36.7 C)     Temp Source Oral      SpO2 98 %     Weight      Height      Head Circumference      Peak Flow      Pain Score 6     Pain Loc      Pain Education      Exclude from Growth Chart    No data found.  Updated Vital Signs BP (!) 160/78 (BP Location: Right Arm)   Pulse 75   Temp 98 F (36.7 C) (Oral)   Resp 18   SpO2 98%   Visual Acuity Right Eye Distance:   Left Eye Distance:   Bilateral Distance:    Right Eye Near:   Left Eye Near:    Bilateral Near:     Physical Exam Vitals and nursing note reviewed.  Constitutional:      General: She is not in acute distress.    Appearance: She is well-developed.  HENT:     Head: Normocephalic and atraumatic.  Eyes:     Conjunctiva/sclera: Conjunctivae normal.  Cardiovascular:     Rate and Rhythm: Normal rate and regular rhythm.     Pulses:          Radial pulses are 2+ on the right side.     Heart sounds: No murmur heard. Pulmonary:     Effort: Pulmonary effort is normal. No respiratory distress.  Abdominal:     Palpations: Abdomen is soft.     Tenderness: There is no abdominal tenderness.  Musculoskeletal:        General: No swelling.     Right hand: Swelling and tenderness present. No deformity. Normal pulse.     Cervical back: Neck supple.     Comments: Right dorsal hand + swelling, + tenderness, no  break in skin,no rash, no purulent drainage  Skin:    General: Skin is warm and dry.     Capillary Refill: Capillary refill takes less than 2 seconds.  Neurological:     General: No focal deficit present.     Mental Status: She is alert and oriented to person, place, and time.     GCS: GCS eye subscore is 4. GCS verbal subscore is 5. GCS motor subscore is 6.  Psychiatric:        Attention and Perception: Attention normal.        Mood and Affect: Mood normal.        Speech: Speech normal.        Behavior: Behavior normal. Behavior is cooperative.      UC Treatments / Results  Labs (all labs ordered are listed, but only abnormal results are  displayed) Labs Reviewed - No data to display  EKG   Radiology No results found.  Procedures Procedures (including critical care time)  Medications Ordered in UC Medications - No data to display  Initial Impression / Assessment and Plan / UC Course  I have reviewed the triage vital signs and the nursing notes.  Pertinent labs & imaging results that were available during my care of the patient were reviewed by me and considered in my medical decision making (see chart for details).  Clinical Course as of 05/12/24 1327  Thu May 12, 2024  1225 Discussed with patient options for management of gout, patient is not interesting in prednisone or colcrys/allopurinol due to possible side effects of kidney affects or elevated blood sugar, will try OTC management, ice,elevation,increase water, avoid high purine foods. Will try biofreeze,voltaren  gel, lidocaine  gel. No imaging due to no trauma or fall [JD]    Clinical Course User Index [JD] Mcclellan Demarais, Rilla, NP    Ddx: Gout, arthritis, cellulitis, trauma, fracture Final Clinical Impressions(s) / UC Diagnoses   Final diagnoses:  Gouty arthritis of hand  Elevated blood pressure reading in office with diagnosis of hypertension     Discharge Instructions      May use voltaren  gel as directed May use over the counter biofreeze, lidocaine  gel as label directed for pain Rest,ice,elevate, drink plenty of water Follow up with PCP-call for appt Take home meds as prescribed       ED Prescriptions     Medication Sig Dispense Auth. Provider   diclofenac  Sodium (VOLTAREN ) 1 % GEL Apply 2 g topically every 8 (eight) hours as needed. Right hand 20 g Kc Summerson, Rilla, NP      I have reviewed the PDMP during this encounter.   Aminta Rilla, NP 05/12/24 1327

## 2024-05-12 NOTE — ED Triage Notes (Signed)
 Patient presents to the office for right hand pain and swelling. Patient states she woke up with pain and swelling in her hand.  Denies any injuries. Home Intervention: None

## 2024-05-12 NOTE — Discharge Instructions (Addendum)
 May use voltaren  gel as directed May use over the counter biofreeze, lidocaine  gel as label directed for pain Rest,ice,elevate, drink plenty of water Follow up with PCP-call for appt Take home meds as prescribed

## 2024-06-12 NOTE — Progress Notes (Deleted)
 Referring Physician:  Donal Channing SQUIBB, FNP [address] [place],  [place] [address]  Primary Physician:  Donal Channing SQUIBB, FNP  History of Present Illness: 06/12/2024 [name] has a history of breast CA, HTN, afib, CAD, malignant carcinoid tumor of duodenum, DM, chronic pain, hyperlipidemia, OSA, and failed back surgical syndrome.   History of lumbar surgery in 2002 and lumbar fusion in 2006. She did see improvement after this for a few years.   Has seen pain management in the past [place]). She's had short term relief with previous injections (up to a month).   Last did phone visit with me on 04/22/24 to review her lumbar MRI.   She is fused L4-S1. She has mild central stenosis T11-T12 with moderate/severe left foraminal stenosis, retrolisthesis L1-L2 with mild central stenosis and moderate bilateral foraminal stenosis, moderate central stenosis L2-L3, slip L3-L4 with severe central stenosis along with lateral recess stenosis and severe left/moderate right foraminal stenosis.    She declined injections and further surgery. She was sent to PT- had initial eval on 05/12/24. She was also sent to [facility] for medical management.   She is here for follow up.      She has constant LBP with bilateral leg pain that is more medial and lateral to her feet. LBP = leg pain, left leg pain = right leg pain. She has numbness, tingling, and weakness in both legs. Pain is worse with walking. Some relief shopping cart. Some improvement with stopping to sit.    She is taking tylenol , cymbalta , neurontin .   She smokes 6-7 cigarettes per day x 60 years. She has cut down.   Last HgbA1c on 10/15/22 was 6.5.   Bowel/Bladder Dysfunction: some bladder and bowel urgency x 1 year. No incontinence. No perineal numbness.   Conservative measures:  Physical therapy: [facility] initial eval on 05/12/24 *** Multimodal medical therapy including regular antiinflammatories: tylenol ,  cymbalta , neurontin   Injections:  Left L3 TF and right L2-L3 ESI 09/25/20 Left L3 TF and right L2-L3 ESI 09/05/20 Right L2-L3 IL ESI 08/21/20 Left L3 TF and right L2-L3 ESI on 07/26/20  Past Surgery:  Lumbar fusion 2006 Lumbar surgery 2002  Lynn Potter has \\no  symptoms of cervical myelopathy.  The symptoms are causing a significant impact on the patient's life.   Review of Systems:  A 10 point review of systems is negative, except for the pertinent positives and negatives detailed in the HPI.  Past Medical History: Past Medical History:  Diagnosis Date   AF (atrial fibrillation) (HCC)    a.) CHA2DS2-VASc: 69 (age x2, sex, HTN, vascular disease history, T2DM); b.) rate/rhythm maintained intrinsically without pharmacological intervention; no chronic oral anticoagulation   Aortic atherosclerosis (HCC)    Arthritis    Asthma    B12 deficiency    Bilateral carpal tunnel syndrome    CAD (coronary artery disease)    Cerebral microvascular disease    Chicken pox    Clotting disorder (HCC)    COPD (chronic obstructive pulmonary disease) (HCC)    DDD (degenerative disc disease), lumbosacral    a.) s/p L4-S1 fusion in 2006   Depression    Diabetic peripheral neuropathy (HCC)    Diastolic dysfunction 06/06/2022   a.) TTE 06/06/2022: EF >55%, no RWMAs, mild LVH, triv MR/TR, mild AR, RVSP 20.5, G1DD   Diverticulosis    DOE (dyspnea on exertion)    GERD (gastroesophageal reflux disease)    Glaucoma    Hepatic steatosis  HH (hiatus hernia)    History of kidney stones    HLD (hyperlipidemia)    Hypertension    IDA (iron  deficiency anemia)    Insomnia    a.) uses trazodone  PRN   Internal hemorrhoids 12/25/2014   LAFB (left anterior fascicular block)    Long term current use of aspirin     Lumbar spinal stenosis    Migraines    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   a.) Bx 10/18/2020 - spindle cell neoplasm; b.) s/p partial mastectomy 12/06/2020 --> final pathology  revealed estrogen receptor (+) myofibroblastoma   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; well differentiated --> s/p bowel resection   Obesity    OSA (obstructive sleep apnea)    a.) non-compliant with prescribed nocturnal PAP therapy   Personal history of tobacco use, presenting hazards to health 06/18/2015   RLS (restless legs syndrome)    a.) on ropinirole    Status post carpal tunnel release    T2DM (type 2 diabetes mellitus) (HCC)    Tubular adenoma of colon     Past Surgical History: Past Surgical History:  Procedure Laterality Date   ABDOMINAL HYSTERECTOMY N/A 1982   APPENDECTOMY     BACK SURGERY  2002   CARPAL TUNNEL RELEASE     CATARACT EXTRACTION  2006   CHOLECYSTECTOMY     COLONOSCOPY WITH ESOPHAGOGASTRODUODENOSCOPY (EGD)     COLONOSCOPY WITH PROPOFOL  N/A 08/03/2019   Procedure: COLONOSCOPY WITH PROPOFOL ;  Surgeon: Toledo, Ladell POUR, MD;  Location: [facility] ENDOSCOPY;  Service: Gastroenterology;  Laterality: N/A;   ESOPHAGOGASTRODUODENOSCOPY (EGD) WITH PROPOFOL  N/A 02/16/2017   Procedure: ESOPHAGOGASTRODUODENOSCOPY (EGD) WITH PROPOFOL ;  Surgeon: Viktoria Lamar DASEN, MD;  Location: [facility] ENDOSCOPY;  Service: Endoscopy;  Laterality: N/A;   ESOPHAGOGASTRODUODENOSCOPY (EGD) WITH PROPOFOL  N/A 08/03/2019   Procedure: ESOPHAGOGASTRODUODENOSCOPY (EGD) WITH PROPOFOL ;  Surgeon: Toledo, Ladell POUR, MD;  Location: [facility] ENDOSCOPY;  Service: Gastroenterology;  Laterality: N/A;   LUMBAR FUSION  2006   MASTECTOMY PARTIAL / LUMPECTOMY Right 12/06/2020   REPAIR ROTATOR CUFF TEAR     TOTAL HIP ARTHROPLASTY Left 06/04/2023   Procedure: TOTAL HIP ARTHROPLASTY ANTERIOR APPROACH;  Surgeon: Lorelle Hussar, MD;  Location: [facility] ORS;  Service: Orthopedics;  Laterality: Left;   TOTAL KNEE ARTHROPLASTY Bilateral 1999   UPPER ESOPHAGEAL ENDOSCOPIC ULTRASOUND (EUS) N/A 01/03/2016   Procedure: UPPER ESOPHAGEAL ENDOSCOPIC ULTRASOUND (EUS);  Surgeon: Asberry DELENA Coffee, MD;   Location: [facility] ENDOSCOPY;  Service: Gastroenterology;  Laterality: N/A;    Allergies: Allergies as of 06/16/2024 - Review Complete 05/12/2024  Allergen Reaction Noted   Penicillins Hives 10/29/2023    Medications: Outpatient Encounter Medications as of 06/16/2024  Medication Sig   acetaminophen  (TYLENOL ) 500 MG tablet Take 2 tablets (1,000 mg total) by mouth every 8 (eight) hours.   Ascorbic Acid  (VITAMIN C ) 1000 MG tablet Take 1,000 mg by mouth every morning.   aspirin  81 MG tablet Take 81 mg by mouth daily.   atorvastatin  (LIPITOR) 40 MG tablet Take 40 mg by mouth daily.   calcium  carbonate (OSCAL) 1500 (600 Ca) MG TABS tablet Take 600 mg of elemental calcium  by mouth daily with breakfast.   COMBIVENT RESPIMAT 20-100 MCG/ACT AERS respimat USE 1 VIAL VIA NEBULIZER 4 TIMES A DAY AS NEEDED   diclofenac  Sodium (VOLTAREN ) 1 % GEL Apply 2 g topically every 8 (eight) hours as needed. Right hand   diphenhydrAMINE  (BENADRYL ) 25 mg capsule Take 1 capsule (25 mg total) by mouth every 6 (six)  hours as needed for itching.   docusate sodium  (COLACE) 100 MG capsule Take 1 capsule (100 mg total) by mouth 2 (two) times daily.   DULoxetine  (CYMBALTA ) 30 MG capsule Take 30 mg by mouth every morning.   enoxaparin  (LOVENOX ) 40 MG/0.4ML injection Inject 0.4 mLs (40 mg total) into the skin daily for 14 days.   Fe Bisgly-Vit C-Vit B12-FA 28-60-0.008-0.4 MG CAPS Take 1 tablet by mouth daily. If stomach upset recommend taking with food.   feeding supplement (ENSURE ENLIVE / ENSURE PLUS) LIQD Take 237 mLs by mouth 2 (two) times daily between meals.   gabapentin  (NEURONTIN ) 300 MG capsule Take 300 mg by mouth 3 (three) times daily.   insulin  lispro (HUMALOG) 100 UNIT/ML KwikPen Inject 3 Units into the skin 3 (three) times daily.    JARDIANCE 25 MG TABS tablet Take 25 mg by mouth daily.    latanoprost  (XALATAN ) 0.005 % ophthalmic solution Place 1 drop into both eyes at bedtime.    LEVEMIR  FLEXTOUCH 100 UNIT/ML Pen  Inject 25 Units into the skin daily. Patient reports she only takes Levemir  if her glucose is >150 mg/dl which is rare for her   metFORMIN  (GLUCOPHAGE ) 500 MG tablet Take 500 mg by mouth 2 (two) times daily with a meal.   olmesartan -hydrochlorothiazide  (BENICAR  HCT) 40-25 MG tablet Take 1 tablet by mouth daily.   Omega-3 Fatty Acids (FISH OIL) 1000 MG CAPS Take by mouth daily.   ondansetron  (ZOFRAN ) 4 MG tablet Take 1 tablet (4 mg total) by mouth every 6 (six) hours as needed for nausea.   pantoprazole  (PROTONIX ) 20 MG tablet Take 1 tablet (20 mg total) by mouth daily.   pantoprazole  (PROTONIX ) 40 MG tablet Take 1 tablet by mouth 2 (two) times daily.   potassium chloride  SA (KLOR-CON  M) 20 MEQ tablet TAKE 1 TABLET BY MOUTH EVERY DAY   rOPINIRole  (REQUIP ) 1 MG tablet TAKE 1 TABLET BY MOUTH EVERYDAY AT BEDTIME   traZODone  (DESYREL ) 50 MG tablet    vitamin B-12 1000 MCG tablet Take 1 tablet (1,000 mcg total) by mouth daily.   No facility-administered encounter medications on file as of 06/16/2024.    Social History: Social History   Tobacco Use   Smoking status: Every Day    Current packs/day: 0.40    Types: Cigarettes   Smokeless tobacco: Never  Vaping Use   Vaping status: Never Used  Substance Use Topics   Alcohol use: No   Drug use: No    Family Medical History: Family History  Problem Relation Age of Onset   Diabetes Mother    Diabetes Sister     Physical Examination: There were no vitals filed for this visit.    Awake, alert, oriented to person, place, and time.  Speech is clear and fluent. Fund of knowledge is appropriate.   Cranial Nerves: Pupils equal round and reactive to light.  Facial tone is symmetric.    Diffuse lower posterior lumbar tenderness.   No abnormal lesions on exposed skin.   Strength: Side Biceps Triceps Deltoid Interossei Grip Wrist Ext. Wrist Flex.  R 5 5 5 5 5 5 5   L 5 5 5 5 5 5 5    Side Iliopsoas Quads Hamstring PF DF EHL  R 5 5 5 5 5 5    L 5 5 5 5 5 5    Reflexes are 1+ and symmetric at the biceps, brachioradialis, patella and achilles.   Hoffman's is absent.  Clonus is not present.   Bilateral  upper and lower extremity sensation is intact to light touch.     She has some shaking in her upper extremities with strength testing. States she's had this for years.   She has mild groin pain with IR/ER or left hip. No pain with IR/ER of left hip.   Gait not tested. She uses a rollator.   Medical Decision Making  Imaging: none  Assessment and Plan: Ms. Butrick has history of lumbar surgery x 2 with fusion in 2006. She did well for a few years then pain started.    She has constant LBP with bilateral leg pain that is more medial and lateral to her feet. LBP = leg pain, left leg pain = right leg pain. She has numbness, tingling, and weakness in both legs. Pain is worse with walking. Some relief shopping cart. Some improvement with stopping to sit.    She is fused L4-S1. She has mild central stenosis T11-T12 with moderate/severe left foraminal stenosis, retrolisthesis L1-L2 with mild central stenosis and moderate bilateral foraminal stenosis, moderate central stenosis L2-L3, slip L3-L4 with severe central stenosis along with lateral recess stenosis and severe left/moderate right foraminal stenosis.    Most of her symptoms are likely from L3-L4.    Treatment options discussed with patient and following plan made:   - She does not want to repeat injections or consider any surgery options for her lumbar spine.  - PT orders to [facility] in [place].  - Referral to [facility] to discuss medical management of chronic pain.  - Follow up with me in 6-8 weeks for recheck.    I spent a total of *** minutes in face-to-face and non-face-to-face activities related to this patient's care today including review of outside records, review of imaging, review of symptoms, physical exam, discussion of differential diagnosis,  discussion of treatment options, and documentation.   Glade Boys PA-C Dept. of Neurosurgery

## 2024-06-16 ENCOUNTER — Ambulatory Visit: Admitting: Orthopedic Surgery

## 2024-06-29 NOTE — Progress Notes (Addendum)
 Referring Physician:  Donal Channing SQUIBB, FNP [address] [place],  [place] [address]  Primary Physician:  Donal Channing SQUIBB, FNP  History of Present Illness: [name] has a history of breast CA, HTN, afib, CAD, malignant carcinoid tumor of duodenum, DM, chronic pain, hyperlipidemia, OSA, and failed back surgical syndrome.   History of lumbar surgery in 2002 and lumbar fusion in 2006. She did see improvement after this for a few years.   Has seen pain management in the past [place]). She's had short term relief with previous injections (up to a month).   Last did phone visit with me on 04/22/24 to review her lumbar MRI.   She is fused L4-S1. She has mild central stenosis T11-T12 with moderate/severe left foraminal stenosis, retrolisthesis L1-L2 with mild central stenosis and moderate bilateral foraminal stenosis, moderate central stenosis L2-L3, slip L3-L4 with severe central stenosis along with lateral recess stenosis and severe left/moderate right foraminal stenosis.    She declined injections and further surgery. She was sent to PT- had initial eval on 05/12/24 and was discharged 07/01/24. She was also sent to [facility] for medical management.   She is here for follow up.   She has seen about 50% improvement in her pain with PT. She was discharged on Friday. She was never contacted by [facility].   She still has constant LBP with bilateral leg pain that is more medial and lateral to her feet. LBP = leg pain, left leg pain = right leg pain. She has numbness, tingling, and weakness in both legs. Pain is worse with walking. She feels like she is about to walk farther since doing PT.   She is taking tylenol , cymbalta , neurontin .   She smokes 6-7 cigarettes per day x 60 years. She has cut down.   Conservative measures:  Physical therapy: [facility] initial eval on 05/12/24 and was discharged 07/01/24 Multimodal medical therapy including  regular antiinflammatories: tylenol , cymbalta , neurontin   Injections:  Left L3 TF and right L2-L3 ESI 09/25/20 Left L3 TF and right L2-L3 ESI 09/05/20 Right L2-L3 IL ESI 08/21/20 Left L3 TF and right L2-L3 ESI on 07/26/20  Past Surgery:  Lumbar fusion 2006 Lumbar surgery 2002  Lynn Potter has \\no  symptoms of cervical myelopathy.  The symptoms are causing a significant impact on the patient's life.   Review of Systems:  A 10 point review of systems is negative, except for the pertinent positives and negatives detailed in the HPI.  Past Medical History: Past Medical History:  Diagnosis Date   AF (atrial fibrillation) (HCC)    a.) CHA2DS2-VASc: 35 (age x2, sex, HTN, vascular disease history, T2DM); b.) rate/rhythm maintained intrinsically without pharmacological intervention; no chronic oral anticoagulation   Aortic atherosclerosis    Arthritis    Asthma    B12 deficiency    Bilateral carpal tunnel syndrome    CAD (coronary artery disease)    Cerebral microvascular disease    Chicken pox    Clotting disorder    COPD (chronic obstructive pulmonary disease) (HCC)    DDD (degenerative disc disease), lumbosacral    a.) s/p L4-S1 fusion in 2006   Depression    Diabetic peripheral neuropathy (HCC)    Diastolic dysfunction 06/06/2022   a.) TTE 06/06/2022: EF >55%, no RWMAs, mild LVH, triv MR/TR, mild AR, RVSP 20.5, G1DD   Diverticulosis    DOE (dyspnea on exertion)    GERD (gastroesophageal reflux disease)    Glaucoma  Hepatic steatosis    HH (hiatus hernia)    History of kidney stones    HLD (hyperlipidemia)    Hypertension    IDA (iron  deficiency anemia)    Insomnia    a.) uses trazodone  PRN   Internal hemorrhoids 12/25/2014   LAFB (left anterior fascicular block)    Long term current use of aspirin     Lumbar spinal stenosis    Migraines    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   a.) Bx 10/18/2020 - spindle cell neoplasm; b.) s/p partial mastectomy 12/06/2020  --> final pathology revealed estrogen receptor (+) myofibroblastoma   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; well differentiated --> s/p bowel resection   Obesity    OSA (obstructive sleep apnea)    a.) non-compliant with prescribed nocturnal PAP therapy   Personal history of tobacco use, presenting hazards to health 06/18/2015   RLS (restless legs syndrome)    a.) on ropinirole    Status post carpal tunnel release    T2DM (type 2 diabetes mellitus) (HCC)    Tubular adenoma of colon     Past Surgical History: Past Surgical History:  Procedure Laterality Date   ABDOMINAL HYSTERECTOMY N/A 1982   APPENDECTOMY     BACK SURGERY  2002   CARPAL TUNNEL RELEASE     CATARACT EXTRACTION  2006   CHOLECYSTECTOMY     COLONOSCOPY WITH ESOPHAGOGASTRODUODENOSCOPY (EGD)     COLONOSCOPY WITH PROPOFOL  N/A 08/03/2019   Procedure: COLONOSCOPY WITH PROPOFOL ;  Surgeon: Toledo, Ladell POUR, MD;  Location: [facility] ENDOSCOPY;  Service: Gastroenterology;  Laterality: N/A;   ESOPHAGOGASTRODUODENOSCOPY (EGD) WITH PROPOFOL  N/A 02/16/2017   Procedure: ESOPHAGOGASTRODUODENOSCOPY (EGD) WITH PROPOFOL ;  Surgeon: Viktoria Lamar DASEN, MD;  Location[facility] ENDOSCOPY;  Service: Endoscopy;  Laterality: N/A;   ESOPHAGOGASTRODUODENOSCOPY (EGD) WITH PROPOFOL  N/A 08/03/2019   Procedure: ESOPHAGOGASTRODUODENOSCOPY (EGD) WITH PROPOFOL ;  Surgeon: Toledo, Ladell POUR, MD;  Location: [facility] ENDOSCOPY;  Service: Gastroenterology;  Laterality: N/A;   LUMBAR FUSION  2006   MASTECTOMY PARTIAL / LUMPECTOMY Right 12/06/2020   REPAIR ROTATOR CUFF TEAR     TOTAL HIP ARTHROPLASTY Left 06/04/2023   Procedure: TOTAL HIP ARTHROPLASTY ANTERIOR APPROACH;  Surgeon: Lorelle Hussar, MD;  Location: [facility] ORS;  Service: Orthopedics;  Laterality: Left;   TOTAL KNEE ARTHROPLASTY Bilateral 1999   UPPER ESOPHAGEAL ENDOSCOPIC ULTRASOUND (EUS) N/A 01/03/2016   Procedure: UPPER ESOPHAGEAL ENDOSCOPIC ULTRASOUND (EUS);  Surgeon: Asberry DELENA Coffee, MD;  Location: [facility] ENDOSCOPY;  Service: Gastroenterology;  Laterality: N/A;    Allergies: Allergies as of 07/04/2024 - Review Complete 07/04/2024  Allergen Reaction Noted   Penicillins Hives 10/29/2023    Medications: Outpatient Encounter Medications as of 07/04/2024  Medication Sig   ACCU-CHEK AVIVA PLUS test strip 2 (two) times daily.   ACCU-CHEK AVIVA PLUS test strip 2 (two) times daily.   acetaminophen  (TYLENOL ) 500 MG tablet Take 2 tablets (1,000 mg total) by mouth every 8 (eight) hours.   Ascorbic Acid  (VITAMIN C ) 1000 MG tablet Take 1,000 mg by mouth every morning.   aspirin  81 MG tablet Take 81 mg by mouth daily.   atorvastatin  (LIPITOR) 40 MG tablet Take 40 mg by mouth daily.   calcium  carbonate (OSCAL) 1500 (600 Ca) MG TABS tablet Take 600 mg of elemental calcium  by mouth daily with breakfast.   COMBIVENT RESPIMAT 20-100 MCG/ACT AERS respimat USE 1 VIAL VIA NEBULIZER 4 TIMES A DAY AS NEEDED   diclofenac  Sodium (VOLTAREN ) 1 % GEL Apply 2 g  topically every 8 (eight) hours as needed. Right hand   diphenhydrAMINE  (BENADRYL ) 25 mg capsule Take 1 capsule (25 mg total) by mouth every 6 (six) hours as needed for itching.   Fe Bisgly-Vit C-Vit B12-FA 28-60-0.008-0.4 MG CAPS Take 1 tablet by mouth daily. If stomach upset recommend taking with food.   feeding supplement (ENSURE ENLIVE / ENSURE PLUS) LIQD Take 237 mLs by mouth 2 (two) times daily between meals.   gabapentin  (NEURONTIN ) 300 MG capsule Take 600 mg by mouth 3 (three) times daily.   insulin  lispro (HUMALOG) 100 UNIT/ML KwikPen Inject 3 Units into the skin 3 (three) times daily.    JARDIANCE 25 MG TABS tablet Take 25 mg by mouth daily.    latanoprost  (XALATAN ) 0.005 % ophthalmic solution Place 1 drop into both eyes at bedtime.    LEVEMIR  FLEXTOUCH 100 UNIT/ML Pen Inject 25 Units into the skin daily. Patient reports she only takes Levemir  if her glucose is >150 mg/dl which is rare for her   metFORMIN  (GLUCOPHAGE ) 500  MG tablet Take 500 mg by mouth 2 (two) times daily with a meal.   olmesartan -hydrochlorothiazide  (BENICAR  HCT) 40-25 MG tablet Take 1 tablet by mouth daily.   Omega-3 Fatty Acids (FISH OIL) 1000 MG CAPS Take by mouth daily.   pantoprazole  (PROTONIX ) 40 MG tablet Take 1 tablet by mouth daily.   potassium chloride  SA (KLOR-CON  M) 20 MEQ tablet TAKE 1 TABLET BY MOUTH EVERY DAY   rOPINIRole  (REQUIP ) 1 MG tablet TAKE 1 TABLET BY MOUTH EVERYDAY AT BEDTIME   traZODone  (DESYREL ) 50 MG tablet    vitamin B-12 1000 MCG tablet Take 1 tablet (1,000 mcg total) by mouth daily.   [DISCONTINUED] ondansetron  (ZOFRAN ) 4 MG tablet Take 1 tablet (4 mg total) by mouth every 6 (six) hours as needed for nausea.   [DISCONTINUED] docusate sodium  (COLACE) 100 MG capsule Take 1 capsule (100 mg total) by mouth 2 (two) times daily.   [DISCONTINUED] DULoxetine  (CYMBALTA ) 30 MG capsule Take 30 mg by mouth every morning.   [DISCONTINUED] enoxaparin  (LOVENOX ) 40 MG/0.4ML injection Inject 0.4 mLs (40 mg total) into the skin daily for 14 days.   [DISCONTINUED] gabapentin  (NEURONTIN ) 300 MG capsule Take 300 mg by mouth 3 (three) times daily.   [DISCONTINUED] pantoprazole  (PROTONIX ) 20 MG tablet Take 1 tablet (20 mg total) by mouth daily.   No facility-administered encounter medications on file as of 07/04/2024.    Social History: Social History   Tobacco Use   Smoking status: Every Day    Current packs/day: 0.40    Types: Cigarettes   Smokeless tobacco: Never  Vaping Use   Vaping status: Never Used  Substance Use Topics   Alcohol use: No   Drug use: No    Family Medical History: Family History  Problem Relation Age of Onset   Diabetes Mother    Diabetes Sister     Physical Examination: Vitals:   07/04/24 0935 07/04/24 1000  BP: (!) 164/82 (!) 146/86      Awake, alert, oriented to person, place, and time.  Speech is clear and fluent. Fund of knowledge is appropriate.   Cranial Nerves: Pupils equal round and  reactive to light.  Facial tone is symmetric.    No lower posterior lumbar tenderness.   No abnormal lesions on exposed skin.   Strength: Side Iliopsoas Quads Hamstring PF DF EHL  R 5 5 5 5 5 5   L 5 5 5 5 5 5    Reflexes are  1+ and symmetric at the patella and achilles.    Clonus is not present.   Bilateral lower extremity sensation is intact to light touch.     No pain with IR/ER of both hips.   She uses a rollator.   Medical Decision Making  Imaging: none  Assessment and Plan: Ms. Devora has history of lumbar surgery x 2 with fusion in 2006. She did well for a few years then pain started.   She has seen about 50% improvement in her pain with PT. She still has constant LBP with bilateral leg pain that is more medial and lateral to her feet. LBP = leg pain, left leg pain = right leg pain.    She is fused L4-S1. She has mild central stenosis T11-T12 with moderate/severe left foraminal stenosis, retrolisthesis L1-L2 with mild central stenosis and moderate bilateral foraminal stenosis, moderate central stenosis L2-L3, slip L3-L4 with severe central stenosis along with lateral recess stenosis and severe left/moderate right foraminal stenosis.    Treatment options discussed with patient and following plan made:  - Continue with HEP from PT. Can revisit if needed [facility] in [place]).  - She does not want to repeat injections or consider any surgery options for her lumbar spine.  - Referral to [facility] to discuss medical management of chronic pain done but they are not taking new patients. Unable to travel to [place].  - Follow up with me 3-4 months for recheck.   BP was elevated. No symptoms of chest pain, shortness of breath, blurry vision, or headaches. She will recheck at home and call PCP if not improved. If she develops CP, SOB, blurry vision, or headaches, then she will go to ED.     I spent a total of 20 minutes in face-to-face and non-face-to-face  activities related to this patient's care today including review of outside records, review of imaging, review of symptoms, physical exam, discussion of differential diagnosis, discussion of treatment options, and documentation.   Glade Boys PA-C Dept. of Neurosurgery

## 2024-07-04 ENCOUNTER — Encounter: Payer: Self-pay | Admitting: Orthopedic Surgery

## 2024-07-04 ENCOUNTER — Ambulatory Visit: Admitting: Orthopedic Surgery

## 2024-07-04 VITALS — BP 146/86 | Ht 65.0 in | Wt 182.0 lb

## 2024-07-04 DIAGNOSIS — M48061 Spinal stenosis, lumbar region without neurogenic claudication: Secondary | ICD-10-CM

## 2024-07-04 DIAGNOSIS — Z981 Arthrodesis status: Secondary | ICD-10-CM | POA: Diagnosis not present

## 2024-07-04 DIAGNOSIS — M4316 Spondylolisthesis, lumbar region: Secondary | ICD-10-CM | POA: Diagnosis not present

## 2024-07-04 DIAGNOSIS — M47816 Spondylosis without myelopathy or radiculopathy, lumbar region: Secondary | ICD-10-CM

## 2024-07-04 DIAGNOSIS — M5416 Radiculopathy, lumbar region: Secondary | ICD-10-CM

## 2024-07-04 NOTE — Patient Instructions (Addendum)
 It was so nice to see you today. Thank you so much for coming in.    You have some wear and tear above your fusion at L3-L4 with spinal stenosis.   Continue with your home exercises from PT. Let me know if you want to go backto see them.   Continue to work on quitting smoking.   Your blood pressure was elevated today. I want you to recheck it at home and follow up with your PCP if it remains high. If you have any chest pain, shortness of breath, blurry vision, or headaches then you need to go to ED.    I will see you back in January. Please do not hesitate to call if you have any questions or concerns. You can also message me in MyChart.   Glade Boys PA-C [phone]     The physicians and staff at [facility] are committed to providing excellent care. You may receive a survey asking for feedback about your experience at our office. We value you your feedback and appreciate you taking the time to to fill it out. The [facility] leadership team is also available to discuss your experience in person, feel free to contact us  [phone].

## 2024-07-22 ENCOUNTER — Encounter: Payer: Self-pay | Admitting: Internal Medicine

## 2024-07-29 ENCOUNTER — Ambulatory Visit (INDEPENDENT_AMBULATORY_CARE_PROVIDER_SITE_OTHER): Admitting: Podiatry

## 2024-07-29 DIAGNOSIS — Z91199 Patient's noncompliance with other medical treatment and regimen due to unspecified reason: Secondary | ICD-10-CM

## 2024-07-29 NOTE — Progress Notes (Signed)
 1. No-show for appointment

## 2024-08-10 ENCOUNTER — Other Ambulatory Visit: Payer: 59

## 2024-08-10 ENCOUNTER — Ambulatory Visit: Payer: 59

## 2024-08-10 ENCOUNTER — Ambulatory Visit: Payer: 59 | Admitting: Internal Medicine

## 2024-09-19 ENCOUNTER — Encounter: Payer: Self-pay | Admitting: Podiatry

## 2024-09-19 ENCOUNTER — Ambulatory Visit: Admitting: Podiatry

## 2024-09-19 DIAGNOSIS — M79675 Pain in left toe(s): Secondary | ICD-10-CM

## 2024-09-19 DIAGNOSIS — E1142 Type 2 diabetes mellitus with diabetic polyneuropathy: Secondary | ICD-10-CM | POA: Diagnosis not present

## 2024-09-19 DIAGNOSIS — B351 Tinea unguium: Secondary | ICD-10-CM | POA: Diagnosis not present

## 2024-09-19 DIAGNOSIS — M79674 Pain in right toe(s): Secondary | ICD-10-CM | POA: Diagnosis not present

## 2024-09-19 NOTE — Progress Notes (Signed)
°  Subjective:  Patient ID: Lynn Potter, female    DOB: 11/14/1945,  MRN: [id number]  Lynn Potter presents to clinic today for at risk foot care with history of diabetic neuropathy and painful mycotic toenails of both feet that are difficult to trim. Pain interferes with daily activities and wearing enclosed shoe gear comfortably.  Chief Complaint  Patient presents with   Nail Problem    A1c 6.4   New problem(s): None.   PCP is Donal Channing SQUIBB, FNP.  Allergies[1]  Review of Systems: Negative except as noted in the HPI.  Objective:  There were no vitals filed for this visit. Lynn Potter is a pleasant 78 y.o. female in NAD. AAO x 3.  Vascular Examination: Vascular status intact b/l with palpable pedal pulses. Pedal hair sparse. CFT immediate b/l. No edema. No pain with calf compression b/l. Skin temperature gradient WNL b/l. Nonpitting edema noted BLE.  Neurological Examination: Sensation grossly intact b/l with 10 gram monofilament. Vibratory sensation intact b/l. Pt has subjective symptoms of neuropathy.  Dermatological Examination: Pedal skin with normal turgor, texture and tone b/l. Toenails right great toe and 2-5 b/l thick, discolored, elongated with subungual debris and pain on dorsal palpation. No hyperkeratotic lesions noted b/l. Evidence of total matrixectomy left great toe.  Musculoskeletal Examination: Muscle strength 5/5 to b/l LE. No pain, crepitus or joint limitation noted with ROM bilateral LE. No gross bony deformities bilaterally.  Radiographs: None  Assessment/Plan: 1. Pain due to onychomycosis of toenails of both feet   2. Diabetic peripheral neuropathy associated with type 2 diabetes mellitus (HCC)     Patient was evaluated and treated. All patient's and/or POA's questions/concerns addressed on today's visit. Toenails right great toe and 2-5 b/l debrided in length and girth without incident. Continue foot and shoe inspections daily. Monitor blood  glucose per PCP/Endocrinologist's recommendations. Continue soft, supportive shoe gear daily. Report any pedal injuries to medical professional. Call office if there are any questions/concerns.  Return in about 3 months (around 12/18/2024).  Delon LITTIE Merlin, DPM      [place] LOCATION: [address], [place] [address]                   Office [phone]   [facility] LOCATION: [address] [place], [place] [address] Office [phone]      [1]  Allergies Allergen Reactions   Penicillins Hives    Patient reported

## 2024-10-05 ENCOUNTER — Other Ambulatory Visit: Payer: Self-pay | Admitting: Family Medicine

## 2024-10-05 DIAGNOSIS — Z1231 Encounter for screening mammogram for malignant neoplasm of breast: Secondary | ICD-10-CM

## 2024-10-10 ENCOUNTER — Inpatient Hospital Stay
Admission: RE | Admit: 2024-10-10 | Discharge: 2024-10-10 | Disposition: A | Discharge status: Home / Self Care | Payer: Self-pay | Source: Ambulatory Visit | Attending: Family Medicine | Admitting: Family Medicine

## 2024-10-10 ENCOUNTER — Encounter: Payer: Self-pay | Admitting: Internal Medicine

## 2024-10-10 ENCOUNTER — Other Ambulatory Visit: Payer: Self-pay | Admitting: *Deleted

## 2024-10-10 DIAGNOSIS — Z1231 Encounter for screening mammogram for malignant neoplasm of breast: Secondary | ICD-10-CM

## 2024-10-19 NOTE — Progress Notes (Unsigned)
 "  Referring Physician:  Donal Channing SQUIBB, FNP [address] [place],  [place] [address]  Primary Physician:  Donal Channing SQUIBB, FNP  History of Present Illness: Lynn Potter has a history of breast CA, HTN, afib, CAD, malignant carcinoid tumor of duodenum, DM, chronic pain, hyperlipidemia, OSA, and failed back surgical syndrome.   History of lumbar surgery in 2002 and lumbar fusion in 2006. She did see improvement after this for a few years.   Has seen pain management in the past [place]). She's had short term relief with previous injections (up to a month).   Last seen by me on 07/04/24 for LBP and bilateral leg pain.   She is fused L4-S1. She has mild central stenosis T11-T12 with moderate/severe left foraminal stenosis, retrolisthesis L1-L2 with mild central stenosis and moderate bilateral foraminal stenosis, moderate central stenosis L2-L3, slip L3-L4 with severe central stenosis along with lateral recess stenosis and severe left/moderate right foraminal stenosis.   She was to continue with HEP from PT. She was not interested in further injections. She was referred to [facility] (unable to get to [facility]).   She is here for follow up.         She declined injections and further surgery. She was sent to PT- had initial eval on 05/12/24 and was discharged 07/01/24. She was also sent to [facility] for medical management.   She is here for follow up.   She has seen about 50% improvement in her pain with PT. She was discharged on Friday. She was never contacted by [facility].   She still has constant LBP with bilateral leg pain that is more medial and lateral to her feet. LBP = leg pain, left leg pain = right leg pain. She has numbness, tingling, and weakness in both legs. Pain is worse with walking. She feels like she is about to walk farther since doing PT.   She is taking tylenol , cymbalta , neurontin .   She smokes 6-7  cigarettes per day x 60 years. She has cut down.   Conservative measures:  Physical therapy: [facility] initial eval on 05/12/24 and was discharged 07/01/24 Multimodal medical therapy including regular antiinflammatories: tylenol , cymbalta , neurontin   Injections:  Left L3 TF and right L2-L3 ESI 09/25/20 Left L3 TF and right L2-L3 ESI 09/05/20 Right L2-L3 IL ESI 08/21/20 Left L3 TF and right L2-L3 ESI on 07/26/20  Past Surgery:  Lumbar fusion 2006 Lumbar surgery 2002  Lynn Potter has \\no  symptoms of cervical myelopathy.  The symptoms are causing a significant impact on the patient's life.   Review of Systems:  A 10 point review of systems is negative, except for the pertinent positives and negatives detailed in the HPI.  Past Medical History: Past Medical History:  Diagnosis Date   AF (atrial fibrillation) (HCC)    a.) CHA2DS2-VASc: 20 (age x2, sex, HTN, vascular disease history, T2DM); b.) rate/rhythm maintained intrinsically without pharmacological intervention; no chronic oral anticoagulation   Aortic atherosclerosis    Arthritis    Asthma    B12 deficiency    Bilateral carpal tunnel syndrome    CAD (coronary artery disease)    Cerebral microvascular disease    Chicken pox    Clotting disorder    COPD (chronic obstructive pulmonary disease) (HCC)    DDD (degenerative disc disease), lumbosacral    a.) s/p L4-S1 fusion in 2006   Depression    Diabetic peripheral neuropathy (HCC)  Diastolic dysfunction 06/06/2022   a.) TTE 06/06/2022: EF >55%, no RWMAs, mild LVH, triv MR/TR, mild AR, RVSP 20.5, G1DD   Diverticulosis    DOE (dyspnea on exertion)    GERD (gastroesophageal reflux disease)    Glaucoma    Hepatic steatosis    HH (hiatus hernia)    History of kidney stones    HLD (hyperlipidemia)    Hypertension    IDA (iron  deficiency anemia)    Insomnia    a.) uses trazodone  PRN   Internal hemorrhoids 12/25/2014   LAFB (left anterior fascicular block)    Long term  current use of aspirin     Lumbar spinal stenosis    Migraines    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   a.) Bx 10/18/2020 - spindle cell neoplasm; b.) s/p partial mastectomy 12/06/2020 --> final pathology revealed estrogen receptor (+) myofibroblastoma   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; well differentiated --> s/p bowel resection   Obesity    OSA (obstructive sleep apnea)    a.) non-compliant with prescribed nocturnal PAP therapy   Personal history of tobacco use, presenting hazards to health 06/18/2015   RLS (restless legs syndrome)    a.) on ropinirole    Status post carpal tunnel release    T2DM (type 2 diabetes mellitus) (HCC)    Tubular adenoma of colon     Past Surgical History: Past Surgical History:  Procedure Laterality Date   ABDOMINAL HYSTERECTOMY N/A 1982   APPENDECTOMY     BACK SURGERY  2002   CARPAL TUNNEL RELEASE     CATARACT EXTRACTION  2006   CHOLECYSTECTOMY     COLONOSCOPY WITH ESOPHAGOGASTRODUODENOSCOPY (EGD)     COLONOSCOPY WITH PROPOFOL  N/A 08/03/2019   Procedure: COLONOSCOPY WITH PROPOFOL ;  Surgeon: Toledo, Ladell POUR, MD;  Location: [facility] ENDOSCOPY;  Service: Gastroenterology;  Laterality: N/A;   ESOPHAGOGASTRODUODENOSCOPY (EGD) WITH PROPOFOL  N/A 02/16/2017   Procedure: ESOPHAGOGASTRODUODENOSCOPY (EGD) WITH PROPOFOL ;  Surgeon: Viktoria Lamar DASEN, MD;  Location: [facility] ENDOSCOPY;  Service: Endoscopy;  Laterality: N/A;   ESOPHAGOGASTRODUODENOSCOPY (EGD) WITH PROPOFOL  N/A 08/03/2019   Procedure: ESOPHAGOGASTRODUODENOSCOPY (EGD) WITH PROPOFOL ;  Surgeon: Toledo, Ladell POUR, MD;  Location: [facility] ENDOSCOPY;  Service: Gastroenterology;  Laterality: N/A;   LUMBAR FUSION  2006   MASTECTOMY PARTIAL / LUMPECTOMY Right 12/06/2020   REPAIR ROTATOR CUFF TEAR     TOTAL HIP ARTHROPLASTY Left 06/04/2023   Procedure: TOTAL HIP ARTHROPLASTY ANTERIOR APPROACH;  Surgeon: Lorelle Hussar, MD;  Location: [facility] ORS;  Service: Orthopedics;   Laterality: Left;   TOTAL KNEE ARTHROPLASTY Bilateral 1999   UPPER ESOPHAGEAL ENDOSCOPIC ULTRASOUND (EUS) N/A 01/03/2016   Procedure: UPPER ESOPHAGEAL ENDOSCOPIC ULTRASOUND (EUS);  Surgeon: Asberry DELENA Coffee, MD;  Location: [facility] ENDOSCOPY;  Service: Gastroenterology;  Laterality: N/A;    Allergies: Allergies as of 10/20/2024 - Review Complete 09/19/2024  Allergen Reaction Noted   Penicillins Hives 10/29/2023    Medications: Outpatient Encounter Medications as of 10/20/2024  Medication Sig   ACCU-CHEK AVIVA PLUS test strip 2 (two) times daily.   ACCU-CHEK AVIVA PLUS test strip 2 (two) times daily.   acetaminophen  (TYLENOL ) 500 MG tablet Take 2 tablets (1,000 mg total) by mouth every 8 (eight) hours.   Ascorbic Acid  (VITAMIN C ) 1000 MG tablet Take 1,000 mg by mouth every morning.   aspirin  81 MG tablet Take 81 mg by mouth daily.   atorvastatin  (LIPITOR) 40 MG tablet Take 40 mg by mouth daily.   calcium  carbonate (OSCAL) 1500 (600  Ca) MG TABS tablet Take 600 mg of elemental calcium  by mouth daily with breakfast.   COMBIVENT RESPIMAT 20-100 MCG/ACT AERS respimat USE 1 VIAL VIA NEBULIZER 4 TIMES A DAY AS NEEDED   diclofenac  Sodium (VOLTAREN ) 1 % GEL Apply 2 g topically every 8 (eight) hours as needed. Right hand   diphenhydrAMINE  (BENADRYL ) 25 mg capsule Take 1 capsule (25 mg total) by mouth every 6 (six) hours as needed for itching.   Fe Bisgly-Vit C-Vit B12-FA 28-60-0.008-0.4 MG CAPS Take 1 tablet by mouth daily. If stomach upset recommend taking with food.   feeding supplement (ENSURE ENLIVE / ENSURE PLUS) LIQD Take 237 mLs by mouth 2 (two) times daily between meals.   gabapentin  (NEURONTIN ) 300 MG capsule Take 600 mg by mouth 3 (three) times daily.   insulin  lispro (HUMALOG) 100 UNIT/ML KwikPen Inject 3 Units into the skin 3 (three) times daily.    JARDIANCE 25 MG TABS tablet Take 25 mg by mouth daily.    latanoprost  (XALATAN ) 0.005 % ophthalmic solution Place 1 drop into both eyes at  bedtime.    LEVEMIR  FLEXTOUCH 100 UNIT/ML Pen Inject 25 Units into the skin daily. Patient reports she only takes Levemir  if her glucose is >150 mg/dl which is rare for her   metFORMIN  (GLUCOPHAGE ) 500 MG tablet Take 500 mg by mouth 2 (two) times daily with a meal.   olmesartan -hydrochlorothiazide  (BENICAR  HCT) 40-25 MG tablet Take 1 tablet by mouth daily.   Omega-3 Fatty Acids (FISH OIL) 1000 MG CAPS Take by mouth daily.   pantoprazole  (PROTONIX ) 40 MG tablet Take 1 tablet by mouth daily.   potassium chloride  SA (KLOR-CON  M) 20 MEQ tablet TAKE 1 TABLET BY MOUTH EVERY DAY   rOPINIRole  (REQUIP ) 1 MG tablet TAKE 1 TABLET BY MOUTH EVERYDAY AT BEDTIME   traZODone  (DESYREL ) 50 MG tablet    vitamin B-12 1000 MCG tablet Take 1 tablet (1,000 mcg total) by mouth daily.   No facility-administered encounter medications on file as of 10/20/2024.    Social History: Social History   Tobacco Use   Smoking status: Every Day    Current packs/day: 0.40    Types: Cigarettes   Smokeless tobacco: Never  Vaping Use   Vaping status: Never Used  Substance Use Topics   Alcohol use: No   Drug use: No    Family Medical History: Family History  Problem Relation Age of Onset   Diabetes Mother    Diabetes Sister     Physical Examination: There were no vitals filed for this visit.     Awake, alert, oriented to person, place, and time.  Speech is clear and fluent. Fund of knowledge is appropriate.   Cranial Nerves: Pupils equal round and reactive to light.  Facial tone is symmetric.    No lower posterior lumbar tenderness.   No abnormal lesions on exposed skin.   Strength: Side Iliopsoas Quads Hamstring PF DF EHL  R 5 5 5 5 5 5   L 5 5 5 5 5 5    Reflexes are 1+ and symmetric at the patella and achilles.    Clonus is not present.   Bilateral lower extremity sensation is intact to light touch.     No pain with IR/ER of both hips.   She uses a rollator.   Medical Decision  Making  Imaging: none  Assessment and Plan: Lynn Potter has history of lumbar surgery x 2 with fusion in 2006. She did well for a few years then  pain started.   She has seen about 50% improvement in her pain with PT. She still has constant LBP with bilateral leg pain that is more medial and lateral to her feet. LBP = leg pain, left leg pain = right leg pain.    She is fused L4-S1. She has mild central stenosis T11-T12 with moderate/severe left foraminal stenosis, retrolisthesis L1-L2 with mild central stenosis and moderate bilateral foraminal stenosis, moderate central stenosis L2-L3, slip L3-L4 with severe central stenosis along with lateral recess stenosis and severe left/moderate right foraminal stenosis.    Treatment options discussed with patient and following plan made:  - Continue with HEP from PT. Can revisit if needed [facility] in [place]).  - She does not want to repeat injections or consider any surgery options for her lumbar spine.  - Referral to [facility] to discuss medical management of chronic pain done but they are not taking new patients. Unable to travel to [place].  - Follow up with me 3-4 months for recheck.    I spent a total of 20 minutes in face-to-face and non-face-to-face activities related to this patient's care today including review of outside records, review of imaging, review of symptoms, physical exam, discussion of differential diagnosis, discussion of treatment options, and documentation.   Glade Boys PA-C Dept. of Neurosurgery "

## 2024-10-20 ENCOUNTER — Encounter: Payer: Self-pay | Admitting: Internal Medicine

## 2024-10-20 ENCOUNTER — Ambulatory Visit: Admitting: Orthopedic Surgery

## 2024-11-02 ENCOUNTER — Ambulatory Visit
Admission: RE | Admit: 2024-11-02 | Discharge: 2024-11-02 | Disposition: A | Discharge status: Home / Self Care | Source: Ambulatory Visit | Attending: Family Medicine | Admitting: Family Medicine

## 2024-11-02 DIAGNOSIS — Z1231 Encounter for screening mammogram for malignant neoplasm of breast: Secondary | ICD-10-CM | POA: Insufficient documentation

## 2024-12-07 ENCOUNTER — Ambulatory Visit: Admitting: Internal Medicine

## 2024-12-07 ENCOUNTER — Ambulatory Visit

## 2024-12-07 ENCOUNTER — Other Ambulatory Visit

## 2024-12-22 ENCOUNTER — Ambulatory Visit: Admitting: Podiatry
